# Patient Record
Sex: Female | Born: 1965
Health system: Southern US, Community
[De-identification: ages and names within clinical notes are randomized; demographics above are authoritative.]

## PROBLEM LIST (undated history)

## (undated) DIAGNOSIS — F419 Anxiety disorder, unspecified: Secondary | ICD-10-CM

## (undated) DIAGNOSIS — G43909 Migraine, unspecified, not intractable, without status migrainosus: Secondary | ICD-10-CM

## (undated) DIAGNOSIS — F32A Depression, unspecified: Secondary | ICD-10-CM

## (undated) DIAGNOSIS — F329 Major depressive disorder, single episode, unspecified: Secondary | ICD-10-CM

## (undated) DIAGNOSIS — M419 Scoliosis, unspecified: Secondary | ICD-10-CM

## (undated) DIAGNOSIS — M1990 Unspecified osteoarthritis, unspecified site: Secondary | ICD-10-CM

## (undated) DIAGNOSIS — N951 Menopausal and female climacteric states: Secondary | ICD-10-CM

## (undated) HISTORY — DX: Depression, unspecified: F32.A

## (undated) HISTORY — DX: Menopausal and female climacteric states: N95.1

## (undated) HISTORY — DX: Migraine, unspecified, not intractable, without status migrainosus: G43.909

## (undated) HISTORY — DX: Scoliosis, unspecified: M41.9

## (undated) HISTORY — DX: Anxiety disorder, unspecified: F41.9

## (undated) HISTORY — DX: Major depressive disorder, single episode, unspecified: F32.9

## (undated) HISTORY — PX: KNEE SURGERY: SHX244

## (undated) HISTORY — DX: Unspecified osteoarthritis, unspecified site: M19.90

---

## 2011-07-16 ENCOUNTER — Other Ambulatory Visit: Payer: Self-pay | Admitting: Family Medicine

## 2011-07-23 ENCOUNTER — Other Ambulatory Visit: Payer: Self-pay

## 2011-07-30 ENCOUNTER — Other Ambulatory Visit: Payer: Self-pay

## 2011-07-30 ENCOUNTER — Ambulatory Visit
Admission: RE | Admit: 2011-07-30 | Discharge: 2011-07-30 | Disposition: A | Discharge status: Home / Self Care | Payer: 59 | Source: Ambulatory Visit | Attending: Family Medicine | Admitting: Family Medicine

## 2011-08-06 ENCOUNTER — Ambulatory Visit
Admission: RE | Admit: 2011-08-06 | Discharge: 2011-08-06 | Disposition: A | Discharge status: Home / Self Care | Payer: 59 | Source: Ambulatory Visit | Attending: Family Medicine | Admitting: Family Medicine

## 2011-08-06 ENCOUNTER — Ambulatory Visit: Admission: RE | Admit: 2011-08-06 | Payer: 59 | Source: Ambulatory Visit

## 2013-03-13 ENCOUNTER — Telehealth: Payer: Self-pay | Admitting: Nurse Practitioner

## 2013-03-13 NOTE — Telephone Encounter (Signed)
Patient would like an appointment for a pap smear with Paulene Floor in the morning.

## 2013-03-14 NOTE — Telephone Encounter (Signed)
APPT MADE

## 2013-03-20 ENCOUNTER — Other Ambulatory Visit: Payer: Self-pay | Admitting: Physician Assistant

## 2013-05-17 ENCOUNTER — Other Ambulatory Visit: Payer: Self-pay | Admitting: General Practice

## 2013-05-18 NOTE — Telephone Encounter (Signed)
Last seen 02-28-13

## 2013-05-21 ENCOUNTER — Telehealth: Payer: Self-pay | Admitting: Family Medicine

## 2013-05-21 ENCOUNTER — Telehealth: Payer: Self-pay | Admitting: Nurse Practitioner

## 2013-05-21 MED ORDER — FLUCONAZOLE 150 MG PO TABS: 150.0000 mg | ORAL_TABLET | Freq: Every day | ORAL | Status: DC

## 2013-05-21 MED ORDER — IBUPROFEN 800 MG PO TABS: 800.0000 mg | ORAL_TABLET | Freq: Two times a day (BID) | ORAL | Status: DC | PRN

## 2013-05-21 NOTE — Telephone Encounter (Signed)
rx sent to pharmacy

## 2013-05-21 NOTE — Telephone Encounter (Signed)
Please call in Fluconazole and IBP 800 mg called into [facility] pharmacy

## 2013-05-21 NOTE — Telephone Encounter (Signed)
rx called into pharmacy

## 2013-05-21 NOTE — Telephone Encounter (Signed)
Patient wants diflucan and IBProfen 800mg  called into [facility] pharmacy

## 2013-06-19 ENCOUNTER — Encounter: Payer: Self-pay | Admitting: General Practice

## 2013-06-19 ENCOUNTER — Telehealth: Payer: Self-pay | Admitting: Nurse Practitioner

## 2013-06-19 ENCOUNTER — Ambulatory Visit (INDEPENDENT_AMBULATORY_CARE_PROVIDER_SITE_OTHER): Payer: Medicare Other | Admitting: General Practice

## 2013-06-19 ENCOUNTER — Ambulatory Visit (INDEPENDENT_AMBULATORY_CARE_PROVIDER_SITE_OTHER): Payer: Medicare Other

## 2013-06-19 ENCOUNTER — Telehealth: Payer: Self-pay | Admitting: Family Medicine

## 2013-06-19 DIAGNOSIS — N926 Irregular menstruation, unspecified: Secondary | ICD-10-CM

## 2013-06-19 DIAGNOSIS — R109 Unspecified abdominal pain: Secondary | ICD-10-CM | POA: Diagnosis not present

## 2013-06-19 DIAGNOSIS — K5900 Constipation, unspecified: Secondary | ICD-10-CM

## 2013-06-19 DIAGNOSIS — N390 Urinary tract infection, site not specified: Secondary | ICD-10-CM

## 2013-06-19 DIAGNOSIS — R110 Nausea: Secondary | ICD-10-CM

## 2013-06-19 LAB — POCT URINALYSIS DIPSTICK
Bilirubin, UA: NEGATIVE
Glucose, UA: NEGATIVE
Spec Grav, UA: 1.02
pH, UA: 6.5

## 2013-06-19 LAB — POCT UA - MICROSCOPIC ONLY
Bacteria, U Microscopic: NEGATIVE
Casts, Ur, LPF, POC: NEGATIVE
Crystals, Ur, HPF, POC: NEGATIVE

## 2013-06-19 MED ORDER — ONDANSETRON HCL 4 MG PO TABS: 4.0000 mg | ORAL_TABLET | Freq: Three times a day (TID) | ORAL | Status: DC | PRN

## 2013-06-19 MED ORDER — CIPROFLOXACIN HCL 500 MG PO TABS: 500.0000 mg | ORAL_TABLET | Freq: Two times a day (BID) | ORAL | Status: DC

## 2013-06-19 MED ORDER — NITROFURANTOIN MONOHYD MACRO 100 MG PO CAPS: 100.0000 mg | ORAL_CAPSULE | Freq: Two times a day (BID) | ORAL | Status: DC

## 2013-06-19 NOTE — Progress Notes (Signed)
  Subjective:    Patient ID: Sandra Adams, female    DOB: [birth year], 47 y.o.   MRN: [id number]  HPI Presents today with complain of right side of pelvic pain. She reports this pain onset was 2 to 3 years ago. She reports having a history of fibroids.  She reports being nauseated for past two months. She denies burning or stinging with urination. She denies vaginal discharge.     Review of Systems  Constitutional: Negative for fever and chills.  Respiratory: Negative for chest tightness and shortness of breath.   Cardiovascular: Negative for chest pain and palpitations.  Gastrointestinal: Positive for nausea. Negative for vomiting and blood in stool.  Genitourinary: Negative for dysuria, hematuria and difficulty urinating.  Neurological: Negative for dizziness, weakness and headaches.       Objective:   Physical Exam  Constitutional: She is oriented to person, place, and time. She appears well-developed and well-nourished.  HENT:  Head: Normocephalic and atraumatic.  Cardiovascular: Normal rate, regular rhythm and normal heart sounds.   Pulmonary/Chest: Effort normal and breath sounds normal. No respiratory distress. She exhibits no tenderness.  Abdominal: Soft. Bowel sounds are normal. There is tenderness in the right upper quadrant and right lower quadrant.  Neurological: She is alert and oriented to person, place, and time.  Skin: Skin is warm and dry.  Psychiatric: She has a normal mood and affect.   Results for orders placed in visit on 06/19/13  POCT URINALYSIS DIPSTICK      Result Value Range   Color, UA yellow     Clarity, UA clear     Glucose, UA neg     Bilirubin, UA neg     Ketones, UA neg     Spec Grav, UA 1.020     Blood, UA trace     pH, UA 6.5     Protein, UA trace     Urobilinogen, UA negative     Nitrite, UA neg     Leukocytes, UA Trace    POCT UA - MICROSCOPIC ONLY      Result Value Range   WBC, Ur, HPF, POC 5-10     RBC, urine, microscopic 1-5     Bacteria, U Microscopic neg     Mucus, UA occ     Epithelial cells, urine per micros occ     Crystals, Ur, HPF, POC neg     Casts, Ur, LPF, POC neg     Yeast, UA rare    [facility] reading (PRIMARY) by Coralie Keens, FNP-C, large amount of stool noted in colon.                                        Assessment & Plan:  1. Abdominal  pain, other specified site - POCT urinalysis dipstick - POCT UA - Microscopic Only - DG Abd 1 View; Future  2. Abnormal menses - POCT urine pregnancy  3. Unspecified constipation Miralax 17grams daily, for 1-4 days, until bowel movement  Increase fluid intake (water) Increase fiber in diet (fruits, vegetables, whole grains) Take stool softner daily   4. UTI (urinary tract infection) Increase fluid intake AZO over the counter X2 days Frequent voiding Proper perineal hygiene RTO if symptoms worsen or unresolved Patient verbalized understanding Coralie Keens, FNP-C

## 2013-06-19 NOTE — Telephone Encounter (Signed)
appt made

## 2013-06-19 NOTE — Patient Instructions (Addendum)
Constipation, Adult Constipation is when a person has fewer than 3 bowel movements a week; has difficulty having a bowel movement; or has stools that are dry, hard, or larger than normal. As people grow older, constipation is more common. If you try to fix constipation with medicines that make you have a bowel movement (laxatives), the problem may get worse. Long-term laxative use may cause the muscles of the colon to become weak. A low-fiber diet, not taking in enough fluids, and taking certain medicines may make constipation worse. CAUSES   Certain medicines, such as antidepressants, pain medicine, iron supplements, antacids, and water pills.   Certain diseases, such as diabetes, irritable bowel syndrome (IBS), thyroid disease, or depression.   Not drinking enough water.   Not eating enough fiber-rich foods.   Stress or travel.  Lack of physical activity or exercise.  Not going to the restroom when there is the urge to have a bowel movement.  Ignoring the urge to have a bowel movement.  Using laxatives too much. SYMPTOMS   Having fewer than 3 bowel movements a week.   Straining to have a bowel movement.   Having hard, dry, or larger than normal stools.   Feeling full or bloated.   Pain in the lower abdomen.  Not feeling relief after having a bowel movement. DIAGNOSIS  Your caregiver will take a medical history and perform a physical exam. Further testing may be done for severe constipation. Some tests may include:   A barium enema X-ray to examine your rectum, colon, and sometimes, your small intestine.  A sigmoidoscopy to examine your lower colon.  A colonoscopy to examine your entire colon. TREATMENT  Treatment will depend on the severity of your constipation and what is causing it. Some dietary treatments include drinking more fluids and eating more fiber-rich foods. Lifestyle treatments may include regular exercise. If these diet and lifestyle recommendations  do not help, your caregiver may recommend taking over-the-counter laxative medicines to help you have bowel movements. Prescription medicines may be prescribed if over-the-counter medicines do not work.  HOME CARE INSTRUCTIONS   Increase dietary fiber in your diet, such as fruits, vegetables, whole grains, and beans. Limit high-fat and processed sugars in your diet, such as [place] fries, hamburgers, cookies, candies, and soda.   A fiber supplement may be added to your diet if you cannot get enough fiber from foods.   Drink enough fluids to keep your urine clear or pale yellow.   Exercise regularly or as directed by your caregiver.   Go to the restroom when you have the urge to go. Do not hold it.  Only take medicines as directed by your caregiver. Do not take other medicines for constipation without talking to your caregiver first. SEEK IMMEDIATE MEDICAL CARE IF:   You have bright red blood in your stool.   Your constipation lasts for more than 4 days or gets worse.   You have abdominal or rectal pain.   You have thin, pencil-like stools.  You have unexplained weight loss. MAKE SURE YOU:   Understand these instructions.  Will watch your condition.  Will get help right away if you are not doing well or get worse. Document Released: 09/03/2004 Document Revised: 02/28/2012 Document Reviewed: 11/09/2011 [facility] Patient Information 2014 [place], [place]. Urinary Tract Infection Urinary tract infections (UTIs) can develop anywhere along your urinary tract. Your urinary tract is your body's drainage system for removing wastes and extra water. Your urinary tract includes two kidneys, two ureters,  a bladder, and a urethra. Your kidneys are a pair of bean-shaped organs. Each kidney is about the size of your fist. They are located below your ribs, one on each side of your spine. CAUSES Infections are caused by microbes, which are microscopic organisms, including fungi, viruses, and  bacteria. These organisms are so small that they can only be seen through a microscope. Bacteria are the microbes that most commonly cause UTIs. SYMPTOMS  Symptoms of UTIs may vary by age and gender of the patient and by the location of the infection. Symptoms in young women typically include a frequent and intense urge to urinate and a painful, burning feeling in the bladder or urethra during urination. Older women and men are more likely to be tired, shaky, and weak and have muscle aches and abdominal pain. A fever may mean the infection is in your kidneys. Other symptoms of a kidney infection include pain in your back or sides below the ribs, nausea, and vomiting. DIAGNOSIS To diagnose a UTI, your caregiver will ask you about your symptoms. Your caregiver also will ask to provide a urine sample. The urine sample will be tested for bacteria and white blood cells. White blood cells are made by your body to help fight infection. TREATMENT  Typically, UTIs can be treated with medication. Because most UTIs are caused by a bacterial infection, they usually can be treated with the use of antibiotics. The choice of antibiotic and length of treatment depend on your symptoms and the type of bacteria causing your infection. HOME CARE INSTRUCTIONS  If you were prescribed antibiotics, take them exactly as your caregiver instructs you. Finish the medication even if you feel better after you have only taken some of the medication.  Drink enough water and fluids to keep your urine clear or pale yellow.  Avoid caffeine, tea, and carbonated beverages. They tend to irritate your bladder.  Empty your bladder often. Avoid holding urine for long periods of time.  Empty your bladder before and after sexual intercourse.  After a bowel movement, women should cleanse from front to back. Use each tissue only once. SEEK MEDICAL CARE IF:   You have back pain.  You develop a fever.  Your symptoms do not begin to  resolve within 3 days. SEEK IMMEDIATE MEDICAL CARE IF:   You have severe back pain or lower abdominal pain.  You develop chills.  You have nausea or vomiting.  You have continued burning or discomfort with urination. MAKE SURE YOU:   Understand these instructions.  Will watch your condition.  Will get help right away if you are not doing well or get worse. Document Released: 09/15/2005 Document Revised: 06/06/2012 Document Reviewed: 01/14/2012 [facility] Patient Information 2014 [place], [place].  Miralax 17grams daily, for 1-4 days, until bowel movement  Increase fluid intake (water) Increase fiber in diet (fruits, vegetables, whole grains) Take stool softner daily

## 2013-07-05 ENCOUNTER — Ambulatory Visit: Payer: Medicare Other | Admitting: General Practice

## 2013-07-11 ENCOUNTER — Other Ambulatory Visit: Payer: Self-pay

## 2013-07-11 NOTE — Telephone Encounter (Signed)
Seen 06/19/13  [name]  Patient requesting 2 tablets

## 2013-07-11 NOTE — Telephone Encounter (Signed)
Last seen 06/19/13  Sandra Adams  If approved route to nurse to call in and notify patient

## 2013-07-12 MED ORDER — ALPRAZOLAM 0.25 MG PO TABS: 0.2500 mg | ORAL_TABLET | Freq: Two times a day (BID) | ORAL | Status: DC | PRN

## 2013-07-12 NOTE — Telephone Encounter (Signed)
Please call into pharmacy. thx 

## 2013-07-13 ENCOUNTER — Other Ambulatory Visit: Payer: Self-pay | Admitting: *Deleted

## 2013-07-13 MED ORDER — FLUCONAZOLE 150 MG PO TABS: 150.0000 mg | ORAL_TABLET | Freq: Every day | ORAL | Status: DC

## 2013-07-13 MED ORDER — ALPRAZOLAM 0.25 MG PO TABS: 0.2500 mg | ORAL_TABLET | Freq: Two times a day (BID) | ORAL | Status: DC | PRN

## 2013-07-13 NOTE — Telephone Encounter (Signed)
Patient last seen in office on 7-11. Alprazolam last filled on 09-25-12. Please advise. If approved please have nurse call in to [facility] pharmacy.

## 2013-07-13 NOTE — Telephone Encounter (Signed)
This was done by RX nurse.

## 2013-07-19 ENCOUNTER — Ambulatory Visit: Payer: Medicare Other | Admitting: General Practice

## 2013-09-10 ENCOUNTER — Ambulatory Visit (INDEPENDENT_AMBULATORY_CARE_PROVIDER_SITE_OTHER): Payer: Medicare Other | Admitting: Nurse Practitioner

## 2013-09-10 VITALS — BP 111/76 | HR 78 | Temp 98.3°F | Ht 62.0 in | Wt 130.0 lb

## 2013-09-10 DIAGNOSIS — K5900 Constipation, unspecified: Secondary | ICD-10-CM

## 2013-09-10 DIAGNOSIS — Z01419 Encounter for gynecological examination (general) (routine) without abnormal findings: Secondary | ICD-10-CM

## 2013-09-10 DIAGNOSIS — Z0000 Encounter for general adult medical examination without abnormal findings: Secondary | ICD-10-CM

## 2013-09-10 DIAGNOSIS — Z124 Encounter for screening for malignant neoplasm of cervix: Secondary | ICD-10-CM

## 2013-09-10 LAB — POCT URINALYSIS DIPSTICK
Bilirubin, UA: NEGATIVE
Glucose, UA: NEGATIVE
Ketones, UA: NEGATIVE
Protein, UA: NEGATIVE

## 2013-09-10 LAB — POCT UA - MICROSCOPIC ONLY

## 2013-09-10 NOTE — Patient Instructions (Addendum)
Fibroids Fibroids are lumps (tumors) that can occur any place in a woman's body. These lumps are not cancerous. Fibroids vary in size, weight, and where they grow. HOME CARE  Do not take aspirin.  Write down the number of pads or tampons you use during your period. Tell your doctor. This can help determine the best treatment for you. GET HELP RIGHT AWAY IF:  You have pain in your lower belly (abdomen) that is not helped with medicine.  You have cramps that are not helped with medicine.  You have more bleeding between or during your period.  You feel lightheaded or pass out (faint).  Your lower belly pain gets worse. MAKE SURE YOU:  Understand these instructions.  Will watch your condition.  Will get help right away if you are not doing well or get worse. Document Released: 01/08/2011 Document Revised: 02/28/2012 Document Reviewed: 01/08/2011 [employer] Patient Information 2014 [employer].  

## 2013-09-10 NOTE — Progress Notes (Signed)
Subjective:    Patient ID: Sandra Adams, female    DOB: 1966-06-14, 47 y.o.   MRN: [id number]  HPI Patient here today for CPE and PAP- She is doing well andhas no complaints today.    Review of Systems  Constitutional: Negative.   HENT: Negative.   Eyes: Negative.   Respiratory: Negative.   Cardiovascular: Negative.   Gastrointestinal: Negative.   Endocrine: Negative.   Genitourinary: Negative.   Musculoskeletal: Negative.   Allergic/Immunologic: Negative.   Neurological: Negative.   Hematological: Negative.   Psychiatric/Behavioral: Negative.        Objective:   Physical Exam  Constitutional: She is oriented to person, place, and time. She appears well-developed and well-nourished.  HENT:  Head: Normocephalic.  Right Ear: Hearing, tympanic membrane, external ear and ear canal normal.  Left Ear: Hearing, tympanic membrane, external ear and ear canal normal.  Nose: Nose normal.  Mouth/Throat: Uvula is midline and oropharynx is clear and moist.  Eyes: Conjunctivae and EOM are normal. Pupils are equal, round, and reactive to light.  Neck: Normal range of motion and full passive range of motion without pain. Neck supple. No JVD present. Carotid bruit is not present. No mass and no thyromegaly present.  Cardiovascular: Normal rate, normal heart sounds and intact distal pulses.   No murmur heard. Pulmonary/Chest: Effort normal and breath sounds normal. Right breast exhibits no inverted nipple, no mass, no nipple discharge, no skin change and no tenderness. Left breast exhibits no inverted nipple, no mass, no nipple discharge, no skin change and no tenderness. Breasts are symmetrical.  Abdominal: Soft. Bowel sounds are normal. She exhibits no mass. There is no tenderness.  Dull to percussion  Genitourinary: Vagina normal and uterus normal. No breast swelling, tenderness, discharge or bleeding.  bimanual exam-Palpable ant fibroid no tenderness.  Cervix parous and pink   Musculoskeletal: Normal range of motion.  Lymphadenopathy:    She has no cervical adenopathy.  Neurological: She is alert and oriented to person, place, and time.  Skin: Skin is warm and dry.  Psychiatric: She has a normal mood and affect. Her behavior is normal. Judgment and thought content normal.     BP 111/76  Pulse 78  Temp(Src) 98.3 F (36.8 C) (Oral)  Ht 5\' 2"  (1.575 m)  Wt 130 lb (58.968 kg)  BMI 23.77 kg/m2  Results for orders placed in visit on 09/10/13  POCT UA - MICROSCOPIC ONLY      Result Value Range   WBC, Ur, HPF, POC 20-25     RBC, urine, microscopic 5-10     Bacteria, U Microscopic many     Mucus, UA neg     Epithelial cells, urine per micros occ     Crystals, Ur, HPF, POC neg     Casts, Ur, LPF, POC neg     Yeast, UA neg    POCT URINALYSIS DIPSTICK      Result Value Range   Color, UA yellow     Clarity, UA cloudy     Glucose, UA neg     Bilirubin, UA neg     Ketones, UA neg     Spec Grav, UA 1.025     Blood, UA mod     pH, UA 6.0     Protein, UA neg     Urobilinogen, UA negative     Nitrite, UA pos     Leukocytes, UA Trace          Assessment & Plan:  1.  Encounter for routine gynecological examination  - POCT UA - Microscopic Only - POCT urinalysis dipstick - POCT CBC - CMP14+EGFR - NMR, lipoprofile - Thyroid Panel With TSH  2. PAP (pulmonary alveolar proteinosis)  - Pap IG w/ reflex to HPV when ASC-U  3. Constipation miralx OTC daily Force fluids Increase fiber in diet  Mary-Margaret Daphine Deutscher, FNP

## 2013-09-12 LAB — PAP IG W/ RFLX HPV ASCU

## 2013-09-15 ENCOUNTER — Other Ambulatory Visit: Payer: Self-pay | Admitting: Nurse Practitioner

## 2013-09-15 MED ORDER — NITROFURANTOIN MONOHYD MACRO 100 MG PO CAPS: 100.0000 mg | ORAL_CAPSULE | Freq: Two times a day (BID) | ORAL | Status: DC

## 2013-09-17 ENCOUNTER — Telehealth: Payer: Self-pay

## 2013-09-17 DIAGNOSIS — D219 Benign neoplasm of connective and other soft tissue, unspecified: Secondary | ICD-10-CM

## 2013-09-17 NOTE — Telephone Encounter (Signed)
Wants a referral for a second opinion for her fibroids to [facility] center in [place]

## 2013-09-29 ENCOUNTER — Encounter: Payer: Self-pay | Admitting: General Practice

## 2013-09-29 ENCOUNTER — Ambulatory Visit (INDEPENDENT_AMBULATORY_CARE_PROVIDER_SITE_OTHER): Payer: Medicare Other | Admitting: General Practice

## 2013-09-29 VITALS — BP 118/74 | HR 88 | Temp 97.7°F | Ht 62.0 in | Wt 130.0 lb

## 2013-09-29 DIAGNOSIS — L0390 Cellulitis, unspecified: Secondary | ICD-10-CM

## 2013-09-29 DIAGNOSIS — R21 Rash and other nonspecific skin eruption: Secondary | ICD-10-CM

## 2013-09-29 DIAGNOSIS — L0291 Cutaneous abscess, unspecified: Secondary | ICD-10-CM

## 2013-09-29 MED ORDER — CEPHALEXIN 500 MG PO CAPS: 500.0000 mg | ORAL_CAPSULE | Freq: Three times a day (TID) | ORAL | Status: DC

## 2013-09-29 MED ORDER — TRIAMCINOLONE ACETONIDE 0.025 % EX CREA: TOPICAL_CREAM | Freq: Two times a day (BID) | CUTANEOUS | Status: DC

## 2013-09-29 NOTE — Patient Instructions (Signed)

## 2013-09-29 NOTE — Progress Notes (Signed)
  Subjective:    Patient ID: Sandra Adams, female    DOB: 07-19-1966, 47 y.o.   MRN: [id number]  Rash This is a new problem. The current episode started yesterday. The problem has been gradually worsening since onset. The affected locations include the chest. The rash is characterized by redness and itchiness. She was exposed to a new detergent/soap (? insect bite). Pertinent negatives include no congestion, cough, diarrhea, fatigue, fever, joint pain, shortness of breath, sore throat or vomiting. Past treatments include anti-itch cream. There is no history of allergies or asthma.      Review of Systems  Constitutional: Negative for fever, chills and fatigue.  HENT: Negative for congestion and sore throat.   Respiratory: Negative for cough, chest tightness and shortness of breath.   Cardiovascular: Negative for chest pain.  Gastrointestinal: Negative for vomiting and diarrhea.  Musculoskeletal: Negative for joint pain.  Skin: Positive for rash.       Red rash to right mid chest  Neurological: Negative for dizziness, weakness and headaches.       Objective:   Physical Exam  Constitutional: She is oriented to person, place, and time. She appears well-developed and well-nourished.  Cardiovascular: Normal rate, regular rhythm and normal heart sounds.   Pulmonary/Chest: Effort normal and breath sounds normal.  Neurological: She is alert and oriented to person, place, and time.  Skin: Skin is warm and dry. Rash noted.  Erythematous,maculopapular rash to right chest, underneath breast. 1/2 x 1 inch, negative for warmth  Psychiatric: She has a normal mood and affect.          Assessment & Plan:  1. Cellulitis  - cephALEXin (KEFLEX) 500 MG capsule; Take 1 capsule (500 mg total) by mouth 3 (three) times daily.  Dispense: 30 capsule; Refill: 0  2. Rash and nonspecific skin eruption  - triamcinolone (KENALOG) 0.025 % cream; Apply topically 2 (two) times daily.  Dispense: 30 g; Refill:  0 -keep area clean and dry -may take benadryl OTC as directed -RTO if symptoms worsen or unresolved -Patient verbalized understanding -Coralie Keens, FNP-C

## 2013-10-22 ENCOUNTER — Telehealth: Payer: Self-pay | Admitting: Nurse Practitioner

## 2013-10-22 NOTE — Telephone Encounter (Signed)
No I have not seen patient in while- last 2 visits were with [name]- Will need to be seen

## 2013-10-23 NOTE — Telephone Encounter (Signed)
Appt done

## 2013-10-24 ENCOUNTER — Encounter: Payer: Self-pay | Admitting: General Practice

## 2013-10-24 ENCOUNTER — Ambulatory Visit (INDEPENDENT_AMBULATORY_CARE_PROVIDER_SITE_OTHER): Payer: Medicare Other | Admitting: General Practice

## 2013-10-24 VITALS — BP 98/66 | HR 89 | Temp 98.9°F | Ht 62.0 in | Wt 133.0 lb

## 2013-10-24 DIAGNOSIS — J302 Other seasonal allergic rhinitis: Secondary | ICD-10-CM

## 2013-10-24 DIAGNOSIS — J309 Allergic rhinitis, unspecified: Secondary | ICD-10-CM

## 2013-10-24 MED ORDER — CETIRIZINE HCL 10 MG PO TABS: 10.0000 mg | ORAL_TABLET | Freq: Every day | ORAL | Status: DC

## 2013-10-24 NOTE — Progress Notes (Signed)
  Subjective:    Patient ID: Sandra Adams, female    DOB: 07/12/1966, 47 y.o.   MRN: [id number]  HPI Patient presents today with complaints of post nasal drip, headache, watery eyes, and sneezing at night. She denies OTC medications.     Review of Systems  Constitutional: Negative for fever and chills.  HENT: Positive for postnasal drip and sneezing.   Respiratory: Negative for chest tightness and shortness of breath.   Cardiovascular: Negative for chest pain and palpitations.  Neurological: Positive for headaches. Negative for dizziness and weakness.       Headache at times with sneezing and post nasal drip       Objective:   Physical Exam  Constitutional: She is oriented to person, place, and time. She appears well-developed and well-nourished.  HENT:  Head: Normocephalic and atraumatic.  Right Ear: External ear normal.  Left Ear: External ear normal.  Nose: Right sinus exhibits no maxillary sinus tenderness and no frontal sinus tenderness. Left sinus exhibits no maxillary sinus tenderness and no frontal sinus tenderness.  Mouth/Throat: Posterior oropharyngeal erythema present.  Cardiovascular: Normal rate, regular rhythm and normal heart sounds.   Pulmonary/Chest: Effort normal and breath sounds normal. No respiratory distress. She exhibits no tenderness.  Neurological: She is alert and oriented to person, place, and time.  Skin: Skin is warm and dry.  Psychiatric: She has a normal mood and affect.          Assessment & Plan:  1. Seasonal allergic rhinitis - cetirizine (ZYRTEC) 10 MG tablet; Take 1 tablet (10 mg total) by mouth daily.  Dispense: 30 tablet; Refill: 1 -increase fluids -take medication at night, may cause drowsiness -RTO if symptoms worsen or unresolved -Patient verbalized understanding -Coralie Keens, FNP-C

## 2013-10-24 NOTE — Patient Instructions (Signed)
Allergic Rhinitis Allergic rhinitis is when the mucous membranes in the nose respond to allergens. Allergens are particles in the air that cause your body to have an allergic reaction. This causes you to release allergic antibodies. Through a chain of events, these eventually cause you to release histamine into the blood stream (hence the use of antihistamines). Although meant to be protective to the body, it is this release that causes your discomfort, such as frequent sneezing, congestion and an itchy runny nose.  CAUSES  The pollen allergens may come from grasses, trees, and weeds. This is seasonal allergic rhinitis, or "hay fever." Other allergens cause year-round allergic rhinitis (perennial allergic rhinitis) such as house dust mite allergen, pet dander and mold spores.  SYMPTOMS   Nasal stuffiness (congestion).  Runny, itchy nose with sneezing and tearing of the eyes.  There is often an itching of the mouth, eyes and ears. It cannot be cured, but it can be controlled with medications. DIAGNOSIS  If you are unable to determine the offending allergen, skin or blood testing may find it. TREATMENT   Avoid the allergen.  Medications and allergy shots (immunotherapy) can help.  Hay fever may often be treated with antihistamines in pill or nasal spray forms. Antihistamines block the effects of histamine. There are over-the-counter medicines that may help with nasal congestion and swelling around the eyes. Check with your caregiver before taking or giving this medicine. If the treatment above does not work, there are many new medications your caregiver can prescribe. Stronger medications may be used if initial measures are ineffective. Desensitizing injections can be used if medications and avoidance fails. Desensitization is when a patient is given ongoing shots until the body becomes less sensitive to the allergen. Make sure you follow up with your caregiver if problems continue. SEEK MEDICAL  CARE IF:   You develop fever (more than 100.5 F (38.1 C).  You develop a cough that does not stop easily (persistent).  You have shortness of breath.  You start wheezing.  Symptoms interfere with normal daily activities. Document Released: 08/31/2001 Document Revised: 02/28/2012 Document Reviewed: 03/12/2009 [employer] Patient Information 2014 [employer].  

## 2013-11-09 ENCOUNTER — Other Ambulatory Visit: Payer: Self-pay | Admitting: Family Medicine

## 2013-11-09 ENCOUNTER — Other Ambulatory Visit: Payer: Self-pay | Admitting: Nurse Practitioner

## 2013-11-09 ENCOUNTER — Other Ambulatory Visit: Payer: Self-pay | Admitting: General Practice

## 2013-11-12 NOTE — Telephone Encounter (Signed)
Last seen 11/5   Sandra Adams

## 2013-11-12 NOTE — Telephone Encounter (Signed)
Last seen 10/24/13  [name]

## 2013-11-12 NOTE — Telephone Encounter (Signed)
Last seen 10/24/13  [name]  If approved route to nurse to call into [facility]

## 2013-11-13 ENCOUNTER — Other Ambulatory Visit: Payer: Self-pay | Admitting: General Practice

## 2013-11-14 ENCOUNTER — Encounter: Payer: Self-pay | Admitting: General Practice

## 2013-11-14 ENCOUNTER — Ambulatory Visit (INDEPENDENT_AMBULATORY_CARE_PROVIDER_SITE_OTHER): Payer: Medicare Other | Admitting: General Practice

## 2013-11-14 VITALS — BP 113/65 | HR 77 | Temp 98.3°F | Ht 62.0 in | Wt 132.0 lb

## 2013-11-14 DIAGNOSIS — F411 Generalized anxiety disorder: Secondary | ICD-10-CM

## 2013-11-14 DIAGNOSIS — M25569 Pain in unspecified knee: Secondary | ICD-10-CM

## 2013-11-14 DIAGNOSIS — M25562 Pain in left knee: Secondary | ICD-10-CM

## 2013-11-14 MED ORDER — IBUPROFEN 800 MG PO TABS: 800.0000 mg | ORAL_TABLET | Freq: Two times a day (BID) | ORAL | Status: DC | PRN

## 2013-11-14 MED ORDER — HYDROXYZINE HCL 10 MG PO TABS: 10.0000 mg | ORAL_TABLET | Freq: Every evening | ORAL | Status: DC | PRN

## 2013-11-14 NOTE — Patient Instructions (Signed)
Knee Pain Knee pain can be a result of an injury or other medical conditions. Treatment will depend on the cause of your pain. HOME CARE  Only take medicine as told by your doctor.  Keep a healthy weight. Being overweight can make the knee hurt more.  Stretch before exercising or playing sports.  If there is constant knee pain, change the way you exercise. Ask your doctor for advice.  Make sure shoes fit well. Choose the right shoe for the sport or activity.  Protect your knees. Wear kneepads if needed.  Rest when you are tired. GET HELP RIGHT AWAY IF:   Your knee pain does not stop.  Your knee pain does not get better.  Your knee joint feels hot to the touch.  You have a fever. MAKE SURE YOU:   Understand these instructions.  Will watch this condition.  Will get help right away if you are not doing well or get worse. Document Released: 03/04/2009 Document Revised: 02/28/2012 Document Reviewed: 03/04/2009 [employer] Patient Information 2014 [employer].  

## 2013-11-14 NOTE — Progress Notes (Signed)
   Subjective:    Patient ID: Kennice Finnie, female    DOB: 08-27-1966, 47 y.o.   MRN: [id number]  HPI Patient presents today for follow up of chronic health conditions. She has a history of anxiety and seasonal  allergies. Reports taking medications as directed. She reports xanax makes her very dizzy and would like to try something else. Reports a history of left knee surgery in 1989 and 1993, she is currently taking ibuprofen.  Also reports wanting an antibiotic due to mouth soreness, from filling in tooth coming out. She reports a dentist visit yesterday and pain began afterwards, reports a chipped tooth. Patient reports she will contact dentist office today for follow up.    Review of Systems  Constitutional: Negative for fever and chills.  Respiratory: Negative for chest tightness and shortness of breath.   Cardiovascular: Negative for chest pain and palpitations.  Musculoskeletal:       Left knee pain, worsens with cold weather  Neurological: Negative for dizziness, weakness and headaches.  Psychiatric/Behavioral: Negative for suicidal ideas, sleep disturbance and self-injury. The patient is nervous/anxious.        Periodic anxiety       Objective:   Physical Exam  Constitutional: She is oriented to person, place, and time. She appears well-developed and well-nourished.  HENT:  Head: Normocephalic and atraumatic.  Right Ear: External ear normal.  Left Ear: External ear normal.  Nose: Nose normal.  Mouth/Throat: Oropharynx is clear and moist.  Eyes: EOM are normal. Pupils are equal, round, and reactive to light.  Neck: Normal range of motion. Neck supple. No thyromegaly present.  Cardiovascular: Normal rate, regular rhythm and normal heart sounds.   Pulmonary/Chest: Effort normal and breath sounds normal. No respiratory distress. She exhibits no tenderness.  Abdominal: Soft. Bowel sounds are normal. She exhibits no distension. There is no tenderness.  Musculoskeletal: She  exhibits no edema and no tenderness.  Lymphadenopathy:    She has no cervical adenopathy.  Neurological: She is alert and oriented to person, place, and time.  Skin: Skin is warm and dry.  Psychiatric: She has a normal mood and affect.          Assessment & Plan:  1. Left knee pain  - ibuprofen (ADVIL,MOTRIN) 800 MG tablet; Take 1 tablet (800 mg total) by mouth 2 (two) times daily as needed.  Dispense: 30 tablet; Refill: 0  2. Anxiety state, unspecified   3. GAD (generalized anxiety disorder)  - hydrOXYzine (ATARAX/VISTARIL) 10 MG tablet; Take 1 tablet (10 mg total) by mouth at bedtime as needed.  Dispense: 30 tablet; Refill: 0 -RTO in 3 months for annual physical and labs -RTO prn -will refer to ortho if knee pain worsens or no improvement -Patient verbalized understanding Coralie Keens, FNP-C

## 2013-12-27 ENCOUNTER — Ambulatory Visit (INDEPENDENT_AMBULATORY_CARE_PROVIDER_SITE_OTHER): Payer: Medicare Other | Admitting: Family Medicine

## 2013-12-27 ENCOUNTER — Encounter: Payer: Self-pay | Admitting: Family Medicine

## 2013-12-27 VITALS — BP 109/74 | HR 75 | Temp 97.5°F | Ht 62.0 in | Wt 131.0 lb

## 2013-12-27 DIAGNOSIS — A499 Bacterial infection, unspecified: Secondary | ICD-10-CM

## 2013-12-27 DIAGNOSIS — R300 Dysuria: Secondary | ICD-10-CM

## 2013-12-27 DIAGNOSIS — N898 Other specified noninflammatory disorders of vagina: Secondary | ICD-10-CM

## 2013-12-27 DIAGNOSIS — N760 Acute vaginitis: Secondary | ICD-10-CM

## 2013-12-27 DIAGNOSIS — B9689 Other specified bacterial agents as the cause of diseases classified elsewhere: Secondary | ICD-10-CM

## 2013-12-27 LAB — POCT URINALYSIS DIPSTICK
Bilirubin, UA: NEGATIVE
Glucose, UA: NEGATIVE
Ketones, UA: NEGATIVE
Leukocytes, UA: NEGATIVE
Nitrite, UA: NEGATIVE
Protein, UA: NEGATIVE
Spec Grav, UA: 1.01
Urobilinogen, UA: NEGATIVE
pH, UA: 7.5

## 2013-12-27 LAB — POCT UA - MICROSCOPIC ONLY
Bacteria, U Microscopic: NEGATIVE
Casts, Ur, LPF, POC: NEGATIVE
Crystals, Ur, HPF, POC: NEGATIVE
Mucus, UA: NEGATIVE
Yeast, UA: NEGATIVE

## 2013-12-27 LAB — POCT WET PREP (WET MOUNT)

## 2013-12-27 MED ORDER — TINIDAZOLE 500 MG PO TABS: 2.0000 g | ORAL_TABLET | Freq: Every day | ORAL | Status: DC

## 2013-12-27 MED ORDER — TINIDAZOLE 500 MG PO TABS: ORAL_TABLET | ORAL | Status: DC

## 2013-12-27 NOTE — Progress Notes (Signed)
   Subjective:    Patient ID: Sandra Adams, female    DOB: August 16, 1966, 48 y.o.   MRN: [id number]  HPI This 48 y.o. female presents for evaluation of vaginal discharge for 3 days.  Review of Systems C/o vaginal DC   No chest pain, SOB, HA, dizziness, vision change, N/V, diarrhea, constipation, dysuria, urinary urgency or frequency, myalgias, arthralgias or rash.  Objective:   Physical Exam  Vital signs noted  Well developed well nourished female.  HEENT - Head atraumatic Normocephalic                Eyes - PERRLA, Conjuctiva - clear Sclera- Clear EOMI                Ears - EAC's Wnl TM's Wnl Gross Hearing WNL                Throat - oropharanx wnl Respiratory - Lungs CTA bilateral Cardiac - RRR S1 and S2 without murmur.  Results for orders placed in visit on 12/27/13  POCT UA - MICROSCOPIC ONLY      Result Value Range   WBC, Ur, HPF, POC 1-5     RBC, urine, microscopic 1-5     Bacteria, U Microscopic neg     Mucus, UA neg     Epithelial cells, urine per micros occ     Crystals, Ur, HPF, POC neg     Casts, Ur, LPF, POC neg     Yeast, UA neg    POCT URINALYSIS DIPSTICK      Result Value Range   Color, UA yellow     Clarity, UA cloudy     Glucose, UA neg     Bilirubin, UA neg     Ketones, UA neg     Spec Grav, UA 1.010     Blood, UA trace     pH, UA 7.5     Protein, UA neg     Urobilinogen, UA negative     Nitrite, UA neg     Leukocytes, UA Negative    POCT WET PREP (WET MOUNT)      Result Value Range   Source Wet Prep POC vaginal     WBC, Wet Prep HPF POC 5-10     Bacteria Wet Prep HPF POC mod     Clue Cells Wet Prep HPF POC Moderate     Yeast Wet Prep HPF POC None     Trichomonas Wet Prep HPF POC none          Assessment & Plan:  Dysuria - Plan: POCT UA - Microscopic Only, POCT urinalysis dipstick  Vaginal discharge - Plan: POCT Wet Prep (Wet [place])  Bacterial Vaginosis - Tindamax 2g po qd x 2 days w/1 rf.  Discussed getting probiotics otc for    Vaginal health and to eat yogurt.  Lysbeth Penner FNP

## 2013-12-27 NOTE — Patient Instructions (Signed)
Bacterial Vaginosis Bacterial vaginosis (BV) is a vaginal infection where the normal balance of bacteria in the vagina is disrupted. The normal balance is then replaced by an overgrowth of certain bacteria. There are several different kinds of bacteria that can cause BV. BV is the most common vaginal infection in women of childbearing age. CAUSES   The cause of BV is not fully understood. BV develops when there is an increase or imbalance of harmful bacteria.  Some activities or behaviors can upset the normal balance of bacteria in the vagina and put women at increased risk including:  Having a new sex partner or multiple sex partners.  Douching.  Using an intrauterine device (IUD) for contraception.  It is not clear what role sexual activity plays in the development of BV. However, women that have never had sexual intercourse are rarely infected with BV. Women do not get BV from toilet seats, bedding, swimming pools or from touching objects around them.  SYMPTOMS   Grey vaginal discharge.  A fish-like odor with discharge, especially after sexual intercourse.  Itching or burning of the vagina and vulva.  Burning or pain with urination.  Some women have no signs or symptoms at all. DIAGNOSIS  Your caregiver must examine the vagina for signs of BV. Your caregiver will perform lab tests and look at the sample of vaginal fluid through a microscope. They will look for bacteria and abnormal cells (clue cells), a pH test higher than 4.5, and a positive amine test all associated with BV.  RISKS AND COMPLICATIONS   Pelvic inflammatory disease (PID).  Infections following gynecology surgery.  Developing HIV.  Developing herpes virus. TREATMENT  Sometimes BV will clear up without treatment. However, all women with symptoms of BV should be treated to avoid complications, especially if gynecology surgery is planned. Female partners generally do not need to be treated. However, BV may spread  between female sex partners so treatment is helpful in preventing a recurrence of BV.   BV may be treated with antibiotics. The antibiotics come in either pill or vaginal cream forms. Either can be used with nonpregnant or pregnant women, but the recommended dosages differ. These antibiotics are not harmful to the baby.  BV can recur after treatment. If this happens, a second round of antibiotics will often be prescribed.  Treatment is important for pregnant women. If not treated, BV can cause a premature delivery, especially for a pregnant woman who had a premature birth in the past. All pregnant women who have symptoms of BV should be checked and treated.  For chronic reoccurrence of BV, treatment with a type of prescribed gel vaginally twice a week is helpful. HOME CARE INSTRUCTIONS   Finish all medication as directed by your caregiver.  Do not have sex until treatment is completed.  Tell your sexual partner that you have a vaginal infection. They should see their caregiver and be treated if they have problems, such as a mild rash or itching.  Practice safe sex. Use condoms. Only have 1 sex partner. PREVENTION  Basic prevention steps can help reduce the risk of upsetting the natural balance of bacteria in the vagina and developing BV:  Do not have sexual intercourse (be abstinent).  Do not douche.  Use all of the medicine prescribed for treatment of BV, even if the signs and symptoms go away.  Tell your sex partner if you have BV. That way, they can be treated, if needed, to prevent reoccurrence. SEEK MEDICAL CARE IF:     Your symptoms are not improving after 3 days of treatment.  You have increased discharge, pain, or fever. MAKE SURE YOU:   Understand these instructions.  Will watch your condition.  Will get help right away if you are not doing well or get worse. FOR MORE INFORMATION  Division of STD Prevention (DSTDP), Centers for Disease Control and Prevention:  [employer] [place] (AS[place]): www.ashastd.org  Document Released: 12/06/2005 Document Revised: 02/28/2012 Document Reviewed: 07/18/2013 [facility] Patient Information 2014 [place].

## 2014-01-30 ENCOUNTER — Other Ambulatory Visit: Payer: Self-pay | Admitting: General Practice

## 2014-02-06 ENCOUNTER — Other Ambulatory Visit: Payer: Self-pay | Admitting: Nurse Practitioner

## 2014-02-18 ENCOUNTER — Other Ambulatory Visit: Payer: Self-pay | Admitting: Family Medicine

## 2014-02-20 NOTE — Telephone Encounter (Signed)
Last seen 12/27/13  [facility]

## 2014-02-25 ENCOUNTER — Encounter: Payer: Self-pay | Admitting: General Practice

## 2014-02-25 ENCOUNTER — Ambulatory Visit (INDEPENDENT_AMBULATORY_CARE_PROVIDER_SITE_OTHER): Payer: Medicare Other | Admitting: General Practice

## 2014-02-25 VITALS — BP 102/63 | HR 76 | Temp 97.8°F | Ht 62.0 in | Wt 131.0 lb

## 2014-02-25 DIAGNOSIS — N898 Other specified noninflammatory disorders of vagina: Secondary | ICD-10-CM

## 2014-02-25 DIAGNOSIS — R109 Unspecified abdominal pain: Secondary | ICD-10-CM

## 2014-02-25 DIAGNOSIS — B3731 Acute candidiasis of vulva and vagina: Secondary | ICD-10-CM

## 2014-02-25 DIAGNOSIS — R300 Dysuria: Secondary | ICD-10-CM

## 2014-02-25 DIAGNOSIS — R1030 Lower abdominal pain, unspecified: Secondary | ICD-10-CM

## 2014-02-25 DIAGNOSIS — L293 Anogenital pruritus, unspecified: Secondary | ICD-10-CM

## 2014-02-25 DIAGNOSIS — N912 Amenorrhea, unspecified: Secondary | ICD-10-CM

## 2014-02-25 DIAGNOSIS — N390 Urinary tract infection, site not specified: Secondary | ICD-10-CM

## 2014-02-25 DIAGNOSIS — B373 Candidiasis of vulva and vagina: Secondary | ICD-10-CM

## 2014-02-25 LAB — POCT WET PREP WITH KOH
Clue Cells Wet Prep HPF POC: NEGATIVE
KOH PREP POC: NEGATIVE
Trichomonas, UA: NEGATIVE
Yeast Wet Prep HPF POC: NEGATIVE

## 2014-02-25 LAB — POCT URINALYSIS DIPSTICK
Bilirubin, UA: NEGATIVE
GLUCOSE UA: NEGATIVE
Ketones, UA: NEGATIVE
NITRITE UA: NEGATIVE
RBC UA: NEGATIVE
SPEC GRAV UA: 1.025
Urobilinogen, UA: NEGATIVE
pH, UA: 6

## 2014-02-25 LAB — POCT URINE PREGNANCY: Preg Test, Ur: NEGATIVE

## 2014-02-25 LAB — POCT UA - MICROSCOPIC ONLY
Casts, Ur, LPF, POC: NEGATIVE
Crystals, Ur, HPF, POC: NEGATIVE
MUCUS UA: NEGATIVE
YEAST UA: NEGATIVE

## 2014-02-25 MED ORDER — NITROFURANTOIN MONOHYD MACRO 100 MG PO CAPS: 100.0000 mg | ORAL_CAPSULE | Freq: Two times a day (BID) | ORAL | Status: DC

## 2014-02-25 MED ORDER — FLUCONAZOLE 150 MG PO TABS: 150.0000 mg | ORAL_TABLET | Freq: Once | ORAL | Status: DC

## 2014-02-25 NOTE — Patient Instructions (Signed)
Urinary Tract Infection  Urinary tract infections (UTIs) can develop anywhere along your urinary tract. Your urinary tract is your body's drainage system for removing wastes and extra water. Your urinary tract includes two kidneys, two ureters, a bladder, and a urethra. Your kidneys are a pair of bean-shaped organs. Each kidney is about the size of your fist. They are located below your ribs, one on each side of your spine.  CAUSES  Infections are caused by microbes, which are microscopic organisms, including fungi, viruses, and bacteria. These organisms are so small that they can only be seen through a microscope. Bacteria are the microbes that most commonly cause UTIs.  SYMPTOMS   Symptoms of UTIs may vary by age and gender of the patient and by the location of the infection. Symptoms in young women typically include a frequent and intense urge to urinate and a painful, burning feeling in the bladder or urethra during urination. Older women and men are more likely to be tired, shaky, and weak and have muscle aches and abdominal pain. A fever may mean the infection is in your kidneys. Other symptoms of a kidney infection include pain in your back or sides below the ribs, nausea, and vomiting.  DIAGNOSIS  To diagnose a UTI, your caregiver will ask you about your symptoms. Your caregiver also will ask to provide a urine sample. The urine sample will be tested for bacteria and white blood cells. White blood cells are made by your body to help fight infection.  TREATMENT   Typically, UTIs can be treated with medication. Because most UTIs are caused by a bacterial infection, they usually can be treated with the use of antibiotics. The choice of antibiotic and length of treatment depend on your symptoms and the type of bacteria causing your infection.  HOME CARE INSTRUCTIONS   If you were prescribed antibiotics, take them exactly as your caregiver instructs you. Finish the medication even if you feel better after you  have only taken some of the medication.   Drink enough water and fluids to keep your urine clear or pale yellow.   Avoid caffeine, tea, and carbonated beverages. They tend to irritate your bladder.   Empty your bladder often. Avoid holding urine for long periods of time.   Empty your bladder before and after sexual intercourse.   After a bowel movement, women should cleanse from front to back. Use each tissue only once.  SEEK MEDICAL CARE IF:    You have back pain.   You develop a fever.   Your symptoms do not begin to resolve within 3 days.  SEEK IMMEDIATE MEDICAL CARE IF:    You have severe back pain or lower abdominal pain.   You develop chills.   You have nausea or vomiting.   You have continued burning or discomfort with urination.  MAKE SURE YOU:    Understand these instructions.   Will watch your condition.   Will get help right away if you are not doing well or get worse.  Document Released: 09/15/2005 Document Revised: 06/06/2012 Document Reviewed: 01/14/2012  [employer] Patient Information 2014 [employer].

## 2014-02-25 NOTE — Progress Notes (Signed)
Subjective:    Patient ID: Sandra Adams, female    DOB: December 07, 1966, 48 y.o.   MRN: [id number]  Vaginal Itching The patient's primary symptoms include a missed menses and a vaginal discharge. The patient's pertinent negatives include no genital odor or vaginal bleeding. This is a recurrent problem. The problem occurs intermittently. The problem has been gradually worsening. Associated symptoms include abdominal pain, dysuria and frequency. Pertinent negatives include no chills, constipation, diarrhea, fever, headaches, painful intercourse, urgency or vomiting. The vaginal discharge was thick and white. She has not been passing clots. She has not been passing tissue. Nothing aggravates the symptoms. She is sexually active. It is unknown whether or not her partner has an STD. She uses nothing for contraception. There is no history of an abdominal surgery, miscarriage or an STD.      Review of Systems  Constitutional: Negative for fever and chills.  Respiratory: Negative for chest tightness and shortness of breath.   Cardiovascular: Negative for chest pain and palpitations.  Gastrointestinal: Positive for abdominal pain. Negative for vomiting, diarrhea and constipation.  Genitourinary: Positive for dysuria, frequency, vaginal discharge and missed menses. Negative for urgency.  Neurological: Negative for dizziness, weakness and headaches.       Objective:   Physical Exam  Constitutional: She is oriented to person, place, and time. She appears well-developed and well-nourished.  Cardiovascular: Normal rate, regular rhythm and normal heart sounds.   Pulmonary/Chest: Effort normal and breath sounds normal. No respiratory distress. She exhibits no tenderness.  Abdominal: Soft. Bowel sounds are normal. She exhibits no distension. There is no tenderness.  Neurological: She is alert and oriented to person, place, and time.  Skin: Skin is warm and dry.    Results for orders placed in visit on  02/25/14  POCT UA - MICROSCOPIC ONLY      Result Value Ref Range   WBC, Ur, HPF, POC 10-15     RBC, urine, microscopic 1-3     Bacteria, U Microscopic moderate     Mucus, UA negative     Epithelial cells, urine per micros moderate     Crystals, Ur, HPF, POC negative     Casts, Ur, LPF, POC negative     Yeast, UA negative    POCT URINALYSIS DIPSTICK      Result Value Ref Range   Color, UA gold     Clarity, UA clear     Glucose, UA negative     Bilirubin, UA negative     Ketones, UA negative     Spec Grav, UA 1.025     Blood, UA negative     pH, UA 6.0     Protein, UA 2+     Urobilinogen, UA negative     Nitrite, UA negative     Leukocytes, UA moderate (2+)    POCT URINE PREGNANCY      Result Value Ref Range   Preg Test, Ur Negative    POCT WET PREP WITH KOH      Result Value Ref Range   Trichomonas, UA Negative     Clue Cells Wet Prep HPF POC negative     Epithelial Wet Prep HPF POC moderate     Yeast Wet Prep HPF POC negative     Bacteria Wet Prep HPF POC moderate     RBC Wet Prep HPF POC 1-3     WBC Wet Prep HPF POC 5-10     KOH Prep POC Negative  Assessment & Plan:  1. Vaginal discharge, 2. Vaginal itching  - POCT Wet Prep with KOH  3. Amenorrhea  - POCT urine pregnancy  4. Dysuria  - POCT UA - Microscopic Only - POCT urinalysis dipstick  5. Lower abdominal pain   6. UTI (urinary tract infection)  - nitrofurantoin, macrocrystal-monohydrate, (MACROBID) 100 MG capsule; Take 1 capsule (100 mg total) by mouth 2 (two) times daily.  Dispense: 20 capsule; Refill: 0  7. Vulvovaginal candidiasis  - fluconazole (DIFLUCAN) 150 MG tablet; Take 1 tablet (150 mg total) by mouth once.  Dispense: 1 tablet; Refill: 0 -Increase fluid intake AZO over the counter X2 days Frequent voiding Proper perineal hygiene RTO prn Patient verbalized understanding Erby Pian, FNP-C

## 2014-03-04 ENCOUNTER — Telehealth: Payer: Self-pay | Admitting: General Practice

## 2014-03-07 NOTE — Telephone Encounter (Signed)
Patient wants a referral to neurologist for headaches. Patient aware since we havent seen her for headaches that she would need to be seen

## 2014-03-14 ENCOUNTER — Ambulatory Visit (INDEPENDENT_AMBULATORY_CARE_PROVIDER_SITE_OTHER): Payer: Medicare Other

## 2014-03-14 ENCOUNTER — Ambulatory Visit: Payer: Medicare Other

## 2014-03-14 ENCOUNTER — Ambulatory Visit (INDEPENDENT_AMBULATORY_CARE_PROVIDER_SITE_OTHER): Payer: Medicare Other | Admitting: Family Medicine

## 2014-03-14 VITALS — BP 98/66 | HR 94 | Temp 98.1°F | Ht 62.0 in | Wt 133.6 lb

## 2014-03-14 DIAGNOSIS — N951 Menopausal and female climacteric states: Secondary | ICD-10-CM

## 2014-03-14 DIAGNOSIS — F32A Depression, unspecified: Secondary | ICD-10-CM

## 2014-03-14 DIAGNOSIS — N912 Amenorrhea, unspecified: Secondary | ICD-10-CM

## 2014-03-14 DIAGNOSIS — F3289 Other specified depressive episodes: Secondary | ICD-10-CM

## 2014-03-14 DIAGNOSIS — F329 Major depressive disorder, single episode, unspecified: Secondary | ICD-10-CM

## 2014-03-14 DIAGNOSIS — K219 Gastro-esophageal reflux disease without esophagitis: Secondary | ICD-10-CM

## 2014-03-14 DIAGNOSIS — R5381 Other malaise: Secondary | ICD-10-CM

## 2014-03-14 DIAGNOSIS — R0602 Shortness of breath: Secondary | ICD-10-CM

## 2014-03-14 DIAGNOSIS — R232 Flushing: Secondary | ICD-10-CM

## 2014-03-14 DIAGNOSIS — Z8669 Personal history of other diseases of the nervous system and sense organs: Secondary | ICD-10-CM

## 2014-03-14 DIAGNOSIS — R51 Headache: Secondary | ICD-10-CM

## 2014-03-14 DIAGNOSIS — R5383 Other fatigue: Secondary | ICD-10-CM

## 2014-03-14 MED ORDER — SERTRALINE HCL 50 MG PO TABS
50.0000 mg | ORAL_TABLET | Freq: Every day | ORAL | Status: DC
Start: 2014-03-14 — End: 2014-04-05

## 2014-03-14 MED ORDER — SUMATRIPTAN SUCCINATE 100 MG PO TABS: 100.0000 mg | ORAL_TABLET | ORAL | Status: DC | PRN

## 2014-03-14 MED ORDER — CLONIDINE HCL 0.1 MG PO TABS: ORAL_TABLET | ORAL | Status: DC

## 2014-03-14 MED ORDER — OMEPRAZOLE 20 MG PO CPDR: 20.0000 mg | DELAYED_RELEASE_CAPSULE | Freq: Every day | ORAL | Status: DC

## 2014-03-14 NOTE — Progress Notes (Signed)
   Subjective:    Patient ID: Sandra Adams, female    DOB: [birth year], 48 y.o.   MRN: [id number]  HPI  This 48 y.o. female presents for evaluation of headaches.  She states she gets headaches That occur every 2 months and they occur on the right parietal region and has migraine Qualities.  She has headaches since she was teenager.  She wants referral. She has been taking tylenol #3 for headaches.  Review of Systems C/o headaches No chest pain, SOB, HA, dizziness, vision change, N/V, diarrhea, constipation, dysuria, urinary urgency or frequency, myalgias, arthralgias or rash.     Objective:   Physical Exam  Vital signs noted  Well developed well nourished female.  HEENT - Head atraumatic Normocephalic                Eyes - PERRLA, Conjuctiva - clear Sclera- Clear EOMI                Ears - EAC's Wnl TM's Wnl Gross Hearing WNL                Nose - Nares patent                 Throat - oropharanx wnl Respiratory - Lungs CTA bilateral Cardiac - RRR S1 and S2 without murmur GI - Abdomen soft Nontender and bowel sounds active x 4 Extremities - No edema. Neuro - Grossly intact.  Skull series and sinus series - NAD CXR - NAD Prelimnary reading by Iverson Alamin    Assessment & Plan:  Hx of migraine headaches - Plan: Ambulatory referral to Neurology, SUMAtriptan (IMITREX) 100 MG tablet, POCT CBC, CMP14+EGFR, Lipid panel, Vitamin B12, Thyroid Panel With TSH, Vit D  25 hydroxy (rtn osteoporosis monitoring), DG Sinuses Complete, CANCELED: DG Skull 1-3 Views  GERD (gastroesophageal reflux disease) - Plan: omeprazole (PRILOSEC) 20 MG capsule  Other malaise and fatigue - Plan: POCT CBC, CMP14+EGFR, Lipid panel, Vitamin B12, Thyroid Panel With TSH, Vit D  25 hydroxy (rtn osteoporosis monitoring), POCT urinalysis dipstick, POCT UA - Microscopic Only  Amenorrhea - Plan: Follicle Stimulating Hormone, POCT urine pregnancy  Hot flashes - Plan: cloNIDine (CATAPRES) 0.1 MG  tablet  Depression - Plan: sertraline (ZOLOFT) 50 MG tablet  SOB (shortness of breath) - Plan: DG Chest 2 View  Lysbeth Penner FNP

## 2014-03-27 ENCOUNTER — Ambulatory Visit (INDEPENDENT_AMBULATORY_CARE_PROVIDER_SITE_OTHER): Payer: Medicare Other | Admitting: Neurology

## 2014-03-27 ENCOUNTER — Encounter: Payer: Self-pay | Admitting: Neurology

## 2014-03-27 ENCOUNTER — Encounter (INDEPENDENT_AMBULATORY_CARE_PROVIDER_SITE_OTHER): Payer: Self-pay

## 2014-03-27 VITALS — BP 138/85 | HR 91 | Ht 63.0 in | Wt 134.0 lb

## 2014-03-27 DIAGNOSIS — R51 Headache: Secondary | ICD-10-CM

## 2014-03-27 DIAGNOSIS — G43009 Migraine without aura, not intractable, without status migrainosus: Secondary | ICD-10-CM | POA: Insufficient documentation

## 2014-03-27 NOTE — Progress Notes (Signed)
[facility]    Provider:  Dr Janann Colonel Referring Provider: Lysbeth Penner, FNP Primary Care Physician:  Redge Gainer, MD  CC: headaches  HPI:  Sandra Adams is a 48 y.o. female here as a referral from Dr. Sallyanne Havers for headache evaluation  Reports having headaches since she was a teenager. Reports they are typically located in the right temporal region. Currently occuring around the time of her monthly cycle. Is undergoing menopause. Lasts hours to all day. Pain described as a unilateral pounding type pain. Notes tenderness around her eyes during the headaches. Notes some blurry vision both with and without the headache. + Nausea and emesis, +photo and phonophobia. No focal motor or sensory changes.   States her mother had a brain tumor and she has concern that she may have one too  Review of Systems: Out of a complete 14 system review, the patient complains of only the following symptoms, and all other reviewed systems are negative. + blurred vision, shortness of breath, feeling hot, joint pain, aching muscles, spinning sensation  History   Social History  . Marital Status: Divorced    Spouse Name: N/A    Number of Children: N/A  . Years of Education: N/A   Occupational History  . Not on file.   Social History Main Topics  . Smoking status: Never Smoker   . Smokeless tobacco: Not on file  . Alcohol Use: No  . Drug Use: No  . Sexual Activity: Not on file   Other Topics Concern  . Not on file   Social History Narrative   Single,no children   Right handed   12 th grade   No caffeine    Family History  Problem Relation Age of Onset  . Epilepsy Mother   . COPD Father   . COPD Sister   . COPD Sister     Past Medical History  Diagnosis Date  . Anxiety   . Depression   . Migraine   . Scoliosis     Past Surgical History  Procedure Laterality Date  . Knee surgery      Current Outpatient Prescriptions  Medication Sig Dispense Refill  .  acetaminophen-codeine (TYLENOL #3) 300-30 MG per tablet Take 1 tablet by mouth every 6 (six) hours as needed for moderate pain.      . cloNIDine (CATAPRES) 0.1 MG tablet One po qhs prn hot flashes  30 tablet  11  . hydrOXYzine (ATARAX/VISTARIL) 10 MG tablet Take 1 tablet (10 mg total) by mouth at bedtime as needed.  30 tablet  0  . ibuprofen (ADVIL,MOTRIN) 800 MG tablet TAKE  (1)  TABLET TWICE A DAY AS NEEDED.  60 tablet  1  . nitrofurantoin, macrocrystal-monohydrate, (MACROBID) 100 MG capsule Take 1 capsule (100 mg total) by mouth 2 (two) times daily.  20 capsule  0  . omeprazole (PRILOSEC) 20 MG capsule Take 1 capsule (20 mg total) by mouth daily.  30 capsule  3  . sertraline (ZOLOFT) 50 MG tablet Take 1 tablet (50 mg total) by mouth daily.  30 tablet  11  . SUMAtriptan (IMITREX) 100 MG tablet Take 1 tablet (100 mg total) by mouth every 2 (two) hours as needed for migraine or headache. May repeat in 2 hours if headache persists or recurs.  10 tablet  0  . tinidazole (TINDAMAX) 500 MG tablet Take 4 tablets po qd with meal x 2 days for vaginal discharge  8 tablet  1   No current facility-administered  medications for this visit.    Allergies as of 03/27/2014 - Review Complete 03/27/2014  Allergen Reaction Noted  . Prednisone Nausea And Vomiting 06/19/2013  . Tylenol [acetaminophen]  06/19/2013  . Vicodin [hydrocodone-acetaminophen]  06/19/2013    Vitals: BP 138/85  Pulse 91  Ht 5\' 3"  (1.6 m)  Wt 134 lb (60.782 kg)  BMI 23.74 kg/m2 Last Weight:  Wt Readings from Last 1 Encounters:  03/27/14 134 lb (60.782 kg)   Last Height:   Ht Readings from Last 1 Encounters:  03/27/14 5\' 3"  (1.6 m)     Physical exam: Exam: Gen: NAD, conversant Eyes: anicteric sclerae, moist conjunctivae HENT: Atraumatic, oropharynx clear Neck: Trachea midline; supple,  Lungs: CTA, no wheezing, rales, rhonic                          CV: RRR, no MRG Abdomen: Soft, non-tender;  Extremities: No peripheral  edema  Skin: Normal temperature, no rash,  Psych: Appropriate affect, pleasant  Neuro: MS: AA&Ox3, appropriately interactive, normal affect   Speech: fluent w/o paraphasic error  Memory: good recent and remote recall  CN: PERRL, EOMI no nystagmus, no ptosis, sensation intact to LT V1-V3 bilat, face symmetric, no weakness, hearing grossly intact, palate elevates symmetrically, shoulder shrug 5/5 bilat,  tongue protrudes midline, no fasiculations noted.  Motor: normal bulk and tone Strength: 5/5  In all extremities  Coord: rapid alternating and point-to-point (FNF, HTS) movements intact.  Reflexes: symmetrical, bilat downgoing toes  Sens: LT intact in all extremities  Gait: posture, stance, stride and arm-swing normal. Tandem gait intact. Able to walk on heels and toes. Romberg absent.   Assessment:  After physical and neurologic examination, review of laboratory studies, imaging, neurophysiology testing and pre-existing records, assessment will be reviewed on the problem list.  Plan:  Treatment plan and additional workup will be reviewed under Problem List.  1)Migraine headache  47y/o woman presenting for initial evaluation of headaches consistent with a diagnosis of migraine without aura. Physical exam is overall unremarkable. Due to progressive nature will check head CT. Continue on Imitrex as needed for now. Follow up once head CT completed.   Jim Like, DO  [facility] [address] [place], [place] [address]  Phone [phone] Fax [phone]

## 2014-03-27 NOTE — Patient Instructions (Addendum)
Overall you are doing fairly well but I do want to suggest a few things today:   As far as your medications are concerned, I would like to suggest you continue to use the Imitrex as needed for breakthrough headaches  I would like you to have a head CT  Follow up as needed. Please call us with any interim questions, concerns, problems, updates or refill requests.   Please also call us for any test results so we can go over those with you on the phone.  My clinical assistant and will answer any of your questions and relay your messages to me and also relay most of my messages to you.   Our phone number is [phone]. We also have an after hours call service for urgent matters and there is a physician on-call for urgent questions. For any emergencies you know to call 911 or go to the nearest emergency room

## 2014-03-28 ENCOUNTER — Other Ambulatory Visit: Payer: Self-pay

## 2014-03-28 ENCOUNTER — Other Ambulatory Visit: Payer: Self-pay | Admitting: Neurology

## 2014-03-28 DIAGNOSIS — R51 Headache: Secondary | ICD-10-CM

## 2014-04-02 ENCOUNTER — Ambulatory Visit: Payer: 59 | Attending: Orthopedic Surgery | Admitting: Physical Therapy

## 2014-04-02 DIAGNOSIS — M6281 Muscle weakness (generalized): Secondary | ICD-10-CM | POA: Insufficient documentation

## 2014-04-02 DIAGNOSIS — R5381 Other malaise: Secondary | ICD-10-CM | POA: Insufficient documentation

## 2014-04-02 DIAGNOSIS — M25569 Pain in unspecified knee: Secondary | ICD-10-CM | POA: Insufficient documentation

## 2014-04-02 DIAGNOSIS — M545 Low back pain, unspecified: Secondary | ICD-10-CM | POA: Insufficient documentation

## 2014-04-02 DIAGNOSIS — IMO0001 Reserved for inherently not codable concepts without codable children: Secondary | ICD-10-CM | POA: Insufficient documentation

## 2014-04-04 ENCOUNTER — Encounter: Payer: 59 | Admitting: Physical Therapy

## 2014-04-05 ENCOUNTER — Encounter: Payer: Self-pay | Admitting: Family Medicine

## 2014-04-05 ENCOUNTER — Ambulatory Visit (INDEPENDENT_AMBULATORY_CARE_PROVIDER_SITE_OTHER): Payer: Medicare Other | Admitting: Family Medicine

## 2014-04-05 VITALS — BP 103/61 | HR 76 | Temp 98.3°F | Ht 62.0 in | Wt 132.4 lb

## 2014-04-05 DIAGNOSIS — F329 Major depressive disorder, single episode, unspecified: Secondary | ICD-10-CM

## 2014-04-05 DIAGNOSIS — J069 Acute upper respiratory infection, unspecified: Secondary | ICD-10-CM

## 2014-04-05 DIAGNOSIS — R51 Headache: Secondary | ICD-10-CM

## 2014-04-05 DIAGNOSIS — M25569 Pain in unspecified knee: Secondary | ICD-10-CM

## 2014-04-05 DIAGNOSIS — F3289 Other specified depressive episodes: Secondary | ICD-10-CM

## 2014-04-05 DIAGNOSIS — F32A Depression, unspecified: Secondary | ICD-10-CM

## 2014-04-05 MED ORDER — SERTRALINE HCL 50 MG PO TABS: 50.0000 mg | ORAL_TABLET | Freq: Every day | ORAL | Status: DC

## 2014-04-05 MED ORDER — ALPRAZOLAM 0.25 MG PO TABS: ORAL_TABLET | ORAL | Status: DC

## 2014-04-05 MED ORDER — IBUPROFEN 800 MG PO TABS: 800.0000 mg | ORAL_TABLET | Freq: Three times a day (TID) | ORAL | Status: DC

## 2014-04-05 MED ORDER — AMOXICILLIN 875 MG PO TABS: 875.0000 mg | ORAL_TABLET | Freq: Two times a day (BID) | ORAL | Status: DC

## 2014-04-05 NOTE — Progress Notes (Signed)
   Subjective:    Patient ID: Sandra Adams, female    DOB: [birth year], 48 y.o.   MRN: [id number]  HPI This 48 y.o. female presents for evaluation of knee pain, headaches, and needs refills On her sertraline for depression.  She was seen a few weeks ago and she had hot flashes Numbness tingling, and headaches.  She was referred to neurology.  She is pending CT of head. She c/o claustrophobia and wants some nerve pills for her CT.  She had labs ordered and didn't want To get the labs because she had labs done in ED for her headaches and she states they were normal.  She wants refills on motrin.  She was wondering if she can get a pain medicine called hydrocodone.   Review of Systems No chest pain, SOB, HA, dizziness, vision change, N/V, diarrhea, constipation, dysuria, urinary urgency or frequency, myalgias, arthralgias or rash.     Objective:   Physical Exam Vital signs noted  Well developed well nourished female.  HEENT - Head atraumatic Normocephalic                Eyes - PERRLA, Conjuctiva - clear Sclera- Clear EOMI                Ears - EAC's Wnl TM's Wnl Gross Hearing WNL                Nose - Nares patent                 Throat - oropharanx wnl Respiratory - Lungs CTA bilateral Cardiac - RRR S1 and S2 without murmur GI - Abdomen soft Nontender and bowel sounds active x 4 Extremities - No edema. Neuro - Grossly intact.       Assessment & Plan:  Depression - Plan: sertraline (ZOLOFT) 50 MG tablet  URI (upper respiratory infection) - Plan: amoxicillin (AMOXIL) 875 MG tablet  Knee pain - Plan: ibuprofen (ADVIL,MOTRIN) 800 MG tablet  Headache(784.0) - Plan: ALPRAZolam (XANAX) 0.25 MG tablet.  Discussed with patient she should take this an hour prior to her CT appointment and have someone drive her.  Lysbeth Penner FNP

## 2014-04-08 ENCOUNTER — Ambulatory Visit: Payer: 59 | Admitting: Physical Therapy

## 2014-04-08 ENCOUNTER — Other Ambulatory Visit: Payer: 59

## 2014-04-08 DIAGNOSIS — IMO0001 Reserved for inherently not codable concepts without codable children: Secondary | ICD-10-CM | POA: Diagnosis not present

## 2014-04-10 ENCOUNTER — Encounter: Payer: 59 | Admitting: Physical Therapy

## 2014-04-17 ENCOUNTER — Ambulatory Visit
Admission: RE | Admit: 2014-04-17 | Discharge: 2014-04-17 | Disposition: A | Discharge status: Home / Self Care | Payer: 59 | Source: Ambulatory Visit | Attending: Neurology | Admitting: Neurology

## 2014-04-17 DIAGNOSIS — R51 Headache: Secondary | ICD-10-CM

## 2014-04-19 ENCOUNTER — Encounter: Payer: Self-pay | Admitting: *Deleted

## 2014-04-19 NOTE — Progress Notes (Signed)
Quick Note:  I relayed normal MRI brain to pt. She verbalized understanding. Mailed copy to her and route to Dr. Laurance Flatten, pcp office. ______

## 2014-04-22 ENCOUNTER — Other Ambulatory Visit: Payer: 59

## 2014-04-22 ENCOUNTER — Ambulatory Visit: Payer: PRIVATE HEALTH INSURANCE | Attending: Orthopedic Surgery | Admitting: Physical Therapy

## 2014-04-22 DIAGNOSIS — M6281 Muscle weakness (generalized): Secondary | ICD-10-CM | POA: Diagnosis not present

## 2014-04-22 DIAGNOSIS — M545 Low back pain, unspecified: Secondary | ICD-10-CM | POA: Diagnosis not present

## 2014-04-22 DIAGNOSIS — R5381 Other malaise: Secondary | ICD-10-CM | POA: Diagnosis not present

## 2014-04-22 DIAGNOSIS — M25569 Pain in unspecified knee: Secondary | ICD-10-CM | POA: Diagnosis not present

## 2014-04-22 DIAGNOSIS — IMO0001 Reserved for inherently not codable concepts without codable children: Secondary | ICD-10-CM | POA: Insufficient documentation

## 2014-04-24 ENCOUNTER — Ambulatory Visit: Payer: PRIVATE HEALTH INSURANCE | Admitting: Physical Therapy

## 2014-04-24 DIAGNOSIS — IMO0001 Reserved for inherently not codable concepts without codable children: Secondary | ICD-10-CM | POA: Diagnosis not present

## 2014-04-29 ENCOUNTER — Ambulatory Visit: Payer: PRIVATE HEALTH INSURANCE | Admitting: Physical Therapy

## 2014-04-29 DIAGNOSIS — IMO0001 Reserved for inherently not codable concepts without codable children: Secondary | ICD-10-CM | POA: Diagnosis not present

## 2014-05-03 ENCOUNTER — Telehealth: Payer: Self-pay | Admitting: Family Medicine

## 2014-05-03 ENCOUNTER — Ambulatory Visit (INDEPENDENT_AMBULATORY_CARE_PROVIDER_SITE_OTHER): Payer: Medicare Other | Admitting: Physician Assistant

## 2014-05-03 ENCOUNTER — Ambulatory Visit: Payer: PRIVATE HEALTH INSURANCE | Admitting: Physical Therapy

## 2014-05-03 ENCOUNTER — Encounter: Payer: Self-pay | Admitting: Physician Assistant

## 2014-05-03 VITALS — BP 106/72 | HR 68 | Temp 98.6°F | Ht 62.0 in | Wt 132.2 lb

## 2014-05-03 DIAGNOSIS — N946 Dysmenorrhea, unspecified: Secondary | ICD-10-CM

## 2014-05-03 DIAGNOSIS — K219 Gastro-esophageal reflux disease without esophagitis: Secondary | ICD-10-CM

## 2014-05-03 DIAGNOSIS — IMO0001 Reserved for inherently not codable concepts without codable children: Secondary | ICD-10-CM | POA: Diagnosis not present

## 2014-05-03 NOTE — Progress Notes (Signed)
Subjective:     Patient ID: Sandra Adams, female   DOB: 1966/01/02, 48 y.o.   MRN: [id number]  HPI Pt with multiple complaints #1- Hx of fibroid Pt has noticed recent increase and heavier menses She is very concerned about the fibroid may have increased in size #2- She has continued with GERD like sx Seen prev here for same ~ 2 weeks ago She was started on Prilosec 20mg  Pt has a very large caff intake Denies any smoking or caff use  Review of Systems She denies N/V No change in bowel habits and denies constipation Denies any urinary sx +reflux following meals Denies any change in weight + intermit dysphagia    Objective:   Physical Exam Vitals reviewed Heart- RRR w/o M Lungs- CTA Abd- BS norm, obese, soft, sl TTP epigastric, no masses/HSM No CVAT    Assessment:     GERD Dysmenorrhea    Plan:     Decrease of caff intake Stay upright 20 min after eating Increase Prilosec to bid If sx cont refer to GI Given hx of fibroid and increase in menses  With heavier flow pt referred to Gyn F/U prn

## 2014-05-03 NOTE — Patient Instructions (Signed)

## 2014-05-03 NOTE — Telephone Encounter (Signed)
appt made

## 2014-05-06 ENCOUNTER — Ambulatory Visit: Payer: PRIVATE HEALTH INSURANCE | Admitting: Physical Therapy

## 2014-05-06 DIAGNOSIS — IMO0001 Reserved for inherently not codable concepts without codable children: Secondary | ICD-10-CM | POA: Diagnosis not present

## 2014-05-09 ENCOUNTER — Ambulatory Visit: Payer: PRIVATE HEALTH INSURANCE | Admitting: *Deleted

## 2014-05-15 ENCOUNTER — Encounter: Payer: 59 | Admitting: Physical Therapy

## 2014-05-16 ENCOUNTER — Encounter: Payer: 59 | Admitting: Physical Therapy

## 2014-05-20 ENCOUNTER — Encounter: Payer: Self-pay | Admitting: Obstetrics & Gynecology

## 2014-05-20 ENCOUNTER — Ambulatory Visit (INDEPENDENT_AMBULATORY_CARE_PROVIDER_SITE_OTHER): Payer: PRIVATE HEALTH INSURANCE | Admitting: Obstetrics & Gynecology

## 2014-05-20 VITALS — BP 120/77 | HR 84 | Resp 16 | Ht 62.0 in | Wt 130.0 lb

## 2014-05-20 DIAGNOSIS — N946 Dysmenorrhea, unspecified: Secondary | ICD-10-CM | POA: Insufficient documentation

## 2014-05-20 NOTE — Progress Notes (Signed)
Patient ID: Sandra Adams, female   DOB: Mar 10, 1966, 48 y.o.   MRN: [id number]  Chief Complaint  Patient presents with  . Gynecologic Exam    Cycle twice per month    HPI Sandra Adams is a 48 y.o. female.  Patient's last menstrual period was 05/03/2014. G0P0000 Menses last for 6 days and are painful, ibuprofen gives relief. Cycle less than 20 days. She has VMS.  HPI  Past Medical History  Diagnosis Date  . Anxiety   . Depression   . Migraine   . Scoliosis     Past Surgical History  Procedure Laterality Date  . Knee surgery      Family History  Problem Relation Age of Onset  . Epilepsy Mother   . COPD Father   . COPD Sister   . COPD Sister     Social History History  Substance Use Topics  . Smoking status: Never Smoker   . Smokeless tobacco: Never Used  . Alcohol Use: No    Allergies  Allergen Reactions  . Prednisone Nausea And Vomiting  . Tylenol [Acetaminophen]     Tylenol #3,Headaches  . Vicodin [Hydrocodone-Acetaminophen]     GI upset    Current Outpatient Prescriptions  Medication Sig Dispense Refill  . ALPRAZolam (XANAX) 0.25 MG tablet Take one hour prior to procedure and may repeat x 1 prior to procedure  2 tablet  0  . cloNIDine (CATAPRES) 0.1 MG tablet One po qhs prn hot flashes  30 tablet  11  . hydrOXYzine (ATARAX/VISTARIL) 10 MG tablet Take 1 tablet (10 mg total) by mouth at bedtime as needed.  30 tablet  0  . ibuprofen (ADVIL,MOTRIN) 800 MG tablet Take 1 tablet (800 mg total) by mouth 3 (three) times daily.  60 tablet  1  . nitrofurantoin, macrocrystal-monohydrate, (MACROBID) 100 MG capsule Take 100 mg by mouth 2 (two) times daily.      Marland Kitchen omeprazole (PRILOSEC) 20 MG capsule Take 1 capsule (20 mg total) by mouth daily.  30 capsule  3  . sertraline (ZOLOFT) 50 MG tablet Take 1 tablet (50 mg total) by mouth daily.  30 tablet  11  . SUMAtriptan (IMITREX) 100 MG tablet Take 1 tablet (100 mg total) by mouth every 2 (two) hours as needed for migraine  or headache. May repeat in 2 hours if headache persists or recurs.  10 tablet  0  . tinidazole (TINDAMAX) 500 MG tablet Take 4 tablets po qd with meal x 2 days for vaginal discharge  8 tablet  1   No current facility-administered medications for this visit.    Review of Systems Review of Systems  Constitutional: Negative.   Gastrointestinal: Positive for abdominal distention. Negative for nausea and constipation.  Genitourinary: Positive for menstrual problem. Negative for urgency, vaginal bleeding and vaginal discharge.    Blood pressure 120/77, pulse 84, resp. rate 16, height 5\' 2"  (1.575 m), weight 58.968 kg (130 lb), last menstrual period 05/03/2014.  Physical Exam Physical Exam  Constitutional: She is oriented to person, place, and time. She appears well-developed. No distress.  Pulmonary/Chest: Effort normal. No respiratory distress.  Abdominal: Soft. She exhibits distension. She exhibits no mass. There is no tenderness.  Genitourinary: Vagina normal and uterus normal. No vaginal discharge found.  No mass or tenderness  Neurological: She is alert and oriented to person, place, and time.  Skin: Skin is warm and dry.  Psychiatric: She has a normal mood and affect. Her behavior is normal.  Data Reviewed *RADIOLOGY REPORT*  Clinical Data: Left pelvic pain  TRANSABDOMINAL AND TRANSVAGINAL ULTRASOUND OF PELVIS  Technique: Both transabdominal and transvaginal ultrasound  examinations of the pelvis were performed including evaluation of  the uterus, ovaries, adnexal regions, and pelvic cul-de-sac.  Comparison: None  Findings:  Uterus: The uterus measures 9.7 cm sagittally, with a depth of 3.7  cm in width of 3.8 cm. No myometrial abnormality is seen. A small  fibroid is questioned posteriorly of 26 x 4 x 4 mm.  Endometrium:The endometrium is somewhat inhomogeneous measuring 8.8  mm in thickness. This endometrial thickness is within upper limits  of normal post menopausal  without bleeding. However due to the  inhomogeneity, and endometrial polyp or endometrial hyperplasia  would be considerations.  Right Ovary :The right ovary measures 3.1 x 2.6 x 1.1 cm with  follicles.  Left Ovary :Left ovary measures 2.8 x 2.4 x 2.2 cm with follow-up.  Other Findings: Only a trace amount of fluid is present.  IMPRESSION:  1. Endometrium within upper limits of normal in thickness but  somewhat inhomogeneous in echogenicity. Consider polyp or  endometrial hyperplasia.  2. Question of tiny fibroid posteriorly of only 6 mm in diameter.  3. Bilateral ovarian follicles with trace amount of fluid.  Original Report Authenticated By: Joretta Bachelor, M.D.   Assessment    Dysmenorrhea, ultrasound from 07/2011 reviewed     Plan    [place] repeat, give her result, RTC 3 months, continue ibuprofen for menstrual pain        Woodroe Mode 05/20/2014, 2:34 PM

## 2014-05-20 NOTE — Patient Instructions (Signed)
Perimenopause  Perimenopause is the time when your body begins to move into the menopause (no menstrual period for 12 straight months). It is a natural process. Perimenopause can begin 2 8 years before the menopause and usually lasts for 1 year after the menopause. During this time, your ovaries may or may not produce an egg. The ovaries vary in their production of estrogen and progesterone hormones each month. This can cause irregular menstrual periods, difficulty getting pregnant, vaginal bleeding between periods, and uncomfortable symptoms.  CAUSES  · Irregular production of the ovarian hormones, estrogen and progesterone, and not ovulating every month.  · Other causes include:  · Tumor of the pituitary gland in the brain.  · Medical disease that affects the ovaries.  · Radiation treatment.  · Chemotherapy.  · Unknown causes.  · Heavy smoking and excessive alcohol intake can bring on perimenopause sooner.  SIGNS AND SYMPTOMS   · Hot flashes.  · Night sweats.  · Irregular menstrual periods.  · Decreased sex drive.  · Vaginal dryness.  · Headaches.  · Mood swings.  · Depression.  · Memory problems.  · Irritability.  · Tiredness.  · Weight gain.  · Trouble getting pregnant.  · The beginning of losing bone cells (osteoporosis).  · The beginning of hardening of the arteries (atherosclerosis).  DIAGNOSIS   Your health care provider will make a diagnosis by analyzing your age, menstrual history, and symptoms. He or she will do a physical exam and note any changes in your body, especially your female organs. Female hormone tests may or may not be helpful depending on the amount of female hormones you produce and when you produce them. However, other hormone tests may be helpful to rule out other problems.  TREATMENT   In some cases, no treatment is needed. The decision on whether treatment is necessary during the perimenopause should be made by you and your health care provider based on how the symptoms are affecting you  and your lifestyle. Various treatments are available, such as:  · Treating individual symptoms with a specific medicine for that symptom.  · Herbal medicines that can help specific symptoms.  · Counseling.  · Group therapy.  HOME CARE INSTRUCTIONS   · Keep track of your menstrual periods (when they occur, how heavy they are, how long between periods, and how long they last) as well as your symptoms and when they started.  · Only take over-the-counter or prescription medicines as directed by your health care provider.  · Sleep and rest.  · Exercise.  · Eat a diet that contains calcium (good for your bones) and soy (acts like the estrogen hormone).  · Do not smoke.  · Avoid alcoholic beverages.  · Take vitamin supplements as recommended by your health care provider. Taking vitamin E may help in certain cases.  · Take calcium and vitamin D supplements to help prevent bone loss.  · Group therapy is sometimes helpful.  · Acupuncture may help in some cases.  SEEK MEDICAL CARE IF:   · You have questions about any symptoms you are having.  · You need a referral to a specialist (gynecologist, psychiatrist, or psychologist).  SEEK IMMEDIATE MEDICAL CARE IF:   · You have vaginal bleeding.  · Your period lasts longer than 8 days.  · Your periods are recurring sooner than 21 days.  · You have bleeding after intercourse.  · You have severe depression.  · You have pain when you urinate.  · 

## 2014-05-24 ENCOUNTER — Ambulatory Visit (HOSPITAL_COMMUNITY): Payer: 59

## 2014-05-27 ENCOUNTER — Ambulatory Visit (HOSPITAL_COMMUNITY)
Admission: RE | Admit: 2014-05-27 | Discharge: 2014-05-27 | Disposition: A | Discharge status: Home / Self Care | Payer: PRIVATE HEALTH INSURANCE | Source: Ambulatory Visit | Attending: Obstetrics & Gynecology | Admitting: Obstetrics & Gynecology

## 2014-05-27 DIAGNOSIS — N946 Dysmenorrhea, unspecified: Secondary | ICD-10-CM | POA: Diagnosis present

## 2014-06-05 ENCOUNTER — Telehealth: Payer: Self-pay | Admitting: Physician Assistant

## 2014-06-05 NOTE — Telephone Encounter (Signed)
Left detailed message on personal voicemail that she would need to contact Dr. Jordan Hawks office for these results and follow up recommendations.

## 2014-06-05 NOTE — Telephone Encounter (Signed)
This is on her U/s and the results are now back can you please review for the patient

## 2014-06-05 NOTE — Telephone Encounter (Signed)
Pt had this test ordered through her Gyn MD She needs to f/u with them for the results

## 2014-07-31 ENCOUNTER — Other Ambulatory Visit: Payer: Self-pay | Admitting: Family Medicine

## 2014-08-01 NOTE — Telephone Encounter (Signed)
Patient last seen in office on 05-03-14 with Bonita. Rx was originally given on 12-27-13 for vaginal discharge. Please advise on refill

## 2014-08-20 ENCOUNTER — Ambulatory Visit: Payer: Medicare Other | Admitting: Family Medicine

## 2014-08-21 NOTE — Telephone Encounter (Signed)
This encounter was created in error - please disregard.

## 2014-08-29 ENCOUNTER — Telehealth: Payer: Self-pay | Admitting: Family Medicine

## 2014-08-29 ENCOUNTER — Ambulatory Visit: Payer: Medicare Other | Admitting: Family Medicine

## 2014-08-29 NOTE — Telephone Encounter (Signed)
appt made

## 2014-09-02 ENCOUNTER — Telehealth: Payer: Self-pay | Admitting: Obstetrics & Gynecology

## 2014-09-02 NOTE — Telephone Encounter (Signed)
Few small fibroids seen on [place]. Is she scheduled to return?

## 2014-09-02 NOTE — Telephone Encounter (Signed)
Will have Dr. Roselie Awkward review and then I will call pt.

## 2014-09-03 NOTE — Telephone Encounter (Signed)
I have left her a message to call back so we can set up an appointment for her to come in to follow up.

## 2014-09-16 ENCOUNTER — Ambulatory Visit (INDEPENDENT_AMBULATORY_CARE_PROVIDER_SITE_OTHER): Payer: PRIVATE HEALTH INSURANCE | Admitting: Obstetrics & Gynecology

## 2014-09-16 ENCOUNTER — Encounter: Payer: Self-pay | Admitting: Obstetrics & Gynecology

## 2014-09-16 VITALS — BP 123/78 | HR 86 | Resp 16 | Ht 61.0 in | Wt 131.0 lb

## 2014-09-16 DIAGNOSIS — N951 Menopausal and female climacteric states: Secondary | ICD-10-CM | POA: Insufficient documentation

## 2014-09-16 MED ORDER — ESTRADIOL 1 MG PO TABS: 1.0000 mg | ORAL_TABLET | Freq: Every day | ORAL | Status: DC

## 2014-09-16 MED ORDER — MEDROXYPROGESTERONE ACETATE 2.5 MG PO TABS: 2.5000 mg | ORAL_TABLET | Freq: Every day | ORAL | Status: DC

## 2014-09-16 NOTE — Progress Notes (Signed)
Subjective:     Patient ID: Sandra Adams, female   DOB: Sep 23, 1966, 48 y.o.   MRN: [id number]  HPI G0P0000 Patient's last menstrual period was 04/19/2014. Has VMS with flushes and sweats not responding to clonidine. Had [place] 05/2014 f/u small fibroids  Current outpatient prescriptions:ALPRAZolam (XANAX) 0.25 MG tablet, Take one hour prior to procedure and may repeat x 1 prior to procedure, Disp: 2 tablet, Rfl: 0;  cloNIDine (CATAPRES) 0.1 MG tablet, One po qhs prn hot flashes, Disp: 30 tablet, Rfl: 11;  hydrOXYzine (ATARAX/VISTARIL) 10 MG tablet, Take 1 tablet (10 mg total) by mouth at bedtime as needed., Disp: 30 tablet, Rfl: 0 ibuprofen (ADVIL,MOTRIN) 800 MG tablet, Take 1 tablet (800 mg total) by mouth 3 (three) times daily., Disp: 60 tablet, Rfl: 1;  omeprazole (PRILOSEC) 20 MG capsule, Take 1 capsule (20 mg total) by mouth daily., Disp: 30 capsule, Rfl: 3;  sertraline (ZOLOFT) 50 MG tablet, Take 1 tablet (50 mg total) by mouth daily., Disp: 30 tablet, Rfl: 11 SUMAtriptan (IMITREX) 100 MG tablet, Take 1 tablet (100 mg total) by mouth every 2 (two) hours as needed for migraine or headache. May repeat in 2 hours if headache persists or recurs., Disp: 10 tablet, Rfl: 0;  estradiol (ESTRACE) 1 MG tablet, Take 1 tablet (1 mg total) by mouth daily., Disp: 30 tablet, Rfl: 11;  medroxyPROGESTERone (PROVERA) 2.5 MG tablet, Take 1 tablet (2.5 mg total) by mouth daily., Disp: 30 tablet, Rfl: 11   Review of Systems  Constitutional: Positive for diaphoresis and fatigue.  Endocrine: Positive for heat intolerance.  Genitourinary: Positive for vaginal discharge. Negative for vaginal bleeding and menstrual problem.       Objective:   Physical Exam  Constitutional: She appears well-developed. No distress.  Genitourinary: Uterus normal. Vaginal discharge (white) found.  Neurological: She is alert.  Skin: Skin is warm and dry.  Psychiatric: She has a normal mood and affect. Her behavior is normal.   CLINICAL  DATA: Dysmenorrhea ; past history of fibroids, abnormal  endometrium  EXAM:  TRANSABDOMINAL AND TRANSVAGINAL ULTRASOUND OF PELVIS  TECHNIQUE:  Both transabdominal and transvaginal ultrasound examinations of the  pelvis were performed. Transabdominal technique was performed for  global imaging of the pelvis including uterus, ovaries, adnexal  regions, and pelvic cul-de-sac. It was necessary to proceed with  endovaginal exam following the transabdominal exam to visualize the  endometrium and ovaries.  COMPARISON: 08/06/2011  FINDINGS:  Uterus  Measurements: 7.0 x 3.6 x 3.7 cm. Two small probable uterine  leiomyomata noted, intramural posteriorly at upper uterine segment  15 x 10 x 16 mm (previously 4 x 6 x 4 mm) and submucosal at fundus 9  x 10 x 9 mm (not definitely visualized previously).  Endometrium  Thickness: 7 mm thick. Normal appearance without fluid or focal  abnormality.  Right ovary  Measurements: 2.4 x 1.1 x 1.1 cm. Normal morphology without mass.  Left ovary  Measurements: 2.8 x 1.2 x 1.0 cm. Normal morphology without mass.  Other findings  No free pelvic fluid or adnexal masses.  IMPRESSION:  Small probable uterine leiomyomata as above.  Otherwise negative exam.     Assessment:     Perimenopause with VMS, small fibroids no sx     Plan:     Estrace and Provera, RTC 3 months  F/U BD affirm test sent today  Woodroe Mode, MD 09/16/2014

## 2014-09-16 NOTE — Patient Instructions (Signed)

## 2014-09-17 ENCOUNTER — Other Ambulatory Visit (HOSPITAL_COMMUNITY)
Admission: RE | Admit: 2014-09-17 | Discharge: 2014-09-17 | Disposition: A | Discharge status: Home / Self Care | Payer: 59 | Source: Ambulatory Visit | Attending: Obstetrics & Gynecology | Admitting: Obstetrics & Gynecology

## 2014-09-17 DIAGNOSIS — N760 Acute vaginitis: Secondary | ICD-10-CM | POA: Diagnosis not present

## 2014-09-17 DIAGNOSIS — N951 Menopausal and female climacteric states: Secondary | ICD-10-CM | POA: Insufficient documentation

## 2014-09-17 NOTE — Addendum Note (Signed)
Addended by: Gretchen Short on: 09/17/2014 10:53 AM   Modules accepted: Orders

## 2014-09-18 LAB — CERVICOVAGINAL ANCILLARY ONLY
WET PREP (BD AFFIRM): NEGATIVE
WET PREP (BD AFFIRM): POSITIVE — AB
Wet Prep (BD Affirm): NEGATIVE

## 2014-09-23 ENCOUNTER — Telehealth: Payer: Self-pay | Admitting: *Deleted

## 2014-09-23 DIAGNOSIS — B9689 Other specified bacterial agents as the cause of diseases classified elsewhere: Secondary | ICD-10-CM

## 2014-09-23 DIAGNOSIS — N760 Acute vaginitis: Principal | ICD-10-CM

## 2014-09-23 MED ORDER — METRONIDAZOLE 500 MG PO TABS: 500.0000 mg | ORAL_TABLET | Freq: Two times a day (BID) | ORAL | Status: DC

## 2014-09-23 NOTE — Telephone Encounter (Signed)
Called pt to adv medds at pharm for BV - Pt expressed understanding.

## 2014-09-23 NOTE — Telephone Encounter (Signed)
Message copied by Gretchen Short on Mon Sep 23, 2014  9:23 AM ------      Message from: Woodroe Mode      Created: Fri Sep 20, 2014  2:30 PM       Flagyl 500 mg bid 7 days ------

## 2014-10-01 ENCOUNTER — Telehealth: Payer: Self-pay | Admitting: Family Medicine

## 2014-10-01 ENCOUNTER — Encounter: Payer: Self-pay | Admitting: Family

## 2014-10-01 ENCOUNTER — Ambulatory Visit (INDEPENDENT_AMBULATORY_CARE_PROVIDER_SITE_OTHER): Payer: Medicare Other | Admitting: Family

## 2014-10-01 VITALS — BP 107/76 | HR 84 | Temp 98.5°F | Ht 61.0 in | Wt 129.0 lb

## 2014-10-01 DIAGNOSIS — F329 Major depressive disorder, single episode, unspecified: Secondary | ICD-10-CM

## 2014-10-01 DIAGNOSIS — R300 Dysuria: Secondary | ICD-10-CM

## 2014-10-01 DIAGNOSIS — N390 Urinary tract infection, site not specified: Secondary | ICD-10-CM

## 2014-10-01 DIAGNOSIS — K219 Gastro-esophageal reflux disease without esophagitis: Secondary | ICD-10-CM

## 2014-10-01 DIAGNOSIS — F411 Generalized anxiety disorder: Secondary | ICD-10-CM

## 2014-10-01 DIAGNOSIS — R319 Hematuria, unspecified: Secondary | ICD-10-CM

## 2014-10-01 DIAGNOSIS — F32A Depression, unspecified: Secondary | ICD-10-CM

## 2014-10-01 DIAGNOSIS — B370 Candidal stomatitis: Secondary | ICD-10-CM

## 2014-10-01 LAB — POCT UA - MICROSCOPIC ONLY
CRYSTALS, UR, HPF, POC: NEGATIVE
Casts, Ur, LPF, POC: NEGATIVE
Mucus, UA: NEGATIVE
YEAST UA: NEGATIVE

## 2014-10-01 LAB — POCT URINALYSIS DIPSTICK
Bilirubin, UA: NEGATIVE
Glucose, UA: NEGATIVE
Ketones, UA: NEGATIVE
Nitrite, UA: NEGATIVE
PH UA: 5
SPEC GRAV UA: 1.01
UROBILINOGEN UA: NEGATIVE

## 2014-10-01 LAB — POCT WET PREP (WET MOUNT)

## 2014-10-01 MED ORDER — OMEPRAZOLE 20 MG PO CPDR: 20.0000 mg | DELAYED_RELEASE_CAPSULE | Freq: Every day | ORAL | Status: DC

## 2014-10-01 MED ORDER — SULFAMETHOXAZOLE-TMP DS 800-160 MG PO TABS: 1.0000 | ORAL_TABLET | Freq: Two times a day (BID) | ORAL | Status: DC

## 2014-10-01 MED ORDER — NYSTATIN 100000 UNIT/ML MT SUSP: 5.0000 mL | Freq: Four times a day (QID) | OROMUCOSAL | Status: DC

## 2014-10-01 MED ORDER — ALPRAZOLAM 0.25 MG PO TABS: 0.2500 mg | ORAL_TABLET | Freq: Every evening | ORAL | Status: DC | PRN

## 2014-10-01 MED ORDER — SERTRALINE HCL 50 MG PO TABS: 50.0000 mg | ORAL_TABLET | Freq: Every day | ORAL | Status: DC

## 2014-10-01 NOTE — Progress Notes (Signed)
Subjective:    Patient ID: Sandra Adams, female    DOB: 02-Apr-1966, 48 y.o.   MRN: [id number]  Anxiety Presents for follow-up visit. Symptoms include depressed mood and insomnia. Patient reports no chest pain, excessive worry, irritability, nausea, nervous/anxious behavior, palpitations, panic, restlessness, shortness of breath or suicidal ideas. Symptoms occur occasionally. The quality of sleep is fair.   Her past medical history is significant for anxiety/panic attacks and depression. Past treatments include benzodiazephines and SSRIs.  Gastrophageal Reflux She reports no belching, no chest pain, no coughing, no heartburn, no nausea, no sore throat or no wheezing. This is a chronic problem. The current episode started more than 1 year ago. The problem occurs rarely. The problem has been resolved. The symptoms are aggravated by certain foods. She has tried a PPI for the symptoms. The treatment provided moderate relief.  Urinary Tract Infection  This is a new problem. The current episode started 1 to 4 weeks ago. The problem occurs intermittently. The problem has been waxing and waning. The quality of the pain is described as burning. The pain is mild. There has been no fever. Associated symptoms include flank pain, frequency and hesitancy. Pertinent negatives include no hematuria, nausea or vomiting. She has tried antibiotics and increased fluids for the symptoms. The treatment provided mild relief. Her past medical history is significant for recurrent UTIs.      Review of Systems  Constitutional: Negative.  Negative for irritability.  HENT: Negative.  Negative for sore throat.   Eyes: Negative.   Respiratory: Negative.  Negative for cough, shortness of breath and wheezing.   Cardiovascular: Negative.  Negative for chest pain and palpitations.  Gastrointestinal: Negative.  Negative for heartburn, nausea and vomiting.  Endocrine: Negative.   Genitourinary: Positive for hesitancy, frequency  and flank pain. Negative for hematuria.  Musculoskeletal: Negative.   Neurological: Negative.  Negative for headaches.  Hematological: Negative.   Psychiatric/Behavioral: Negative for suicidal ideas. The patient has insomnia. The patient is not nervous/anxious.   All other systems reviewed and are negative.      Objective:   Physical Exam  Vitals reviewed. Constitutional: She is oriented to person, place, and time. She appears well-developed and well-nourished. No distress.  HENT:  Head: Normocephalic and atraumatic.  Right Ear: External ear normal.  Left Ear: External ear normal.  Nose: Nose normal.  Mouth/Throat: Oropharynx is clear and moist.  Eyes: Pupils are equal, round, and reactive to light.  Neck: Normal range of motion. Neck supple. No thyromegaly present.  Cardiovascular: Normal rate, regular rhythm, normal heart sounds and intact distal pulses.   No murmur heard. Pulmonary/Chest: Effort normal and breath sounds normal. No respiratory distress. She has no wheezes.  Abdominal: Soft. Bowel sounds are normal. She exhibits no distension. There is no tenderness.  Musculoskeletal: Normal range of motion. She exhibits no edema and no tenderness.  Negative for CVA tenderness  Neurological: She is alert and oriented to person, place, and time. She has normal reflexes. No cranial nerve deficit.  Skin: Skin is warm and dry.  Psychiatric: She has a normal mood and affect. Her behavior is normal. Judgment and thought content normal.   Results for orders placed in visit on 10/01/14  POCT URINALYSIS DIPSTICK      Result Value Ref Range   Color, UA gold     Clarity, UA clear     Glucose, UA negative     Bilirubin, UA negative     Ketones, UA negative  Spec Grav, UA 1.010     Blood, UA moderate     pH, UA 5.0     Protein, UA 1+     Urobilinogen, UA negative     Nitrite, UA negative     Leukocytes, UA moderate (2+)    POCT UA - MICROSCOPIC ONLY      Result Value Ref Range     WBC, Ur, HPF, POC 20-40     RBC, urine, microscopic 5-8     Bacteria, U Microscopic moderate     Mucus, UA negative     Epithelial cells, urine per micros few     Crystals, Ur, HPF, POC negative     Casts, Ur, LPF, POC negative     Yeast, UA negative        BP 107/76  Pulse 84  Temp(Src) 98.5 F (36.9 C) (Oral)  Ht _0  (1.549 m)  Wt 129 lb (58.514 kg)  BMI 24.39 kg/m2  LMP 04/19/2014     Assessment & Plan:  1. Burning with urination - POCT urinalysis dipstick - POCT UA - Microscopic Only - POCT Wet Prep [facility])  2. Gastroesophageal reflux disease without esophagitis - omeprazole (PRILOSEC) 20 MG capsule; Take 1 capsule (20 mg total) by mouth daily.  Dispense: 90 capsule; Refill: 3 - CMP14+EGFR  3. GAD (generalized anxiety disorder) - ALPRAZolam (XANAX) 0.25 MG tablet; Take 1 tablet (0.25 mg total) by mouth at bedtime as needed for anxiety.  Dispense: 30 tablet; Refill: 1 - CMP14+EGFR  4. Depression - sertraline (ZOLOFT) 50 MG tablet; Take 1 tablet (50 mg total) by mouth daily.  Dispense: 90 tablet; Refill: 4 - CMP14+EGFR  5. Urinary tract infection with hematuria, site unspecified - sulfamethoxazole-trimethoprim (BACTRIM DS) 800-160 MG per tablet; Take 1 tablet by mouth 2 (two) times daily.  Dispense: 10 tablet; Refill: 0  6. Oral thrush - nystatin (MYCOSTATIN) 100000 UNIT/ML suspension; Take 5 mLs (500,000 Units total) by mouth 4 (four) times daily.  Dispense: 60 mL; Refill: 0   Continue all meds Labs pending Health Maintenance reviewed Diet and exercise encouraged RTO 6 months  [name], FNP

## 2014-10-01 NOTE — Patient Instructions (Addendum)
Urinary Tract Infection Urinary tract infections (UTIs) can develop anywhere along your urinary tract. Your urinary tract is your body's drainage system for removing wastes and extra water. Your urinary tract includes two kidneys, two ureters, a bladder, and a urethra. Your kidneys are a pair of bean-shaped organs. Each kidney is about the size of your fist. They are located below your ribs, one on each side of your spine. CAUSES Infections are caused by microbes, which are microscopic organisms, including fungi, viruses, and bacteria. These organisms are so small that they can only be seen through a microscope. Bacteria are the microbes that most commonly cause UTIs. SYMPTOMS  Symptoms of UTIs may vary by age and gender of the patient and by the location of the infection. Symptoms in young women typically include a frequent and intense urge to urinate and a painful, burning feeling in the bladder or urethra during urination. Older women and men are more likely to be tired, shaky, and weak and have muscle aches and abdominal pain. A fever may mean the infection is in your kidneys. Other symptoms of a kidney infection include pain in your back or sides below the ribs, nausea, and vomiting. DIAGNOSIS To diagnose a UTI, your caregiver will ask you about your symptoms. Your caregiver also will ask to provide a urine sample. The urine sample will be tested for bacteria and white blood cells. White blood cells are made by your body to help fight infection. TREATMENT  Typically, UTIs can be treated with medication. Because most UTIs are caused by a bacterial infection, they usually can be treated with the use of antibiotics. The choice of antibiotic and length of treatment depend on your symptoms and the type of bacteria causing your infection. HOME CARE INSTRUCTIONS  If you were prescribed antibiotics, take them exactly as your caregiver instructs you. Finish the medication even if you feel better after you  have only taken some of the medication.  Drink enough water and fluids to keep your urine clear or pale yellow.  Avoid caffeine, tea, and carbonated beverages. They tend to irritate your bladder.  Empty your bladder often. Avoid holding urine for long periods of time.  Empty your bladder before and after sexual intercourse.  After a bowel movement, women should cleanse from front to back. Use each tissue only once. SEEK MEDICAL CARE IF:   You have back pain.  You develop a fever.  Your symptoms do not begin to resolve within 3 days. SEEK IMMEDIATE MEDICAL CARE IF:   You have severe back pain or lower abdominal pain.  You develop chills.  You have nausea or vomiting.  You have continued burning or discomfort with urination. MAKE SURE YOU:   Understand these instructions.  Will watch your condition.  Will get help right away if you are not doing well or get worse. Document Released: 09/15/2005 Document Revised: 06/06/2012 Document Reviewed: 01/14/2012 [facility] Patient Information 2015 [place], [place]. This information is not intended to replace advice given to you by your health care provider. Make sure you discuss any questions you have with your health care provider. Thrush, Adult  Ritta Slot, also called oral candidiasis, is a fungal infection that develops in the mouth and throat and on the tongue. It causes white patches to form on the mouth and tongue. Ritta Slot is most common in older adults, but it can occur at any age.  Many cases of thrush are mild, but this infection can also be more serious. Ritta Slot can be a  recurring problem for people who have chronic illnesses or who take medicines that limit the body's ability to fight infection. Because these people have difficulty fighting infections, the fungus that causes thrush can spread throughout the body. This can cause life-threatening blood or organ infections. CAUSES  Ritta Slot is usually caused by a yeast called Candida  albicans. This fungus is normally present in small amounts in the mouth and on other mucous membranes. It usually causes no harm. However, when conditions are present that allow the fungus to grow uncontrolled, it invades surrounding tissues and becomes an infection. Less often, other Candida species can also lead to thrush.  RISK FACTORS Ritta Slot is more likely to develop in the following people:  People with an impaired ability to fight infection (weakened immune system).   Older adults.   People with HIV.   People with diabetes.   People with dry mouth (xerostomia).   Pregnant women.   People with poor dental care, especially those who have false teeth.   People who use antibiotic medicines.  SIGNS AND SYMPTOMS  Ritta Slot can be a mild infection that causes no symptoms. If symptoms develop, they may include:   A burning feeling in the mouth and throat. This can occur at the start of a thrush infection.   White patches that adhere to the mouth and tongue. The tissue around the patches may be red, raw, and painful. If rubbed (during tooth brushing, for example), the patches and the tissue of the mouth may bleed easily.   A bad taste in the mouth or difficulty tasting foods.   Cottony feeling in the mouth.   Pain during eating and swallowing. DIAGNOSIS  Your health care provider can usually diagnose thrush by looking in your mouth and asking you questions about your health.  TREATMENT  Medicines that help prevent the growth of fungi (antifungals) are the standard treatment for thrush. These medicines are either applied directly to the affected area (topical) or swallowed (oral). The treatment will depend on the severity of the condition.  Mild Thrush Mild cases of thrush may clear up with the use of an antifungal mouth rinse or lozenges. Treatment usually lasts about 14 days.  Moderate to Severe Thrush  More severe thrush infections that have spread to the esophagus are  treated with an oral antifungal medicine. A topical antifungal medicine may also be used.   For some severe infections, a treatment period longer than 14 days may be needed.   Oral antifungal medicines are almost never used during pregnancy because the fetus may be harmed. However, if a pregnant woman has a rare, severe thrush infection that has spread to her blood, oral antifungal medicines may be used. In this case, the risk of harm to the mother and fetus from the severe thrush infection may be greater than the risk posed by the use of antifungal medicines.  Persistent or Recurrent Thrush For cases of thrush that do not go away or keep coming back, treatment may involve the following:   Treatment may be needed twice as long as the symptoms last.   Treatment will include both oral and topical antifungal medicines.   People with weakened immune systems can take an antifungal medicine on a continuous basis to prevent thrush infections.  It is important to treat conditions that make you more likely to get thrush, such as diabetes or HIV.  HOME CARE INSTRUCTIONS   Only take over-the-counter or prescription medicine as directed by your health care provider.  Talk to your health care provider about an over-the-counter medicine called gentian violet, which kills bacteria and fungi.   Eat plain, unflavored yogurt as directed by your health care provider. Check the label to make sure the yogurt contains live cultures. This yogurt can help healthy bacteria grow in the mouth that can stop the growth of the fungus that causes thrush.   Try these measures to help reduce the discomfort of thrush:   Drink cold liquids such as water or iced tea.   Try flavored ice treats or frozen juices.   Eat foods that are easy to swallow, such as gelatin, ice cream, or custard.   If the patches in your mouth are painful, try drinking from a straw.   Rinse your mouth several times a day with a warm  saltwater rinse. You can make the saltwater mixture with 1 tsp (6 g) of salt in 8 fl oz (0.2 L) of warm water.   If you wear dentures, remove the dentures before going to bed, brush them vigorously, and soak them in a cleaning solution as directed by your health care provider.   Women who are breastfeeding should clean their nipples with an antifungal medicine as directed by their health care provider. Dry the nipples after breastfeeding. Applying lanolin-containing body lotion may help relieve nipple soreness.  SEEK MEDICAL CARE IF:  Your symptoms are getting worse or are not improving within 7 days of starting treatment.   You have symptoms of spreading infection, such as white patches on the skin outside of the mouth.   You are nursing and you have redness, burning, or pain in the nipples that is not relieved with treatment.  MAKE SURE YOU:  Understand these instructions.  Will watch your condition.  Will get help right away if you are not doing well or get worse. Document Released: 08/31/2004 Document Revised: 09/26/2013 Document Reviewed: 07/09/2013 [facility] Patient Information 2015 [place], [place]. This information is not intended to replace advice given to you by your health care provider. Make sure you discuss any questions you have with your health care provider.

## 2014-10-02 LAB — CMP14+EGFR
ALT: 27 IU/L (ref 0–32)
AST: 24 IU/L (ref 0–40)
Albumin/Globulin Ratio: 1.8 (ref 1.1–2.5)
Albumin: 4.4 g/dL (ref 3.5–5.5)
Alkaline Phosphatase: 95 IU/L (ref 39–117)
BUN/Creatinine Ratio: 13 (ref 9–23)
BUN: 9 mg/dL (ref 6–24)
CALCIUM: 9.6 mg/dL (ref 8.7–10.2)
CO2: 25 mmol/L (ref 18–29)
Chloride: 100 mmol/L (ref 97–108)
Creatinine, Ser: 0.69 mg/dL (ref 0.57–1.00)
GFR calc Af Amer: 117 mL/min/{1.73_m2} (ref >59)
GFR calc non Af Amer: 102 mL/min/{1.73_m2} (ref >59)
Globulin, Total: 2.5 g/dL (ref 1.5–4.5)
Glucose: 72 mg/dL (ref 65–99)
Potassium: 4.3 mmol/L (ref 3.5–5.2)
SODIUM: 141 mmol/L (ref 134–144)
Total Bilirubin: 0.3 mg/dL (ref 0.0–1.2)
Total Protein: 6.9 g/dL (ref 6.0–8.5)

## 2014-10-14 NOTE — Telephone Encounter (Signed)
RX done 10-13.

## 2014-10-21 IMAGING — CR DG SINUSES COMPLETE 3+V
3 series · 3 of 3 positions shown · non-contrast
Comparison: None.

CLINICAL DATA: Headaches

EXAM:
PARANASAL SINUSES - COMPLETE 3 + VIEW

[view not recorded (1 of 3)]
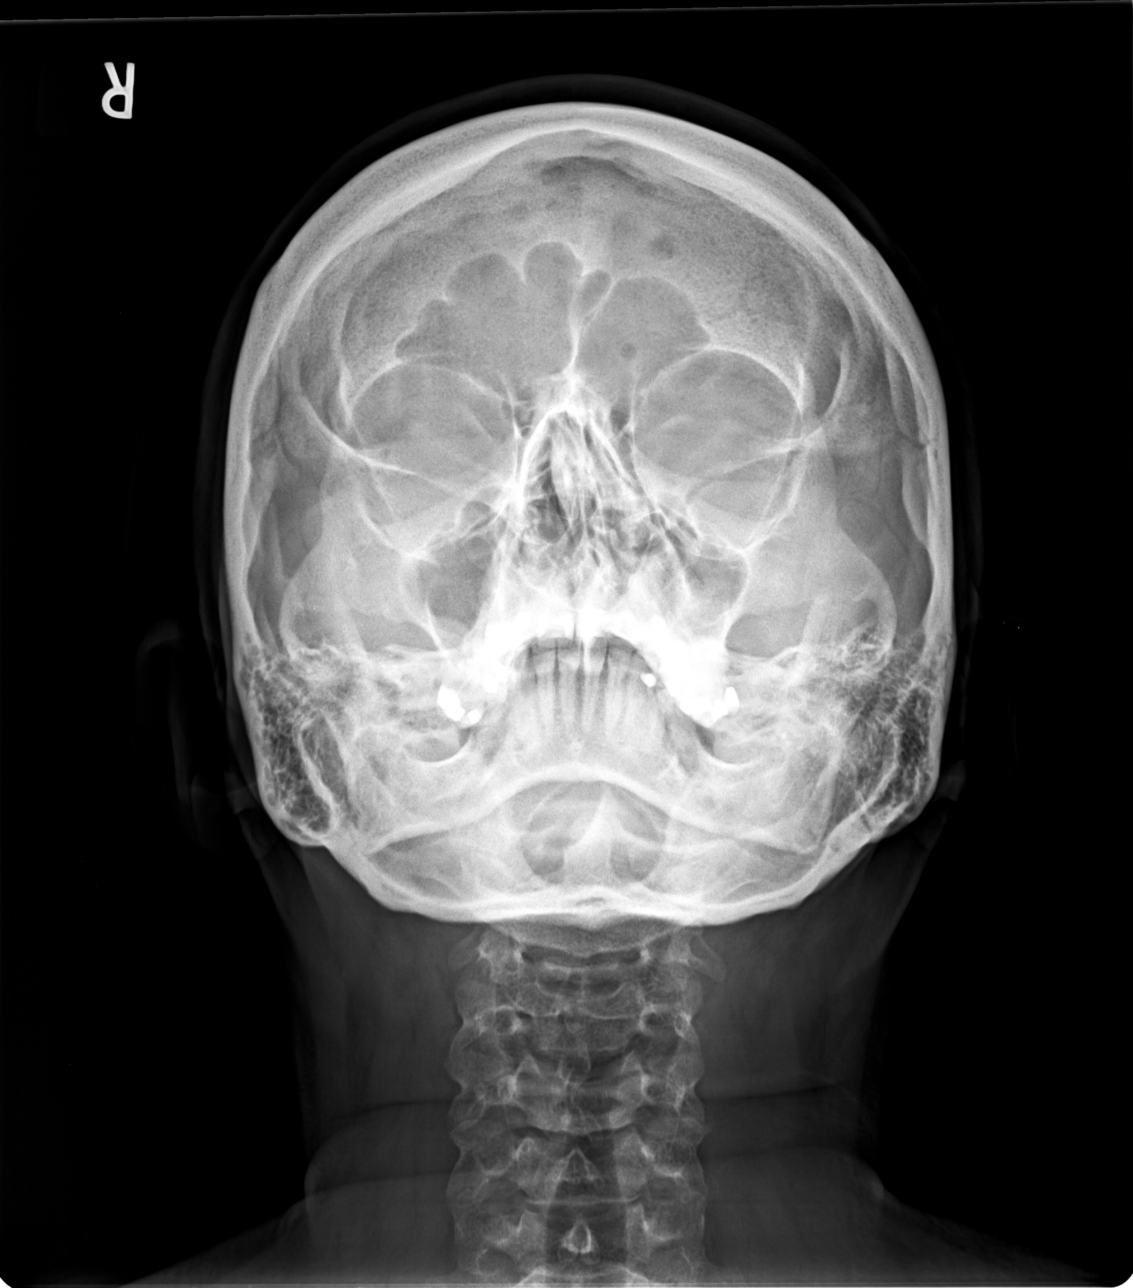

[view not recorded (2 of 3)]
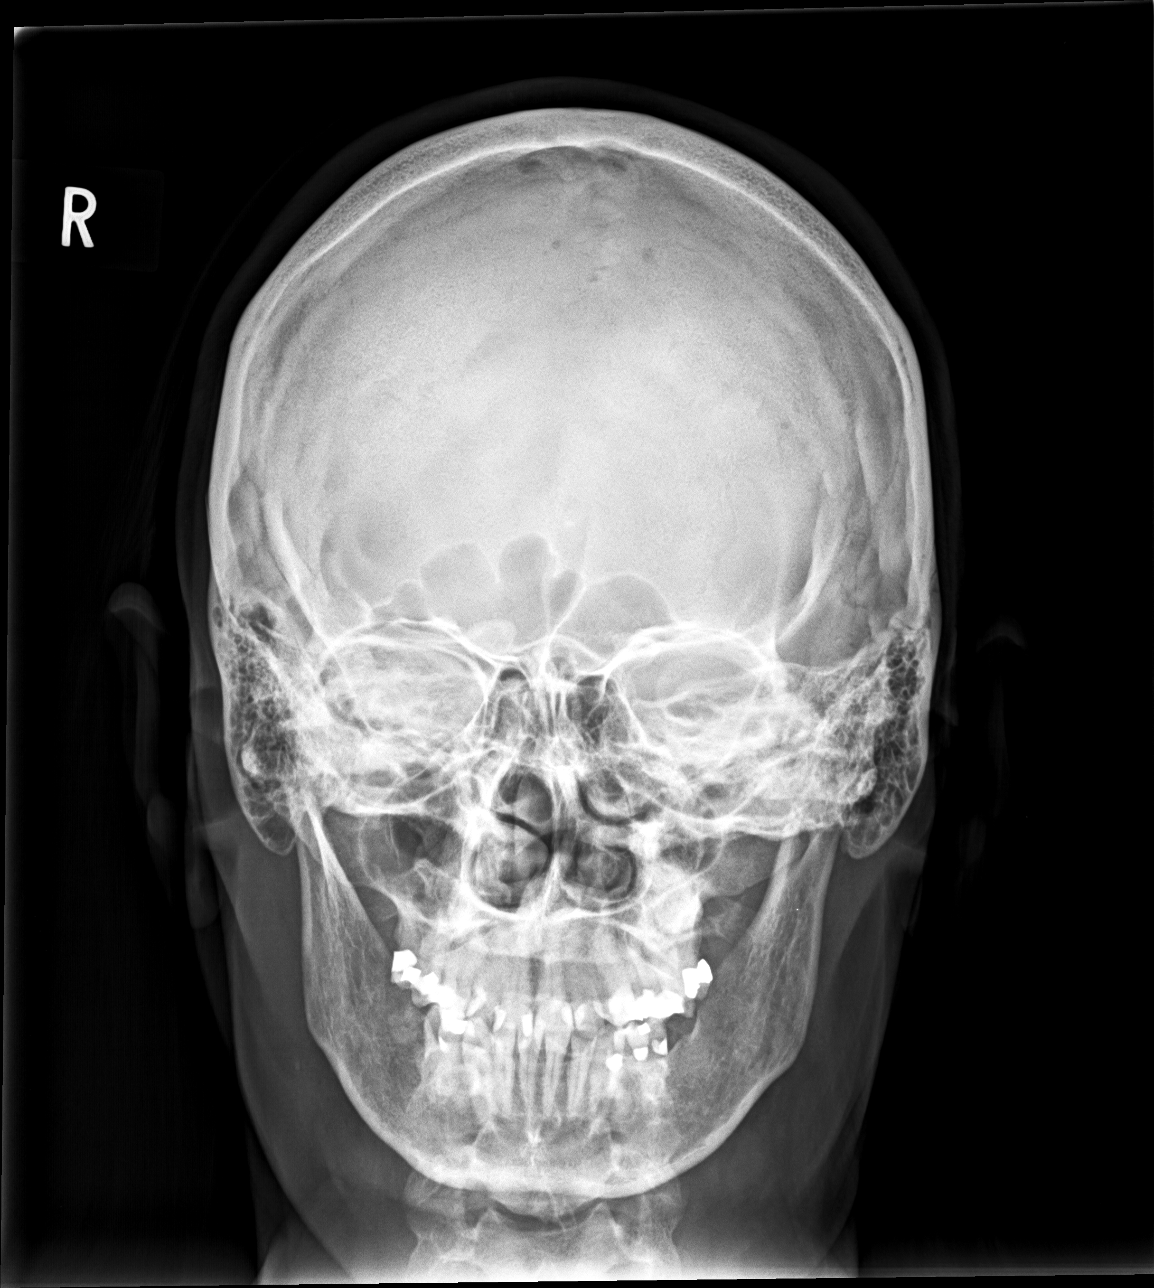

[view not recorded (3 of 3)]
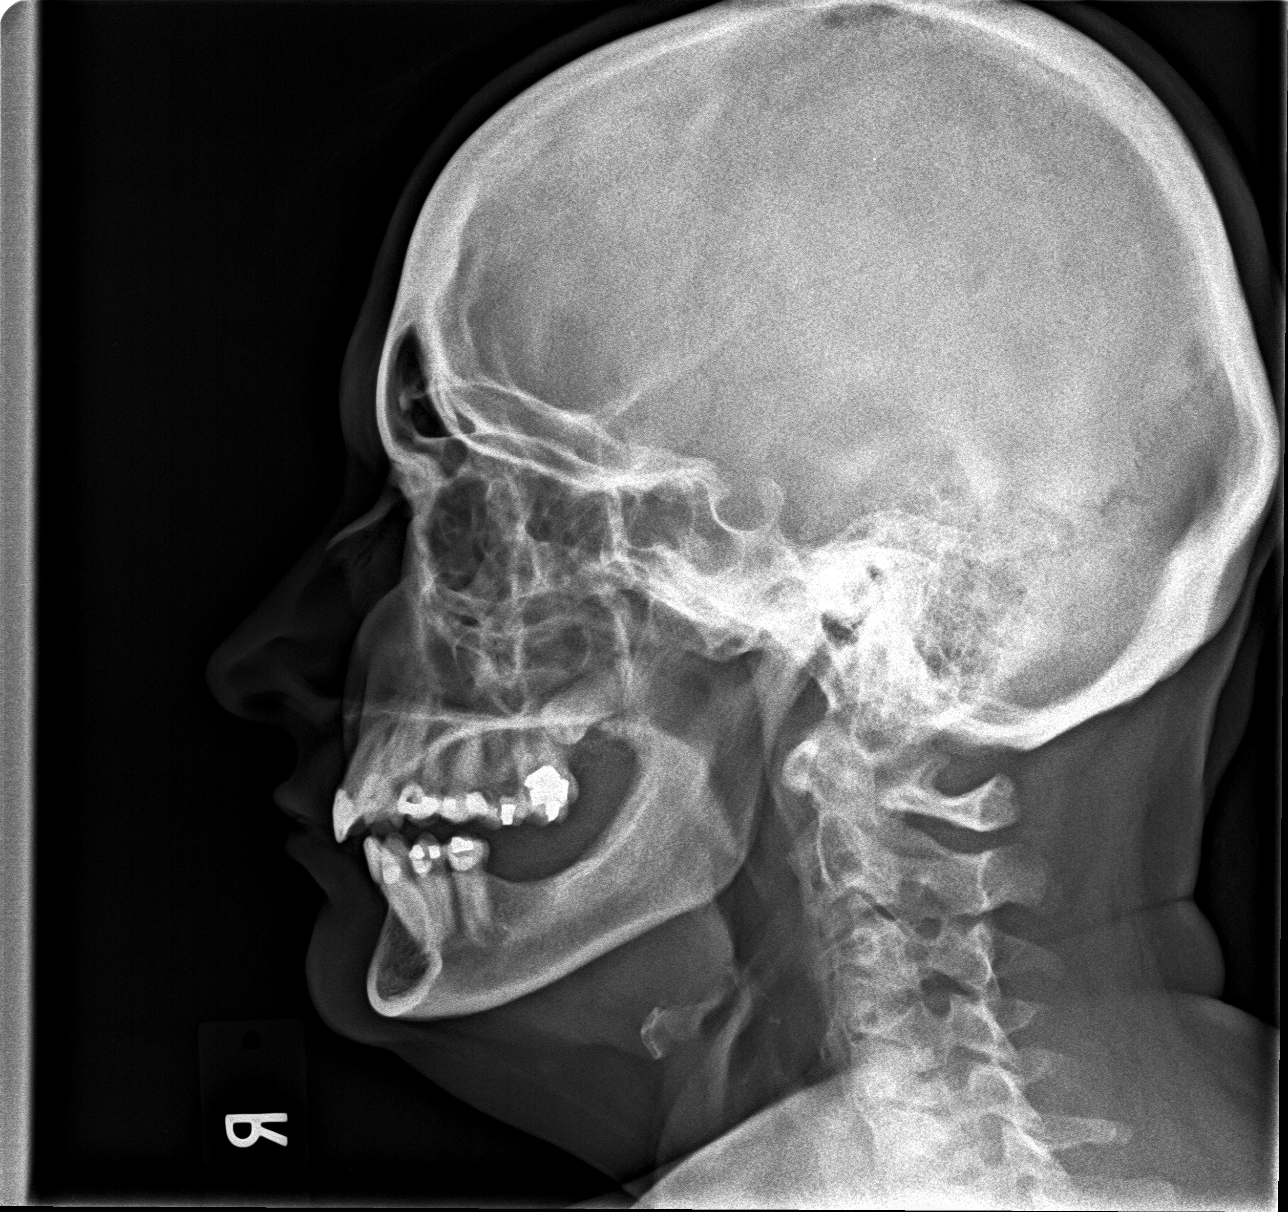

[3 of 3 positions shown; findings below may reference images not displayed]

FINDINGS: The paranasal sinus are aerated. There is no evidence of sinus
opacification air-fluid levels or mucosal thickening. No significant
bone abnormalities are seen.
IMPRESSION: Negative.

## 2014-10-25 ENCOUNTER — Ambulatory Visit: Payer: Medicare Other | Admitting: Family Medicine

## 2014-10-26 ENCOUNTER — Ambulatory Visit (INDEPENDENT_AMBULATORY_CARE_PROVIDER_SITE_OTHER): Payer: Medicare Other | Admitting: Family Medicine

## 2014-10-26 VITALS — BP 122/82 | HR 92 | Temp 98.8°F | Ht 61.0 in | Wt 129.0 lb

## 2014-10-26 DIAGNOSIS — J069 Acute upper respiratory infection, unspecified: Secondary | ICD-10-CM

## 2014-10-26 MED ORDER — LORATADINE 10 MG PO TABS: 10.0000 mg | ORAL_TABLET | Freq: Every day | ORAL | Status: DC

## 2014-10-26 MED ORDER — AZITHROMYCIN 250 MG PO TABS
ORAL_TABLET | ORAL | Status: DC
Start: 2014-10-26 — End: 2014-12-09

## 2014-10-26 MED ORDER — MOMETASONE FUROATE 50 MCG/ACT NA SUSP: 2.0000 | Freq: Every day | NASAL | Status: DC

## 2014-10-26 NOTE — Progress Notes (Signed)
   Subjective:    Patient ID: Sandra Adams, female    DOB: [birth year], 47 y.o.   MRN: [id number]  HPI C/o URI sx's for over a week.  Review of Systems  Constitutional: Negative for fever.  HENT: Negative for ear pain.   Eyes: Negative for discharge.  Respiratory: Negative for cough.   Cardiovascular: Negative for chest pain.  Gastrointestinal: Negative for abdominal distention.  Endocrine: Negative for polyuria.  Genitourinary: Negative for difficulty urinating.  Musculoskeletal: Negative for gait problem and neck pain.  Skin: Negative for color change and rash.  Neurological: Negative for speech difficulty and headaches.  Psychiatric/Behavioral: Negative for agitation.       Objective:    BP 122/82 mmHg  Pulse 92  Temp(Src) 98.8 F (37.1 C) (Oral)  Ht 5\' 1"  (1.549 m)  Wt 129 lb (58.514 kg)  BMI 24.39 kg/m2  LMP 09/19/2014 Physical Exam  Constitutional: She is oriented to person, place, and time. She appears well-developed and well-nourished.  HENT:  Head: Normocephalic and atraumatic.  Mouth/Throat: Oropharynx is clear and moist.  Eyes: Pupils are equal, round, and reactive to light.  Neck: Normal range of motion. Neck supple.  Cardiovascular: Normal rate and regular rhythm.   No murmur heard. Pulmonary/Chest: Effort normal and breath sounds normal.  Abdominal: Soft. Bowel sounds are normal. There is no tenderness.  Neurological: She is alert and oriented to person, place, and time.  Skin: Skin is warm and dry.  Psychiatric: She has a normal mood and affect.          Assessment & Plan:     ICD-9-CM ICD-10-CM   1. URI (upper respiratory infection) 465.9 J06.9 azithromycin (ZITHROMAX) 250 MG tablet     mometasone (NASONEX) 50 MCG/ACT nasal spray     loratadine (CLARITIN) 10 MG tablet   Push po fluids, rest, tylenol and motrin otc prn as directed for fever, arthralgias, and myalgias.  Follow up prn if sx's continue or persist.  No Follow-up on  file.  Lysbeth Penner FNP

## 2014-11-01 ENCOUNTER — Telehealth: Payer: Self-pay | Admitting: Family Medicine

## 2014-11-01 ENCOUNTER — Other Ambulatory Visit: Payer: Self-pay | Admitting: Family Medicine

## 2014-11-01 MED ORDER — AMOXICILLIN 875 MG PO TABS: 875.0000 mg | ORAL_TABLET | Freq: Two times a day (BID) | ORAL | Status: DC

## 2014-11-01 NOTE — Telephone Encounter (Signed)
Patient finished zpak and doesn't need both zpak and amoxicillin and will only rx amoxicillin and this was sent  To pharmacy, please follow up if having questions or not understanding.

## 2014-11-01 NOTE — Telephone Encounter (Signed)
Patient aware.

## 2014-11-26 ENCOUNTER — Other Ambulatory Visit: Payer: Self-pay | Admitting: Family Medicine

## 2014-12-04 ENCOUNTER — Telehealth: Payer: Self-pay | Admitting: Family Medicine

## 2014-12-04 ENCOUNTER — Other Ambulatory Visit: Payer: Self-pay | Admitting: Family Medicine

## 2014-12-04 DIAGNOSIS — R1030 Lower abdominal pain, unspecified: Secondary | ICD-10-CM

## 2014-12-04 NOTE — Telephone Encounter (Signed)
Referral to GI has been made

## 2014-12-04 NOTE — Telephone Encounter (Signed)
Referral to GI in [facility] requested.

## 2014-12-05 NOTE — Telephone Encounter (Signed)
Patient aware, referral to GI done.

## 2014-12-09 ENCOUNTER — Telehealth: Payer: Self-pay | Admitting: *Deleted

## 2014-12-09 ENCOUNTER — Ambulatory Visit (INDEPENDENT_AMBULATORY_CARE_PROVIDER_SITE_OTHER): Payer: PRIVATE HEALTH INSURANCE | Admitting: Physician Assistant

## 2014-12-09 ENCOUNTER — Telehealth: Payer: Self-pay | Admitting: Physician Assistant

## 2014-12-09 ENCOUNTER — Encounter: Payer: Self-pay | Admitting: Physician Assistant

## 2014-12-09 VITALS — BP 108/60 | HR 76 | Ht 61.0 in | Wt 132.4 lb

## 2014-12-09 DIAGNOSIS — R1314 Dysphagia, pharyngoesophageal phase: Secondary | ICD-10-CM

## 2014-12-09 DIAGNOSIS — K219 Gastro-esophageal reflux disease without esophagitis: Secondary | ICD-10-CM

## 2014-12-09 DIAGNOSIS — K625 Hemorrhage of anus and rectum: Secondary | ICD-10-CM

## 2014-12-09 MED ORDER — HYDROCORTISONE 2.5 % RE CREA: 1.0000 "application " | TOPICAL_CREAM | Freq: Two times a day (BID) | RECTAL | Status: DC

## 2014-12-09 MED ORDER — HYDROCORTISONE ACETATE 25 MG RE SUPP: 25.0000 mg | Freq: Two times a day (BID) | RECTAL | Status: DC

## 2014-12-09 NOTE — Telephone Encounter (Signed)
Per patient during office visit today, she had labs drawn at Dr. Tawanna Sat office 2 weeks ago but nothing was in Tea. Call Dr. Tawanna Sat to see if patient had labs drawn and they where just not signed off yet so reason they would not be in Epic. Per Dr. Laurance Flatten, no labs where drawn. LMOM for patient to call back, patient will have to come back to do CBC

## 2014-12-09 NOTE — Patient Instructions (Addendum)
Your physician has requested that you go to the basement for the following lab work before leaving today: CBC( Per your request we will obtain labs that where drawn at your primary care doctor before labs will be drawn here)  You a signed a paper today to do a stool test for Cologuard. [facility] handle this test and they will contact you with the details.  We have sent the following medications to your pharmacy for you to pick up at your convenience: Anusol Suppositories, place one suppository per rectum twice daily for ten days  Please purchase Tucks Wipes over the counter and use as directed.  Please purchase Benefiber over the counter and mix one tablespoon in juice or water, drink once daily. _______________________________________________________________________________________________________________________________________________________________________________________________ Sandra Adams have been scheduled for a Barium Esophogram with tablet at [facility] (1st floor of the hospital) on 12-10-2014 at 11:30 am. Please arrive 15 minutes prior to your appointment for registration. Make certain not to have anything to eat or drink 6 hours prior to your test. If you need to reschedule for any reason, please contact radiology at [phone] to do so. _____________________________________________________________________________________________________________________________________ A barium swallow is an examination that concentrates on views of the esophagus. This tends to be a double contrast exam (barium and two liquids which, when combined, create a gas to distend the wall of the oesophagus) or single contrast (non-ionic iodine based). The study is usually tailored to your symptoms so a good history is essential. Attention is paid during the study to the form, structure and configuration of the esophagus, looking for functional disorders (such as aspiration, dysphagia, achalasia,  motility and reflux) EXAMINATION You may be asked to change into a gown, depending on the type of swallow being performed. A radiologist and radiographer will perform the procedure. The radiologist will advise you of the type of contrast selected for your procedure and direct you during the exam. You will be asked to stand, sit or lie in several different positions and to hold a small amount of fluid in your mouth before being asked to swallow while the imaging is performed .In some instances you may be asked to swallow barium coated marshmallows to assess the motility of a solid food bolus. The exam can be recorded as a digital or video fluoroscopy procedure. POST PROCEDURE It will take 1-2 days for the barium to pass through your system. To facilitate this, it is important, unless otherwise directed, to increase your fluids for the next 24-48hrs and to resume your normal diet.  This test typically takes about 30 minutes to perform. _____________________________________________________________________________________________________________________________________________________________________________________________________ Gastroesophageal Reflux Disease, Adult Gastroesophageal reflux disease (GERD) happens when acid from your stomach flows up into the esophagus. When acid comes in contact with the esophagus, the acid causes soreness (inflammation) in the esophagus. Over time, GERD may create small holes (ulcers) in the lining of the esophagus. CAUSES   Increased body weight. This puts pressure on the stomach, making acid rise from the stomach into the esophagus.  Smoking. This increases acid production in the stomach.  Drinking alcohol. This causes decreased pressure in the lower esophageal sphincter (valve or ring of muscle between the esophagus and stomach), allowing acid from the stomach into the esophagus.  Late evening meals and a full stomach. This increases pressure and acid  production in the stomach.  A malformed lower esophageal sphincter. Sometimes, no cause is found. SYMPTOMS   Burning pain in the lower part of the mid-chest behind the breastbone and in the mid-stomach  area. This may occur twice a week or more often.  Trouble swallowing.  Sore throat.  Dry cough.  Asthma-like symptoms including chest tightness, shortness of breath, or wheezing. DIAGNOSIS  Your caregiver may be able to diagnose GERD based on your symptoms. In some cases, X-rays and other tests may be done to check for complications or to check the condition of your stomach and esophagus. TREATMENT  Your caregiver may recommend over-the-counter or prescription medicines to help decrease acid production. Ask your caregiver before starting or adding any new medicines.  HOME CARE INSTRUCTIONS   Change the factors that you can control. Ask your caregiver for guidance concerning weight loss, quitting smoking, and alcohol consumption.  Avoid foods and drinks that make your symptoms worse, such as:  Caffeine or alcoholic drinks.  Chocolate.  Peppermint or mint flavorings.  Garlic and onions.  Spicy foods.  Citrus fruits, such as oranges, lemons, or limes.  Tomato-based foods such as sauce, chili, salsa, and pizza.  Fried and fatty foods.  Avoid lying down for the 3 hours prior to your bedtime or prior to taking a nap.  Eat small, frequent meals instead of large meals.  Wear loose-fitting clothing. Do not wear anything tight around your waist that causes pressure on your stomach.  Raise the head of your bed 6 to 8 inches with wood blocks to help you sleep. Extra pillows will not help.  Only take over-the-counter or prescription medicines for pain, discomfort, or fever as directed by your caregiver.  Do not take aspirin, ibuprofen, or other nonsteroidal anti-inflammatory drugs (NSAIDs). SEEK IMMEDIATE MEDICAL CARE IF:   You have pain in your arms, neck, jaw, teeth, or  back.  Your pain increases or changes in intensity or duration.  You develop nausea, vomiting, or sweating (diaphoresis).  You develop shortness of breath, or you faint.  Your vomit is green, yellow, black, or looks like coffee grounds or blood.  Your stool is red, bloody, or black. These symptoms could be signs of other problems, such as heart disease, gastric bleeding, or esophageal bleeding. MAKE SURE YOU:   Understand these instructions.  Will watch your condition.  Will get help right away if you are not doing well or get worse. Document Released: 09/15/2005 Document Revised: 02/28/2012 Document Reviewed: 06/25/2011 [facility] Patient Information 2015 [place], [place]. This information is not intended to replace advice given to you by your health care provider. Make sure you discuss any questions you have with your health care provider.

## 2014-12-09 NOTE — Telephone Encounter (Signed)
Called patient back. Patient stated that she cannot afford the suppositories we sent today. Patient stated she will also be looking at the price of Prep H cream/Suppositories over the counter. I advised patient that I will ask Cecille Rubin what else we can send in.  I advised patient that I called her primary care doctor--Dr. Laurance Flatten and they stated they do not have her lab results. Patient stated she did do the lab work and she will go by her primary care doctor to get the lab results. Patient stated that she will bring the lab results to our office the day she goes for her Barium Swallow test.

## 2014-12-09 NOTE — Progress Notes (Signed)
Patient ID: Sandra Adams, female   DOB: May 09, 1966, 47 y.o.   MRN: [id number]    HPI:    Sandra Adams is a 48 year old female referred for evaluation by Stevan Born, FNP, due to lower abdominal pain.  Patient states she has had intermittent lower abdominal pain for the past several years. She states she will eat, developed lower abdominal cramping. In have a bowel movement. She frequently has a sensation of incomplete evacuation. For the past couple of years, her stools have alternated between hard nugget's, formed stools, and loose stools. She sometimes strains to move her bowels. She has had episodes of blood on the toilet tissue but no blood striping on the stool or dripping in the commode. She's had no anorexia or weight loss. There is no known family history of colon cancer, colon polyps, or inflammatory bowel disease.  She also has a history of heartburn. She says she has a hiatal hernia. For the past several months she feels like solids get stuck halfway down her esophagus. She has had episodes of regurgitation but has not had to spit her food out because she can't swallow it. She has no dysphagia to liquids. She has no globus sensation. She has no epigastric pain, nausea, or vomiting.   Past Medical History  Diagnosis Date  . Anxiety   . Depression   . Migraine   . Scoliosis     Past Surgical History  Procedure Laterality Date  . Knee surgery     Family History  Problem Relation Age of Onset  . Epilepsy Mother   . COPD Father   . COPD Sister   . COPD Maternal Aunt   . Colon cancer Neg Hx   . Colon polyps Neg Hx   . Diabetes Maternal Uncle   . Diabetes Maternal Aunt     x2  . Esophageal cancer Neg Hx   . Kidney disease Sister   . Gallbladder disease Neg Hx    History  Substance Use Topics  . Smoking status: Never Smoker   . Smokeless tobacco: Never Used  . Alcohol Use: No   Current Outpatient Prescriptions  Medication Sig Dispense Refill  . ALPRAZolam (XANAX) 0.25 MG  tablet Take 1 tablet (0.25 mg total) by mouth at bedtime as needed for anxiety. 30 tablet 1  . cloNIDine (CATAPRES) 0.1 MG tablet One po qhs prn hot flashes 30 tablet 11  . estradiol (ESTRACE) 1 MG tablet Take 1 tablet (1 mg total) by mouth daily. 30 tablet 11  . ibuprofen (ADVIL,MOTRIN) 800 MG tablet TAKE 1 TABLET (800 MG TOTAL) BY MOUTH 3 (THREE) TIMES DAILY. 60 tablet 1  . loratadine (CLARITIN) 10 MG tablet Take 1 tablet (10 mg total) by mouth daily. (Patient taking differently: Take 10 mg by mouth as needed. ) 30 tablet 11  . medroxyPROGESTERone (PROVERA) 2.5 MG tablet Take 1 tablet (2.5 mg total) by mouth daily. 30 tablet 11  . omeprazole (PRILOSEC) 20 MG capsule Take 1 capsule (20 mg total) by mouth daily. 90 capsule 3  . sertraline (ZOLOFT) 50 MG tablet Take 1 tablet (50 mg total) by mouth daily. 90 tablet 4  . hydrocortisone (ANUSOL-HC) 25 MG suppository Place 1 suppository (25 mg total) rectally 2 (two) times daily. 12 suppository 0   No current facility-administered medications for this visit.   Allergies  Allergen Reactions  . Ciprofloxacin     Makes her feel weak,dizzy  . Imitrex [Sumatriptan]     Blurred vision   . Prednisone  Nausea And Vomiting  . Tylenol [Acetaminophen]     Tylenol #3,Headaches  . Vicodin [Hydrocodone-Acetaminophen]     GI upset     Review of Systems: Gen: Denies any fever, chills, sweats, anorexia, fatigue, weakness, malaise, weight loss, and sleep disorder CV: Denies chest pain, angina, palpitations, syncope, orthopnea, PND, peripheral edema, and claudication. Resp: Denies dyspnea at rest, dyspnea with exercise, cough, sputum, wheezing, coughing up blood, and pleurisy. GI: Denies vomiting blood, jaundice, and fecal incontinence.   Has dysphagia to solids. GU : Denies urinary burning, blood in urine, urinary frequency, urinary hesitancy, nocturnal urination, and urinary incontinence. MS: Denies joint pain, limitation of movement, and swelling,  stiffness, low back pain, extremity pain. Denies muscle weakness, cramps, atrophy.  Derm: Denies rash, itching, dry skin, hives, moles, warts, or unhealing ulcers.  Psych: Denies depression, anxiety, memory loss, suicidal ideation, hallucinations, paranoia, and confusion. Heme: Denies bruising, bleeding, and enlarged lymph nodes. Neuro:  Denies any headaches, dizziness, paresthesias. Endo:  Denies any problems with DM, thyroid, adrenal function  Studies: Complete   Status: Final result       PACS Images     Show images for US Pelvis Complete     Study Result     CLINICAL DATA: Dysmenorrhea ; past history of fibroids, abnormal endometrium  EXAM: TRANSABDOMINAL AND TRANSVAGINAL ULTRASOUND OF PELVIS  TECHNIQUE: Both transabdominal and transvaginal ultrasound examinations of the pelvis were performed. Transabdominal technique was performed for global imaging of the pelvis including uterus, ovaries, adnexal regions, and pelvic cul-de-sac. It was necessary to proceed with endovaginal exam following the transabdominal exam to visualize the endometrium and ovaries.  COMPARISON: 08/06/2011  FINDINGS: Uterus  Measurements: 7.0 x 3.6 x 3.7 cm. Two small probable uterine leiomyomata noted, intramural posteriorly at upper uterine segment 15 x 10 x 16 mm (previously 4 x 6 x 4 mm) and submucosal at fundus 9 x 10 x 9 mm (not definitely visualized previously).  Endometrium  Thickness: 7 mm thick. Normal appearance without fluid or focal abnormality.  Right ovary  Measurements: 2.4 x 1.1 x 1.1 cm. Normal morphology without mass.  Left ovary  Measurements: 2.8 x 1.2 x 1.0 cm. Normal morphology without mass.  Other findings  No free pelvic fluid or adnexal masses.  IMPRESSION: Small probable uterine leiomyomata as above.  Otherwise negative exam.   Electronically Signed  By: Lavonia Dana M.D.  On: 05/27/2014 14:58       Physical Exam: BP  108/60 mmHg  Pulse 76  Ht 5\' 1"  (1.549 m)  Wt 132 lb 6 oz (60.045 kg)  BMI 25.02 kg/m2  LMP 10/16/2014 Constitutional: Pleasant,well-developed female in no acute distress. HEENT: Normocephalic and atraumatic. Conjunctivae are normal. No scleral icterus. Neck supple. No thyromegaly Cardiovascular: Normal rate, regular rhythm.  Pulmonary/chest: Effort normal and breath sounds normal. No wheezing, rales or rhonchi. Abdominal: Soft, nondistended, nontender. Bowel sounds active throughout. There are no masses palpable. No hepatomegaly. Rectal: hemorrhoids noted Extremities: no edema Lymphadenopathy: No cervical adenopathy noted. Neurological: Alert and oriented to person place and time. Skin: Skin is warm and dry. No rashes noted. Psychiatric: Normal mood and affect. Behavior is normal.  ASSESSMENT AND PLAN: #1. Rectal bleeding. This is likely hemorrhoidal in nature. She will be given a trial of Anusol HC suppositories 1 per rectum twice a day for 10 days. She has been advised to undergo a colonoscopy to screen for polyps or neoplasia however she is fairly adamant that she does not want a colonoscopy at this  time. She has heard about colo guard from some friends and is willing to have coldouard testing. It was explained to her that if the colo guard testing is positive she may need a colonoscopy and she is agreeable to this only if the colo guard is positive.  #2. Erratic bowel movements. These are likely due to some IBS. She's been advised to adhere to a high-fiber low-fat diet and to use Benefiber 1 tablespoon daily.  #3 dysphagia and heartburn. She's been advised to adhere to an anti-reflux regimen. She will continue her daily omeprazole before breakfast. She will be scheduled for a barium esophagram with pill to evaluate for possible stricture etc. we have discussed an EGD but the patient is adamant that she prefers to have the esophagogram first and scheduled the EGD only if  necessary.    Alexy Bringle, Vita Barley PA-C 12/09/2014, 11:36 AM

## 2014-12-09 NOTE — Progress Notes (Signed)
i agree with the above note, plan 

## 2014-12-10 ENCOUNTER — Ambulatory Visit (HOSPITAL_COMMUNITY): Payer: PRIVATE HEALTH INSURANCE

## 2014-12-24 ENCOUNTER — Telehealth: Payer: Self-pay | Admitting: *Deleted

## 2014-12-24 ENCOUNTER — Ambulatory Visit (HOSPITAL_COMMUNITY)
Admission: RE | Admit: 2014-12-24 | Discharge: 2014-12-24 | Disposition: A | Discharge status: Home / Self Care | Payer: Medicare Other | Source: Ambulatory Visit | Attending: Physician Assistant | Admitting: Physician Assistant

## 2014-12-24 DIAGNOSIS — R1310 Dysphagia, unspecified: Secondary | ICD-10-CM | POA: Insufficient documentation

## 2014-12-24 DIAGNOSIS — K449 Diaphragmatic hernia without obstruction or gangrene: Secondary | ICD-10-CM | POA: Insufficient documentation

## 2014-12-24 DIAGNOSIS — R1314 Dysphagia, pharyngoesophageal phase: Secondary | ICD-10-CM

## 2014-12-24 DIAGNOSIS — K219 Gastro-esophageal reflux disease without esophagitis: Secondary | ICD-10-CM | POA: Insufficient documentation

## 2014-12-24 NOTE — Telephone Encounter (Signed)
Called [facility] [facility] at [phone] to see if patient did her Cologuard test. Per Lovey Newcomer patient had to redo her Cologuard test because she did it wrong. New kit was mailed and received by patient on 12-17-2014, patient was educated on how to do test correctly. Cologuard waiting on test to be mailed back to them.  LMOM for patient to call back, making sure she did test and mailed it back.

## 2014-12-26 ENCOUNTER — Ambulatory Visit (INDEPENDENT_AMBULATORY_CARE_PROVIDER_SITE_OTHER): Payer: 59 | Admitting: Family Medicine

## 2014-12-26 ENCOUNTER — Encounter: Payer: Self-pay | Admitting: Family Medicine

## 2014-12-26 ENCOUNTER — Telehealth: Payer: Self-pay | Admitting: Family Medicine

## 2014-12-26 VITALS — BP 118/75 | HR 89 | Temp 98.0°F | Ht 61.0 in | Wt 131.8 lb

## 2014-12-26 DIAGNOSIS — J0110 Acute frontal sinusitis, unspecified: Secondary | ICD-10-CM

## 2014-12-26 MED ORDER — SALINE SPRAY 0.65 % NA SOLN: 1.0000 | NASAL | Status: DC | PRN

## 2014-12-26 MED ORDER — MOMETASONE FUROATE 50 MCG/ACT NA SUSP: 2.0000 | Freq: Every day | NASAL | Status: DC

## 2014-12-26 MED ORDER — PSEUDOEPHEDRINE-GUAIFENESIN ER 60-600 MG PO TB12: 1.0000 | ORAL_TABLET | Freq: Two times a day (BID) | ORAL | Status: DC

## 2014-12-26 MED ORDER — AMOXICILLIN-POT CLAVULANATE 875-125 MG PO TABS: 1.0000 | ORAL_TABLET | Freq: Two times a day (BID) | ORAL | Status: DC

## 2014-12-26 NOTE — Patient Instructions (Signed)
Vent kerosene heat. Use cool mist vaporizer Complete all of the antibioitic

## 2014-12-26 NOTE — Telephone Encounter (Signed)
Patient scheduled for AM appointment.

## 2014-12-26 NOTE — Progress Notes (Signed)
Subjective:    Patient ID: Sandra Adams, female    DOB: [birth year], 49 y.o.   MRN: [id number]  HPI Sinus Pain: Patient complains of moderate congestion, facial pain, headache described as frontal, moderate pressure, nasal congestion, no  fever and non productive cough. Symptoms include congestion, facial pain, nasal congestion, no  fever, non productive cough, post nasal drip and sinus pressure with no fever, chills, night sweats or weight loss. Onset of symptoms was 4 days ago, gradually worsening since that time. She is drinking moderate amounts of fluids.  Past history is significant for sinus infections and migraines.   Review of Systems  Constitutional: Negative.   HENT: Positive for congestion, postnasal drip, rhinorrhea, sinus pressure and sore throat. Negative for ear discharge, ear pain, facial swelling, hearing loss, mouth sores, nosebleeds and sneezing.   Eyes: Positive for pain and itching. Negative for photophobia, redness and visual disturbance.  Respiratory: Positive for cough. Negative for apnea, choking, chest tightness, shortness of breath, wheezing and stridor.   Cardiovascular: Negative.   Neurological: Positive for headaches. Negative for dizziness, tremors, syncope, facial asymmetry, speech difficulty, weakness, light-headedness and numbness.  Hematological: Does not bruise/bleed easily.  Psychiatric/Behavioral: Negative.        Objective:   Physical Exam      Assessment & Plan:   Subjective:     Sandra Adams is a 49 y.o. female who presents for evaluation of sinus pain. Symptoms include: The patient complains of congestion, post nasal drip, sinus pain and scant nasal mucous with cough for 4 days. Severity is moderate. Review of Systems    Objective:    BP 118/75 mmHg  Pulse 89  Temp(Src) 98 F (36.7 C) (Oral)  Ht 5\' 1"  (1.549 m)  Wt 131 lb 12.8 oz (59.784 kg)  BMI 24.92 kg/m2  LMP 10/16/2014  General Appearance:    Alert, cooperative, no  distress, appears stated age  Head:    Normocephalic, without obvious abnormality, atraumatic  Eyes:    PERRL, conjunctiva/corneas clear, EOM's intact  Ears:    Normal TM's and external ear canals, both ears  Nose:   Nares erythematous, minimal blood on right. septum midline,  no drainage    moderate sinus tenderness  Throat:   Lips, mucosa, and tongue normal; teeth and gums normal  Neck:   Supple, symmetrical, trachea midline, no adenopathy;    thyroid:  no enlargement/tenderness/nodules; no carotid   bruit or JVD  Back:     Symmetric, no curvature, ROM normal, no CVA tenderness  Lungs:     Clear to auscultation bilaterally, respirations unlabored  Chest Wall:    No tenderness or deformity   Heart:    Regular rate and rhythm, S1 and S2 normal, no murmur, rub   or gallop  Breast Exam:    No tenderness, masses, or nipple abnormality  Abdomen:     Soft, non-tender, bowel sounds active all four quadrants,    no masses, no organomegaly  Genitalia:    Normal female without lesion, discharge or tenderness  Rectal:    Normal tone, normal prostate, no masses or tenderness;   guaiac negative stool  Extremities:   Extremities normal, atraumatic, no cyanosis or edema  Pulses:   2+ and symmetric all extremities  Skin:   Skin color, texture, turgor normal, no rashes or lesions  Lymph nodes:   Cervical, supraclavicular, and axillary nodes normal  Neurologic:   CNII-XII intact, normal strength, sensation and reflexes    throughout  Assessment:    Acute bacterial sinusitis.    Plan:    Nasal saline sprays. Nasal steroids per medication orders. Augmentin per medication orders.

## 2014-12-31 ENCOUNTER — Telehealth: Payer: Self-pay | Admitting: Family Medicine

## 2014-12-31 ENCOUNTER — Telehealth: Payer: Self-pay

## 2014-12-31 NOTE — Telephone Encounter (Signed)
Please complete antibiotic and give a while longer to work.  Drink extra fluids and rest.  Could be viral.  Call later if no improvement.

## 2014-12-31 NOTE — Telephone Encounter (Signed)
Received fax from [employer] stating Cologuard sample could not be processed due to mislabeling. Called patient to make sure she is getting a second kit from [employer] and that she is going to label the sample. Patient states she did label the sample and is not sure what she did wrong. I told her to ask [facility] if she needs to do something differently. Patient states they are sending a new kit for her but she has not received it yet. Told patient to contact us if she does not receive kit.

## 2015-01-01 ENCOUNTER — Ambulatory Visit (INDEPENDENT_AMBULATORY_CARE_PROVIDER_SITE_OTHER): Payer: 59 | Admitting: Family Medicine

## 2015-01-01 ENCOUNTER — Encounter: Payer: Self-pay | Admitting: Family Medicine

## 2015-01-01 VITALS — BP 126/81 | HR 89 | Temp 97.0°F | Ht 61.0 in | Wt 130.5 lb

## 2015-01-01 DIAGNOSIS — J206 Acute bronchitis due to rhinovirus: Secondary | ICD-10-CM

## 2015-01-01 MED ORDER — ALBUTEROL SULFATE HFA 108 (90 BASE) MCG/ACT IN AERS: 2.0000 | INHALATION_SPRAY | Freq: Four times a day (QID) | RESPIRATORY_TRACT | Status: DC | PRN

## 2015-01-01 MED ORDER — BENZONATATE 100 MG PO CAPS: 100.0000 mg | ORAL_CAPSULE | Freq: Three times a day (TID) | ORAL | Status: DC | PRN

## 2015-01-01 MED ORDER — AZITHROMYCIN 250 MG PO TABS: ORAL_TABLET | ORAL | Status: DC

## 2015-01-01 MED ORDER — LEVALBUTEROL HCL 1.25 MG/0.5ML IN NEBU: 1.2500 mg | INHALATION_SOLUTION | Freq: Once | RESPIRATORY_TRACT | Status: DC

## 2015-01-01 NOTE — Progress Notes (Signed)
   Subjective:    Patient ID: Sandra Adams, female    DOB: [birth year], 49 y.o.   MRN: [id number]  HPI C/o persistent cough and uri sx's.  Review of Systems  Constitutional: Negative for fever.  HENT: Negative for ear pain.   Eyes: Negative for discharge.  Respiratory: Negative for cough.   Cardiovascular: Negative for chest pain.  Gastrointestinal: Negative for abdominal distention.  Endocrine: Negative for polyuria.  Genitourinary: Negative for difficulty urinating.  Musculoskeletal: Negative for gait problem and neck pain.  Skin: Negative for color change and rash.  Neurological: Negative for speech difficulty and headaches.  Psychiatric/Behavioral: Negative for agitation.       Objective:    BP 126/81 mmHg  Pulse 89  Temp(Src) 97 F (36.1 C) (Oral)  Ht 5\' 1"  (1.549 m)  Wt 130 lb 8 oz (59.194 kg)  BMI 24.67 kg/m2  LMP 10/16/2014 Physical Exam  Constitutional: She is oriented to person, place, and time. She appears well-developed and well-nourished.  HENT:  Head: Normocephalic and atraumatic.  Mouth/Throat: Oropharynx is clear and moist.  Eyes: Pupils are equal, round, and reactive to light.  Neck: Normal range of motion. Neck supple.  Cardiovascular: Normal rate and regular rhythm.   No murmur heard. Pulmonary/Chest: Effort normal and breath sounds normal.  Abdominal: Soft. Bowel sounds are normal. There is no tenderness.  Neurological: She is alert and oriented to person, place, and time.  Skin: Skin is warm and dry.  Psychiatric: She has a normal mood and affect.          Assessment & Plan:     ICD-9-CM ICD-10-CM   1. Acute bronchitis due to Rhinovirus 466.0 J20.6 azithromycin (ZITHROMAX) 250 MG tablet   079.3  levalbuterol (XOPENEX) nebulizer solution 1.25 mg     benzonatate (TESSALON PERLES) 100 MG capsule     albuterol (PROVENTIL HFA;VENTOLIN HFA) 108 (90 BASE) MCG/ACT inhaler     No Follow-up on file.  Lysbeth Penner FNP

## 2015-01-03 IMAGING — US US PELVIS COMPLETE
1 series · 13 of 25 positions shown · non-contrast
Comparison: 08/06/2011

CLINICAL DATA: Dysmenorrhea ; past history of fibroids, abnormal
endometrium

EXAM:
TRANSABDOMINAL AND TRANSVAGINAL ULTRASOUND OF PELVIS
TECHNIQUE: Both transabdominal and transvaginal ultrasound examinations of the
pelvis were performed. Transabdominal technique was performed for
global imaging of the pelvis including uterus, ovaries, adnexal
regions, and pelvic cul-de-sac. It was necessary to proceed with
endovaginal exam following the transabdominal exam to visualize the
endometrium and ovaries.

[Series 1: us pelvis complete · 13 of 67 slices shown]
[im 1/67]
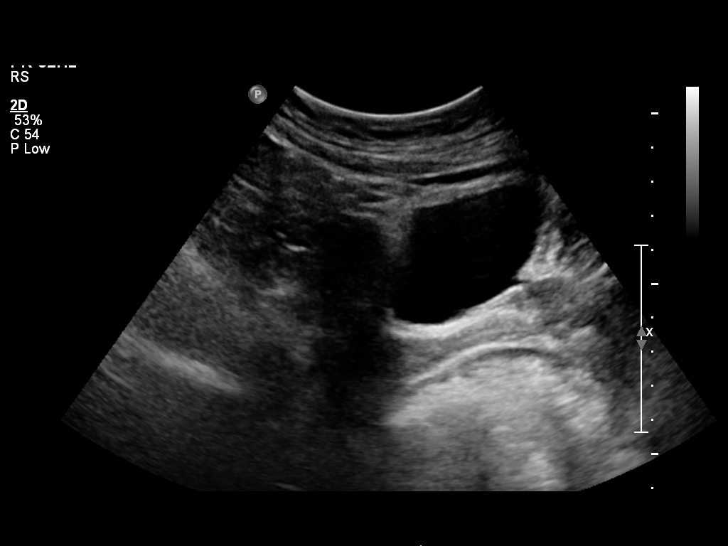
[im 6/67]
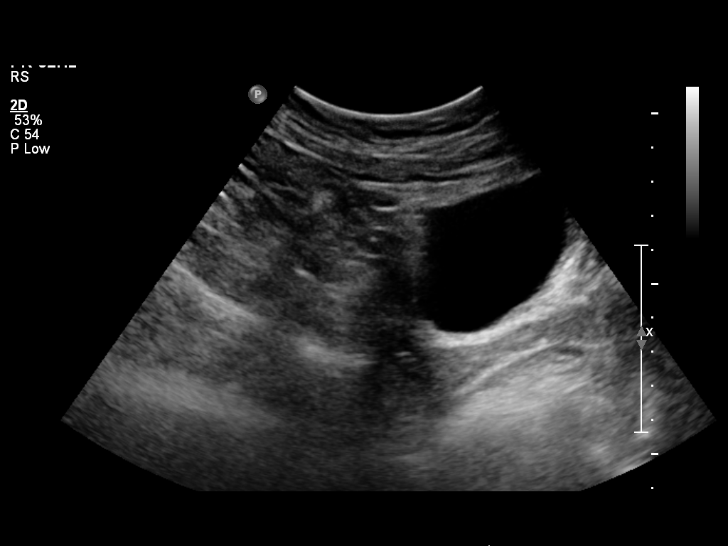
[im 12/67]
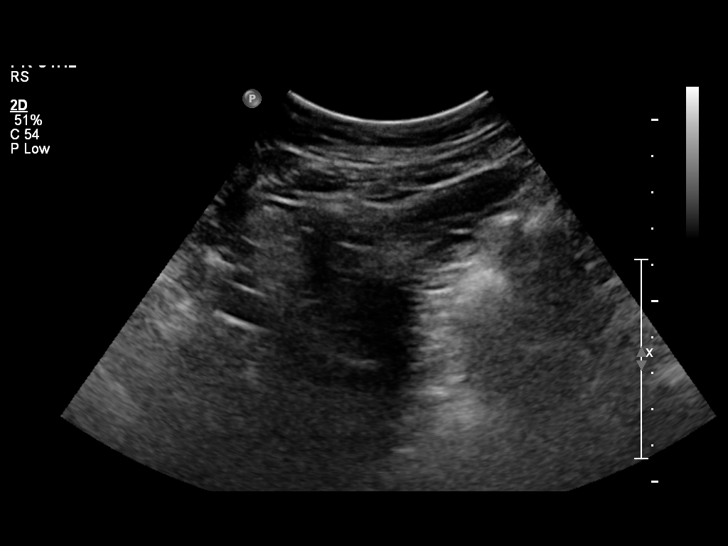
[im 17/67]
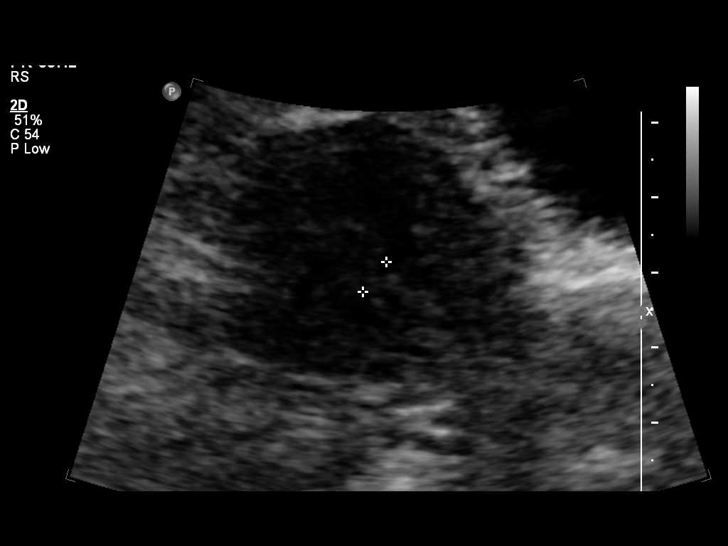
[im 23/67]
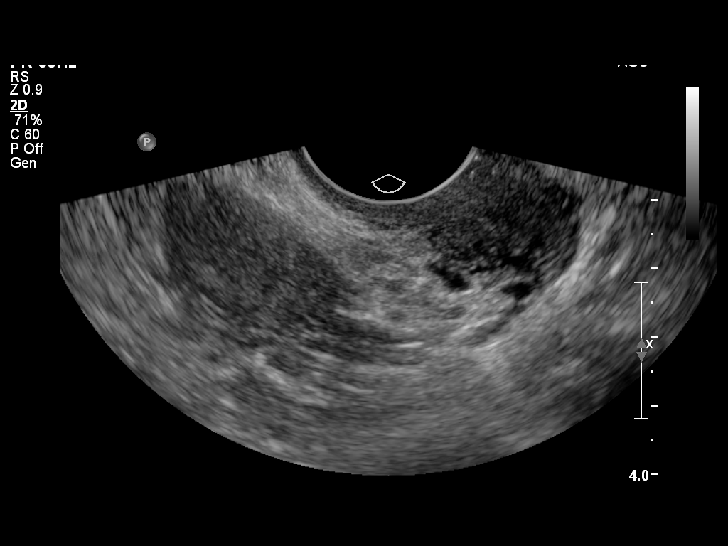
[im 28/67]
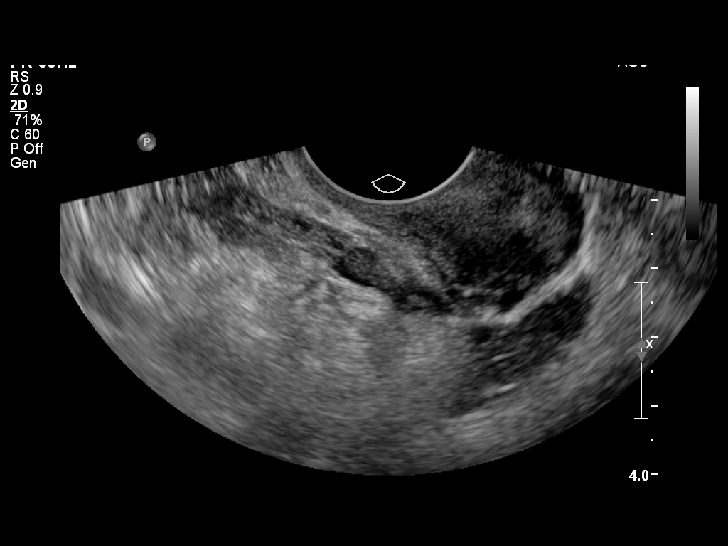
[im 34/67]
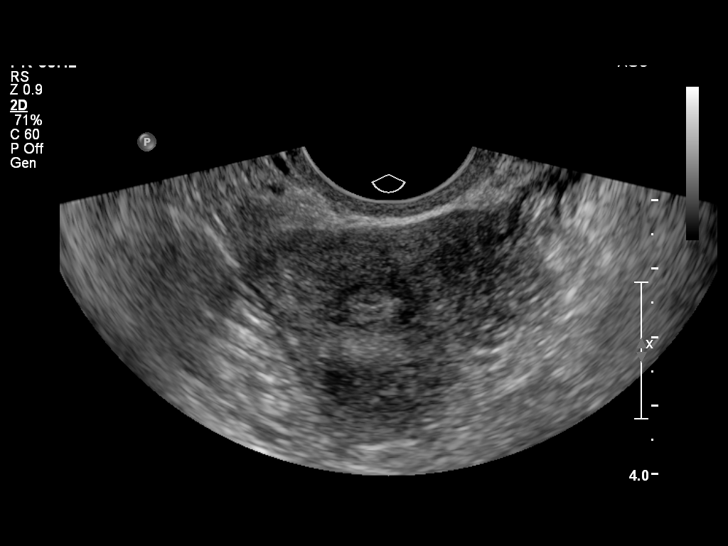
[im 39/67]
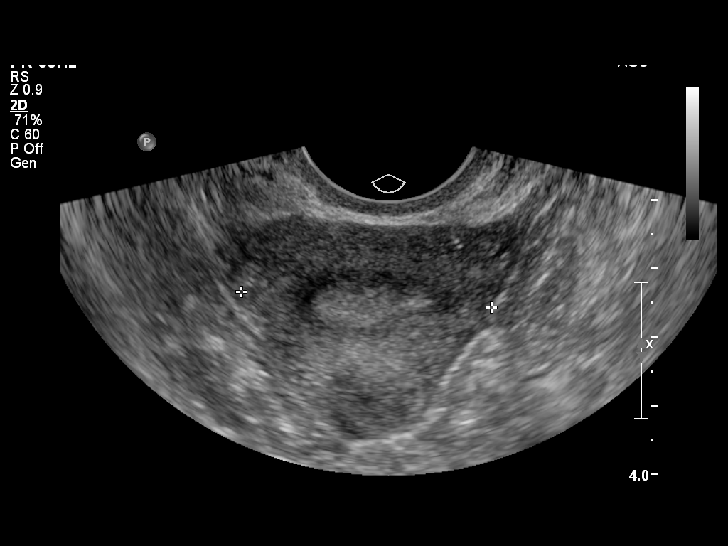
[im 45/67]
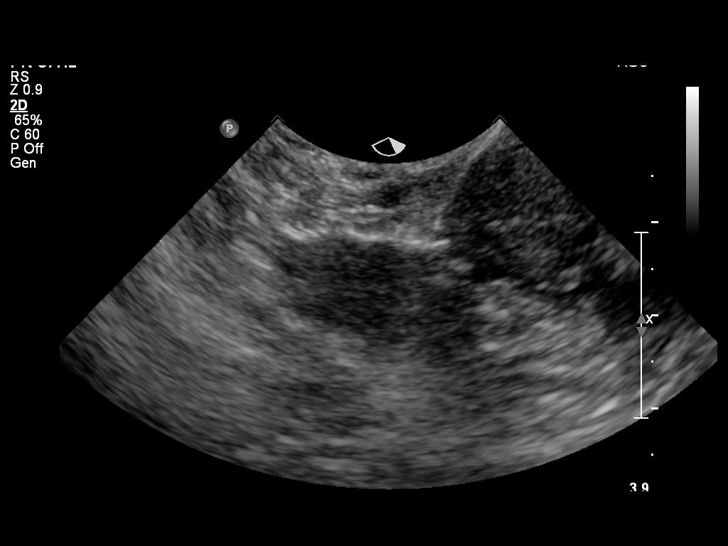
[im 50/67]
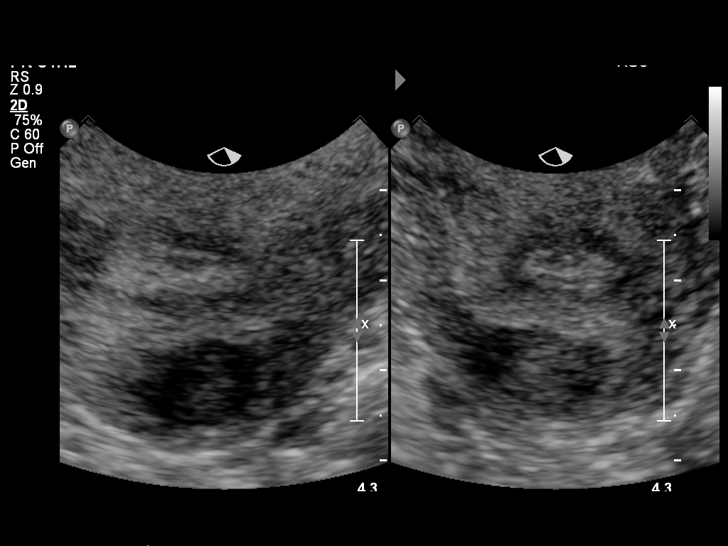
[im 56/67]
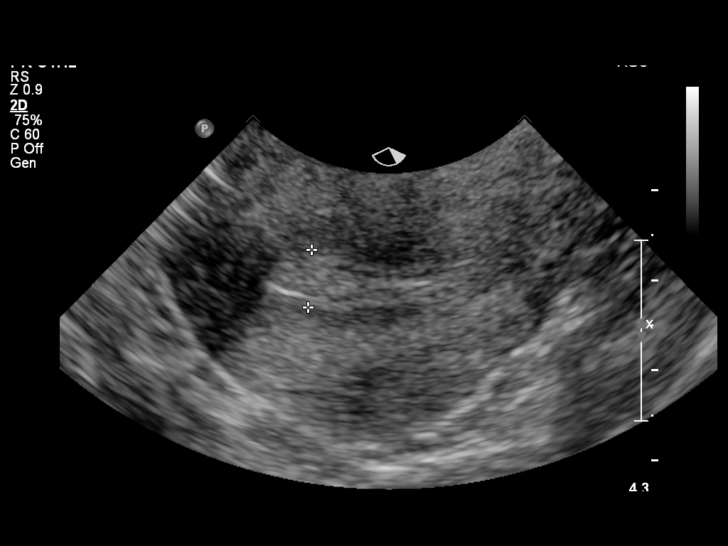
[im 61/67]
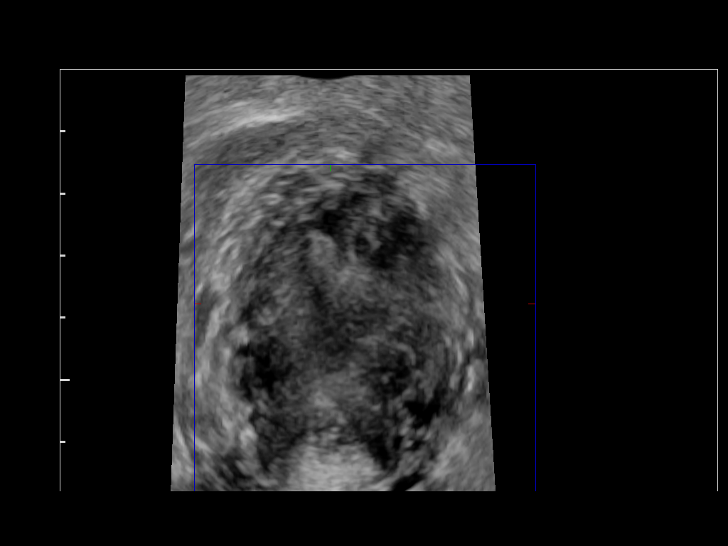
[im 67/67]
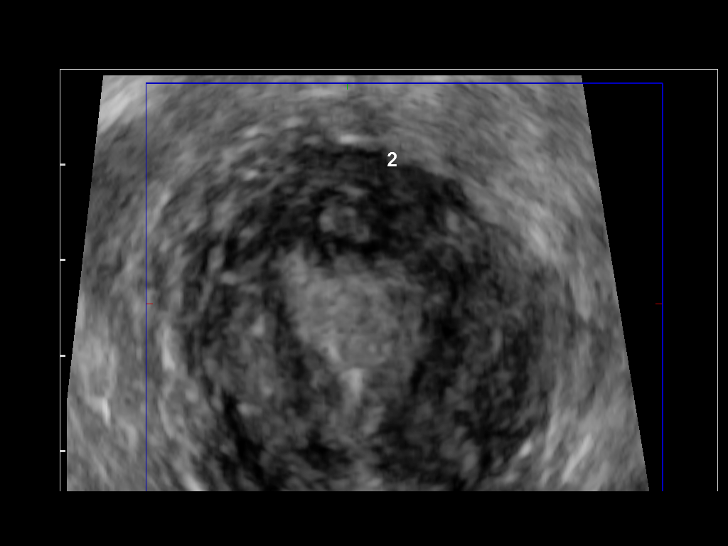

[13 of 25 positions shown; findings below may reference images not displayed]

FINDINGS: Uterus

Measurements: 7.0 x 3.6 x 3.7 cm. Two small probable uterine
leiomyomata noted, intramural posteriorly at upper uterine segment
15 x 10 x 16 mm (previously 4 x 6 x 4 mm) and submucosal at fundus 9
x 10 x 9 mm (not definitely visualized previously).

Endometrium

Thickness: 7 mm thick. Normal appearance without fluid or focal
abnormality.

Right ovary

Measurements: 2.4 x 1.1 x 1.1 cm. Normal morphology without mass.

Left ovary

Measurements: 2.8 x 1.2 x 1.0 cm. Normal morphology without mass.

Other findings

No free pelvic fluid or adnexal masses.
IMPRESSION: Small probable uterine leiomyomata as above.

Otherwise negative exam.

## 2015-01-06 ENCOUNTER — Telehealth: Payer: Self-pay | Admitting: Family Medicine

## 2015-01-06 NOTE — Telephone Encounter (Signed)
Stp advised she would ntbs for a refill on the antbx, pt never picked up the tessalon perles as it was $16 and she couldn't afford it. Advised pt to try using saline nasal flush or mucinex otc, pt voiced understanding.

## 2015-01-08 LAB — COLOGUARD

## 2015-01-09 ENCOUNTER — Telehealth: Payer: Self-pay | Admitting: *Deleted

## 2015-01-09 NOTE — Telephone Encounter (Signed)
We received a fax from [employer] on [employer] 01-09-2015.  The fax cover was the only thing that came over. I called [employer], spoke to [place].  She said the first test was not able to be processed.  They have contacted the patient to advise this and they are mailing her another kit.

## 2015-01-13 DIAGNOSIS — M25562 Pain in left knee: Secondary | ICD-10-CM | POA: Diagnosis not present

## 2015-01-13 DIAGNOSIS — M25462 Effusion, left knee: Secondary | ICD-10-CM | POA: Diagnosis not present

## 2015-01-13 DIAGNOSIS — S8992XA Unspecified injury of left lower leg, initial encounter: Secondary | ICD-10-CM | POA: Diagnosis not present

## 2015-01-14 ENCOUNTER — Encounter: Payer: Self-pay | Admitting: Physician Assistant

## 2015-01-17 DIAGNOSIS — Z1211 Encounter for screening for malignant neoplasm of colon: Secondary | ICD-10-CM | POA: Diagnosis not present

## 2015-01-17 DIAGNOSIS — Z1212 Encounter for screening for malignant neoplasm of rectum: Secondary | ICD-10-CM | POA: Diagnosis not present

## 2015-01-17 LAB — COLOGUARD: Cologuard: NEGATIVE

## 2015-01-24 ENCOUNTER — Telehealth: Payer: Self-pay | Admitting: *Deleted

## 2015-01-24 NOTE — Telephone Encounter (Signed)
I have spoken to patient to advise that Cecille Rubin Hvozdovic, PA-C has reviewed her cologuard results and they are negative or normal. Patient verbalizes understanding.

## 2015-01-28 ENCOUNTER — Ambulatory Visit (INDEPENDENT_AMBULATORY_CARE_PROVIDER_SITE_OTHER): Payer: 59 | Admitting: Family Medicine

## 2015-01-28 ENCOUNTER — Encounter: Payer: Self-pay | Admitting: Family Medicine

## 2015-01-28 VITALS — BP 109/63 | HR 92 | Temp 97.1°F | Ht 61.0 in | Wt 136.0 lb

## 2015-01-28 DIAGNOSIS — R350 Frequency of micturition: Secondary | ICD-10-CM | POA: Diagnosis not present

## 2015-01-28 DIAGNOSIS — N3090 Cystitis, unspecified without hematuria: Secondary | ICD-10-CM | POA: Diagnosis not present

## 2015-01-28 DIAGNOSIS — J0101 Acute recurrent maxillary sinusitis: Secondary | ICD-10-CM

## 2015-01-28 LAB — POCT URINALYSIS DIPSTICK
Bilirubin, UA: NEGATIVE
Glucose, UA: NEGATIVE
Ketones, UA: NEGATIVE
NITRITE UA: NEGATIVE
PROTEIN UA: NEGATIVE
SPEC GRAV UA: 1.025
UROBILINOGEN UA: NEGATIVE
pH, UA: 5

## 2015-01-28 LAB — POCT UA - MICROSCOPIC ONLY
Bacteria, U Microscopic: NEGATIVE
CRYSTALS, UR, HPF, POC: NEGATIVE
Casts, Ur, LPF, POC: NEGATIVE
MUCUS UA: NEGATIVE
Yeast, UA: NEGATIVE

## 2015-01-28 MED ORDER — AZITHROMYCIN 250 MG PO TABS: ORAL_TABLET | ORAL | Status: DC

## 2015-01-28 MED ORDER — PREDNISONE 10 MG PO TABS: ORAL_TABLET | ORAL | Status: DC

## 2015-01-28 NOTE — Progress Notes (Signed)
Subjective:  Patient ID: Sandra Adams, female    DOB: [birth year]  Age: 49 y.o. MRN: [id number]  CC: Sinusitis and Urinary Tract Infection   HPI Kadence Mikkelson presents for recurrent sinus pain. The pain is located in the right cheek and radiates back to the right temporal and parietal region. It is moderate in severity and intermittent. She is also experiencing intermittent mild self-limited epistaxis. Patient also states that she is having some dysuria. Frequency of urination and odor as well.  History Shaliyah has a past medical history of Anxiety; Depression; Migraine; and Scoliosis.   She has past surgical history that includes Knee surgery.   Her family history includes COPD in her father, maternal aunt, and sister; Diabetes in her maternal aunt and maternal uncle; Epilepsy in her mother; Kidney disease in her sister. There is no history of Colon cancer, Colon polyps, Esophageal cancer, or Gallbladder disease.She reports that she has never smoked. She has never used smokeless tobacco. She reports that she does not drink alcohol or use illicit drugs.  Current Outpatient Prescriptions on File Prior to Visit  Medication Sig Dispense Refill  . albuterol (PROVENTIL HFA;VENTOLIN HFA) 108 (90 BASE) MCG/ACT inhaler Inhale 2 puffs into the lungs every 6 (six) hours as needed for wheezing or shortness of breath. With Spacer 1 Inhaler 1  . ALPRAZolam (XANAX) 0.25 MG tablet Take 1 tablet (0.25 mg total) by mouth at bedtime as needed for anxiety. 30 tablet 1  . cloNIDine (CATAPRES) 0.1 MG tablet One po qhs prn hot flashes 30 tablet 11  . estradiol (ESTRACE) 1 MG tablet Take 1 tablet (1 mg total) by mouth daily. 30 tablet 11  . hydrocortisone (PROCTOSOL HC) 2.5 % rectal cream Place 1 application rectally 2 (two) times daily. 30 g 0  . ibuprofen (ADVIL,MOTRIN) 800 MG tablet TAKE 1 TABLET (800 MG TOTAL) BY MOUTH 3 (THREE) TIMES DAILY. 60 tablet 1  . loratadine (CLARITIN) 10 MG tablet Take 1 tablet (10  mg total) by mouth daily. 30 tablet 11  . medroxyPROGESTERone (PROVERA) 2.5 MG tablet Take 1 tablet (2.5 mg total) by mouth daily. 30 tablet 11  . mometasone (NASONEX) 50 MCG/ACT nasal spray Place 2 sprays into the nose daily. 17 g 12  . omeprazole (PRILOSEC) 20 MG capsule Take 1 capsule (20 mg total) by mouth daily. 90 capsule 3  . pseudoephedrine-guaifenesin (MUCINEX D) 60-600 MG per tablet Take 1 tablet by mouth every 12 (twelve) hours. As needed for congestion and headache 20 tablet 0  . sertraline (ZOLOFT) 50 MG tablet Take 1 tablet (50 mg total) by mouth daily. 90 tablet 4  . sodium chloride (OCEAN) 0.65 % SOLN nasal spray Place 1 spray into both nostrils as needed for congestion (use sevearrl times daily as needed for nasal irritation). 1 Bottle prn   Current Facility-Administered Medications on File Prior to Visit  Medication Dose Route Frequency Provider Last Rate Last Dose  . levalbuterol (XOPENEX) nebulizer solution 1.25 mg  1.25 mg Nebulization Once Lysbeth Penner, FNP        ROS Review of Systems  Objective:  BP 109/63 mmHg  Pulse 92  Temp(Src) 97.1 F (36.2 C) (Oral)  Ht 5\' 1"  (1.549 m)  Wt 136 lb (61.689 kg)  BMI 25.71 kg/m2  BP Readings from Last 3 Encounters:  01/28/15 109/63  01/01/15 126/81  12/26/14 118/75    Wt Readings from Last 3 Encounters:  01/28/15 136 lb (61.689 kg)  01/01/15 130 lb 8 oz (59.194  kg)  12/26/14 131 lb 12.8 oz (59.784 kg)     Physical Exam  No results found for: HGBA1C  Lab Results  Component Value Date   GLUCOSE 72 10/01/2014   ALT 27 10/01/2014   AST 24 10/01/2014   NA 141 10/01/2014   K 4.3 10/01/2014   CL 100 10/01/2014   CREATININE 0.69 10/01/2014   BUN 9 10/01/2014   CO2 25 10/01/2014    Dg Esophagus  12/24/2014   CLINICAL DATA:  Dysphagia and gastroesophageal reflux intermittently for 10 years.  EXAM: ESOPHOGRAM/BARIUM SWALLOW  TECHNIQUE: Combined double contrast and single contrast examination performed using  effervescent crystals, thick barium liquid, and thin barium liquid.  FLUOROSCOPY TIME:  2 min, 1 second.  COMPARISON:  None.  FINDINGS: Swallowing function appeared normal without aspiration or penetration. The esophagus demonstrates normal caliber and mucosa throughout without stricture, mass or inflammatory change. Esophageal motility is unremarkable. The patient has a small hiatal hernia. No gastroesophageal reflux was elicited on this examination. A 13 mm barium tablet passed easily into the stomach.  IMPRESSION: Small hiatal hernia.  The examination is otherwise negative.   Electronically Signed   By: Inge Rise M.D.   On: 12/24/2014 14:05    Assessment & Plan:   Sakira was seen today for sinusitis and urinary tract infection.  Diagnoses and associated orders for this visit:  Urinary frequency - POCT urinalysis dipstick - POCT UA - Microscopic Only  Cystitis  Acute recurrent maxillary sinusitis - predniSONE (DELTASONE) 10 MG tablet; Take 5 daily for three days. Then 4, 3,2,1 for three days each - azithromycin (ZITHROMAX) 250 MG tablet; 2 today followed by one daily for 4 days - Ambulatory referral to ENT    I have discontinued Ms. Mccambridge's amoxicillin-clavulanate, azithromycin, and benzonatate. I am also having her start on predniSONE and azithromycin. Additionally, I am having her maintain her cloNIDine, estradiol, medroxyPROGESTERone, ALPRAZolam, sertraline, omeprazole, loratadine, ibuprofen, hydrocortisone, mometasone, sodium chloride, pseudoephedrine-guaifenesin, albuterol, and meloxicam. We will continue to administer levalbuterol.  Meds ordered this encounter  Medications  . meloxicam (MOBIC) 15 MG tablet    Sig: Take 1 tablet by mouth daily.    Refill:  0  . predniSONE (DELTASONE) 10 MG tablet    Sig: Take 5 daily for three days. Then 4, 3,2,1 for three days each    Dispense:  45 tablet    Refill:  0  . azithromycin (ZITHROMAX) 250 MG tablet    Sig: 2 today  followed by one daily for 4 days    Dispense:  6 each    Refill:  0    Follow-up: Return if symptoms worsen or fail to improve.  Claretta Fraise, M.D.

## 2015-01-29 ENCOUNTER — Telehealth: Payer: Self-pay | Admitting: Family Medicine

## 2015-01-30 NOTE — Telephone Encounter (Signed)
It doesn't look like the nursing department tried to call her. Possibly about a referral to ENT? Will forward to referrals.

## 2015-01-31 ENCOUNTER — Encounter: Payer: Self-pay | Admitting: Physician Assistant

## 2015-02-01 ENCOUNTER — Other Ambulatory Visit: Payer: Self-pay | Admitting: General Practice

## 2015-02-03 ENCOUNTER — Other Ambulatory Visit: Payer: Self-pay | Admitting: Family

## 2015-02-03 NOTE — Telephone Encounter (Signed)
Patient last seen in office on 01-28-15. Rx last filled on 10-01-14 for #30 with 1 RF. Please advise. If approved please route to Pool B so nurse can phone in to [facility]

## 2015-02-04 ENCOUNTER — Other Ambulatory Visit: Payer: 59

## 2015-02-04 DIAGNOSIS — R350 Frequency of micturition: Secondary | ICD-10-CM | POA: Diagnosis not present

## 2015-02-04 NOTE — Addendum Note (Signed)
Addended by: Pollyann Kennedy F on: 02/04/2015 10:41 AM   Modules accepted: Orders

## 2015-02-06 LAB — URINE CULTURE

## 2015-02-07 ENCOUNTER — Telehealth: Payer: Self-pay | Admitting: Family Medicine

## 2015-02-07 ENCOUNTER — Other Ambulatory Visit: Payer: Self-pay | Admitting: Family Medicine

## 2015-02-07 MED ORDER — NITROFURANTOIN MONOHYD MACRO 100 MG PO CAPS: 100.0000 mg | ORAL_CAPSULE | Freq: Two times a day (BID) | ORAL | Status: DC

## 2015-02-10 ENCOUNTER — Telehealth: Payer: Self-pay | Admitting: Family Medicine

## 2015-02-10 NOTE — Telephone Encounter (Signed)
Stp advised her urine cx did show UTI and antibiotics were sent over to the pharmacy. Pt voiced understanding.

## 2015-02-11 ENCOUNTER — Telehealth: Payer: Self-pay | Admitting: Family Medicine

## 2015-02-11 DIAGNOSIS — J329 Chronic sinusitis, unspecified: Secondary | ICD-10-CM

## 2015-02-11 NOTE — Telephone Encounter (Signed)
Patient aware and referral has been made 

## 2015-02-11 NOTE — Telephone Encounter (Signed)
DC the prednisone. Refer to ENT

## 2015-02-27 ENCOUNTER — Other Ambulatory Visit: Payer: Self-pay | Admitting: Family Medicine

## 2015-03-05 ENCOUNTER — Telehealth: Payer: Self-pay | Admitting: Family Medicine

## 2015-03-07 ENCOUNTER — Ambulatory Visit: Payer: 59 | Admitting: Family Medicine

## 2015-03-07 NOTE — Telephone Encounter (Signed)
Pt given appt with Christy 3/22 at 8:25. Pt only wanted to see Christy.

## 2015-03-11 ENCOUNTER — Encounter: Payer: Self-pay | Admitting: Family

## 2015-03-11 ENCOUNTER — Ambulatory Visit (INDEPENDENT_AMBULATORY_CARE_PROVIDER_SITE_OTHER): Payer: Medicare Other | Admitting: Family

## 2015-03-11 VITALS — BP 100/48 | HR 77 | Temp 97.8°F | Resp 16 | Ht 61.0 in | Wt 128.2 lb

## 2015-03-11 DIAGNOSIS — N898 Other specified noninflammatory disorders of vagina: Secondary | ICD-10-CM | POA: Diagnosis not present

## 2015-03-11 LAB — POCT UA - MICROSCOPIC ONLY
Casts, Ur, LPF, POC: NEGATIVE
Crystals, Ur, HPF, POC: NEGATIVE
EPITHELIAL CELLS, URINE PER MICROSCOPY: NEGATIVE
MUCUS UA: NEGATIVE
YEAST UA: NEGATIVE

## 2015-03-11 LAB — POCT WET PREP (WET MOUNT)

## 2015-03-11 LAB — POCT URINALYSIS DIPSTICK
BILIRUBIN UA: NEGATIVE
Glucose, UA: NEGATIVE
Ketones, UA: NEGATIVE
Leukocytes, UA: NEGATIVE
Nitrite, UA: NEGATIVE
PH UA: 6
Protein, UA: NEGATIVE
Spec Grav, UA: 1.015
Urobilinogen, UA: NEGATIVE

## 2015-03-11 LAB — POCT CBC
GRANULOCYTE PERCENT: 56.4 % (ref 37–80)
HCT, POC: 42 % (ref 37.7–47.9)
HEMOGLOBIN: 13.2 g/dL (ref 12.2–16.2)
LYMPH, POC: 3 (ref 0.6–3.4)
MCH, POC: 30.2 pg (ref 27–31.2)
MCHC: 31.4 g/dL — AB (ref 31.8–35.4)
MCV: 96.2 fL (ref 80–97)
MPV: 8.7 fL (ref 0–99.8)
POC Granulocyte: 4.4 (ref 2–6.9)
POC LYMPH PERCENT: 38.7 %L (ref 10–50)
Platelet Count, POC: 205 10*3/uL (ref 142–424)
RBC: 4.37 M/uL (ref 4.04–5.48)
RDW, POC: 13.3 %
WBC: 7.8 10*3/uL (ref 4.6–10.2)

## 2015-03-11 NOTE — Patient Instructions (Addendum)
Bacterial Vaginosis Bacterial vaginosis is a vaginal infection that occurs when the normal balance of bacteria in the vagina is disrupted. It results from an overgrowth of certain bacteria. This is the most common vaginal infection in women of childbearing age. Treatment is important to prevent complications, especially in pregnant women, as it can cause a premature delivery. CAUSES  Bacterial vaginosis is caused by an increase in harmful bacteria that are normally present in smaller amounts in the vagina. Several different kinds of bacteria can cause bacterial vaginosis. However, the reason that the condition develops is not fully understood. RISK FACTORS Certain activities or behaviors can put you at an increased risk of developing bacterial vaginosis, including:  Having a new sex partner or multiple sex partners.  Douching.  Using an intrauterine device (IUD) for contraception. Women do not get bacterial vaginosis from toilet seats, bedding, swimming pools, or contact with objects around them. SIGNS AND SYMPTOMS  Some women with bacterial vaginosis have no signs or symptoms. Common symptoms include:  Grey vaginal discharge.  A fishlike odor with discharge, especially after sexual intercourse.  Itching or burning of the vagina and vulva.  Burning or pain with urination. DIAGNOSIS  Your health care provider will take a medical history and examine the vagina for signs of bacterial vaginosis. A sample of vaginal fluid may be taken. Your health care provider will look at this sample under a microscope to check for bacteria and abnormal cells. A vaginal pH test may also be done.  TREATMENT  Bacterial vaginosis may be treated with antibiotic medicines. These may be given in the form of a pill or a vaginal cream. A second round of antibiotics may be prescribed if the condition comes back after treatment.  HOME CARE INSTRUCTIONS   Only take over-the-counter or prescription medicines as  directed by your health care provider.  If antibiotic medicine was prescribed, take it as directed. Make sure you finish it even if you start to feel better.  Do not have sex until treatment is completed.  Tell all sexual partners that you have a vaginal infection. They should see their health care provider and be treated if they have problems, such as a mild rash or itching.  Practice safe sex by using condoms and only having one sex partner. SEEK MEDICAL CARE IF:   Your symptoms are not improving after 3 days of treatment.  You have increased discharge or pain.  You have a fever. MAKE SURE YOU:   Understand these instructions.  Will watch your condition.  Will get help right away if you are not doing well or get worse. FOR MORE INFORMATION  Centers for Disease Control and Prevention, Division of STD Prevention: www.cdc.gov/std American Sexual Health Association (ASHA): www.ashastd.org  Document Released: 12/06/2005 Document Revised: 09/26/2013 Document Reviewed: 07/18/2013 ExitCare Patient Information 2015 [employer]. This information is not intended to replace advice given to you by your health care provider. Make sure you discuss any questions you have with your health care provider.  

## 2015-03-11 NOTE — Progress Notes (Signed)
   Subjective:    Patient ID: Sandra Adams, female    DOB: 12/27/1965, 48 y.o.   MRN: [id number]  Vaginal Discharge The patient's primary symptoms include a genital odor and vaginal discharge. This is a new problem. The current episode started 1 to 4 weeks ago. The problem occurs intermittently. The problem has been waxing and waning. Associated symptoms include chills, constipation, dysuria, flank pain, frequency, urgency and vomiting. Pertinent negatives include no diarrhea, headaches or hematuria. The vaginal discharge was white. She has tried antibiotics for the symptoms. The treatment provided mild relief. Her menstrual history has been irregular.  Dysuria  Associated symptoms include chills, flank pain, frequency, urgency and vomiting. Pertinent negatives include no hematuria.      Review of Systems  Constitutional: Positive for chills.  HENT: Negative.   Eyes: Negative.   Respiratory: Negative.  Negative for shortness of breath.   Cardiovascular: Negative.  Negative for palpitations.  Gastrointestinal: Positive for vomiting and constipation. Negative for diarrhea.  Endocrine: Negative.   Genitourinary: Positive for dysuria, urgency, frequency, flank pain and vaginal discharge. Negative for hematuria.  Musculoskeletal: Negative.   Neurological: Negative.  Negative for headaches.  Hematological: Negative.   Psychiatric/Behavioral: Negative.   All other systems reviewed and are negative.      Objective:   Physical Exam  Constitutional: She is oriented to person, place, and time. She appears well-developed and well-nourished. No distress.  HENT:  Head: Normocephalic and atraumatic.  Right Ear: External ear normal.  Left Ear: External ear normal.  Mouth/Throat: Oropharynx is clear and moist.  Eyes: Pupils are equal, round, and reactive to light.  Neck: Normal range of motion. Neck supple. No thyromegaly present.  Cardiovascular: Normal rate, regular rhythm, normal heart  sounds and intact distal pulses.   No murmur heard. Pulmonary/Chest: Effort normal and breath sounds normal. No respiratory distress. She has no wheezes.  Abdominal: Soft. Bowel sounds are normal. She exhibits no distension. There is no tenderness.  Genitourinary:  Bimanual exam- no adnexal masses or tenderness, ovaries nonpalpable   Cervix parous and pink- White thick discharge present in vaginal and on labia   Musculoskeletal: Normal range of motion. She exhibits no edema or tenderness.  Neurological: She is alert and oriented to person, place, and time. She has normal reflexes. No cranial nerve deficit.  Skin: Skin is warm and dry.  Psychiatric: She has a normal mood and affect. Her behavior is normal. Judgment and thought content normal.  Vitals reviewed.   BP 100/48 mmHg  Pulse 77  Temp(Src) 97.8 F (36.6 C) (Oral)  Resp 16  Ht 5' 1" (1.549 m)  Wt 128 lb 3.2 oz (58.151 kg)  BMI 24.24 kg/m2       Assessment & Plan:  1. Vaginal discharge -Safe sex discussed -Will wait for test results to come back before prescribing antibiotics Pt has been treated several times for UTI  and BV s/s never resolve per pt -RTO in 2 weeks for follow up - POCT Wet Prep ([place]) - POCT urinalysis dipstick - POCT UA - Microscopic Only - Urine culture - CMP14+EGFR - POCT CBC - STD Screening Panel/High Risk - Pap IG, CT/NG w/ reflex HPV when ASC-U   , FNP  

## 2015-03-11 NOTE — Addendum Note (Signed)
Addended by: Earlene Plater on: 03/11/2015 10:56 AM   Modules accepted: Miquel Dunn

## 2015-03-12 ENCOUNTER — Other Ambulatory Visit: Payer: Self-pay | Admitting: Family

## 2015-03-12 LAB — CMP14+EGFR
ALBUMIN: 4.2 g/dL (ref 3.5–5.5)
ALT: 27 IU/L (ref 0–32)
AST: 21 IU/L (ref 0–40)
Albumin/Globulin Ratio: 1.7 (ref 1.1–2.5)
Alkaline Phosphatase: 93 IU/L (ref 39–117)
BUN/Creatinine Ratio: 11 (ref 9–23)
BUN: 8 mg/dL (ref 6–24)
Bilirubin Total: 0.3 mg/dL (ref 0.0–1.2)
CALCIUM: 9.6 mg/dL (ref 8.7–10.2)
CO2: 28 mmol/L (ref 18–29)
Chloride: 103 mmol/L (ref 97–108)
Creatinine, Ser: 0.76 mg/dL (ref 0.57–1.00)
GFR calc Af Amer: 106 mL/min/{1.73_m2} (ref >59)
GFR calc non Af Amer: 92 mL/min/{1.73_m2} (ref >59)
Globulin, Total: 2.5 g/dL (ref 1.5–4.5)
Glucose: 93 mg/dL (ref 65–99)
Potassium: 4.6 mmol/L (ref 3.5–5.2)
Sodium: 142 mmol/L (ref 134–144)
Total Protein: 6.7 g/dL (ref 6.0–8.5)

## 2015-03-12 LAB — STD SCREENING PANEL/HIGH RISK
HEP B C IGM: NEGATIVE
HIV 1/HIV 2 AB: NONREACTIVE
HIV 1/O/2 Abs-Index Value: <1 (ref <1.00)
HSV 2 Glycoprotein G Ab, IgG: <0.91 index (ref 0.00–0.90)
Hep A IgM: NEGATIVE
Hepatitis B Surface Ag: NEGATIVE
RPR Ser Ql: NONREACTIVE

## 2015-03-12 MED ORDER — FLUCONAZOLE 150 MG PO TABS: 150.0000 mg | ORAL_TABLET | Freq: Once | ORAL | Status: DC

## 2015-03-13 LAB — PAP IG, CT-NG, RFX HPV ASCU
Chlamydia, Nuc. Acid Amp: NEGATIVE
GONOCOCCUS BY NUCLEIC ACID AMP: NEGATIVE
PAP Smear Comment: 0

## 2015-03-13 LAB — GC/CHLAMYDIA PROBE AMP
CHLAMYDIA, DNA PROBE: NEGATIVE
NEISSERIA GONORRHOEAE BY PCR: NEGATIVE

## 2015-03-16 LAB — URINE CULTURE

## 2015-03-18 ENCOUNTER — Other Ambulatory Visit: Payer: Self-pay | Admitting: Family

## 2015-03-18 MED ORDER — NITROFURANTOIN MONOHYD MACRO 100 MG PO CAPS: 100.0000 mg | ORAL_CAPSULE | Freq: Two times a day (BID) | ORAL | Status: DC

## 2015-03-28 ENCOUNTER — Ambulatory Visit: Payer: Medicare Other | Admitting: Family

## 2015-04-07 ENCOUNTER — Encounter: Payer: Self-pay | Admitting: Family

## 2015-04-07 ENCOUNTER — Ambulatory Visit (INDEPENDENT_AMBULATORY_CARE_PROVIDER_SITE_OTHER): Payer: Medicare Other | Admitting: Family

## 2015-04-07 VITALS — BP 110/73 | HR 79 | Temp 98.0°F | Ht 61.0 in | Wt 129.4 lb

## 2015-04-07 DIAGNOSIS — R350 Frequency of micturition: Secondary | ICD-10-CM | POA: Diagnosis not present

## 2015-04-07 DIAGNOSIS — R319 Hematuria, unspecified: Secondary | ICD-10-CM

## 2015-04-07 DIAGNOSIS — N390 Urinary tract infection, site not specified: Secondary | ICD-10-CM | POA: Diagnosis not present

## 2015-04-07 LAB — POCT URINALYSIS DIPSTICK
BILIRUBIN UA: NEGATIVE
Ketones, UA: NEGATIVE
NITRITE UA: NEGATIVE
PH UA: 6
Protein, UA: NEGATIVE
SPEC GRAV UA: 1.025
Urobilinogen, UA: NEGATIVE

## 2015-04-07 LAB — POCT UA - MICROSCOPIC ONLY
CASTS, UR, LPF, POC: NEGATIVE
Crystals, Ur, HPF, POC: NEGATIVE
MUCUS UA: NEGATIVE
RBC, URINE, MICROSCOPIC: 5.8
YEAST UA: NEGATIVE

## 2015-04-07 MED ORDER — SULFAMETHOXAZOLE-TRIMETHOPRIM 800-160 MG PO TABS: 1.0000 | ORAL_TABLET | Freq: Two times a day (BID) | ORAL | Status: DC

## 2015-04-07 NOTE — Addendum Note (Signed)
Addended by: [name] A on: 04/07/2015 06:55 PM   Modules accepted: Orders

## 2015-04-07 NOTE — Patient Instructions (Addendum)

## 2015-04-07 NOTE — Progress Notes (Signed)
   Subjective:    Patient ID: Sandra Adams, female    DOB: 1966/11/03, 49 y.o.   MRN: [id number]  HPI Pt presents to the office today to recheck urine. Pt was seen in the office on 03/11/15 and missed her follow up appointment. Pt was treated for a UTI and states it no longer burns when she urinates. However, pt states she some times feels like "there is a smell" at times, but if she "pees again right after the smell is not as strong". Pt denies any discharge, blood, or abd pain.    Review of Systems  Constitutional: Negative.   HENT: Negative.   Eyes: Negative.   Respiratory: Negative.  Negative for shortness of breath.   Cardiovascular: Negative.  Negative for palpitations.  Gastrointestinal: Negative.   Endocrine: Negative.   Genitourinary: Negative.   Musculoskeletal: Negative.   Neurological: Negative.  Negative for headaches.  Hematological: Negative.   Psychiatric/Behavioral: Negative.   All other systems reviewed and are negative.      Objective:   Physical Exam  Constitutional: She is oriented to person, place, and time. She appears well-developed and well-nourished. No distress.  HENT:  Head: Normocephalic and atraumatic.  Eyes: Pupils are equal, round, and reactive to light.  Neck: Normal range of motion. Neck supple. No thyromegaly present.  Cardiovascular: Normal rate, regular rhythm, normal heart sounds and intact distal pulses.   No murmur heard. Pulmonary/Chest: Effort normal and breath sounds normal. No respiratory distress. She has no wheezes.  Abdominal: Soft. Bowel sounds are normal. She exhibits no distension. There is no tenderness.  Musculoskeletal: Normal range of motion. She exhibits no edema or tenderness.  Negative for CVA tenderness   Neurological: She is alert and oriented to person, place, and time. She has normal reflexes. No cranial nerve deficit.  Skin: Skin is warm and dry.  Psychiatric: She has a normal mood and affect. Her behavior is  normal. Judgment and thought content normal.  Vitals reviewed.   BP 110/73 mmHg  Pulse 79  Temp(Src) 98 F (36.7 C) (Oral)  Ht 5\' 1"  (1.549 m)  Wt 129 lb 6.4 oz (58.695 kg)  BMI 24.46 kg/m2       Assessment & Plan:  1. Urinary frequency -Wipe from front to back -Force fluids -RTO prn - POCT UA - Microscopic Only - POCT urinalysis dipstick  2. Urinary tract infection with hematuria, site unspecified Force fluids AZO over the counter X2 days RTO prn Culture pending - sulfamethoxazole-trimethoprim (BACTRIM DS,SEPTRA DS) 800-160 MG per tablet; Take 1 tablet by mouth 2 (two) times daily.  Dispense: 20 tablet; Refill: 0  [name], FNP

## 2015-04-11 LAB — URINE CULTURE

## 2015-04-14 ENCOUNTER — Other Ambulatory Visit: Payer: Self-pay | Admitting: Family

## 2015-04-14 MED ORDER — NITROFURANTOIN MONOHYD MACRO 100 MG PO CAPS: 100.0000 mg | ORAL_CAPSULE | Freq: Two times a day (BID) | ORAL | Status: DC

## 2015-05-03 ENCOUNTER — Encounter (INDEPENDENT_AMBULATORY_CARE_PROVIDER_SITE_OTHER): Payer: Self-pay

## 2015-05-03 ENCOUNTER — Ambulatory Visit (INDEPENDENT_AMBULATORY_CARE_PROVIDER_SITE_OTHER): Payer: Medicare Other | Admitting: Physician Assistant

## 2015-05-03 VITALS — BP 111/70 | HR 77 | Temp 97.7°F | Ht 61.0 in | Wt 126.4 lb

## 2015-05-03 DIAGNOSIS — N760 Acute vaginitis: Secondary | ICD-10-CM | POA: Diagnosis not present

## 2015-05-03 DIAGNOSIS — N3090 Cystitis, unspecified without hematuria: Secondary | ICD-10-CM | POA: Diagnosis not present

## 2015-05-03 DIAGNOSIS — R399 Unspecified symptoms and signs involving the genitourinary system: Secondary | ICD-10-CM | POA: Diagnosis not present

## 2015-05-03 LAB — POCT UA - MICROSCOPIC ONLY
CRYSTALS, UR, HPF, POC: NEGATIVE
Casts, Ur, LPF, POC: NEGATIVE
Yeast, UA: NEGATIVE

## 2015-05-03 LAB — POCT URINALYSIS DIPSTICK
Bilirubin, UA: NEGATIVE
Glucose, UA: NEGATIVE
KETONES UA: NEGATIVE
LEUKOCYTES UA: NEGATIVE
Nitrite, UA: NEGATIVE
PROTEIN UA: NEGATIVE
Spec Grav, UA: 1.015
UROBILINOGEN UA: NEGATIVE
pH, UA: 5

## 2015-05-03 LAB — POCT WET PREP (WET MOUNT)
CLUE CELLS WET PREP WHIFF POC: NEGATIVE
Trichomonas Wet Prep HPF POC: NEGATIVE

## 2015-05-03 MED ORDER — NITROFURANTOIN MONOHYD MACRO 100 MG PO CAPS: 100.0000 mg | ORAL_CAPSULE | Freq: Two times a day (BID) | ORAL | Status: DC

## 2015-05-03 NOTE — Progress Notes (Signed)
   Subjective:    Patient ID: Sandra Adams, female    DOB: 1966/09/27, 49 y.o.   MRN: [id number]  HPI 49 y/o female presents with c/o urinary frequency , blood in urine. She was treated for UTI 2 weeks ago, took all of antibiotic, finished yesterday. Symptoms decreased while she was taking antibiotic but burning was worse today. She also noticed small amount of blood iin her urine today.     Review of Systems  Endocrine: Positive for polydipsia and polyuria.  Genitourinary: Positive for dysuria, urgency, frequency, hematuria, decreased urine volume (slow stream, "drips" instead of strong stream ), vaginal discharge (occasional ) and dyspareunia (burning , chronic ). Negative for vaginal bleeding and difficulty urinating.       Objective:   Physical Exam  Constitutional: She is oriented to person, place, and time. She appears well-developed and well-nourished. No distress.  Abdominal: Soft. She exhibits no distension. There is tenderness (suprapubic TTP). There is no rebound and no guarding.  Neurological: She is alert and oriented to person, place, and time.  Skin: She is not diaphoretic.  Psychiatric: She has a normal mood and affect. Her behavior is normal. Judgment and thought content normal.  Vitals reviewed.         Assessment & Plan:  1. UTI symptoms  - POCT urinalysis dipstick - POCT UA - Microscopic Only - nitrofurantoin, macrocrystal-monohydrate, (MACROBID) 100 MG capsule; Take 1 capsule (100 mg total) by mouth 2 (two) times daily.  Dispense: 10 capsule; Refill: 0 - Urine culture  2. Vaginitis and vulvovaginitis  - POCT Wet Prep [facility])  3. Cystitis  - nitrofurantoin, macrocrystal-monohydrate, (MACROBID) 100 MG capsule; Take 1 capsule (100 mg total) by mouth 2 (two) times daily.  Dispense: 10 capsule; Refill: 0    RTO 2 weeks for recheck and possible referral to Urology   Sabre Leonetti A. Benjamin Stain PA-C

## 2015-05-06 ENCOUNTER — Ambulatory Visit (INDEPENDENT_AMBULATORY_CARE_PROVIDER_SITE_OTHER): Payer: Medicare Other | Admitting: Physician Assistant

## 2015-05-06 ENCOUNTER — Ambulatory Visit: Payer: Medicare Other | Admitting: *Deleted

## 2015-05-06 ENCOUNTER — Encounter: Payer: Self-pay | Admitting: Physician Assistant

## 2015-05-06 VITALS — BP 116/74 | HR 82 | Temp 97.5°F | Ht 61.0 in | Wt 129.2 lb

## 2015-05-06 DIAGNOSIS — J020 Streptococcal pharyngitis: Secondary | ICD-10-CM

## 2015-05-06 DIAGNOSIS — J029 Acute pharyngitis, unspecified: Secondary | ICD-10-CM

## 2015-05-06 DIAGNOSIS — J309 Allergic rhinitis, unspecified: Secondary | ICD-10-CM | POA: Diagnosis not present

## 2015-05-06 DIAGNOSIS — F411 Generalized anxiety disorder: Secondary | ICD-10-CM

## 2015-05-06 LAB — POCT RAPID STREP A (OFFICE): Rapid Strep A Screen: NEGATIVE

## 2015-05-06 MED ORDER — ALPRAZOLAM 0.25 MG PO TABS: ORAL_TABLET | ORAL | Status: DC

## 2015-05-06 MED ORDER — FLUTICASONE PROPIONATE 50 MCG/ACT NA SUSP: 2.0000 | Freq: Every day | NASAL | Status: DC

## 2015-05-06 NOTE — Progress Notes (Signed)
   Subjective:    Patient ID: Sandra Adams, female    DOB: 14-Feb-1966, 49 y.o.   MRN: [id number]  HPI 49 y/o female presents with c/o burning in throat that started yesterday. Has tried gargling warm salt water with no relief. Worse with swallowing. Better with drinking tea and eating ice cream.     Review of Systems  Constitutional: Positive for fatigue.  HENT: Positive for congestion (nasal ), postnasal drip, rhinorrhea, sneezing and sore throat.   Respiratory: Positive for cough (nonproductive ).   All other systems reviewed and are negative.      Objective:   Physical Exam  Constitutional: She is oriented to person, place, and time. She appears well-developed and well-nourished.  HENT:  Head: Normocephalic and atraumatic.  Right Ear: External ear normal.  Left Ear: External ear normal.  Mouth/Throat: No oropharyngeal exudate.  Erythematous and injected posterior pharynx Negative for lymphadenopathy   Cardiovascular: Normal rate, regular rhythm, normal heart sounds and intact distal pulses.  Exam reveals no gallop and no friction rub.   No murmur heard. Pulmonary/Chest: Effort normal and breath sounds normal. No respiratory distress. She has no wheezes. She has no rales. She exhibits no tenderness.  Neurological: She is alert and oriented to person, place, and time.  Psychiatric: She has a normal mood and affect. Her behavior is normal. Judgment and thought content normal.  Nursing note and vitals reviewed.         Assessment & Plan:  1. Streptococcal sore throat  - POCT rapid strep A negative  - Culture, Group A Strep negative   2. Generalized anxiety disorder  - ALPRAZolam (XANAX) 0.25 MG tablet; TAKE 1 TABLET AT BEDTIME AS NEEDED FOR ANXIETY  Dispense: 30 tablet; Refill: 5  3. Allergic rhinitis, unspecified allergic rhinitis type - Continue Claritin 10mg  daily  - fluticasone (FLONASE) 50 MCG/ACT nasal spray; Place 2 sprays into both nostrils daily.  Dispense:  16 g; Refill: 6 - Gargle with warm salt water - cool mist humidifier      Levelle Edelen A. Benjamin Stain PA-C

## 2015-05-08 LAB — CULTURE, GROUP A STREP: Strep A Culture: NEGATIVE

## 2015-05-09 ENCOUNTER — Ambulatory Visit (INDEPENDENT_AMBULATORY_CARE_PROVIDER_SITE_OTHER): Payer: Medicare Other | Admitting: *Deleted

## 2015-05-09 ENCOUNTER — Encounter: Payer: Self-pay | Admitting: *Deleted

## 2015-05-09 VITALS — BP 109/68 | HR 71 | Resp 20 | Ht 60.5 in | Wt 126.4 lb

## 2015-05-09 DIAGNOSIS — Z0000 Encounter for general adult medical examination without abnormal findings: Secondary | ICD-10-CM | POA: Diagnosis not present

## 2015-05-09 NOTE — Progress Notes (Signed)
Subjective:   Sandra Adams is a 49 y.o. female who presents for an Initial Medicare Annual Wellness Visit. Sandra Adams is divorced and lives alone. She has been disabled since 2003 due to scoliosis and knee problems. She reports she does not drive and has to rely on her boyfriend and friends to take her to her appointments.  Review of System  Cardiac Risk Factors include: none     Objective:    Today's Vitals   05/09/15 1014  BP: 109/68  Pulse: 71  Resp: 20  Height: 5' 0.5" (1.537 m)  Weight: 126 lb 6.4 oz (57.335 kg)    Current Medications (verified) Outpatient Encounter Prescriptions as of 05/09/2015  Medication Sig  . ALPRAZolam (XANAX) 0.25 MG tablet TAKE 1 TABLET AT BEDTIME AS NEEDED FOR ANXIETY  . cetirizine (ZYRTEC) 10 MG tablet Take 1 tablet (10 mg total) by mouth daily.  . fluconazole (DIFLUCAN) 150 MG tablet Take 150 mg by mouth daily.  . fluticasone (FLONASE) 50 MCG/ACT nasal spray Place 2 sprays into both nostrils daily.  Marland Kitchen ibuprofen (ADVIL,MOTRIN) 800 MG tablet Take 800 mg by mouth every 6 (six) hours as needed.  . loratadine (CLARITIN) 10 MG tablet Take 1 tablet (10 mg total) by mouth daily.  . nitrofurantoin, macrocrystal-monohydrate, (MACROBID) 100 MG capsule Take 1 capsule (100 mg total) by mouth 2 (two) times daily.  Marland Kitchen omeprazole (PRILOSEC) 20 MG capsule Take 1 capsule (20 mg total) by mouth daily.  . sertraline (ZOLOFT) 50 MG tablet Take 1 tablet (50 mg total) by mouth daily.  . cloNIDine (CATAPRES) 0.1 MG tablet One po qhs prn hot flashes (Patient not taking: Reported on 05/09/2015)   No facility-administered encounter medications on file as of 05/09/2015.    Allergies (verified) Ciprofloxacin; Imitrex; Prednisone; Tylenol; and Vicodin   History: Past Medical History  Diagnosis Date  . Anxiety   . Depression   . Migraine   . Scoliosis   . Menopausal symptom    Past Surgical History  Procedure Laterality Date  . Knee surgery Left 1974 and 1979     Family History  Problem Relation Age of Onset  . Epilepsy Mother   . COPD Father   . Hyperlipidemia Father   . Heart disease Father   . COPD Sister   . COPD Maternal Aunt   . Colon cancer Neg Hx   . Colon polyps Neg Hx   . Esophageal cancer Neg Hx   . Gallbladder disease Neg Hx   . Diabetes Maternal Uncle   . Diabetes Maternal Aunt     x2  . Anxiety disorder Sister    Social History   Occupational History  . Diability     Back : Scoliois Knee surgery    Social History Main Topics  . Smoking status: Never Smoker   . Smokeless tobacco: Never Used  . Alcohol Use: No  . Drug Use: No  . Sexual Activity:    Partners: Male    Birth Control/ Protection: None    Tobacco Counseling NA  Activities of Daily Living In your present state of health, do you have any difficulty performing the following activities: 05/09/2015  Hearing? N  Vision? Y  Difficulty concentrating or making decisions? N  Walking or climbing stairs? Y  Dressing or bathing? N  Doing errands, shopping? Y  Preparing Food and eating ? N  Using the Toilet? N  In the past six months, have you accidently leaked urine? N  Do you  have problems with loss of bowel control? N  Managing your Medications? N  Managing your Finances? N  Housekeeping or managing your Housekeeping? N    Immunizations and Health Maintenance Immunization History  Administered Date(s) Administered  . Tdap 12/21/2007   Health Maintenance Due  Topic Date Due  . COLONOSCOPY  08/28/2014    Patient Care Team: Adella Nissen, PA-C as PCP - General (Physician Assistant)     Assessment:   This is a routine wellness examination for Sandra Adams.   Hearing/Vision screen No hearing deficits noted. Pt wears reading glasses last eye exam Sandra Adams 03/2015   Dietary issues and exercise activities discussed: Current Exercise Habits:: Home exercise routine, Type of exercise: Other - see comments (rides bike outside), Time (Minutes):  30, Frequency (Times/Week): 1, Weekly Exercise (Minutes/Week): 30, Intensity: Mild  Goals    None     Depression Screen PHQ 2/9 Scores 05/09/2015 03/14/2014 09/10/2013  PHQ - 2 Score 4 1 0  PHQ- 9 Score 8 - -    Fall Risk Fall Risk  05/09/2015 03/14/2014 09/10/2013  Falls in the past year? Yes No No  Number falls in past yr: 1 - -  Injury with Fall? (No Data) - -    Cognitive Function: MMSE - Mini Mental State Exam 05/09/2015  Not completed: Refused  Orientation to time 5  Orientation to Place 5  Registration 3  Attention/ Calculation 5  Recall 2  Language- name 2 objects 2  Language- repeat 1  Language- follow 3 step command 3  Language- read & follow direction 1  Write a sentence 1  Copy design 1  Total score 29    Screening Tests Health Maintenance  Topic Date Due  . COLONOSCOPY  08/28/2014  . INFLUENZA VACCINE  07/21/2015  . MAMMOGRAM  10/12/2015  . COLON CANCER SCREENING ANNUAL FOBT  01/18/2016  . TETANUS/TDAP  12/20/2017  . PAP SMEAR  03/10/2018  . HIV Screening  Completed      Plan:  Continue current medications as ordered Continue therapeutic lifestyle changes which include good diet and exercise Appt made for mammogram 05/16/15 @ 10 @ [facility] [address] discussion completed  Keep follow up appt with Marline Backbone on 05/20/15  During the course of the visit, Sandra Adams was educated and counseled about the following appropriate screening and preventive services:   Vaccines to include Pneumoccal, Influenza, Tdap , Zostavax pt wants to wait on the pneumoccal and shingles vaccine  Colorectal cancer screening pt refuses colonoscopy at this time. FOBT negative 01/17/15   Bone density screening pt is not post menopausal at this time will wait until next year to complete  Diabetes screening last glucose normal 03/11/15 93  Glaucoma screening last eye exam @ [facility] 03/2015 will call for results   Mammography appt made for 05/16/15 @ 10:30  PAP completed  03/09/15 and negative  Smoking cessation counseling NA   Patient Instructions (the written plan) were given to the patient.    Joneen Boers, RN   05/09/2015      I have reviewed and agree with the above AWV documentation.  Claretta Fraise, M.D.

## 2015-05-09 NOTE — Patient Instructions (Signed)
Preventive Care for Adults A healthy lifestyle and preventive care can promote health and wellness. Preventive health guidelines for women include the following key practices.  A routine yearly physical is a good way to check with your health care provider about your health and preventive screening. It is a chance to share any concerns and updates on your health and to receive a thorough exam.  Visit your dentist for a routine exam and preventive care every 6 months. Brush your teeth twice a day and floss once a day. Good oral hygiene prevents tooth decay and gum disease.  The frequency of eye exams is based on your age, health, family medical history, use of contact lenses, and other factors. Follow your health care provider's recommendations for frequency of eye exams.  Eat a healthy diet. Foods like vegetables, fruits, whole grains, low-fat dairy products, and lean protein foods contain the nutrients you need without too many calories. Decrease your intake of foods high in solid fats, added sugars, and salt. Eat the right amount of calories for you.Get information about a proper diet from your health care provider, if necessary.  Regular physical exercise is one of the most important things you can do for your health. Most adults should get at least 150 minutes of moderate-intensity exercise (any activity that increases your heart rate and causes you to sweat) each week. In addition, most adults need muscle-strengthening exercises on 2 or more days a week.  Maintain a healthy weight. The body mass index (BMI) is a screening tool to identify possible weight problems. It provides an estimate of body fat based on height and weight. Your health care provider can find your BMI and can help you achieve or maintain a healthy weight.For adults 20 years and older:  A BMI below 18.5 is considered underweight.  A BMI of 18.5 to 24.9 is normal.  A BMI of 25 to 29.9 is considered overweight.  A BMI of  30 and above is considered obese.  Maintain normal blood lipids and cholesterol levels by exercising and minimizing your intake of saturated fat. Eat a balanced diet with plenty of fruit and vegetables. Blood tests for lipids and cholesterol should begin at age 76 and be repeated every 5 years. If your lipid or cholesterol levels are high, you are over 50, or you are at high risk for heart disease, you may need your cholesterol levels checked more frequently.Ongoing high lipid and cholesterol levels should be treated with medicines if diet and exercise are not working.  If you smoke, find out from your health care provider how to quit. If you do not use tobacco, do not start.  Lung cancer screening is recommended for adults aged 22-80 years who are at high risk for developing lung cancer because of a history of smoking. A yearly low-dose CT scan of the lungs is recommended for people who have at least a 30-pack-year history of smoking and are a current smoker or have quit within the past 15 years. A pack year of smoking is smoking an average of 1 pack of cigarettes a day for 1 year (for example: 1 pack a day for 30 years or 2 packs a day for 15 years). Yearly screening should continue until the smoker has stopped smoking for at least 15 years. Yearly screening should be stopped for people who develop a health problem that would prevent them from having lung cancer treatment.  If you are pregnant, do not drink alcohol. If you are breastfeeding,  be very cautious about drinking alcohol. If you are not pregnant and choose to drink alcohol, do not have more than 1 drink per day. One drink is considered to be 12 ounces (355 mL) of beer, 5 ounces (148 mL) of wine, or 1.5 ounces (44 mL) of liquor.  Avoid use of street drugs. Do not share needles with anyone. Ask for help if you need support or instructions about stopping the use of drugs.  High blood pressure causes heart disease and increases the risk of  stroke. Your blood pressure should be checked at least every 1 to 2 years. Ongoing high blood pressure should be treated with medicines if weight loss and exercise do not work.  If you are 75-52 years old, ask your health care provider if you should take aspirin to prevent strokes.  Diabetes screening involves taking a blood sample to check your fasting blood sugar level. This should be done once every 3 years, after age 15, if you are within normal weight and without risk factors for diabetes. Testing should be considered at a younger age or be carried out more frequently if you are overweight and have at least 1 risk factor for diabetes.  Breast cancer screening is essential preventive care for women. You should practice "breast self-awareness." This means understanding the normal appearance and feel of your breasts and may include breast self-examination. Any changes detected, no matter how small, should be reported to a health care provider. Women in their 58s and 30s should have a clinical breast exam (CBE) by a health care provider as part of a regular health exam every 1 to 3 years. After age 16, women should have a CBE every year. Starting at age 53, women should consider having a mammogram (breast X-ray test) every year. Women who have a family history of breast cancer should talk to their health care provider about genetic screening. Women at a high risk of breast cancer should talk to their health care providers about having an MRI and a mammogram every year.  Breast cancer gene (BRCA)-related cancer risk assessment is recommended for women who have family members with BRCA-related cancers. BRCA-related cancers include breast, ovarian, tubal, and peritoneal cancers. Having family members with these cancers may be associated with an increased risk for harmful changes (mutations) in the breast cancer genes BRCA1 and BRCA2. Results of the assessment will determine the need for genetic counseling and  BRCA1 and BRCA2 testing.  Routine pelvic exams to screen for cancer are no longer recommended for nonpregnant women who are considered low risk for cancer of the pelvic organs (ovaries, uterus, and vagina) and who do not have symptoms. Ask your health care provider if a screening pelvic exam is right for you.  If you have had past treatment for cervical cancer or a condition that could lead to cancer, you need Pap tests and screening for cancer for at least 20 years after your treatment. If Pap tests have been discontinued, your risk factors (such as having a new sexual partner) need to be reassessed to determine if screening should be resumed. Some women have medical problems that increase the chance of getting cervical cancer. In these cases, your health care provider may recommend more frequent screening and Pap tests.  The HPV test is an additional test that may be used for cervical cancer screening. The HPV test looks for the virus that can cause the cell changes on the cervix. The cells collected during the Pap test can be  tested for HPV. The HPV test could be used to screen women aged 30 years and older, and should be used in women of any age who have unclear Pap test results. After the age of 30, women should have HPV testing at the same frequency as a Pap test.  Colorectal cancer can be detected and often prevented. Most routine colorectal cancer screening begins at the age of 50 years and continues through age 75 years. However, your health care provider may recommend screening at an earlier age if you have risk factors for colon cancer. On a yearly basis, your health care provider may provide home test kits to check for hidden blood in the stool. Use of a small camera at the end of a tube, to directly examine the colon (sigmoidoscopy or colonoscopy), can detect the earliest forms of colorectal cancer. Talk to your health care provider about this at age 50, when routine screening begins. Direct  exam of the colon should be repeated every 5-10 years through age 75 years, unless early forms of pre-cancerous polyps or small growths are found.  People who are at an increased risk for hepatitis B should be screened for this virus. You are considered at high risk for hepatitis B if:  You were born in a country where hepatitis B occurs often. Talk with your health care provider about which countries are considered high risk.  Your parents were born in a high-risk country and you have not received a shot to protect against hepatitis B (hepatitis B vaccine).  You have HIV or AIDS.  You use needles to inject street drugs.  You live with, or have sex with, someone who has hepatitis B.  You get hemodialysis treatment.  You take certain medicines for conditions like cancer, organ transplantation, and autoimmune conditions.  Hepatitis C blood testing is recommended for all people born from 1945 through 1965 and any individual with known risks for hepatitis C.  Practice safe sex. Use condoms and avoid high-risk sexual practices to reduce the spread of sexually transmitted infections (STIs). STIs include gonorrhea, chlamydia, syphilis, trichomonas, herpes, HPV, and human immunodeficiency virus (HIV). Herpes, HIV, and HPV are viral illnesses that have no cure. They can result in disability, cancer, and death.  You should be screened for sexually transmitted illnesses (STIs) including gonorrhea and chlamydia if:  You are sexually active and are younger than 24 years.  You are older than 24 years and your health care provider tells you that you are at risk for this type of infection.  Your sexual activity has changed since you were last screened and you are at an increased risk for chlamydia or gonorrhea. Ask your health care provider if you are at risk.  If you are at risk of being infected with HIV, it is recommended that you take a prescription medicine daily to prevent HIV infection. This is  called preexposure prophylaxis (PrEP). You are considered at risk if:  You are a heterosexual woman, are sexually active, and are at increased risk for HIV infection.  You take drugs by injection.  You are sexually active with a partner who has HIV.  Talk with your health care provider about whether you are at high risk of being infected with HIV. If you choose to begin PrEP, you should first be tested for HIV. You should then be tested every 3 months for as long as you are taking PrEP.  Osteoporosis is a disease in which the bones lose minerals and strength   with aging. This can result in serious bone fractures or breaks. The risk of osteoporosis can be identified using a bone density scan. Women ages 65 years and over and women at risk for fractures or osteoporosis should discuss screening with their health care providers. Ask your health care provider whether you should take a calcium supplement or vitamin D to reduce the rate of osteoporosis.  Menopause can be associated with physical symptoms and risks. Hormone replacement therapy is available to decrease symptoms and risks. You should talk to your health care provider about whether hormone replacement therapy is right for you.  Use sunscreen. Apply sunscreen liberally and repeatedly throughout the day. You should seek shade when your shadow is shorter than you. Protect yourself by wearing long sleeves, pants, a wide-brimmed hat, and sunglasses year round, whenever you are outdoors.  Once a month, do a whole body skin exam, using a mirror to look at the skin on your back. Tell your health care provider of new moles, moles that have irregular borders, moles that are larger than a pencil eraser, or moles that have changed in shape or color.  Stay current with required vaccines (immunizations).  Influenza vaccine. All adults should be immunized every year.  Tetanus, diphtheria, and acellular pertussis (Td, Tdap) vaccine. Pregnant women should  receive 1 dose of Tdap vaccine during each pregnancy. The dose should be obtained regardless of the length of time since the last dose. Immunization is preferred during the 27th-36th week of gestation. An adult who has not previously received Tdap or who does not know her vaccine status should receive 1 dose of Tdap. This initial dose should be followed by tetanus and diphtheria toxoids (Td) booster doses every 10 years. Adults with an unknown or incomplete history of completing a 3-dose immunization series with Td-containing vaccines should begin or complete a primary immunization series including a Tdap dose. Adults should receive a Td booster every 10 years.  Varicella vaccine. An adult without evidence of immunity to varicella should receive 2 doses or a second dose if she has previously received 1 dose. Pregnant females who do not have evidence of immunity should receive the first dose after pregnancy. This first dose should be obtained before leaving the health care facility. The second dose should be obtained 4-8 weeks after the first dose.  Human papillomavirus (HPV) vaccine. Females aged 13-26 years who have not received the vaccine previously should obtain the 3-dose series. The vaccine is not recommended for use in pregnant females. However, pregnancy testing is not needed before receiving a dose. If a female is found to be pregnant after receiving a dose, no treatment is needed. In that case, the remaining doses should be delayed until after the pregnancy. Immunization is recommended for any person with an immunocompromised condition through the age of 26 years if she did not get any or all doses earlier. During the 3-dose series, the second dose should be obtained 4-8 weeks after the first dose. The third dose should be obtained 24 weeks after the first dose and 16 weeks after the second dose.  Zoster vaccine. One dose is recommended for adults aged 60 years or older unless certain conditions are  present.  Measles, mumps, and rubella (MMR) vaccine. Adults born before 1957 generally are considered immune to measles and mumps. Adults born in 1957 or later should have 1 or more doses of MMR vaccine unless there is a contraindication to the vaccine or there is laboratory evidence of immunity to   each of the three diseases. A routine second dose of MMR vaccine should be obtained at least 28 days after the first dose for students attending postsecondary schools, health care workers, or international travelers. People who received inactivated measles vaccine or an unknown type of measles vaccine during 1963-1967 should receive 2 doses of MMR vaccine. People who received inactivated mumps vaccine or an unknown type of mumps vaccine before 1979 and are at high risk for mumps infection should consider immunization with 2 doses of MMR vaccine. For females of childbearing age, rubella immunity should be determined. If there is no evidence of immunity, females who are not pregnant should be vaccinated. If there is no evidence of immunity, females who are pregnant should delay immunization until after pregnancy. Unvaccinated health care workers born before 1957 who lack laboratory evidence of measles, mumps, or rubella immunity or laboratory confirmation of disease should consider measles and mumps immunization with 2 doses of MMR vaccine or rubella immunization with 1 dose of MMR vaccine.  Pneumococcal 13-valent conjugate (PCV13) vaccine. When indicated, a person who is uncertain of her immunization history and has no record of immunization should receive the PCV13 vaccine. An adult aged 19 years or older who has certain medical conditions and has not been previously immunized should receive 1 dose of PCV13 vaccine. This PCV13 should be followed with a dose of pneumococcal polysaccharide (PPSV23) vaccine. The PPSV23 vaccine dose should be obtained at least 8 weeks after the dose of PCV13 vaccine. An adult aged 19  years or older who has certain medical conditions and previously received 1 or more doses of PPSV23 vaccine should receive 1 dose of PCV13. The PCV13 vaccine dose should be obtained 1 or more years after the last PPSV23 vaccine dose.  Pneumococcal polysaccharide (PPSV23) vaccine. When PCV13 is also indicated, PCV13 should be obtained first. All adults aged 65 years and older should be immunized. An adult younger than age 65 years who has certain medical conditions should be immunized. Any person who resides in a nursing home or long-term care facility should be immunized. An adult smoker should be immunized. People with an immunocompromised condition and certain other conditions should receive both PCV13 and PPSV23 vaccines. People with human immunodeficiency virus (HIV) infection should be immunized as soon as possible after diagnosis. Immunization during chemotherapy or radiation therapy should be avoided. Routine use of PPSV23 vaccine is not recommended for American Indians, [place] Natives, or people younger than 65 years unless there are medical conditions that require PPSV23 vaccine. When indicated, people who have unknown immunization and have no record of immunization should receive PPSV23 vaccine. One-time revaccination 5 years after the first dose of PPSV23 is recommended for people aged 19-64 years who have chronic kidney failure, nephrotic syndrome, asplenia, or immunocompromised conditions. People who received 1-2 doses of PPSV23 before age 65 years should receive another dose of PPSV23 vaccine at age 65 years or later if at least 5 years have passed since the previous dose. Doses of PPSV23 are not needed for people immunized with PPSV23 at or after age 65 years.  Meningococcal vaccine. Adults with asplenia or persistent complement component deficiencies should receive 2 doses of quadrivalent meningococcal conjugate (MenACWY-D) vaccine. The doses should be obtained at least 2 months apart.  Microbiologists working with certain meningococcal bacteria, military recruits, people at risk during an outbreak, and people who travel to or live in countries with a high rate of meningitis should be immunized. A first-year college student up through age   21 years who is living in a residence hall should receive a dose if she did not receive a dose on or after her 16th birthday. Adults who have certain high-risk conditions should receive one or more doses of vaccine.  Hepatitis A vaccine. Adults who wish to be protected from this disease, have certain high-risk conditions, work with hepatitis A-infected animals, work in hepatitis A research labs, or travel to or work in countries with a high rate of hepatitis A should be immunized. Adults who were previously unvaccinated and who anticipate close contact with an international adoptee during the first 60 days after arrival in the [place] from a country with a high rate of hepatitis A should be immunized.  Hepatitis B vaccine. Adults who wish to be protected from this disease, have certain high-risk conditions, may be exposed to blood or other infectious body fluids, are household contacts or sex partners of hepatitis B positive people, are clients or workers in certain care facilities, or travel to or work in countries with a high rate of hepatitis B should be immunized.  Haemophilus influenzae type b (Hib) vaccine. A previously unvaccinated person with asplenia or sickle cell disease or having a scheduled splenectomy should receive 1 dose of Hib vaccine. Regardless of previous immunization, a recipient of a hematopoietic stem cell transplant should receive a 3-dose series 6-12 months after her successful transplant. Hib vaccine is not recommended for adults with HIV infection. Preventive Services / Frequency Ages 64 to 68 years  Blood pressure check.** / Every 1 to 2 years.  Lipid and cholesterol check.** / Every 5 years beginning at age  22.  Clinical breast exam.** / Every 3 years for women in their 88s and 53s.  BRCA-related cancer risk assessment.** / For women who have family members with a BRCA-related cancer (breast, ovarian, tubal, or peritoneal cancers).  Pap test.** / Every 2 years from ages 90 through 51. Every 3 years starting at age 21 through age 56 or 3 with a history of 3 consecutive normal Pap tests.  HPV screening.** / Every 3 years from ages 24 through ages 1 to 46 with a history of 3 consecutive normal Pap tests.  Hepatitis C blood test.** / For any individual with known risks for hepatitis C.  Skin self-exam. / Monthly.  Influenza vaccine. / Every year.  Tetanus, diphtheria, and acellular pertussis (Tdap, Td) vaccine.** / Consult your health care provider. Pregnant women should receive 1 dose of Tdap vaccine during each pregnancy. 1 dose of Td every 10 years.  Varicella vaccine.** / Consult your health care provider. Pregnant females who do not have evidence of immunity should receive the first dose after pregnancy.  HPV vaccine. / 3 doses over 6 months, if 72 and younger. The vaccine is not recommended for use in pregnant females. However, pregnancy testing is not needed before receiving a dose.  Measles, mumps, rubella (MMR) vaccine.** / You need at least 1 dose of MMR if you were born in 1957 or later. You may also need a 2nd dose. For females of childbearing age, rubella immunity should be determined. If there is no evidence of immunity, females who are not pregnant should be vaccinated. If there is no evidence of immunity, females who are pregnant should delay immunization until after pregnancy.  Pneumococcal 13-valent conjugate (PCV13) vaccine.** / Consult your health care provider.  Pneumococcal polysaccharide (PPSV23) vaccine.** / 1 to 2 doses if you smoke cigarettes or if you have certain conditions.  Meningococcal vaccine.** /  1 dose if you are age 19 to 21 years and a first-year college  student living in a residence hall, or have one of several medical conditions, you need to get vaccinated against meningococcal disease. You may also need additional booster doses.  Hepatitis A vaccine.** / Consult your health care provider.  Hepatitis B vaccine.** / Consult your health care provider.  Haemophilus influenzae type b (Hib) vaccine.** / Consult your health care provider. Ages 40 to 64 years  Blood pressure check.** / Every 1 to 2 years.  Lipid and cholesterol check.** / Every 5 years beginning at age 20 years.  Lung cancer screening. / Every year if you are aged 55-80 years and have a 30-pack-year history of smoking and currently smoke or have quit within the past 15 years. Yearly screening is stopped once you have quit smoking for at least 15 years or develop a health problem that would prevent you from having lung cancer treatment.  Clinical breast exam.** / Every year after age 40 years.  BRCA-related cancer risk assessment.** / For women who have family members with a BRCA-related cancer (breast, ovarian, tubal, or peritoneal cancers).  Mammogram.** / Every year beginning at age 40 years and continuing for as long as you are in good health. Consult with your health care provider.  Pap test.** / Every 3 years starting at age 30 years through age 65 or 70 years with a history of 3 consecutive normal Pap tests.  HPV screening.** / Every 3 years from ages 30 years through ages 65 to 70 years with a history of 3 consecutive normal Pap tests.  Fecal occult blood test (FOBT) of stool. / Every year beginning at age 50 years and continuing until age 75 years. You may not need to do this test if you get a colonoscopy every 10 years.  Flexible sigmoidoscopy or colonoscopy.** / Every 5 years for a flexible sigmoidoscopy or every 10 years for a colonoscopy beginning at age 50 years and continuing until age 75 years.  Hepatitis C blood test.** / For all people born from 1945 through  1965 and any individual with known risks for hepatitis C.  Skin self-exam. / Monthly.  Influenza vaccine. / Every year.  Tetanus, diphtheria, and acellular pertussis (Tdap/Td) vaccine.** / Consult your health care provider. Pregnant women should receive 1 dose of Tdap vaccine during each pregnancy. 1 dose of Td every 10 years.  Varicella vaccine.** / Consult your health care provider. Pregnant females who do not have evidence of immunity should receive the first dose after pregnancy.  Zoster vaccine.** / 1 dose for adults aged 60 years or older.  Measles, mumps, rubella (MMR) vaccine.** / You need at least 1 dose of MMR if you were born in 1957 or later. You may also need a 2nd dose. For females of childbearing age, rubella immunity should be determined. If there is no evidence of immunity, females who are not pregnant should be vaccinated. If there is no evidence of immunity, females who are pregnant should delay immunization until after pregnancy.  Pneumococcal 13-valent conjugate (PCV13) vaccine.** / Consult your health care provider.  Pneumococcal polysaccharide (PPSV23) vaccine.** / 1 to 2 doses if you smoke cigarettes or if you have certain conditions.  Meningococcal vaccine.** / Consult your health care provider.  Hepatitis A vaccine.** / Consult your health care provider.  Hepatitis B vaccine.** / Consult your health care provider.  Haemophilus influenzae type b (Hib) vaccine.** / Consult your health care provider. Ages 65   years and over  Blood pressure check.** / Every 1 to 2 years.  Lipid and cholesterol check.** / Every 5 years beginning at age 22 years.  Lung cancer screening. / Every year if you are aged 73-80 years and have a 30-pack-year history of smoking and currently smoke or have quit within the past 15 years. Yearly screening is stopped once you have quit smoking for at least 15 years or develop a health problem that would prevent you from having lung cancer  treatment.  Clinical breast exam.** / Every year after age 4 years.  BRCA-related cancer risk assessment.** / For women who have family members with a BRCA-related cancer (breast, ovarian, tubal, or peritoneal cancers).  Mammogram.** / Every year beginning at age 40 years and continuing for as long as you are in good health. Consult with your health care provider.  Pap test.** / Every 3 years starting at age 9 years through age 34 or 91 years with 3 consecutive normal Pap tests. Testing can be stopped between 65 and 70 years with 3 consecutive normal Pap tests and no abnormal Pap or HPV tests in the past 10 years.  HPV screening.** / Every 3 years from ages 57 years through ages 64 or 45 years with a history of 3 consecutive normal Pap tests. Testing can be stopped between 65 and 70 years with 3 consecutive normal Pap tests and no abnormal Pap or HPV tests in the past 10 years.  Fecal occult blood test (FOBT) of stool. / Every year beginning at age 15 years and continuing until age 17 years. You may not need to do this test if you get a colonoscopy every 10 years.  Flexible sigmoidoscopy or colonoscopy.** / Every 5 years for a flexible sigmoidoscopy or every 10 years for a colonoscopy beginning at age 86 years and continuing until age 71 years.  Hepatitis C blood test.** / For all people born from [birth year] through 1967 and any individual with known risks for hepatitis C.  Osteoporosis screening.** / A one-time screening for women ages 83 years and over and women at risk for fractures or osteoporosis.  Skin self-exam. / Monthly.  Influenza vaccine. / Every year.  Tetanus, diphtheria, and acellular pertussis (Tdap/Td) vaccine.** / 1 dose of Td every 10 years.  Varicella vaccine.** / Consult your health care provider.  Zoster vaccine.** / 1 dose for adults aged 61 years or older.  Pneumococcal 13-valent conjugate (PCV13) vaccine.** / Consult your health care provider.  Pneumococcal  polysaccharide (PPSV23) vaccine.** / 1 dose for all adults aged 28 years and older.  Meningococcal vaccine.** / Consult your health care provider.  Hepatitis A vaccine.** / Consult your health care provider.  Hepatitis B vaccine.** / Consult your health care provider.  Haemophilus influenzae type b (Hib) vaccine.** / Consult your health care provider. ** Family history and personal history of risk and conditions may change your health care provider's recommendations. Document Released: 02/01/2002 Document Revised: 04/22/2014 Document Reviewed: 05/03/2011 [facility] Patient Information 2015 [place], [place]. This information is not intended to replace advice given to you by your health care provider. Make sure you discuss any questions you have with your health care provider.

## 2015-05-14 ENCOUNTER — Telehealth: Payer: Self-pay | Admitting: Physician Assistant

## 2015-05-15 ENCOUNTER — Other Ambulatory Visit: Payer: Self-pay | Admitting: Physician Assistant

## 2015-05-15 DIAGNOSIS — J309 Allergic rhinitis, unspecified: Secondary | ICD-10-CM

## 2015-05-15 DIAGNOSIS — Z1231 Encounter for screening mammogram for malignant neoplasm of breast: Secondary | ICD-10-CM | POA: Diagnosis not present

## 2015-05-15 LAB — HM MAMMOGRAPHY

## 2015-05-15 MED ORDER — MONTELUKAST SODIUM 10 MG PO TABS: 10.0000 mg | ORAL_TABLET | Freq: Every day | ORAL | Status: DC

## 2015-05-15 NOTE — Telephone Encounter (Signed)
Prescription sent to pharmacy  Make sure she is using Flonase daily also

## 2015-05-20 ENCOUNTER — Ambulatory Visit (INDEPENDENT_AMBULATORY_CARE_PROVIDER_SITE_OTHER): Payer: Medicare Other | Admitting: Physician Assistant

## 2015-05-20 ENCOUNTER — Encounter: Payer: Self-pay | Admitting: Physician Assistant

## 2015-05-20 VITALS — BP 103/61 | HR 72 | Temp 97.4°F | Ht 60.5 in | Wt 128.0 lb

## 2015-05-20 DIAGNOSIS — B373 Candidiasis of vulva and vagina: Secondary | ICD-10-CM

## 2015-05-20 DIAGNOSIS — Z87440 Personal history of urinary (tract) infections: Secondary | ICD-10-CM | POA: Diagnosis not present

## 2015-05-20 DIAGNOSIS — B3731 Acute candidiasis of vulva and vagina: Secondary | ICD-10-CM

## 2015-05-20 LAB — POCT UA - MICROSCOPIC ONLY
Bacteria, U Microscopic: NEGATIVE
Casts, Ur, LPF, POC: NEGATIVE
Crystals, Ur, HPF, POC: NEGATIVE
RBC, URINE, MICROSCOPIC: NEGATIVE
Yeast, UA: NEGATIVE

## 2015-05-20 LAB — POCT URINALYSIS DIPSTICK
Bilirubin, UA: NEGATIVE
Glucose, UA: NEGATIVE
KETONES UA: NEGATIVE
LEUKOCYTES UA: NEGATIVE
Nitrite, UA: NEGATIVE
PH UA: 6
PROTEIN UA: NEGATIVE
Spec Grav, UA: 1.025
Urobilinogen, UA: NEGATIVE

## 2015-05-20 MED ORDER — FLUCONAZOLE 150 MG PO TABS: ORAL_TABLET | ORAL | Status: DC

## 2015-05-20 NOTE — Progress Notes (Signed)
   Subjective:    Patient ID: Sandra Adams, female    DOB: Sep 02, 1966, 49 y.o.   MRN: [id number]  HPI 49 y/o female presents for f/u of UTI . She has finished Macrobid. She states that she is not having any more symptoms of dysuria, however, she states that she is having foul odor whitish discharge and thinks she has a yeast infection.     Review of Systems  Genitourinary: Positive for vaginal discharge (whitish foul odor ).  All other systems reviewed and are negative.      Objective:   Physical Exam  Constitutional: She is oriented to person, place, and time. She appears well-developed and well-nourished. No distress.  HENT:  Head: Normocephalic and atraumatic.  Pulmonary/Chest: Effort normal and breath sounds normal. No respiratory distress.  Neurological: She is alert and oriented to person, place, and time.  Skin: She is not diaphoretic.  Psychiatric: She has a normal mood and affect. Her behavior is normal. Judgment and thought content normal.  Nursing note and vitals reviewed.         Assessment & Plan:  1. History of recurrent UTI (urinary tract infection)  - POCT urinalysis dipstick - POCT UA - Microscopic Only  If patient has additional UTIs, will consider referral to Urologist  2. Vulvovaginal candidiasis  - fluconazole (DIFLUCAN) 150 MG tablet; Take 1 tablet on day 1. Repeat in 3 days.  Dispense: 2 tablet; Refill: 0  RTO prn   Adalina Dopson A. Benjamin Stain PA-C

## 2015-05-27 ENCOUNTER — Telehealth: Payer: Self-pay | Admitting: Physician Assistant

## 2015-05-28 NOTE — Telephone Encounter (Signed)
Please review and advise.

## 2015-05-30 NOTE — Telephone Encounter (Signed)
Was this supposed to go to me? Are you asking if its ok to wait? Unsure of why this is in my inbox. Tudor Chandley A. Benjamin Stain PA-C

## 2015-06-10 ENCOUNTER — Other Ambulatory Visit: Payer: Self-pay | Admitting: Family Medicine

## 2015-06-11 ENCOUNTER — Other Ambulatory Visit: Payer: Self-pay

## 2015-06-12 ENCOUNTER — Encounter: Payer: Self-pay | Admitting: Physician Assistant

## 2015-06-12 ENCOUNTER — Ambulatory Visit (INDEPENDENT_AMBULATORY_CARE_PROVIDER_SITE_OTHER): Payer: Medicare Other | Admitting: Physician Assistant

## 2015-06-12 VITALS — BP 110/62 | HR 76 | Temp 97.5°F | Ht 60.5 in | Wt 132.0 lb

## 2015-06-12 DIAGNOSIS — M545 Low back pain: Secondary | ICD-10-CM | POA: Diagnosis not present

## 2015-06-12 DIAGNOSIS — N3090 Cystitis, unspecified without hematuria: Secondary | ICD-10-CM | POA: Diagnosis not present

## 2015-06-12 DIAGNOSIS — R319 Hematuria, unspecified: Secondary | ICD-10-CM

## 2015-06-12 LAB — POCT URINALYSIS DIPSTICK
Bilirubin, UA: NEGATIVE
Glucose, UA: NEGATIVE
Ketones, UA: NEGATIVE
Nitrite, UA: NEGATIVE
PROTEIN UA: NEGATIVE
Spec Grav, UA: >=1.03
Urobilinogen, UA: NEGATIVE
pH, UA: 6

## 2015-06-12 LAB — POCT UA - MICROSCOPIC ONLY
CRYSTALS, UR, HPF, POC: NEGATIVE
Casts, Ur, LPF, POC: NEGATIVE
Yeast, UA: NEGATIVE

## 2015-06-12 MED ORDER — NITROFURANTOIN MONOHYD MACRO 100 MG PO CAPS: 100.0000 mg | ORAL_CAPSULE | Freq: Two times a day (BID) | ORAL | Status: DC

## 2015-06-12 NOTE — Patient Instructions (Signed)
Call obgyn to schedule follow up appointment for evaluation of fibroids Will send referral to urology

## 2015-06-12 NOTE — Progress Notes (Signed)
   Subjective:    Patient ID: Sandra Adams, female    DOB: [birth year], 49 y.o.   MRN: [id number]  HPI 49 y/o female presents with c/o mid, lower back pain x 2 weeks. Worse in the morning. She is sleeping on her couch. She has tried ibuprofen 800mg  prn which has not helped the pain. She can't take muscle relaxers because they cause her to vomit. Worse with movement, better with ibuprofen sometimes.     Review of Systems  Gastrointestinal: Negative.   Genitourinary: Positive for difficulty urinating. Negative for dysuria and hematuria.  Musculoskeletal: Positive for back pain (mid to lower back pain, bilateral ).  Neurological: Positive for weakness (all over). Negative for numbness.       Objective:   Physical Exam  Constitutional: She is oriented to person, place, and time. She appears well-developed and well-nourished.  Cardiovascular: Normal rate and regular rhythm.   Abdominal: Soft. She exhibits no distension and no mass. There is no tenderness. There is no rebound and no guarding.  Genitourinary:  Negative CVA ttp   Musculoskeletal: She exhibits tenderness (bilateral paraspinal muscles ). She exhibits no edema.  Neurological: She is alert and oriented to person, place, and time.  Psychiatric: She has a normal mood and affect. Her behavior is normal. Judgment and thought content normal.  Nursing note and vitals reviewed.  Results for orders placed or performed in visit on 06/12/15  POCT urinalysis dipstick  Result Value Ref Range   Color, UA gold    Clarity, UA clear    Glucose, UA negative    Bilirubin, UA negative    Ketones, UA negative    Spec Grav, UA >=1.030    Blood, UA large    pH, UA 6.0    Protein, UA negative    Urobilinogen, UA negative    Nitrite, UA negative    Leukocytes, UA small (1+) (A) Negative  POCT UA - Microscopic Only  Result Value Ref Range   WBC, Ur, HPF, POC 10-20    RBC, urine, microscopic 20-30    Bacteria, U Microscopic few    Mucus, UA  few    Epithelial cells, urine per micros few    Crystals, Ur, HPF, POC negative    Casts, Ur, LPF, POC negative    Yeast, UA negative           Assessment & Plan:  1. Left low back pain, with sciatica presence unspecified - Aleve 1 PO BID as directed - Advised patient to stop sleeping on couch and find a comfortable mattress. Due to her c/o increased pain / stiffness in the morning, I feel that her back pain stems from muscular etiology and sleeping habits.  - POCT urinalysis dipstick - POCT UA - Microscopic Only  2. Hematuria - Referral to Urology  - Urine culture  3. Cystitis - Referral to Urology due to recurrent cystitis and hematuria - Urine culture - nitrofurantoin, macrocrystal-monohydrate, (MACROBID) 100 MG capsule; Take 1 capsule (100 mg total) by mouth 2 (two) times daily.  Dispense: 20 capsule; Refill: 0   Continue all meds Labs pending Health Maintenance reviewed Diet and exercise encouraged RTO 2 weeks   Candler Ginsberg A. Benjamin Stain PA-C

## 2015-06-13 LAB — URINE CULTURE

## 2015-07-08 ENCOUNTER — Telehealth: Payer: Self-pay | Admitting: Family

## 2015-07-08 ENCOUNTER — Ambulatory Visit: Payer: Medicare Other | Admitting: Nurse Practitioner

## 2015-07-09 DIAGNOSIS — N6001 Solitary cyst of right breast: Secondary | ICD-10-CM | POA: Diagnosis not present

## 2015-07-10 NOTE — Telephone Encounter (Signed)
Tiffany, can you address. i think she is seen frequently for these issues

## 2015-07-11 ENCOUNTER — Ambulatory Visit: Payer: Medicare Other | Admitting: Physician Assistant

## 2015-07-11 ENCOUNTER — Other Ambulatory Visit: Payer: Self-pay | Admitting: Physician Assistant

## 2015-07-11 DIAGNOSIS — N3090 Cystitis, unspecified without hematuria: Secondary | ICD-10-CM

## 2015-07-11 MED ORDER — FLUCONAZOLE 150 MG PO TABS: 150.0000 mg | ORAL_TABLET | Freq: Once | ORAL | Status: DC

## 2015-07-11 MED ORDER — NITROFURANTOIN MONOHYD MACRO 100 MG PO CAPS: 100.0000 mg | ORAL_CAPSULE | Freq: Two times a day (BID) | ORAL | Status: DC

## 2015-07-11 NOTE — Telephone Encounter (Signed)
She has an appt on 08/19/15 with [facility]. Tell her to call to see if they can see her any sooner. I will send over rx for macrobid   Arwa Yero A. Benjamin Stain PA-C

## 2015-07-11 NOTE — Telephone Encounter (Signed)
Sandra Adams ordered antibiotic for patient's bladder problem. Nurse will call to let her know to pick up script at pharmacy.

## 2015-07-28 ENCOUNTER — Encounter: Payer: Self-pay | Admitting: Family Medicine

## 2015-08-04 ENCOUNTER — Encounter: Payer: Self-pay | Admitting: Family

## 2015-08-04 ENCOUNTER — Ambulatory Visit (INDEPENDENT_AMBULATORY_CARE_PROVIDER_SITE_OTHER): Payer: Medicare Other | Admitting: Family

## 2015-08-04 VITALS — BP 123/78 | HR 86 | Temp 97.6°F | Ht 60.0 in | Wt 131.4 lb

## 2015-08-04 DIAGNOSIS — R300 Dysuria: Secondary | ICD-10-CM

## 2015-08-04 DIAGNOSIS — B373 Candidiasis of vulva and vagina: Secondary | ICD-10-CM

## 2015-08-04 DIAGNOSIS — B3731 Acute candidiasis of vulva and vagina: Secondary | ICD-10-CM

## 2015-08-04 DIAGNOSIS — Z780 Asymptomatic menopausal state: Secondary | ICD-10-CM | POA: Diagnosis not present

## 2015-08-04 LAB — POCT UA - MICROSCOPIC ONLY
BACTERIA, U MICROSCOPIC: NEGATIVE
Casts, Ur, LPF, POC: NEGATIVE
Crystals, Ur, HPF, POC: NEGATIVE
EPITHELIAL CELLS, URINE PER MICROSCOPY: NEGATIVE
WBC, Ur, HPF, POC: NEGATIVE
Yeast, UA: NEGATIVE

## 2015-08-04 LAB — POCT URINALYSIS DIPSTICK
BILIRUBIN UA: NEGATIVE
GLUCOSE UA: NEGATIVE
Ketones, UA: NEGATIVE
LEUKOCYTES UA: NEGATIVE
NITRITE UA: NEGATIVE
Protein, UA: NEGATIVE
Spec Grav, UA: 1.015
Urobilinogen, UA: NEGATIVE
pH, UA: 6.5

## 2015-08-04 LAB — POCT WET PREP WITH KOH
CLUE CELLS WET PREP PER HPF POC: NEGATIVE
KOH PREP POC: POSITIVE
TRICHOMONAS UA: NEGATIVE

## 2015-08-04 MED ORDER — FLUCONAZOLE 150 MG PO TABS: 150.0000 mg | ORAL_TABLET | Freq: Once | ORAL | Status: DC

## 2015-08-04 MED ORDER — DULOXETINE HCL 30 MG PO CPEP: 30.0000 mg | ORAL_CAPSULE | Freq: Every day | ORAL | Status: DC

## 2015-08-04 NOTE — Progress Notes (Signed)
Subjective:    Patient ID: Sandra Adams, female    DOB: 1966/07/18, 49 y.o.   MRN: [id number]  Pt presents to the office today for right ear pain and a vaginal foul smelling discharge. PT is also complaining of symptoms of menopause. Pt states over the last 5 months she is having hot flashes and "just does not feel like herself".  Otalgia  There is pain in the right ear. This is a new problem. The current episode started more than 1 month ago. The problem has been waxing and waning. The pain is at a severity of 2/10. The pain is moderate. Pertinent negatives include no coughing, diarrhea, ear discharge, headaches, rhinorrhea or vomiting. She has tried acetaminophen for the symptoms. The treatment provided mild relief.  Vaginal Discharge The patient's primary symptoms include a genital odor and vaginal discharge. This is a new problem. The current episode started 1 to 4 weeks ago. The problem occurs intermittently. The problem has been waxing and waning. Associated symptoms include discolored urine, flank pain, frequency and urgency. Pertinent negatives include no diarrhea, dysuria, fever, headaches or vomiting. She has tried nothing for the symptoms. The treatment provided no relief.      Review of Systems  Constitutional: Negative.  Negative for fever.  HENT: Positive for ear pain. Negative for ear discharge and rhinorrhea.   Eyes: Negative.   Respiratory: Negative.  Negative for cough and shortness of breath.   Cardiovascular: Negative.  Negative for palpitations.  Gastrointestinal: Negative.  Negative for vomiting and diarrhea.  Endocrine: Negative.   Genitourinary: Positive for urgency, frequency, flank pain and vaginal discharge. Negative for dysuria.  Musculoskeletal: Negative.   Neurological: Negative.  Negative for headaches.  Hematological: Negative.   Psychiatric/Behavioral: Negative.   All other systems reviewed and are negative.      Objective:   Physical Exam    Constitutional: She is oriented to person, place, and time. She appears well-developed and well-nourished. No distress.  HENT:  Head: Normocephalic and atraumatic.  Right Ear: External ear normal.  Left Ear: External ear normal.  Nose: Nose normal.  Mouth/Throat: Oropharynx is clear and moist.  Eyes: Pupils are equal, round, and reactive to light.  Neck: Normal range of motion. Neck supple. No thyromegaly present.  Cardiovascular: Normal rate, regular rhythm, normal heart sounds and intact distal pulses.   No murmur heard. Pulmonary/Chest: Effort normal and breath sounds normal. No respiratory distress. She has no wheezes.  Abdominal: Soft. Bowel sounds are normal. She exhibits no distension. There is no tenderness.  Musculoskeletal: Normal range of motion. She exhibits no edema or tenderness.  Neurological: She is alert and oriented to person, place, and time. She has normal reflexes. No cranial nerve deficit.  Skin: Skin is warm and dry.  Psychiatric: She has a normal mood and affect. Her behavior is normal. Judgment and thought content normal.  Vitals reviewed.   BP 123/78 mmHg  Pulse 86  Temp(Src) 97.6 F (36.4 C) (Oral)  Ht 5' (1.524 m)  Wt 131 lb 6.4 oz (59.603 kg)  BMI 25.66 kg/m2       Assessment & Plan:  1. Dysuria - POCT urinalysis dipstick - POCT UA - Microscopic Only - POCT Wet Prep with KOH  2. Vaginal yeast infection -Keep clean and dry -Cotton underwear - fluconazole (DIFLUCAN) 150 MG tablet; Take 1 tablet (150 mg total) by mouth once.  Dispense: 1 tablet; Refill: 0  3. Post-menopausal OTC meds discussed - DULoxetine (CYMBALTA) 30 MG capsule;  Take 1 capsule (30 mg total) by mouth daily.  Dispense: 90 capsule; Refill: 3   Continue all meds Labs pending Health Maintenance reviewed Diet and exercise encouraged RTO as needed  [name], FNP

## 2015-08-04 NOTE — Patient Instructions (Addendum)
Monilial Vaginitis Vaginitis in a soreness, swelling and redness (inflammation) of the vagina and vulva. Monilial vaginitis is not a sexually transmitted infection. CAUSES  Yeast vaginitis is caused by yeast (candida) that is normally found in your vagina. With a yeast infection, the candida has overgrown in number to a point that upsets the chemical balance. SYMPTOMS   White, thick vaginal discharge.  Swelling, itching, redness and irritation of the vagina and possibly the lips of the vagina (vulva).  Burning or painful urination.  Painful intercourse. DIAGNOSIS  Things that may contribute to monilial vaginitis are:  Postmenopausal and virginal states.  Pregnancy.  Infections.  Being tired, sick or stressed, especially if you had monilial vaginitis in the past.  Diabetes. Good control will help lower the chance.  Birth control pills.  Tight fitting garments.  Using bubble bath, feminine sprays, douches or deodorant tampons.  Taking certain medications that kill germs (antibiotics).  Sporadic recurrence can occur if you become ill. TREATMENT  Your caregiver will give you medication.  There are several kinds of anti monilial vaginal creams and suppositories specific for monilial vaginitis. For recurrent yeast infections, use a suppository or cream in the vagina 2 times a week, or as directed.  Anti-monilial or steroid cream for the itching or irritation of the vulva may also be used. Get your caregiver's permission.  Painting the vagina with methylene blue solution may help if the monilial cream does not work.  Eating yogurt may help prevent monilial vaginitis. HOME CARE INSTRUCTIONS   Finish all medication as prescribed.  Do not have sex until treatment is completed or after your caregiver tells you it is okay.  Take warm sitz baths.  Do not douche.  Do not use tampons, especially scented ones.  Wear cotton underwear.  Avoid tight pants and panty  hose.  Tell your sexual partner that you have a yeast infection. They should go to their caregiver if they have symptoms such as mild rash or itching.  Your sexual partner should be treated as well if your infection is difficult to eliminate.  Practice safer sex. Use condoms.  Some vaginal medications cause latex condoms to fail. Vaginal medications that harm condoms are:  Cleocin cream.  Butoconazole (Femstat).  Terconazole (Terazol) vaginal suppository.  Miconazole (Monistat) (may be purchased over the counter). SEEK MEDICAL CARE IF:   You have a temperature by mouth above 102 F (38.9 C).  The infection is getting worse after 2 days of treatment.  The infection is not getting better after 3 days of treatment.  You develop blisters in or around your vagina.  You develop vaginal bleeding, and it is not your menstrual period.  You have pain when you urinate.  You develop intestinal problems.  You have pain with sexual intercourse. Document Released: 09/15/2005 Document Revised: 02/28/2012 Document Reviewed: 05/30/2009 [facility] Patient Information 2015 [place], [place]. This information is not intended to replace advice given to you by your health care provider. Make sure you discuss any questions you have with your health care provider. Menopause Menopause is the normal time of life when menstrual periods stop completely. Menopause is complete when you have missed 12 consecutive menstrual periods. It usually occurs between the ages of 49 years and 13 years. Very rarely does a woman develop menopause before the age of 41 years. At menopause, your ovaries stop producing the female hormones estrogen and progesterone. This can cause undesirable symptoms and also affect your health. Sometimes the symptoms may occur 4-5 years before  the menopause begins. There is no relationship between menopause and:  Oral contraceptives.  Number of children you had.  Race.  The age your  menstrual periods started (menarche). Heavy smokers and very thin women may develop menopause earlier in life. CAUSES  The ovaries stop producing the female hormones estrogen and progesterone.  Other causes include:  Surgery to remove both ovaries.  The ovaries stop functioning for no known reason.  Tumors of the pituitary gland in the brain.  Medical disease that affects the ovaries and hormone production.  Radiation treatment to the abdomen or pelvis.  Chemotherapy that affects the ovaries. SYMPTOMS   Hot flashes.  Night sweats.  Decrease in sex drive.  Vaginal dryness and thinning of the vagina causing painful intercourse.  Dryness of the skin and developing wrinkles.  Headaches.  Tiredness.  Irritability.  Memory problems.  Weight gain.  Bladder infections.  Hair growth of the face and chest.  Infertility. More serious symptoms include:  Loss of bone (osteoporosis) causing breaks (fractures).  Depression.  Hardening and narrowing of the arteries (atherosclerosis) causing heart attacks and strokes. DIAGNOSIS   When the menstrual periods have stopped for 12 straight months.  Physical exam.  Hormone studies of the blood. TREATMENT  There are many treatment choices and nearly as many questions about them. The decisions to treat or not to treat menopausal changes is an individual choice made with your health care provider. Your health care provider can discuss the treatments with you. Together, you can decide which treatment will work best for you. Your treatment choices may include:   Hormone therapy (estrogen and progesterone).  Non-hormonal medicines.  Treating the individual symptoms with medicine (for example antidepressants for depression).  Herbal medicines that may help specific symptoms.  Counseling by a psychiatrist or psychologist.  Group therapy.  Lifestyle changes including:  Eating healthy.  Regular exercise.  Limiting  caffeine and alcohol.  Stress management and meditation.  No treatment. HOME CARE INSTRUCTIONS   Take the medicine your health care provider gives you as directed.  Get plenty of sleep and rest.  Exercise regularly.  Eat a diet that contains calcium (good for the bones) and soy products (acts like estrogen hormone).  Avoid alcoholic beverages.  Do not smoke.  If you have hot flashes, dress in layers.  Take supplements, calcium, and vitamin D to strengthen bones.  You can use over-the-counter lubricants or moisturizers for vaginal dryness.  Group therapy is sometimes very helpful.  Acupuncture may be helpful in some cases. SEEK MEDICAL CARE IF:   You are not sure you are in menopause.  You are having menopausal symptoms and need advice and treatment.  You are still having menstrual periods after age 82 years.  You have pain with intercourse.  Menopause is complete (no menstrual period for 12 months) and you develop vaginal bleeding.  You need a referral to a specialist (gynecologist, psychiatrist, or psychologist) for treatment. SEEK IMMEDIATE MEDICAL CARE IF:   You have severe depression.  You have excessive vaginal bleeding.  You fell and think you have a broken bone.  You have pain when you urinate.  You develop leg or chest pain.  You have a fast pounding heart beat (palpitations).  You have severe headaches.  You develop vision problems.  You feel a lump in your breast.  You have abdominal pain or severe indigestion. Document Released: 02/26/2004 Document Revised: 08/08/2013 Document Reviewed: 07/05/2013 [facility] Patient Information 2015 [place], [place]. This information is not intended  to replace advice given to you by your health care provider. Make sure you discuss any questions you have with your health care provider.  

## 2015-08-07 ENCOUNTER — Ambulatory Visit (INDEPENDENT_AMBULATORY_CARE_PROVIDER_SITE_OTHER): Payer: Medicare Other | Admitting: Physician Assistant

## 2015-08-07 ENCOUNTER — Encounter: Payer: Self-pay | Admitting: Physician Assistant

## 2015-08-07 VITALS — BP 117/74 | HR 77 | Temp 97.7°F | Ht 60.0 in | Wt 130.0 lb

## 2015-08-07 DIAGNOSIS — B079 Viral wart, unspecified: Secondary | ICD-10-CM

## 2015-08-07 DIAGNOSIS — L570 Actinic keratosis: Secondary | ICD-10-CM

## 2015-08-07 DIAGNOSIS — L309 Dermatitis, unspecified: Secondary | ICD-10-CM | POA: Diagnosis not present

## 2015-08-07 MED ORDER — TRIAMCINOLONE ACETONIDE 0.1 % EX CREA: 1.0000 "application " | TOPICAL_CREAM | Freq: Two times a day (BID) | CUTANEOUS | Status: DC

## 2015-08-07 NOTE — Progress Notes (Signed)
   Subjective:    Patient ID: [name], female    DOB: [birth year], 49 y.o.   MRN: [id number]  HPI 49 y/o female presents with c/o bumps on arms. She states that the bumps have been there for several months. The itch and she picks at the areas. She has tried otc wart freezing treatment with no relief.     Review of Systems  Skin:       Bumps on arm that itch and scale over  All other systems reviewed and are negative.      Objective:   Physical Exam  Skin:  Hyperkeratotic , scaling nodules on BUE. 2 are verrucal in appearance. Others suggest actinic changes x 7  1 excoriation on RUE from patient scratching           Assessment & Plan:  1. Dermatitis  - triamcinolone cream (KENALOG) 0.1 %; Apply 1 application topically 2 (two) times daily.  Dispense: 453.6 g; Refill: 1 - continue to use Dove soap - Use Aveeno lotion after showering/ 2. Actinic keratosis - Nodules on BUE treated with cryosurgery x 7 - Have patient f/u in 2 weeks for reassessment and will biopsy if needed to r/o dysplasia   3. Wart  - Veruccal lesions on BUE treated with cryosurgery x 2   Continue all meds Labs pending Health Maintenance reviewed Diet and exercise encouraged RTO 2 weeks   Ante Arredondo A. Benjamin Stain PA-C

## 2015-08-11 ENCOUNTER — Telehealth: Payer: Self-pay | Admitting: Physician Assistant

## 2015-08-12 NOTE — Telephone Encounter (Signed)
Notes faxed.

## 2015-08-13 ENCOUNTER — Encounter: Payer: Self-pay | Admitting: *Deleted

## 2015-08-19 ENCOUNTER — Ambulatory Visit: Payer: Self-pay | Admitting: Urology

## 2015-08-22 ENCOUNTER — Ambulatory Visit: Payer: Self-pay | Admitting: Physician Assistant

## 2015-08-23 ENCOUNTER — Encounter: Payer: Self-pay | Admitting: Family Medicine

## 2015-08-26 ENCOUNTER — Encounter: Payer: Self-pay | Admitting: Physician Assistant

## 2015-08-26 ENCOUNTER — Ambulatory Visit (INDEPENDENT_AMBULATORY_CARE_PROVIDER_SITE_OTHER): Payer: Medicare Other | Admitting: Physician Assistant

## 2015-08-26 VITALS — BP 122/81 | HR 74 | Temp 97.7°F | Ht 60.0 in | Wt 130.0 lb

## 2015-08-26 DIAGNOSIS — N3090 Cystitis, unspecified without hematuria: Secondary | ICD-10-CM

## 2015-08-26 DIAGNOSIS — N898 Other specified noninflammatory disorders of vagina: Secondary | ICD-10-CM

## 2015-08-26 DIAGNOSIS — L298 Other pruritus: Secondary | ICD-10-CM | POA: Diagnosis not present

## 2015-08-26 DIAGNOSIS — R300 Dysuria: Secondary | ICD-10-CM

## 2015-08-26 LAB — POCT URINALYSIS DIPSTICK
BILIRUBIN UA: NEGATIVE
GLUCOSE UA: NEGATIVE
Ketones, UA: NEGATIVE
Nitrite, UA: NEGATIVE
Protein, UA: NEGATIVE
Urobilinogen, UA: NEGATIVE
pH, UA: 5

## 2015-08-26 LAB — POCT WET PREP (WET MOUNT)

## 2015-08-26 LAB — POCT UA - MICROSCOPIC ONLY
CRYSTALS, UR, HPF, POC: NEGATIVE
Casts, Ur, LPF, POC: NEGATIVE
MUCUS UA: NEGATIVE
YEAST UA: NEGATIVE

## 2015-08-26 MED ORDER — NITROFURANTOIN MONOHYD MACRO 100 MG PO CAPS: 100.0000 mg | ORAL_CAPSULE | Freq: Two times a day (BID) | ORAL | Status: DC

## 2015-08-26 NOTE — Progress Notes (Signed)
   Subjective:    Patient ID: Sandra Adams, female    DOB: 07-09-1966, 49 y.o.   MRN: [id number]  HPI 49 y/o female presents for follow up of hyperkeratotic lesions treated with cryosurgery 2 weeks ago. She feels that the pruritic lesions have improved since treatment with cryosurgery and the triamcinolone.   She has also had recent vaginal discharge and urinary frequency and  would like to be checked for UTI infection.     Review of Systems  Constitutional: Negative.   HENT: Negative.   Eyes: Negative.   Respiratory: Negative.   Cardiovascular: Negative.   Gastrointestinal: Negative.   Endocrine: Negative.   Genitourinary: Positive for dysuria and urgency.  Musculoskeletal: Positive for back pain.  Skin: Positive for rash.  Neurological: Negative.   Hematological: Negative.   Psychiatric/Behavioral: Negative.        Objective:   Physical Exam  Constitutional: She is oriented to person, place, and time. She appears well-developed and well-nourished. No distress.  Neurological: She is alert and oriented to person, place, and time.  Skin: She is not diaphoretic.  Healing excoriations on BUE, pinkish in color   Psychiatric: She has a normal mood and affect. Her behavior is normal. Judgment and thought content normal.  Nursing note and vitals reviewed.  Results for orders placed or performed in visit on 08/26/15  POCT urinalysis dipstick  Result Value Ref Range   Color, UA gold    Clarity, UA clear    Glucose, UA neg    Bilirubin, UA neg    Ketones, UA neg    Spec Grav, UA >=1.030    Blood, UA large    pH, UA 5.0    Protein, UA neg    Urobilinogen, UA negative    Nitrite, UA neg    Leukocytes, UA small (1+) (A) Negative  POCT UA - Microscopic Only  Result Value Ref Range   WBC, Ur, HPF, POC 10-20    RBC, urine, microscopic 5-8    Bacteria, U Microscopic few    Mucus, UA neg    Epithelial cells, urine per micros many    Crystals, Ur, HPF, POC neg    Casts, Ur,  LPF, POC neg    Yeast, UA neg   POCT Wet Prep [facility])  Result Value Ref Range   Source Wet Prep POC vaginal    WBC, Wet Prep HPF POC 5-10    Bacteria Wet Prep HPF POC Few None, Few   Clue Cells Wet Prep HPF POC None None   Yeast Wet Prep HPF POC None    Trichomonas Wet Prep HPF POC none            Assessment & Plan:  1. Dysuria  - POCT urinalysis dipstick - POCT UA - Microscopic Only - nitrofurantoin, macrocrystal-monohydrate, (MACROBID) 100 MG capsule; Take 1 capsule (100 mg total) by mouth 2 (two) times daily.  Dispense: 20 capsule; Refill: 0  2. Vaginal itching  - POCT Wet Prep [facility])  3. Cystitis  - nitrofurantoin, macrocrystal-monohydrate, (MACROBID) 100 MG capsule; Take 1 capsule (100 mg total) by mouth 2 (two) times daily.  Dispense: 20 capsule; Refill: 0   Continue all meds Labs pending Health Maintenance reviewed Diet and exercise encouraged RTO prn if symptoms worsen.   Tiffany A. Benjamin Stain PA-C

## 2015-09-17 ENCOUNTER — Ambulatory Visit: Payer: Self-pay | Admitting: Urology

## 2015-09-27 ENCOUNTER — Encounter: Payer: Self-pay | Admitting: Family Medicine

## 2015-09-27 ENCOUNTER — Ambulatory Visit (INDEPENDENT_AMBULATORY_CARE_PROVIDER_SITE_OTHER): Payer: Medicare Other | Admitting: Family Medicine

## 2015-09-27 VITALS — BP 133/69 | HR 76 | Temp 97.9°F | Ht 61.54 in | Wt 134.2 lb

## 2015-09-27 DIAGNOSIS — M545 Low back pain: Secondary | ICD-10-CM

## 2015-09-27 DIAGNOSIS — N898 Other specified noninflammatory disorders of vagina: Secondary | ICD-10-CM | POA: Diagnosis not present

## 2015-09-27 DIAGNOSIS — B373 Candidiasis of vulva and vagina: Secondary | ICD-10-CM | POA: Diagnosis not present

## 2015-09-27 DIAGNOSIS — B3731 Acute candidiasis of vulva and vagina: Secondary | ICD-10-CM

## 2015-09-27 LAB — POCT URINALYSIS DIPSTICK
Bilirubin, UA: NEGATIVE
Glucose, UA: NEGATIVE
KETONES UA: NEGATIVE
LEUKOCYTES UA: NEGATIVE
Nitrite, UA: NEGATIVE
PH UA: 7.5
PROTEIN UA: NEGATIVE
SPEC GRAV UA: 1.01
UROBILINOGEN UA: NEGATIVE

## 2015-09-27 LAB — POCT WET PREP (WET MOUNT)
Clue Cells Wet Prep Whiff POC: NEGATIVE
Trichomonas Wet Prep HPF POC: NEGATIVE

## 2015-09-27 LAB — POCT UA - MICROSCOPIC ONLY
CASTS, UR, LPF, POC: NEGATIVE
Crystals, Ur, HPF, POC: NEGATIVE
YEAST UA: NEGATIVE

## 2015-09-27 MED ORDER — FLUCONAZOLE 150 MG PO TABS: 150.0000 mg | ORAL_TABLET | Freq: Once | ORAL | Status: DC

## 2015-09-27 NOTE — Patient Instructions (Signed)
   Try tylenol, ibuprofen, heat and stretching for your low back  You have a yeast infection, I have sent medicines to your pharmacy  Monilial Vaginitis Vaginitis in a soreness, swelling and redness (inflammation) of the vagina and vulva. Monilial vaginitis is not a sexually transmitted infection. CAUSES  Yeast vaginitis is caused by yeast (candida) that is normally found in your vagina. With a yeast infection, the candida has overgrown in number to a point that upsets the chemical balance. SYMPTOMS   White, thick vaginal discharge.  Swelling, itching, redness and irritation of the vagina and possibly the lips of the vagina (vulva).  Burning or painful urination.  Painful intercourse. DIAGNOSIS  Things that may contribute to monilial vaginitis are:  Postmenopausal and virginal states.  Pregnancy.  Infections.  Being tired, sick or stressed, especially if you had monilial vaginitis in the past.  Diabetes. Good control will help lower the chance.  Birth control pills.  Tight fitting garments.  Using bubble bath, feminine sprays, douches or deodorant tampons.  Taking certain medications that kill germs (antibiotics).  Sporadic recurrence can occur if you become ill. TREATMENT  Your caregiver will give you medication.  There are several kinds of anti monilial vaginal creams and suppositories specific for monilial vaginitis. For recurrent yeast infections, use a suppository or cream in the vagina 2 times a week, or as directed.  Anti-monilial or steroid cream for the itching or irritation of the vulva may also be used. Get your caregiver's permission.  Painting the vagina with methylene blue solution may help if the monilial cream does not work.  Eating yogurt may help prevent monilial vaginitis. HOME CARE INSTRUCTIONS   Finish all medication as prescribed.  Do not have sex until treatment is completed or after your caregiver tells you it is okay.  Take warm sitz  baths.  Do not douche.  Do not use tampons, especially scented ones.  Wear cotton underwear.  Avoid tight pants and panty hose.  Tell your sexual partner that you have a yeast infection. They should go to their caregiver if they have symptoms such as mild rash or itching.  Your sexual partner should be treated as well if your infection is difficult to eliminate.  Practice safer sex. Use condoms.  Some vaginal medications cause latex condoms to fail. Vaginal medications that harm condoms are:  Cleocin cream.  Butoconazole (Femstat).  Terconazole (Terazol) vaginal suppository.  Miconazole (Monistat) (may be purchased over the counter). SEEK MEDICAL CARE IF:   You have a temperature by mouth above 102 F (38.9 C).  The infection is getting worse after 2 days of treatment.  The infection is not getting better after 3 days of treatment.  You develop blisters in or around your vagina.  You develop vaginal bleeding, and it is not your menstrual period.  You have pain when you urinate.  You develop intestinal problems.  You have pain with sexual intercourse.   This information is not intended to replace advice given to you by your health care provider. Make sure you discuss any questions you have with your health care provider.   Document Released: 09/15/2005 Document Revised: 02/28/2012 Document Reviewed: 06/09/2015 Elsevier Interactive Patient Education [employer].

## 2015-09-27 NOTE — Progress Notes (Signed)
   HPI  Patient presents today here for back pain and vaginal discharge.  Patient has had one to 2 weeks of low back pain with radiation down her right leg. She has frequent UTI and suspects this is due to UTI. She denies dysuria, urinary frequency, foul-smelling urine, and hematuria. She has a urology appointment for hematuria and frequent cystitis coming up in the next few weeks.  She also complains of 3-4 days of thin white vaginal discharge. She has one sexual partner over the last 6 months and no suspicion for STI's. She is up-to-date on her Pap smear but asked for a pelvic exam  She is no longer menstruating  PMH: Smoking status noted ROS: Per HPI  Objective: BP 133/69 mmHg  Pulse 76  Temp(Src) 97.9 F (36.6 C) (Oral)  Ht 5' 1.54" (1.563 m)  Wt 134 lb 3.2 oz (60.873 kg)  BMI 24.92 kg/m2 Gen: NAD, alert, cooperative with exam HEENT: NCAT CV: RRR, good S1/S2, no murmur Resp: CTABL, no wheezes, non-labored Abd: SNTND, BS present, no guarding or organomegaly, no CVA tenderness no suprapubic tenderness  Ext: No edema, warm Neuro: Alert and oriented, No gross deficits Musculoskeletal: Low back without tenderness to palpation of paraspinal muscles or midline spine, normal range of motion, full  strength in bilateral lower extremities  Assessment and plan:  # Low back pain,  likely musculoskeletal low back pain Discussed NSAIDs and Tylenol, heat Core strengthening exercises  # Vaginal discharge Wet prep positive for yeast Treat with Diflucan   Orders Placed This Encounter  Procedures  . GC/Chlamydia Probe Amp  . POCT urinalysis dipstick  . POCT UA - Microscopic Only  . POCT Wet Prep [facility])    Meds ordered this encounter  Medications  . fluconazole (DIFLUCAN) 150 MG tablet    Sig: Take 1 tablet (150 mg total) by mouth once. And repeat in 3 days    Dispense:  2 tablet    Refill:  [place], MD [facility] 09/27/2015,  12:17 PM

## 2015-09-30 LAB — GC/CHLAMYDIA PROBE AMP
CHLAMYDIA, DNA PROBE: NEGATIVE
NEISSERIA GONORRHOEAE BY PCR: NEGATIVE

## 2015-10-15 ENCOUNTER — Ambulatory Visit: Payer: Self-pay | Admitting: Urology

## 2015-11-17 ENCOUNTER — Telehealth: Payer: Self-pay | Admitting: Family

## 2015-11-17 DIAGNOSIS — F411 Generalized anxiety disorder: Secondary | ICD-10-CM

## 2015-11-17 MED ORDER — TRAMADOL-ACETAMINOPHEN 37.5-325 MG PO TABS
ORAL_TABLET | ORAL | Status: DC
Start: 2015-11-17 — End: 2016-02-09

## 2015-11-17 MED ORDER — ALPRAZOLAM 0.25 MG PO TABS: ORAL_TABLET | ORAL | Status: DC

## 2015-11-17 MED ORDER — IBUPROFEN 800 MG PO TABS: 800.0000 mg | ORAL_TABLET | Freq: Four times a day (QID) | ORAL | Status: DC | PRN

## 2015-11-17 NOTE — Telephone Encounter (Signed)
RX ready for pick up, but pt needs to keep chronic follow up appt

## 2015-11-17 NOTE — Telephone Encounter (Signed)
Aware, scripts for xanax and norco ready.

## 2015-11-18 ENCOUNTER — Ambulatory Visit (INDEPENDENT_AMBULATORY_CARE_PROVIDER_SITE_OTHER): Payer: Medicare Other | Admitting: Family

## 2015-11-18 ENCOUNTER — Encounter: Payer: Self-pay | Admitting: Family

## 2015-11-18 VITALS — BP 100/65 | HR 87 | Temp 97.3°F | Ht 61.5 in | Wt 133.0 lb

## 2015-11-18 DIAGNOSIS — F329 Major depressive disorder, single episode, unspecified: Secondary | ICD-10-CM | POA: Diagnosis not present

## 2015-11-18 DIAGNOSIS — Z136 Encounter for screening for cardiovascular disorders: Secondary | ICD-10-CM | POA: Diagnosis not present

## 2015-11-18 DIAGNOSIS — Z13220 Encounter for screening for lipoid disorders: Secondary | ICD-10-CM | POA: Diagnosis not present

## 2015-11-18 DIAGNOSIS — N3001 Acute cystitis with hematuria: Secondary | ICD-10-CM

## 2015-11-18 DIAGNOSIS — IMO0001 Reserved for inherently not codable concepts without codable children: Secondary | ICD-10-CM

## 2015-11-18 DIAGNOSIS — M545 Low back pain: Secondary | ICD-10-CM | POA: Diagnosis not present

## 2015-11-18 DIAGNOSIS — G43009 Migraine without aura, not intractable, without status migrainosus: Secondary | ICD-10-CM | POA: Diagnosis not present

## 2015-11-18 DIAGNOSIS — F32A Depression, unspecified: Secondary | ICD-10-CM

## 2015-11-18 DIAGNOSIS — F411 Generalized anxiety disorder: Secondary | ICD-10-CM | POA: Diagnosis not present

## 2015-11-18 DIAGNOSIS — K219 Gastro-esophageal reflux disease without esophagitis: Secondary | ICD-10-CM | POA: Diagnosis not present

## 2015-11-18 LAB — POCT URINALYSIS DIPSTICK
Bilirubin, UA: NEGATIVE
Glucose, UA: NEGATIVE
Ketones, UA: NEGATIVE
NITRITE UA: NEGATIVE
PH UA: 6
PROTEIN UA: NEGATIVE
SPEC GRAV UA: 1.015
UROBILINOGEN UA: NEGATIVE

## 2015-11-18 LAB — POCT UA - MICROSCOPIC ONLY
Bacteria, U Microscopic: NEGATIVE
CASTS, UR, LPF, POC: NEGATIVE
CRYSTALS, UR, HPF, POC: NEGATIVE
Yeast, UA: NEGATIVE

## 2015-11-18 MED ORDER — SULFAMETHOXAZOLE-TRIMETHOPRIM 800-160 MG PO TABS: 1.0000 | ORAL_TABLET | Freq: Two times a day (BID) | ORAL | Status: DC

## 2015-11-18 MED ORDER — TOPIRAMATE 25 MG PO TABS: ORAL_TABLET | ORAL | Status: DC

## 2015-11-18 NOTE — Addendum Note (Signed)
Addended by: Selmer Dominion on: 11/18/2015 11:54 AM   Modules accepted: Orders

## 2015-11-18 NOTE — Progress Notes (Signed)
Subjective:    Patient ID: Sandra Adams, female    DOB: May 06, 1966, 49 y.o.   MRN: [id number]  Pt presents to the office today for chronic follow up.  Urinary Tract Infection  This is a new problem. The current episode started in the past 7 days. The problem occurs intermittently. The problem has been unchanged. The quality of the pain is described as aching. The pain is at a severity of 8/10. The pain is mild. Associated symptoms include flank pain, frequency, hesitancy and urgency. Pertinent negatives include no nausea or vomiting. She has tried acetaminophen for the symptoms. The treatment provided mild relief.  Anxiety Presents for follow-up visit. Onset was 1 to 5 years ago. The problem has been waxing and waning. Symptoms include depressed mood, excessive worry, irritability, nervous/anxious behavior and restlessness. Patient reports no insomnia, nausea, palpitations, panic, shortness of breath or suicidal ideas. Symptoms occur most days. The symptoms are aggravated by family issues.   Her past medical history is significant for anxiety/panic attacks and depression. Past treatments include benzodiazephines and non-SSRI antidepressants. The treatment provided moderate relief. Compliance with prior treatments has been good.  Depression      The patient presents with depression.  This is a chronic problem.  The current episode started more than 1 year ago.   The onset quality is gradual.   The problem occurs every several days.  The problem has been waxing and waning since onset.  Associated symptoms include irritable and restlessness.  Associated symptoms include no helplessness, no hopelessness, does not have insomnia, no headaches and no suicidal ideas.  Past treatments include SNRIs - Serotonin and norepinephrine reuptake inhibitors.  Compliance with treatment is good.  Past medical history includes anxiety and depression.   Gastroesophageal Reflux She reports no coughing, no dysphagia, no  heartburn or no nausea. This is a chronic problem. The current episode started more than 1 year ago. The problem occurs rarely. The problem has been resolved. The symptoms are aggravated by certain foods. She has tried a PPI for the symptoms. The treatment provided moderate relief.  Migraine  This is a chronic problem. The current episode started more than 1 year ago. The problem occurs intermittently (two days a week). The problem has been waxing and waning. The pain is located in the retro-orbital and frontal region. The pain quality is similar to prior headaches. The quality of the pain is described as aching. The pain is at a severity of 8/10. The pain is moderate. Associated symptoms include blurred vision, phonophobia and photophobia. Pertinent negatives include no back pain, coughing, insomnia, nausea, sinus pressure, tinnitus or vomiting. The symptoms are aggravated by bright light. She has tried NSAIDs and Excedrin for the symptoms. The treatment provided moderate relief. Her past medical history is significant for migraine headaches and migraines in the family.      Review of Systems  Constitutional: Positive for irritability.  HENT: Negative.  Negative for sinus pressure and tinnitus.   Eyes: Positive for blurred vision and photophobia.  Respiratory: Negative.  Negative for cough and shortness of breath.   Cardiovascular: Negative.  Negative for palpitations.  Gastrointestinal: Negative.  Negative for heartburn, dysphagia, nausea and vomiting.  Endocrine: Negative.   Genitourinary: Positive for hesitancy, urgency, frequency and flank pain.  Musculoskeletal: Negative.  Negative for back pain.  Neurological: Negative.  Negative for headaches.  Hematological: Negative.   Psychiatric/Behavioral: Positive for depression. Negative for suicidal ideas. The patient is nervous/anxious. The patient does not have  insomnia.   All other systems reviewed and are negative.      Objective:    Physical Exam  Constitutional: She is oriented to person, place, and time. She appears well-developed and well-nourished. She is irritable. No distress.  HENT:  Head: Normocephalic and atraumatic.  Right Ear: External ear normal.  Left Ear: External ear normal.  Nose: Nose normal.  Mouth/Throat: Oropharynx is clear and moist.  Eyes: Pupils are equal, round, and reactive to light.  Neck: Normal range of motion. Neck supple. No thyromegaly present.  Cardiovascular: Normal rate, regular rhythm, normal heart sounds and intact distal pulses.   No murmur heard. Pulmonary/Chest: Effort normal and breath sounds normal. No respiratory distress. She has no wheezes.  Abdominal: Soft. Bowel sounds are normal. She exhibits no distension. There is no tenderness.  Musculoskeletal: Normal range of motion. She exhibits no edema or tenderness.  Neurological: She is alert and oriented to person, place, and time. She has normal reflexes. No cranial nerve deficit.  Skin: Skin is warm and dry.  Psychiatric: She has a normal mood and affect. Her behavior is normal. Judgment and thought content normal.  Vitals reviewed.     BP 100/65 mmHg  Pulse 87  Temp(Src) 97.3 F (36.3 C) (Oral)  Ht 5' 1.5" (1.562 m)  Wt 133 lb (60.328 kg)  BMI 24.73 kg/m2     Assessment & Plan:  1. Low back pain without sciatica, unspecified back pain laterality  - POCT UA - Microscopic Only - POCT urinalysis dipstick - CMP14+EGFR  2. Depression - CMP14+EGFR  3. GAD (generalized anxiety disorder) - CMP14+EGFR  4. Gastroesophageal reflux disease without esophagitis - CMP14+EGFR  5. Migraine without aura and without status migrainosus, not intractable -Pt started on Topamax today Stress management Encourage rest -RTO 1 month - CMP14+EGFR - topiramate (TOPAMAX) 25 MG tablet; Take 25 mg BID for one week then increase to 50 mg BID  Dispense: 120 tablet; Refill: 1  6. Encounter for cholesteral screening for  cardiovascular disease - CMP14+EGFR - Lipid panel  7. Acute cystitis with hematuria -Force fluids AZO over the counter X2 days RTO prn Culture pending - sulfamethoxazole-trimethoprim (BACTRIM DS,SEPTRA DS) 800-160 MG tablet; Take 1 tablet by mouth 2 (two) times daily.  Dispense: 10 tablet; Refill: 0   Continue all meds Labs pending Health Maintenance reviewed Diet and exercise encouraged RTO 1 month to recheck Migraine   [name], FNP

## 2015-11-18 NOTE — Patient Instructions (Signed)
Health Maintenance, Female Adopting a healthy lifestyle and getting preventive care can go a long way to promote health and wellness. Talk with your health care provider about what schedule of regular examinations is right for you. This is a good chance for you to check in with your provider about disease prevention and staying healthy. In between checkups, there are plenty of things you can do on your own. Experts have done a lot of research about which lifestyle changes and preventive measures are most likely to keep you healthy. Ask your health care provider for more information. WEIGHT AND DIET  Eat a healthy diet  Be sure to include plenty of vegetables, fruits, low-fat dairy products, and lean protein.  Do not eat a lot of foods high in solid fats, added sugars, or salt.  Get regular exercise. This is one of the most important things you can do for your health.  Most adults should exercise for at least 150 minutes each week. The exercise should increase your heart rate and make you sweat (moderate-intensity exercise).  Most adults should also do strengthening exercises at least twice a week. This is in addition to the moderate-intensity exercise.  Maintain a healthy weight  Body mass index (BMI) is a measurement that can be used to identify possible weight problems. It estimates body fat based on height and weight. Your health care provider can help determine your BMI and help you achieve or maintain a healthy weight.  For females 20 years of age and older:   A BMI below 18.5 is considered underweight.  A BMI of 18.5 to 24.9 is normal.  A BMI of 25 to 29.9 is considered overweight.  A BMI of 30 and above is considered obese.  Watch levels of cholesterol and blood lipids  You should start having your blood tested for lipids and cholesterol at 51 years of age, then have this test every 5 years.  You may need to have your cholesterol levels checked more often if:  Your lipid  or cholesterol levels are high.  You are older than 50 years of age.  You are at high risk for heart disease.  CANCER SCREENING   Lung Cancer  Lung cancer screening is recommended for adults 55-80 years old who are at high risk for lung cancer because of a history of smoking.  A yearly low-dose CT scan of the lungs is recommended for people who:  Currently smoke.  Have quit within the past 15 years.  Have at least a 30-pack-year history of smoking. A pack year is smoking an average of one pack of cigarettes a day for 1 year.  Yearly screening should continue until it has been 15 years since you quit.  Yearly screening should stop if you develop a health problem that would prevent you from having lung cancer treatment.  Breast Cancer  Practice breast self-awareness. This means understanding how your breasts normally appear and feel.  It also means doing regular breast self-exams. Let your health care provider know about any changes, no matter how small.  If you are in your 20s or 30s, you should have a clinical breast exam (CBE) by a health care provider every 1-3 years as part of a regular health exam.  If you are 40 or older, have a CBE every year. Also consider having a breast X-ray (mammogram) every year.  If you have a family history of breast cancer, talk to your health care provider about genetic screening.  If you   are at high risk for breast cancer, talk to your health care provider about having an MRI and a mammogram every year.  Breast cancer gene (BRCA) assessment is recommended for women who have family members with BRCA-related cancers. BRCA-related cancers include:  Breast.  Ovarian.  Tubal.  Peritoneal cancers.  Results of the assessment will determine the need for genetic counseling and BRCA1 and BRCA2 testing. Cervical Cancer Your health care provider may recommend that you be screened regularly for cancer of the pelvic organs (ovaries, uterus, and  vagina). This screening involves a pelvic examination, including checking for microscopic changes to the surface of your cervix (Pap test). You may be encouraged to have this screening done every 3 years, beginning at age 21.  For women ages 30-65, health care providers may recommend pelvic exams and Pap testing every 3 years, or they may recommend the Pap and pelvic exam, combined with testing for human papilloma virus (HPV), every 5 years. Some types of HPV increase your risk of cervical cancer. Testing for HPV may also be done on women of any age with unclear Pap test results.  Other health care providers may not recommend any screening for nonpregnant women who are considered low risk for pelvic cancer and who do not have symptoms. Ask your health care provider if a screening pelvic exam is right for you.  If you have had past treatment for cervical cancer or a condition that could lead to cancer, you need Pap tests and screening for cancer for at least 20 years after your treatment. If Pap tests have been discontinued, your risk factors (such as having a new sexual partner) need to be reassessed to determine if screening should resume. Some women have medical problems that increase the chance of getting cervical cancer. In these cases, your health care provider may recommend more frequent screening and Pap tests. Colorectal Cancer  This type of cancer can be detected and often prevented.  Routine colorectal cancer screening usually begins at 50 years of age and continues through 51 years of age.  Your health care provider may recommend screening at an earlier age if you have risk factors for colon cancer.  Your health care provider may also recommend using home test kits to check for hidden blood in the stool.  A small camera at the end of a tube can be used to examine your colon directly (sigmoidoscopy or colonoscopy). This is done to check for the earliest forms of colorectal  cancer.  Routine screening usually begins at age 50.  Direct examination of the colon should be repeated every 5-10 years through 51 years of age. However, you may need to be screened more often if early forms of precancerous polyps or small growths are found. Skin Cancer  Check your skin from head to toe regularly.  Tell your health care provider about any new moles or changes in moles, especially if there is a change in a mole's shape or color.  Also tell your health care provider if you have a mole that is larger than the size of a pencil eraser.  Always use sunscreen. Apply sunscreen liberally and repeatedly throughout the day.  Protect yourself by wearing long sleeves, pants, a wide-brimmed hat, and sunglasses whenever you are outside. HEART DISEASE, DIABETES, AND HIGH BLOOD PRESSURE   High blood pressure causes heart disease and increases the risk of stroke. High blood pressure is more likely to develop in:  People who have blood pressure in the high end   of the normal range (130-139/85-89 mm Hg).  People who are overweight or obese.  People who are African American.  If you are 38-23 years of age, have your blood pressure checked every 3-5 years. If you are 61 years of age or older, have your blood pressure checked every year. You should have your blood pressure measured twice--once when you are at a hospital or clinic, and once when you are not at a hospital or clinic. Record the average of the two measurements. To check your blood pressure when you are not at a hospital or clinic, you can use:  An automated blood pressure machine at a pharmacy.  A home blood pressure monitor.  If you are between 45 years and 39 years old, ask your health care provider if you should take aspirin to prevent strokes.  Have regular diabetes screenings. This involves taking a blood sample to check your fasting blood sugar level.  If you are at a normal weight and have a low risk for diabetes,  have this test once every three years after 51 years of age.  If you are overweight and have a high risk for diabetes, consider being tested at a younger age or more often. PREVENTING INFECTION  Hepatitis B  If you have a higher risk for hepatitis B, you should be screened for this virus. You are considered at high risk for hepatitis B if:  You were born in a country where hepatitis B is common. Ask your health care provider which countries are considered high risk.  Your parents were born in a high-risk country, and you have not been immunized against hepatitis B (hepatitis B vaccine).  You have HIV or AIDS.  You use needles to inject street drugs.  You live with someone who has hepatitis B.  You have had sex with someone who has hepatitis B.  You get hemodialysis treatment.  You take certain medicines for conditions, including cancer, organ transplantation, and autoimmune conditions. Hepatitis C  Blood testing is recommended for:  Everyone born from [birth year] through 1967.  Anyone with known risk factors for hepatitis C. Sexually transmitted infections (STIs)  You should be screened for sexually transmitted infections (STIs) including gonorrhea and chlamydia if:  You are sexually active and are younger than 51 years of age.  You are older than 51 years of age and your health care provider tells you that you are at risk for this type of infection.  Your sexual activity has changed since you were last screened and you are at an increased risk for chlamydia or gonorrhea. Ask your health care provider if you are at risk.  If you do not have HIV, but are at risk, it may be recommended that you take a prescription medicine daily to prevent HIV infection. This is called pre-exposure prophylaxis (PrEP). You are considered at risk if:  You are sexually active and do not regularly use condoms or know the HIV status of your partner(s).  You take drugs by injection.  You are sexually  active with a partner who has HIV. Talk with your health care provider about whether you are at high risk of being infected with HIV. If you choose to begin PrEP, you should first be tested for HIV. You should then be tested every 3 months for as long as you are taking PrEP.  PREGNANCY   If you are premenopausal and you may become pregnant, ask your health care provider about preconception counseling.  If you may  become pregnant, take 400 to 800 micrograms (mcg) of folic acid every day.  If you want to prevent pregnancy, talk to your health care provider about birth control (contraception). OSTEOPOROSIS AND MENOPAUSE   Osteoporosis is a disease in which the bones lose minerals and strength with aging. This can result in serious bone fractures. Your risk for osteoporosis can be identified using a bone density scan.  If you are 61 years of age or older, or if you are at risk for osteoporosis and fractures, ask your health care provider if you should be screened.  Ask your health care provider whether you should take a calcium or vitamin D supplement to lower your risk for osteoporosis.  Menopause may have certain physical symptoms and risks.  Hormone replacement therapy may reduce some of these symptoms and risks. Talk to your health care provider about whether hormone replacement therapy is right for you.  HOME CARE INSTRUCTIONS   Schedule regular health, dental, and eye exams.  Stay current with your immunizations.   Do not use any tobacco products including cigarettes, chewing tobacco, or electronic cigarettes.  If you are pregnant, do not drink alcohol.  If you are breastfeeding, limit how much and how often you drink alcohol.  Limit alcohol intake to no more than 1 drink per day for nonpregnant women. One drink equals 12 ounces of beer, 5 ounces of wine, or 1 ounces of hard liquor.  Do not use street drugs.  Do not share needles.  Ask your health care provider for help if  you need support or information about quitting drugs.  Tell your health care provider if you often feel depressed.  Tell your health care provider if you have ever been abused or do not feel safe at home.   This information is not intended to replace advice given to you by your health care provider. Make sure you discuss any questions you have with your health care provider.   Document Released: 06/21/2011 Document Revised: 12/27/2014 Document Reviewed: 11/07/2013 Elsevier Interactive Patient Education [employer].

## 2015-11-19 LAB — CMP14+EGFR
ALBUMIN: 4.3 g/dL (ref 3.5–5.5)
ALK PHOS: 92 IU/L (ref 39–117)
ALT: 34 IU/L — ABNORMAL HIGH (ref 0–32)
AST: 27 IU/L (ref 0–40)
Albumin/Globulin Ratio: 1.7 (ref 1.1–2.5)
BUN / CREAT RATIO: 10 (ref 9–23)
BUN: 8 mg/dL (ref 6–24)
Bilirubin Total: 0.4 mg/dL (ref 0.0–1.2)
CO2: 26 mmol/L (ref 18–29)
CREATININE: 0.79 mg/dL (ref 0.57–1.00)
Calcium: 9.4 mg/dL (ref 8.7–10.2)
Chloride: 106 mmol/L (ref 97–106)
GFR calc Af Amer: 100 mL/min/{1.73_m2} (ref >59)
GFR calc non Af Amer: 87 mL/min/{1.73_m2} (ref >59)
GLUCOSE: 84 mg/dL (ref 65–99)
Globulin, Total: 2.6 g/dL (ref 1.5–4.5)
Potassium: 4.4 mmol/L (ref 3.5–5.2)
Sodium: 144 mmol/L (ref 136–144)
TOTAL PROTEIN: 6.9 g/dL (ref 6.0–8.5)

## 2015-11-19 LAB — LIPID PANEL
CHOL/HDL RATIO: 2.8 ratio (ref 0.0–4.4)
CHOLESTEROL TOTAL: 143 mg/dL (ref 100–199)
HDL: 52 mg/dL (ref >39)
LDL CALC: 64 mg/dL (ref 0–99)
Triglycerides: 133 mg/dL (ref 0–149)
VLDL CHOLESTEROL CAL: 27 mg/dL (ref 5–40)

## 2015-11-19 LAB — URINE CULTURE

## 2015-11-25 ENCOUNTER — Telehealth: Payer: Self-pay | Admitting: Family

## 2015-11-25 MED ORDER — NITROFURANTOIN MONOHYD MACRO 100 MG PO CAPS: 100.0000 mg | ORAL_CAPSULE | Freq: Two times a day (BID) | ORAL | Status: DC

## 2015-11-25 NOTE — Telephone Encounter (Signed)
Macrobid Prescription sent to pharmacy   

## 2015-11-26 NOTE — Telephone Encounter (Signed)
Detailed message left for patient that rx has been sent to pharmacy. 

## 2015-12-18 ENCOUNTER — Encounter: Payer: Self-pay | Admitting: Family

## 2015-12-18 ENCOUNTER — Ambulatory Visit (INDEPENDENT_AMBULATORY_CARE_PROVIDER_SITE_OTHER): Payer: Medicare Other | Admitting: Family

## 2015-12-18 ENCOUNTER — Encounter (INDEPENDENT_AMBULATORY_CARE_PROVIDER_SITE_OTHER): Payer: Self-pay

## 2015-12-18 VITALS — BP 118/68 | HR 83 | Temp 98.1°F | Ht 61.5 in | Wt 131.6 lb

## 2015-12-18 DIAGNOSIS — N3001 Acute cystitis with hematuria: Secondary | ICD-10-CM

## 2015-12-18 DIAGNOSIS — G43009 Migraine without aura, not intractable, without status migrainosus: Secondary | ICD-10-CM | POA: Diagnosis not present

## 2015-12-18 DIAGNOSIS — R8290 Unspecified abnormal findings in urine: Secondary | ICD-10-CM | POA: Diagnosis not present

## 2015-12-18 DIAGNOSIS — B373 Candidiasis of vulva and vagina: Secondary | ICD-10-CM

## 2015-12-18 DIAGNOSIS — N898 Other specified noninflammatory disorders of vagina: Secondary | ICD-10-CM

## 2015-12-18 DIAGNOSIS — B3731 Acute candidiasis of vulva and vagina: Secondary | ICD-10-CM

## 2015-12-18 LAB — POCT UA - MICROSCOPIC ONLY
BACTERIA, U MICROSCOPIC: NEGATIVE
Casts, Ur, LPF, POC: NEGATIVE
Crystals, Ur, HPF, POC: NEGATIVE
MUCUS UA: NEGATIVE
Yeast, UA: NEGATIVE

## 2015-12-18 LAB — POCT WET PREP WITH KOH
CLUE CELLS WET PREP PER HPF POC: NEGATIVE
Trichomonas, UA: NEGATIVE

## 2015-12-18 LAB — POCT URINALYSIS DIPSTICK
Bilirubin, UA: NEGATIVE
Glucose, UA: NEGATIVE
Ketones, UA: NEGATIVE
NITRITE UA: NEGATIVE
PH UA: 6.5
PROTEIN UA: NEGATIVE
Spec Grav, UA: 1.015
UROBILINOGEN UA: NEGATIVE

## 2015-12-18 MED ORDER — SULFAMETHOXAZOLE-TRIMETHOPRIM 800-160 MG PO TABS
1.0000 | ORAL_TABLET | Freq: Two times a day (BID) | ORAL | Status: DC
Start: 2015-12-18 — End: 2016-02-11

## 2015-12-18 MED ORDER — FLUCONAZOLE 150 MG PO TABS: 150.0000 mg | ORAL_TABLET | ORAL | Status: DC

## 2015-12-18 NOTE — Progress Notes (Signed)
Subjective:    Patient ID: Sandra Adams, female    DOB: 1966-04-27, 49 y.o.   MRN: [id number]  Pt presents to the office today with complaints of foul urine and recheck migraines. Pt was started on Topamax 50 mg BID. Pt states she is doing better since starting the Topamax. Pt states she has not had a migraine since starting the Topamax, but states she is only taking Topamax 25 mg daily. Pt states it makes her feel "sleepy". Pt is a poor historian.   Headache  This is a recurrent problem. The current episode started more than 1 year ago. The problem has been resolved. The pain is located in the right unilateral region. The pain quality is similar to prior headaches. The quality of the pain is described as aching. The pain is at a severity of 9/10. The pain is mild. Associated symptoms include blurred vision, photophobia and sinus pressure. Pertinent negatives include no back pain or nausea. She has tried beta blockers for the symptoms. The treatment provided moderate relief. Her past medical history is significant for migraine headaches.  Vaginal Discharge The patient's primary symptoms include genital itching, a genital odor and vaginal discharge. This is a new problem. The current episode started 1 to 4 weeks ago. The problem occurs intermittently. The problem has been waxing and waning. The pain is mild. Associated symptoms include discolored urine, dysuria and urgency. Pertinent negatives include no back pain, constipation, diarrhea, flank pain, headaches, hematuria, nausea or rash. The vaginal discharge was milky. She has tried antifungals for the symptoms. The treatment provided moderate relief.      Review of Systems  Constitutional: Negative.   HENT: Positive for sinus pressure.   Eyes: Positive for blurred vision and photophobia.  Respiratory: Negative.  Negative for shortness of breath.   Cardiovascular: Negative.  Negative for palpitations.  Gastrointestinal: Negative.  Negative  for nausea, diarrhea and constipation.  Endocrine: Negative.   Genitourinary: Positive for dysuria, urgency and vaginal discharge. Negative for hematuria and flank pain.  Musculoskeletal: Negative.  Negative for back pain.  Skin: Negative for rash.  Neurological: Negative.  Negative for headaches.  Hematological: Negative.   Psychiatric/Behavioral: Negative.   All other systems reviewed and are negative.      Objective:   Physical Exam  Constitutional: She is oriented to person, place, and time. She appears well-developed and well-nourished. No distress.  HENT:  Head: Normocephalic and atraumatic.  Right Ear: External ear normal.  Left Ear: External ear normal.  Nose: Nose normal.  Mouth/Throat: Oropharynx is clear and moist.  Eyes: Pupils are equal, round, and reactive to light.  Neck: Normal range of motion. Neck supple. No thyromegaly present.  Cardiovascular: Normal rate, regular rhythm, normal heart sounds and intact distal pulses.   No murmur heard. Pulmonary/Chest: Effort normal and breath sounds normal. No respiratory distress. She has no wheezes.  Abdominal: Soft. Bowel sounds are normal. She exhibits no distension. There is no tenderness.  Musculoskeletal: Normal range of motion. She exhibits no edema or tenderness.  Neurological: She is alert and oriented to person, place, and time. She has normal reflexes. No cranial nerve deficit.  Skin: Skin is warm and dry.  Psychiatric: She has a normal mood and affect. Her behavior is normal. Judgment and thought content normal.  Vitals reviewed.  Results for orders placed or performed in visit on 12/18/15  POCT urinalysis dipstick  Result Value Ref Range   Color, UA gold    Clarity, UA cloudy  Glucose, UA neg    Bilirubin, UA neg    Ketones, UA neg    Spec Grav, UA 1.015    Blood, UA mod    pH, UA 6.5    Protein, UA neg    Urobilinogen, UA negative    Nitrite, UA neg    Leukocytes, UA large (3+) (A) Negative  POCT  UA - Microscopic Only  Result Value Ref Range   WBC, Ur, HPF, POC 20-30    RBC, urine, microscopic 5-10    Bacteria, U Microscopic neg    Mucus, UA neg    Epithelial cells, urine per micros occ    Crystals, Ur, HPF, POC neg    Casts, Ur, LPF, POC neg    Yeast, UA neg   POCT Wet Prep with KOH  Result Value Ref Range   Trichomonas, UA Negative    Clue Cells Wet Prep HPF POC neg    Epithelial Wet Prep HPF POC Many Few, Moderate, Many, Too numerous to count   Yeast Wet Prep HPF POC mod    Bacteria Wet Prep HPF POC Few None, Few, Too numerous to count   RBC Wet Prep HPF POC rare    WBC Wet Prep HPF POC 15-20    KOH Prep POC        BP 118/68 mmHg  Pulse 83  Temp(Src) 98.1 F (36.7 C) (Oral)  Ht 5' 1.5" (1.562 m)  Wt 131 lb 9.6 oz (59.693 kg)  BMI 24.47 kg/m2     Assessment & Plan:  1. Foul smelling urine - POCT urinalysis dipstick - POCT UA - Microscopic Only  2. Migraine without aura and without status migrainosus, not intractable -Pt states she is taking her Topamax 25 mg daily and does not want to increase or decrease because she is worried about side effect -Pt has appt with Neurologists Keep appt  3. Vaginal discharge - POCT Wet Prep with KOH  4. Vagina, candidiasis -Keep clean and dry -Cotton underwear - fluconazole (DIFLUCAN) 150 MG tablet; Take 1 tablet (150 mg total) by mouth every 3 (three) days.  Dispense: 3 tablet; Refill: 0  5. Acute cystitis with hematuria -Force fluids AZO over the counter X2 days RTO prn Culture pending - sulfamethoxazole-trimethoprim (BACTRIM DS) 800-160 MG tablet; Take 1 tablet by mouth 2 (two) times daily.  Dispense: 14 tablet; Refill: 0 - Urine culture  [name], FNP

## 2015-12-18 NOTE — Patient Instructions (Signed)
Monilial Vaginitis Vaginitis in a soreness, swelling and redness (inflammation) of the vagina and vulva. Monilial vaginitis is not a sexually transmitted infection. CAUSES  Yeast vaginitis is caused by yeast (candida) that is normally found in your vagina. With a yeast infection, the candida has overgrown in number to a point that upsets the chemical balance. SYMPTOMS   White, thick vaginal discharge.  Swelling, itching, redness and irritation of the vagina and possibly the lips of the vagina (vulva).  Burning or painful urination.  Painful intercourse. DIAGNOSIS  Things that may contribute to monilial vaginitis are:  Postmenopausal and virginal states.  Pregnancy.  Infections.  Being tired, sick or stressed, especially if you had monilial vaginitis in the past.  Diabetes. Good control will help lower the chance.  Birth control pills.  Tight fitting garments.  Using bubble bath, feminine sprays, douches or deodorant tampons.  Taking certain medications that kill germs (antibiotics).  Sporadic recurrence can occur if you become ill. TREATMENT  Your caregiver will give you medication.  There are several kinds of anti monilial vaginal creams and suppositories specific for monilial vaginitis. For recurrent yeast infections, use a suppository or cream in the vagina 2 times a week, or as directed.  Anti-monilial or steroid cream for the itching or irritation of the vulva may also be used. Get your caregiver's permission.  Painting the vagina with methylene blue solution may help if the monilial cream does not work.  Eating yogurt may help prevent monilial vaginitis. HOME CARE INSTRUCTIONS   Finish all medication as prescribed.  Do not have sex until treatment is completed or after your caregiver tells you it is okay.  Take warm sitz baths.  Do not douche.  Do not use tampons, especially scented ones.  Wear cotton underwear.  Avoid tight pants and panty  hose.  Tell your sexual partner that you have a yeast infection. They should go to their caregiver if they have symptoms such as mild rash or itching.  Your sexual partner should be treated as well if your infection is difficult to eliminate.  Practice safer sex. Use condoms.  Some vaginal medications cause latex condoms to fail. Vaginal medications that harm condoms are:  Cleocin cream.  Butoconazole (Femstat).  Terconazole (Terazol) vaginal suppository.  Miconazole (Monistat) (may be purchased over the counter). SEEK MEDICAL CARE IF:   You have a temperature by mouth above 102 F (38.9 C).  The infection is getting worse after 2 days of treatment.  The infection is not getting better after 3 days of treatment.  You develop blisters in or around your vagina.  You develop vaginal bleeding, and it is not your menstrual period.  You have pain when you urinate.  You develop intestinal problems.  You have pain with sexual intercourse.   This information is not intended to replace advice given to you by your health care provider. Make sure you discuss any questions you have with your health care provider.   Document Released: 09/15/2005 Document Revised: 02/28/2012 Document Reviewed: 06/09/2015 Elsevier Interactive Patient Education 2016 [employer] Urinary Tract Infection Urinary tract infections (UTIs) can develop anywhere along your urinary tract. Your urinary tract is your body's drainage system for removing wastes and extra water. Your urinary tract includes two kidneys, two ureters, a bladder, and a urethra. Your kidneys are a pair of bean-shaped organs. Each kidney is about the size of your fist. They are located below your ribs, one on each side of your spine. CAUSES Infections are  caused by microbes, which are microscopic organisms, including fungi, viruses, and bacteria. These organisms are so small that they can only be seen through a microscope. Bacteria are  the microbes that most commonly cause UTIs. SYMPTOMS  Symptoms of UTIs may vary by age and gender of the patient and by the location of the infection. Symptoms in young women typically include a frequent and intense urge to urinate and a painful, burning feeling in the bladder or urethra during urination. Older women and men are more likely to be tired, shaky, and weak and have muscle aches and abdominal pain. A fever may mean the infection is in your kidneys. Other symptoms of a kidney infection include pain in your back or sides below the ribs, nausea, and vomiting. DIAGNOSIS To diagnose a UTI, your caregiver will ask you about your symptoms. Your caregiver will also ask you to provide a urine sample. The urine sample will be tested for bacteria and white blood cells. White blood cells are made by your body to help fight infection. TREATMENT  Typically, UTIs can be treated with medication. Because most UTIs are caused by a bacterial infection, they usually can be treated with the use of antibiotics. The choice of antibiotic and length of treatment depend on your symptoms and the type of bacteria causing your infection. HOME CARE INSTRUCTIONS  If you were prescribed antibiotics, take them exactly as your caregiver instructs you. Finish the medication even if you feel better after you have only taken some of the medication.  Drink enough water and fluids to keep your urine clear or pale yellow.  Avoid caffeine, tea, and carbonated beverages. They tend to irritate your bladder.  Empty your bladder often. Avoid holding urine for long periods of time.  Empty your bladder before and after sexual intercourse.  After a bowel movement, women should cleanse from front to back. Use each tissue only once. SEEK MEDICAL CARE IF:   You have back pain.  You develop a fever.  Your symptoms do not begin to resolve within 3 days. SEEK IMMEDIATE MEDICAL CARE IF:   You have severe back pain or lower  abdominal pain.  You develop chills.  You have nausea or vomiting.  You have continued burning or discomfort with urination. MAKE SURE YOU:   Understand these instructions.  Will watch your condition.  Will get help right away if you are not doing well or get worse.   This information is not intended to replace advice given to you by your health care provider. Make sure you discuss any questions you have with your health care provider.   Document Released: 09/15/2005 Document Revised: 08/27/2015 Document Reviewed: 01/14/2012 Elsevier Interactive Patient Education [employer].

## 2015-12-20 LAB — URINE CULTURE

## 2015-12-31 ENCOUNTER — Telehealth: Payer: Self-pay | Admitting: Family

## 2015-12-31 ENCOUNTER — Ambulatory Visit (INDEPENDENT_AMBULATORY_CARE_PROVIDER_SITE_OTHER): Payer: Medicare Other | Admitting: Family Medicine

## 2015-12-31 DIAGNOSIS — R300 Dysuria: Secondary | ICD-10-CM | POA: Diagnosis not present

## 2015-12-31 LAB — POCT UA - MICROSCOPIC ONLY
CRYSTALS, UR, HPF, POC: NEGATIVE
Casts, Ur, LPF, POC: NEGATIVE
Yeast, UA: NEGATIVE

## 2015-12-31 LAB — POCT URINALYSIS DIPSTICK
Bilirubin, UA: NEGATIVE
GLUCOSE UA: NEGATIVE
Ketones, UA: NEGATIVE
NITRITE UA: NEGATIVE
PROTEIN UA: NEGATIVE
Spec Grav, UA: 1.015
UROBILINOGEN UA: NEGATIVE
pH, UA: 5

## 2015-12-31 NOTE — Telephone Encounter (Signed)
Please call us to discuss options.  Ms. Sandra Adams will return tomorrow if you can wait and would like to schedule an appointment?

## 2015-12-31 NOTE — Telephone Encounter (Signed)
Pt given appt today with Dr.Miller at 2:15. 

## 2016-01-05 ENCOUNTER — Telehealth: Payer: Self-pay | Admitting: *Deleted

## 2016-01-05 DIAGNOSIS — R300 Dysuria: Secondary | ICD-10-CM

## 2016-01-05 MED ORDER — CEPHALEXIN 500 MG PO CAPS: 500.0000 mg | ORAL_CAPSULE | Freq: Three times a day (TID) | ORAL | Status: DC

## 2016-01-05 NOTE — Telephone Encounter (Signed)
Pt walked in today regarding urine sample from last week, pt had WBC's in urine, no culture performed and pt wasn't treated. Dr. Sabra Heck isn't in office today so spoke with Dr. Wendi Snipes and he advised get urine sample for culture and to send rx for Keflex 500 TID for 7 days.

## 2016-01-08 ENCOUNTER — Encounter: Payer: Self-pay | Admitting: Family Medicine

## 2016-01-08 NOTE — Progress Notes (Signed)
Orders were placed for dysuria complaint, pt was a no show for this appointment.

## 2016-01-15 DIAGNOSIS — N63 Unspecified lump in breast: Secondary | ICD-10-CM | POA: Diagnosis not present

## 2016-01-21 ENCOUNTER — Telehealth: Payer: Self-pay | Admitting: Family

## 2016-01-21 NOTE — Telephone Encounter (Signed)
Patient advised that she will need to be seen for evaluation.

## 2016-01-29 ENCOUNTER — Encounter: Payer: Self-pay | Admitting: *Deleted

## 2016-01-30 ENCOUNTER — Encounter: Payer: Self-pay | Admitting: Family Medicine

## 2016-02-07 ENCOUNTER — Other Ambulatory Visit: Payer: Self-pay | Admitting: Family

## 2016-02-07 DIAGNOSIS — B373 Candidiasis of vulva and vagina: Secondary | ICD-10-CM

## 2016-02-07 DIAGNOSIS — B3731 Acute candidiasis of vulva and vagina: Secondary | ICD-10-CM

## 2016-02-09 MED ORDER — TRAMADOL-ACETAMINOPHEN 37.5-325 MG PO TABS: ORAL_TABLET | ORAL | Status: DC

## 2016-02-09 MED ORDER — FLUCONAZOLE 150 MG PO TABS: 150.0000 mg | ORAL_TABLET | ORAL | Status: DC

## 2016-02-09 NOTE — Telephone Encounter (Signed)
Detailed message left for patient that rx is ready to be up.

## 2016-02-09 NOTE — Telephone Encounter (Signed)
RX ready for pick up 

## 2016-02-11 ENCOUNTER — Ambulatory Visit (INDEPENDENT_AMBULATORY_CARE_PROVIDER_SITE_OTHER): Payer: Medicare Other

## 2016-02-11 ENCOUNTER — Encounter: Payer: Self-pay | Admitting: Family Medicine

## 2016-02-11 ENCOUNTER — Ambulatory Visit (INDEPENDENT_AMBULATORY_CARE_PROVIDER_SITE_OTHER): Payer: Medicare Other | Admitting: Family Medicine

## 2016-02-11 VITALS — BP 115/65 | HR 73 | Temp 97.0°F | Ht 61.5 in | Wt 137.4 lb

## 2016-02-11 DIAGNOSIS — M5442 Lumbago with sciatica, left side: Secondary | ICD-10-CM | POA: Diagnosis not present

## 2016-02-11 MED ORDER — IBUPROFEN 600 MG PO TABS: 600.0000 mg | ORAL_TABLET | Freq: Three times a day (TID) | ORAL | Status: DC | PRN

## 2016-02-11 NOTE — Progress Notes (Signed)
BP 115/65 mmHg  Pulse 73  Temp(Src) 97 F (36.1 C) (Oral)  Ht 5' 1.5" (1.562 m)  Wt 137 lb 6.4 oz (62.324 kg)  BMI 25.54 kg/m2  LMP 10/16/2014   Subjective:    Patient ID: Sandra Adams, female    DOB: [birth year], 50 y.o.   MRN: [id number]  HPI: Sandra Adams is a 50 y.o. female presenting on 02/11/2016 for Low back pain, radiating into left hip and into leg   HPI  Back pain Left lumbar back pain with pain shooting down her left posterior leg. this pain is been going on for years and patient feels like it is from when she has had scoliosis when she was younger. The radicular pains happen occasionally. She has not noticed any changes over the past couple years but it is just been getting more prominent. She says she has not had any x-rays for at least 10 years. She denies any numbness or weakness or any loss of bowel or bladder. The pain is 8 out of 10   Relevant past medical, surgical, family and social history reviewed and updated as indicated. Interim medical history since our last visit reviewed. Allergies and medications reviewed and updated.  Review of Systems  Constitutional: Negative for fever and chills.  HENT: Negative for congestion, ear discharge and ear pain.   Eyes: Negative for redness and visual disturbance.  Respiratory: Negative for chest tightness and shortness of breath.   Cardiovascular: Negative for chest pain and leg swelling.  Genitourinary: Negative for dysuria and difficulty urinating.  Musculoskeletal: Positive for myalgias, back pain and arthralgias. Negative for joint swelling and gait problem.  Skin: Negative for rash.  Neurological: Negative for dizziness, light-headedness and headaches.  Psychiatric/Behavioral: Negative for behavioral problems and agitation.  All other systems reviewed and are negative.   Per HPI unless specifically indicated above     Medication List       This list is accurate as of: 02/11/16 10:26 AM.  Always use  your most recent med list.               ALPRAZolam 0.25 MG tablet  Commonly known as:  XANAX  TAKE 1 TABLET AT BEDTIME AS NEEDED FOR ANXIETY     cloNIDine 0.1 MG tablet  Commonly known as:  CATAPRES  One po qhs prn hot flashes     DULoxetine 30 MG capsule  Commonly known as:  CYMBALTA  Take 1 capsule (30 mg total) by mouth daily.     ibuprofen 600 MG tablet  Commonly known as:  ADVIL,MOTRIN  Take 1 tablet (600 mg total) by mouth every 8 (eight) hours as needed for mild pain or moderate pain.     omeprazole 20 MG capsule  Commonly known as:  PRILOSEC  Take 1 capsule (20 mg total) by mouth daily.     traMADol-acetaminophen 37.5-325 MG tablet  Commonly known as:  ULTRACET  TAKE 2 TABLETS BY MOUTH EVERY 6 TO 8 HOURS AS NEEDED MAX 8/24 HRS           Objective:    BP 115/65 mmHg  Pulse 73  Temp(Src) 97 F (36.1 C) (Oral)  Ht 5' 1.5" (1.562 m)  Wt 137 lb 6.4 oz (62.324 kg)  BMI 25.54 kg/m2  LMP 10/16/2014  Wt Readings from Last 3 Encounters:  02/11/16 137 lb 6.4 oz (62.324 kg)  12/18/15 131 lb 9.6 oz (59.693 kg)  11/18/15 133 lb (60.328 kg)    Physical  Exam  Constitutional: She is oriented to person, place, and time. She appears well-developed and well-nourished. No distress.  Eyes: Conjunctivae and EOM are normal. Pupils are equal, round, and reactive to light.  Neck: Neck supple. No thyromegaly present.  Cardiovascular: Normal rate, regular rhythm, normal heart sounds and intact distal pulses.   No murmur heard. Pulmonary/Chest: Effort normal and breath sounds normal. No respiratory distress. She has no wheezes.  Musculoskeletal: Normal range of motion. She exhibits no edema or tenderness.       Lumbar back: She exhibits normal range of motion, no tenderness (Unable to elicit tenderness upon palpation, more pain with flexion of the back), no bony tenderness (Negative straight leg raise), no swelling and no deformity.  Lymphadenopathy:    She has no cervical  adenopathy.  Neurological: She is alert and oriented to person, place, and time. Coordination normal.  Skin: Skin is warm and dry. No rash noted. She is not diaphoretic.  Psychiatric: She has a normal mood and affect. Her behavior is normal.  Nursing note and vitals reviewed.       Assessment & Plan:   Problem List Items Addressed This Visit    None    Visit Diagnoses    Left-sided low back pain with left-sided sciatica    -  Primary    Likely sciatic nerve pain and piriformis syndrome. will do XR of L-spine.    Relevant Medications    ibuprofen (ADVIL,MOTRIN) 600 MG tablet    Other Relevant Orders    DG Lumbar Spine 2-3 Views        Follow up plan: Return if symptoms worsen or fail to improve.  Counseling provided for all of the vaccine components Orders Placed This Encounter  Procedures  . DG Lumbar Spine 2-3 Views    Caryl Pina, MD [facility] 02/11/2016, 10:26 AM

## 2016-03-11 ENCOUNTER — Ambulatory Visit (INDEPENDENT_AMBULATORY_CARE_PROVIDER_SITE_OTHER): Payer: Medicare Other | Admitting: Family

## 2016-03-11 ENCOUNTER — Encounter: Payer: Self-pay | Admitting: Family

## 2016-03-11 VITALS — BP 98/59 | HR 73 | Temp 97.6°F | Ht 61.5 in | Wt 136.0 lb

## 2016-03-11 DIAGNOSIS — F329 Major depressive disorder, single episode, unspecified: Secondary | ICD-10-CM | POA: Diagnosis not present

## 2016-03-11 DIAGNOSIS — K219 Gastro-esophageal reflux disease without esophagitis: Secondary | ICD-10-CM | POA: Diagnosis not present

## 2016-03-11 DIAGNOSIS — Z01419 Encounter for gynecological examination (general) (routine) without abnormal findings: Secondary | ICD-10-CM | POA: Diagnosis not present

## 2016-03-11 DIAGNOSIS — N951 Menopausal and female climacteric states: Secondary | ICD-10-CM | POA: Diagnosis not present

## 2016-03-11 DIAGNOSIS — K047 Periapical abscess without sinus: Secondary | ICD-10-CM

## 2016-03-11 DIAGNOSIS — M545 Low back pain, unspecified: Secondary | ICD-10-CM

## 2016-03-11 DIAGNOSIS — F411 Generalized anxiety disorder: Secondary | ICD-10-CM

## 2016-03-11 DIAGNOSIS — R5383 Other fatigue: Secondary | ICD-10-CM

## 2016-03-11 DIAGNOSIS — Z0000 Encounter for general adult medical examination without abnormal findings: Secondary | ICD-10-CM

## 2016-03-11 DIAGNOSIS — Z136 Encounter for screening for cardiovascular disorders: Secondary | ICD-10-CM | POA: Diagnosis not present

## 2016-03-11 DIAGNOSIS — Z1211 Encounter for screening for malignant neoplasm of colon: Secondary | ICD-10-CM

## 2016-03-11 DIAGNOSIS — F32A Depression, unspecified: Secondary | ICD-10-CM

## 2016-03-11 DIAGNOSIS — J309 Allergic rhinitis, unspecified: Secondary | ICD-10-CM

## 2016-03-11 MED ORDER — AMOXICILLIN 875 MG PO TABS: 875.0000 mg | ORAL_TABLET | Freq: Two times a day (BID) | ORAL | Status: DC

## 2016-03-11 MED ORDER — FLUTICASONE PROPIONATE 50 MCG/ACT NA SUSP: 2.0000 | Freq: Every day | NASAL | Status: DC

## 2016-03-11 MED ORDER — TRAMADOL-ACETAMINOPHEN 37.5-325 MG PO TABS: ORAL_TABLET | ORAL | Status: DC

## 2016-03-11 NOTE — Patient Instructions (Signed)
Health Maintenance, Female Adopting a healthy lifestyle and getting preventive care can go a long way to promote health and wellness. Talk with your health care provider about what schedule of regular examinations is right for you. This is a good chance for you to check in with your provider about disease prevention and staying healthy. In between checkups, there are plenty of things you can do on your own. Experts have done a lot of research about which lifestyle changes and preventive measures are most likely to keep you healthy. Ask your health care provider for more information. WEIGHT AND DIET  Eat a healthy diet  Be sure to include plenty of vegetables, fruits, low-fat dairy products, and lean protein.  Do not eat a lot of foods high in solid fats, added sugars, or salt.  Get regular exercise. This is one of the most important things you can do for your health.  Most adults should exercise for at least 150 minutes each week. The exercise should increase your heart rate and make you sweat (moderate-intensity exercise).  Most adults should also do strengthening exercises at least twice a week. This is in addition to the moderate-intensity exercise.  Maintain a healthy weight  Body mass index (BMI) is a measurement that can be used to identify possible weight problems. It estimates body fat based on height and weight. Your health care provider can help determine your BMI and help you achieve or maintain a healthy weight.  For females 20 years of age and older:   A BMI below 18.5 is considered underweight.  A BMI of 18.5 to 24.9 is normal.  A BMI of 25 to 29.9 is considered overweight.  A BMI of 30 and above is considered obese.  Watch levels of cholesterol and blood lipids  You should start having your blood tested for lipids and cholesterol at 52 years of age, then have this test every 5 years.  You may need to have your cholesterol levels checked more often if:  Your lipid  or cholesterol levels are high.  You are older than 52 years of age.  You are at high risk for heart disease.  CANCER SCREENING   Lung Cancer  Lung cancer screening is recommended for adults 55-80 years old who are at high risk for lung cancer because of a history of smoking.  A yearly low-dose CT scan of the lungs is recommended for people who:  Currently smoke.  Have quit within the past 15 years.  Have at least a 30-pack-year history of smoking. A pack year is smoking an average of one pack of cigarettes a day for 1 year.  Yearly screening should continue until it has been 15 years since you quit.  Yearly screening should stop if you develop a health problem that would prevent you from having lung cancer treatment.  Breast Cancer  Practice breast self-awareness. This means understanding how your breasts normally appear and feel.  It also means doing regular breast self-exams. Let your health care provider know about any changes, no matter how small.  If you are in your 20s or 30s, you should have a clinical breast exam (CBE) by a health care provider every 1-3 years as part of a regular health exam.  If you are 40 or older, have a CBE every year. Also consider having a breast X-ray (mammogram) every year.  If you have a family history of breast cancer, talk to your health care provider about genetic screening.  If you   are at high risk for breast cancer, talk to your health care provider about having an MRI and a mammogram every year.  Breast cancer gene (BRCA) assessment is recommended for women who have family members with BRCA-related cancers. BRCA-related cancers include:  Breast.  Ovarian.  Tubal.  Peritoneal cancers.  Results of the assessment will determine the need for genetic counseling and BRCA1 and BRCA2 testing. Cervical Cancer Your health care provider may recommend that you be screened regularly for cancer of the pelvic organs (ovaries, uterus, and  vagina). This screening involves a pelvic examination, including checking for microscopic changes to the surface of your cervix (Pap test). You may be encouraged to have this screening done every 3 years, beginning at age 21.  For women ages 30-65, health care providers may recommend pelvic exams and Pap testing every 3 years, or they may recommend the Pap and pelvic exam, combined with testing for human papilloma virus (HPV), every 5 years. Some types of HPV increase your risk of cervical cancer. Testing for HPV may also be done on women of any age with unclear Pap test results.  Other health care providers may not recommend any screening for nonpregnant women who are considered low risk for pelvic cancer and who do not have symptoms. Ask your health care provider if a screening pelvic exam is right for you.  If you have had past treatment for cervical cancer or a condition that could lead to cancer, you need Pap tests and screening for cancer for at least 20 years after your treatment. If Pap tests have been discontinued, your risk factors (such as having a new sexual partner) need to be reassessed to determine if screening should resume. Some women have medical problems that increase the chance of getting cervical cancer. In these cases, your health care provider may recommend more frequent screening and Pap tests. Colorectal Cancer  This type of cancer can be detected and often prevented.  Routine colorectal cancer screening usually begins at 52 years of age and continues through 52 years of age.  Your health care provider may recommend screening at an earlier age if you have risk factors for colon cancer.  Your health care provider may also recommend using home test kits to check for hidden blood in the stool.  A small camera at the end of a tube can be used to examine your colon directly (sigmoidoscopy or colonoscopy). This is done to check for the earliest forms of colorectal  cancer.  Routine screening usually begins at age 50.  Direct examination of the colon should be repeated every 5-10 years through 52 years of age. However, you may need to be screened more often if early forms of precancerous polyps or small growths are found. Skin Cancer  Check your skin from head to toe regularly.  Tell your health care provider about any new moles or changes in moles, especially if there is a change in a mole's shape or color.  Also tell your health care provider if you have a mole that is larger than the size of a pencil eraser.  Always use sunscreen. Apply sunscreen liberally and repeatedly throughout the day.  Protect yourself by wearing long sleeves, pants, a wide-brimmed hat, and sunglasses whenever you are outside. HEART DISEASE, DIABETES, AND HIGH BLOOD PRESSURE   High blood pressure causes heart disease and increases the risk of stroke. High blood pressure is more likely to develop in:  People who have blood pressure in the high end   of the normal range (130-139/85-89 mm Hg).  People who are overweight or obese.  People who are African American.  If you are 38-23 years of age, have your blood pressure checked every 3-5 years. If you are 61 years of age or older, have your blood pressure checked every year. You should have your blood pressure measured twice--once when you are at a hospital or clinic, and once when you are not at a hospital or clinic. Record the average of the two measurements. To check your blood pressure when you are not at a hospital or clinic, you can use:  An automated blood pressure machine at a pharmacy.  A home blood pressure monitor.  If you are between 45 years and 39 years old, ask your health care provider if you should take aspirin to prevent strokes.  Have regular diabetes screenings. This involves taking a blood sample to check your fasting blood sugar level.  If you are at a normal weight and have a low risk for diabetes,  have this test once every three years after 52 years of age.  If you are overweight and have a high risk for diabetes, consider being tested at a younger age or more often. PREVENTING INFECTION  Hepatitis B  If you have a higher risk for hepatitis B, you should be screened for this virus. You are considered at high risk for hepatitis B if:  You were born in a country where hepatitis B is common. Ask your health care provider which countries are considered high risk.  Your parents were born in a high-risk country, and you have not been immunized against hepatitis B (hepatitis B vaccine).  You have HIV or AIDS.  You use needles to inject street drugs.  You live with someone who has hepatitis B.  You have had sex with someone who has hepatitis B.  You get hemodialysis treatment.  You take certain medicines for conditions, including cancer, organ transplantation, and autoimmune conditions. Hepatitis C  Blood testing is recommended for:  Everyone born from [birth year] through 1967.  Anyone with known risk factors for hepatitis C. Sexually transmitted infections (STIs)  You should be screened for sexually transmitted infections (STIs) including gonorrhea and chlamydia if:  You are sexually active and are younger than 52 years of age.  You are older than 53 years of age and your health care provider tells you that you are at risk for this type of infection.  Your sexual activity has changed since you were last screened and you are at an increased risk for chlamydia or gonorrhea. Ask your health care provider if you are at risk.  If you do not have HIV, but are at risk, it may be recommended that you take a prescription medicine daily to prevent HIV infection. This is called pre-exposure prophylaxis (PrEP). You are considered at risk if:  You are sexually active and do not regularly use condoms or know the HIV status of your partner(s).  You take drugs by injection.  You are sexually  active with a partner who has HIV. Talk with your health care provider about whether you are at high risk of being infected with HIV. If you choose to begin PrEP, you should first be tested for HIV. You should then be tested every 3 months for as long as you are taking PrEP.  PREGNANCY   If you are premenopausal and you may become pregnant, ask your health care provider about preconception counseling.  If you may  become pregnant, take 400 to 800 micrograms (mcg) of folic acid every day.  If you want to prevent pregnancy, talk to your health care provider about birth control (contraception). OSTEOPOROSIS AND MENOPAUSE   Osteoporosis is a disease in which the bones lose minerals and strength with aging. This can result in serious bone fractures. Your risk for osteoporosis can be identified using a bone density scan.  If you are 61 years of age or older, or if you are at risk for osteoporosis and fractures, ask your health care provider if you should be screened.  Ask your health care provider whether you should take a calcium or vitamin D supplement to lower your risk for osteoporosis.  Menopause may have certain physical symptoms and risks.  Hormone replacement therapy may reduce some of these symptoms and risks. Talk to your health care provider about whether hormone replacement therapy is right for you.  HOME CARE INSTRUCTIONS   Schedule regular health, dental, and eye exams.  Stay current with your immunizations.   Do not use any tobacco products including cigarettes, chewing tobacco, or electronic cigarettes.  If you are pregnant, do not drink alcohol.  If you are breastfeeding, limit how much and how often you drink alcohol.  Limit alcohol intake to no more than 1 drink per day for nonpregnant women. One drink equals 12 ounces of beer, 5 ounces of wine, or 1 ounces of hard liquor.  Do not use street drugs.  Do not share needles.  Ask your health care provider for help if  you need support or information about quitting drugs.  Tell your health care provider if you often feel depressed.  Tell your health care provider if you have ever been abused or do not feel safe at home.   This information is not intended to replace advice given to you by your health care provider. Make sure you discuss any questions you have with your health care provider.   Document Released: 06/21/2011 Document Revised: 12/27/2014 Document Reviewed: 11/07/2013 Elsevier Interactive Patient Education [employer].

## 2016-03-11 NOTE — Progress Notes (Signed)
Subjective:    Patient ID: Sandra Adams, female    DOB: 1966/01/24, 50 y.o.   MRN: [id number]  Pt presents to the office today for CPE with pap.  Gynecologic Exam Pertinent negatives include no headaches or sore throat.  Anxiety Presents for follow-up visit. Onset was 1 to 5 years ago. The problem has been waxing and waning. Symptoms include depressed mood, excessive worry, feeling of choking, irritability, nervous/anxious behavior and restlessness. Patient reports no palpitations, panic, shortness of breath or suicidal ideas. Symptoms occur occasionally. The quality of sleep is poor.   Her past medical history is significant for anxiety/panic attacks and depression. Past treatments include SSRIs and benzodiazephines. Compliance with prior treatments has been variable.  Depression      The patient presents with depression.  This is a chronic problem.  The current episode started more than 1 year ago.   The onset quality is gradual.   The problem occurs intermittently.  The problem has been waxing and waning since onset.  Associated symptoms include hopelessness, restlessness, myalgias and sad.  Associated symptoms include no headaches and no suicidal ideas.  Past treatments include SNRIs - Serotonin and norepinephrine reuptake inhibitors.  Compliance with treatment is variable.  Past medical history includes anxiety and depression.   Gastroesophageal Reflux She complains of heartburn. She reports no dysphagia, no sore throat or no wheezing. This is a chronic problem. The current episode started more than 1 year ago. The problem occurs frequently. The problem has been waxing and waning. The heartburn wakes her from sleep. The symptoms are aggravated by certain foods and lying down. She has tried a diet change for the symptoms. The treatment provided no relief.      Review of Systems  Constitutional: Positive for irritability.  HENT: Negative.  Negative for sore throat.   Eyes: Negative.     Respiratory: Negative.  Negative for shortness of breath and wheezing.   Cardiovascular: Negative.  Negative for palpitations.  Gastrointestinal: Positive for heartburn. Negative for dysphagia.  Endocrine: Negative.   Genitourinary: Negative.   Musculoskeletal: Positive for myalgias.  Neurological: Negative.  Negative for headaches.  Hematological: Negative.   Psychiatric/Behavioral: Positive for depression. Negative for suicidal ideas. The patient is nervous/anxious.   All other systems reviewed and are negative.      Objective:   Physical Exam  Constitutional: She is oriented to person, place, and time. She appears well-developed and well-nourished. No distress.  HENT:  Head: Normocephalic and atraumatic.  Right Ear: External ear normal.  Left Ear: External ear normal.  Nose: Nose normal.  Mouth/Throat: Oropharynx is clear and moist.  Eyes: Pupils are equal, round, and reactive to light.  Neck: Normal range of motion. Neck supple. No thyromegaly present.  Cardiovascular: Normal rate, regular rhythm, normal heart sounds and intact distal pulses.   No murmur heard. Pulmonary/Chest: Effort normal and breath sounds normal. No respiratory distress. She has no wheezes. Right breast exhibits no inverted nipple, no mass, no nipple discharge, no skin change and no tenderness. Left breast exhibits no inverted nipple, no mass, no nipple discharge, no skin change and no tenderness. Breasts are symmetrical.  Abdominal: Soft. Bowel sounds are normal. She exhibits no distension. There is no tenderness.  Genitourinary: Vagina normal.  Bimanual exam- no adnexal masses or tenderness, ovaries nonpalpable   Cervix parous and pink- Yellow thick discharge present  Musculoskeletal: Normal range of motion. She exhibits no edema or tenderness.     Neurological: She is alert and  oriented to person, place, and time. She has normal reflexes. No cranial nerve deficit.  Skin: Skin is warm and dry.   Psychiatric: She has a normal mood and affect. Her behavior is normal. Judgment and thought content normal.  Vitals reviewed.    BP 98/59 mmHg  Pulse 73  Temp(Src) 97.6 F (36.4 C) (Oral)  Ht 5' 1.5" (1.562 m)  Wt 136 lb (61.689 kg)  BMI 25.28 kg/m2  LMP 10/16/2014      Assessment & Plan:  1. Encounter for routine gynecological examination - Urinalysis, Complete - CMP14+EGFR - Pap IG w/ reflex to HPV when ASC-U  2. GAD (generalized anxiety disorder) - CMP14+EGFR  3. Depression - CMP14+EGFR  4. Gastroesophageal reflux disease without esophagitis - CMP14+EGFR  5. Menopausal hot flushes - CMP14+EGFR  6. Annual physical exam - CMP14+EGFR - Lipid panel - Thyroid Panel With TSH - VITAMIN D 25 Hydroxy (Vit-D Deficiency, Fractures) - Pap IG w/ reflex to HPV when ASC-U - Anemia Profile B  7. Colon cancer screening - Ambulatory referral to Gastroenterology  8. Other fatigue - Anemia Profile B   Continue all meds Labs pending Health Maintenance reviewed Diet and exercise encouraged RTO 3 months  [name], FNP

## 2016-03-12 LAB — CMP14+EGFR
A/G RATIO: 1.7 (ref 1.2–2.2)
ALK PHOS: 104 IU/L (ref 39–117)
ALT: 37 IU/L — AB (ref 0–32)
AST: 23 IU/L (ref 0–40)
Albumin: 4.5 g/dL (ref 3.5–5.5)
BUN/Creatinine Ratio: 10 (ref 9–23)
BUN: 8 mg/dL (ref 6–24)
Bilirubin Total: 0.4 mg/dL (ref 0.0–1.2)
CALCIUM: 9.6 mg/dL (ref 8.7–10.2)
CO2: 25 mmol/L (ref 18–29)
CREATININE: 0.78 mg/dL (ref 0.57–1.00)
Chloride: 102 mmol/L (ref 96–106)
GFR calc Af Amer: 102 mL/min/{1.73_m2} (ref >59)
GFR, EST NON AFRICAN AMERICAN: 88 mL/min/{1.73_m2} (ref >59)
Globulin, Total: 2.7 g/dL (ref 1.5–4.5)
Glucose: 96 mg/dL (ref 65–99)
POTASSIUM: 4.6 mmol/L (ref 3.5–5.2)
Sodium: 142 mmol/L (ref 134–144)
Total Protein: 7.2 g/dL (ref 6.0–8.5)

## 2016-03-12 LAB — ANEMIA PROFILE B
BASOS: 1 %
Basophils Absolute: 0.1 10*3/uL (ref 0.0–0.2)
EOS (ABSOLUTE): 0.2 10*3/uL (ref 0.0–0.4)
Eos: 2 %
FERRITIN: 81 ng/mL (ref 15–150)
Folate: 12.5 ng/mL (ref >3.0)
HEMATOCRIT: 40.7 % (ref 34.0–46.6)
Hemoglobin: 13.7 g/dL (ref 11.1–15.9)
IMMATURE GRANS (ABS): 0 10*3/uL (ref 0.0–0.1)
Immature Granulocytes: 0 %
Iron Saturation: 29 % (ref 15–55)
Iron: 93 ug/dL (ref 27–159)
LYMPHS: 37 %
Lymphocytes Absolute: 3.4 10*3/uL — ABNORMAL HIGH (ref 0.7–3.1)
MCH: 32.5 pg (ref 26.6–33.0)
MCHC: 33.7 g/dL (ref 31.5–35.7)
MCV: 97 fL (ref 79–97)
MONOS ABS: 0.7 10*3/uL (ref 0.1–0.9)
Monocytes: 7 %
NEUTROS ABS: 4.9 10*3/uL (ref 1.4–7.0)
Neutrophils: 53 %
Platelets: 234 10*3/uL (ref 150–379)
RBC: 4.21 x10E6/uL (ref 3.77–5.28)
RDW: 13.6 % (ref 12.3–15.4)
Retic Ct Pct: 1.8 % (ref 0.6–2.6)
Total Iron Binding Capacity: 321 ug/dL (ref 250–450)
UIBC: 228 ug/dL (ref 131–425)
VITAMIN B 12: 546 pg/mL (ref 211–946)
WBC: 9.2 10*3/uL (ref 3.4–10.8)

## 2016-03-12 LAB — LIPID PANEL
CHOL/HDL RATIO: 3 ratio (ref 0.0–4.4)
CHOLESTEROL TOTAL: 164 mg/dL (ref 100–199)
HDL: 55 mg/dL (ref >39)
LDL Calculated: 89 mg/dL (ref 0–99)
TRIGLYCERIDES: 101 mg/dL (ref 0–149)
VLDL Cholesterol Cal: 20 mg/dL (ref 5–40)

## 2016-03-12 LAB — VITAMIN D 25 HYDROXY (VIT D DEFICIENCY, FRACTURES): VIT D 25 HYDROXY: 18.3 ng/mL — AB (ref 30.0–100.0)

## 2016-03-12 LAB — THYROID PANEL WITH TSH
Free Thyroxine Index: 1.9 (ref 1.2–4.9)
T3 Uptake Ratio: 20 % — ABNORMAL LOW (ref 24–39)
T4 TOTAL: 9.6 ug/dL (ref 4.5–12.0)
TSH: 1.39 u[IU]/mL (ref 0.450–4.500)

## 2016-03-15 LAB — PAP IG W/ RFLX HPV ASCU: PAP Smear Comment: 0

## 2016-03-31 LAB — URINALYSIS, COMPLETE
BILIRUBIN UA: NEGATIVE
Glucose, UA: NEGATIVE
KETONES UA: NEGATIVE
Leukocytes, UA: NEGATIVE
Nitrite, UA: NEGATIVE
Protein, UA: NEGATIVE
SPEC GRAV UA: 1.025 (ref 1.005–1.030)
UUROB: 0.2 mg/dL (ref 0.2–1.0)
pH, UA: 6 (ref 5.0–7.5)

## 2016-03-31 LAB — MICROSCOPIC EXAMINATION: Epithelial Cells (non renal): >10 /hpf — AB (ref 0–10)

## 2016-04-12 DIAGNOSIS — S0501XA Injury of conjunctiva and corneal abrasion without foreign body, right eye, initial encounter: Secondary | ICD-10-CM | POA: Diagnosis not present

## 2016-04-12 DIAGNOSIS — H578 Other specified disorders of eye and adnexa: Secondary | ICD-10-CM | POA: Diagnosis not present

## 2016-04-12 DIAGNOSIS — T1501XA Foreign body in cornea, right eye, initial encounter: Secondary | ICD-10-CM | POA: Diagnosis not present

## 2016-04-13 DIAGNOSIS — H1013 Acute atopic conjunctivitis, bilateral: Secondary | ICD-10-CM | POA: Diagnosis not present

## 2016-04-23 ENCOUNTER — Encounter (INDEPENDENT_AMBULATORY_CARE_PROVIDER_SITE_OTHER): Payer: Self-pay

## 2016-04-30 DIAGNOSIS — M25562 Pain in left knee: Secondary | ICD-10-CM | POA: Diagnosis not present

## 2016-04-30 DIAGNOSIS — M25462 Effusion, left knee: Secondary | ICD-10-CM | POA: Diagnosis not present

## 2016-05-05 ENCOUNTER — Encounter (INDEPENDENT_AMBULATORY_CARE_PROVIDER_SITE_OTHER): Payer: Self-pay

## 2016-05-05 ENCOUNTER — Ambulatory Visit (INDEPENDENT_AMBULATORY_CARE_PROVIDER_SITE_OTHER): Payer: Medicare Other | Admitting: Family Medicine

## 2016-05-05 ENCOUNTER — Encounter: Payer: Self-pay | Admitting: Family Medicine

## 2016-05-05 VITALS — BP 121/68 | HR 84 | Temp 98.1°F | Ht 61.5 in | Wt 136.0 lb

## 2016-05-05 DIAGNOSIS — S8002XA Contusion of left knee, initial encounter: Secondary | ICD-10-CM | POA: Diagnosis not present

## 2016-05-05 DIAGNOSIS — M25462 Effusion, left knee: Secondary | ICD-10-CM

## 2016-05-05 MED ORDER — DICLOFENAC SODIUM 1 % TD GEL: 2.0000 g | Freq: Four times a day (QID) | TRANSDERMAL | Status: DC

## 2016-05-05 MED ORDER — MELOXICAM 15 MG PO TABS: 15.0000 mg | ORAL_TABLET | Freq: Every day | ORAL | Status: DC

## 2016-05-05 NOTE — Progress Notes (Signed)
BP 121/68 mmHg  Pulse 84  Temp(Src) 98.1 F (36.7 C) (Oral)  Ht 5' 1.5" (1.562 m)  Wt 136 lb (61.689 kg)  BMI 25.28 kg/m2  LMP 10/16/2014   Subjective:    Patient ID: Sandra Adams, female    DOB: [birth year], 50 y.o.   MRN: [id number]  HPI: Sandra Adams is a 50 y.o. female presenting on 05/05/2016 for Knee Pain   HPI Knee pain Patient fell 4 days ago and twisted her knee and her house when she was turning to go in one direction. When she twisted her knee she went down and fell on her backside. Since that time she has had significant swelling and pain in that knee. She had an x-ray done at urgent care which came back with no bony abnormalities. She's been taking ipratropium 600 mg 3 times daily and feels like it is helping decrease the swelling some. She is also been icing the knee and has been helping some as well. She denies any fevers or chills. She does have some overlying swelling. The majority of the pain is located on the medial aspect of that left knee. She does not feel like the knee gives out on her but because of the pain and swelling she says it just feels funny when she walks on it and has a lot of pain in it. She has bruising overlying the medial aspect of the knee as well  Relevant past medical, surgical, family and social history reviewed and updated as indicated. Interim medical history since our last visit reviewed. Allergies and medications reviewed and updated.  Review of Systems  Constitutional: Negative for fever and chills.  HENT: Negative for congestion, ear discharge and ear pain.   Eyes: Negative for redness and visual disturbance.  Respiratory: Negative for chest tightness and shortness of breath.   Cardiovascular: Negative for chest pain and leg swelling.  Genitourinary: Negative for dysuria and difficulty urinating.  Musculoskeletal: Positive for joint swelling and arthralgias. Negative for back pain and gait problem.  Skin: Positive for color  change. Negative for rash.  Neurological: Negative for light-headedness and headaches.  Psychiatric/Behavioral: Negative for behavioral problems and agitation.  All other systems reviewed and are negative.   Per HPI unless specifically indicated above     Medication List       This list is accurate as of: 05/05/16 11:07 AM.  Always use your most recent med list.               ALPRAZolam 0.25 MG tablet  Commonly known as:  XANAX  TAKE 1 TABLET AT BEDTIME AS NEEDED FOR ANXIETY     diclofenac sodium 1 % Gel  Commonly known as:  VOLTAREN  Apply 2 g topically 4 (four) times daily.     DULoxetine 30 MG capsule  Commonly known as:  CYMBALTA  Take 1 capsule (30 mg total) by mouth daily.     fluticasone 50 MCG/ACT nasal spray  Commonly known as:  FLONASE  Place 2 sprays into both nostrils daily.     ibuprofen 600 MG tablet  Commonly known as:  ADVIL,MOTRIN  Take 1 tablet (600 mg total) by mouth every 8 (eight) hours as needed for mild pain or moderate pain.     meloxicam 15 MG tablet  Commonly known as:  MOBIC  Take 1 tablet (15 mg total) by mouth daily.     omeprazole 20 MG capsule  Commonly known as:  PRILOSEC  Take 1  capsule (20 mg total) by mouth daily.     traMADol-acetaminophen 37.5-325 MG tablet  Commonly known as:  ULTRACET  TAKE 2 TABLETS BY MOUTH EVERY 6 TO 8 HOURS AS NEEDED MAX 8/24 HRS           Objective:    BP 121/68 mmHg  Pulse 84  Temp(Src) 98.1 F (36.7 C) (Oral)  Ht 5' 1.5" (1.562 m)  Wt 136 lb (61.689 kg)  BMI 25.28 kg/m2  LMP 10/16/2014  Wt Readings from Last 3 Encounters:  05/05/16 136 lb (61.689 kg)  03/11/16 136 lb (61.689 kg)  02/11/16 137 lb 6.4 oz (62.324 kg)    Physical Exam  Constitutional: She is oriented to person, place, and time. She appears well-developed and well-nourished. No distress.  Eyes: Conjunctivae and EOM are normal. Pupils are equal, round, and reactive to light.  Neck: Neck supple. No thyromegaly present.    Cardiovascular: Normal rate, regular rhythm, normal heart sounds and intact distal pulses.   No murmur heard. Pulmonary/Chest: Effort normal and breath sounds normal. No respiratory distress. She has no wheezes.  Musculoskeletal: She exhibits no edema.       Left knee: She exhibits decreased range of motion (Limited range of motion, cannot do full extension or flexion because of significant effusion currently), swelling, effusion and ecchymosis. She exhibits no deformity, no laceration, no erythema, normal alignment, no LCL laxity, normal patellar mobility, normal meniscus and no MCL laxity. Tenderness found. Medial joint line tenderness noted.  Lymphadenopathy:    She has no cervical adenopathy.  Neurological: She is alert and oriented to person, place, and time. Coordination normal.  Skin: Skin is warm and dry. No rash noted. She is not diaphoretic.  Psychiatric: She has a normal mood and affect. Her behavior is normal.  Nursing note and vitals reviewed.    Urgent care x-ray, report shows no signs of acute bony abnormalities.    Assessment & Plan:       Problem List Items Addressed This Visit    None    Visit Diagnoses    Knee effusion, left    -  Primary    Had x-ray at urgent care and it was normal. We'll send anti-inflammatories to decrease effusion. Patient declined aspiration/injection today    Relevant Medications    meloxicam (MOBIC) 15 MG tablet    diclofenac sodium (VOLTAREN) 1 % GEL    Knee contusion, left, initial encounter        Relevant Medications    meloxicam (MOBIC) 15 MG tablet    diclofenac sodium (VOLTAREN) 1 % GEL        Follow up plan: Return if symptoms worsen or fail to improve.  Counseling provided for all of the vaccine components No orders of the defined types were placed in this encounter.    Caryl Pina, MD [facility] 05/05/2016, 11:07 AM

## 2016-05-06 ENCOUNTER — Telehealth: Payer: Self-pay

## 2016-05-06 NOTE — Telephone Encounter (Signed)
Insurance prior authorized Diclofenac

## 2016-05-12 ENCOUNTER — Other Ambulatory Visit: Payer: Self-pay | Admitting: Family

## 2016-05-12 ENCOUNTER — Telehealth: Payer: Self-pay | Admitting: Family Medicine

## 2016-05-12 MED ORDER — DICLOFENAC SODIUM 50 MG PO TBEC: 50.0000 mg | DELAYED_RELEASE_TABLET | Freq: Three times a day (TID) | ORAL | Status: DC

## 2016-05-12 NOTE — Telephone Encounter (Signed)
We can try diclofenac 50 mg 3 times a day when necessary, take with food. Any anti-inflammatory can cause the same symptoms in the stomach. She may need to take Zantac or something else for acid suppression while she is on these medications. She can try the diclofenac first and see how it goes 4.

## 2016-05-12 NOTE — Telephone Encounter (Signed)
Spoke with pt and advised of MD feedback, pt voiced understanding and rx sent to pharmacy.

## 2016-05-12 NOTE — Telephone Encounter (Signed)
Left pharmacy a message to d/c meloxicam and fill diclofenac.

## 2016-05-12 NOTE — Telephone Encounter (Signed)
I agree with just doing one of the 2 I sent the diclofenac because she said that meloxicam was making her upset to her stomach so discontinue the meloxicam and try the diclofenac

## 2016-05-27 DIAGNOSIS — N6001 Solitary cyst of right breast: Secondary | ICD-10-CM | POA: Diagnosis not present

## 2016-05-28 ENCOUNTER — Other Ambulatory Visit: Payer: Self-pay | Admitting: Family

## 2016-05-28 DIAGNOSIS — M5442 Lumbago with sciatica, left side: Secondary | ICD-10-CM

## 2016-05-28 DIAGNOSIS — M545 Low back pain, unspecified: Secondary | ICD-10-CM

## 2016-05-28 NOTE — Telephone Encounter (Signed)
Unfortunately controlled substances cannot be prefilled outside of visits per policy. Please come in and we can discuss medication for

## 2016-05-28 NOTE — Telephone Encounter (Signed)
.   Last seen and filled 03/11/16. Hawks pt. Route to pool

## 2016-05-29 ENCOUNTER — Ambulatory Visit (INDEPENDENT_AMBULATORY_CARE_PROVIDER_SITE_OTHER): Payer: Medicare Other | Admitting: Family Medicine

## 2016-05-29 VITALS — BP 128/76 | HR 80 | Temp 97.6°F | Ht 61.5 in | Wt 140.0 lb

## 2016-05-29 DIAGNOSIS — M545 Low back pain, unspecified: Secondary | ICD-10-CM

## 2016-05-29 DIAGNOSIS — M5442 Lumbago with sciatica, left side: Secondary | ICD-10-CM | POA: Diagnosis not present

## 2016-05-29 DIAGNOSIS — M25562 Pain in left knee: Secondary | ICD-10-CM

## 2016-05-29 DIAGNOSIS — R300 Dysuria: Secondary | ICD-10-CM | POA: Diagnosis not present

## 2016-05-29 DIAGNOSIS — N390 Urinary tract infection, site not specified: Secondary | ICD-10-CM | POA: Diagnosis not present

## 2016-05-29 MED ORDER — CEPHALEXIN 500 MG PO CAPS: 500.0000 mg | ORAL_CAPSULE | Freq: Three times a day (TID) | ORAL | Status: DC

## 2016-05-29 MED ORDER — IBUPROFEN 600 MG PO TABS: 600.0000 mg | ORAL_TABLET | Freq: Three times a day (TID) | ORAL | Status: DC | PRN

## 2016-05-29 MED ORDER — TRAMADOL-ACETAMINOPHEN 37.5-325 MG PO TABS: ORAL_TABLET | ORAL | Status: DC

## 2016-05-29 MED ORDER — FLUCONAZOLE 150 MG PO TABS: 150.0000 mg | ORAL_TABLET | Freq: Once | ORAL | Status: DC

## 2016-05-29 NOTE — Addendum Note (Signed)
Addended by: Timmothy Euler on: 05/29/2016 11:05 AM   Modules accepted: Orders, Medications, SmartSet

## 2016-05-29 NOTE — Addendum Note (Signed)
Addended by: Timmothy Euler on: 05/29/2016 11:02 AM   Modules accepted: Orders, SmartSet

## 2016-05-29 NOTE — Progress Notes (Addendum)
   HPI  Patient presents today here with concern for UTI and to discuss left knee pain.  Patient explains that about 2 weeks ago she fell on her left knee. She was seen in urgent care who did an x-ray and stated that all the bones were okay. They explain that she had a knee effusion offered to drain it. She states that the swelling has improved dramatically, however she continues to have some tightness with walking. She also has difficulty flexing her knee completely.  UTI Foul-smelling urine for about one week. Also fatigue and malaise. No dysuria or back pain. She also has mild abdominal pain.  She's tolerating food and fluids normally.    PMH: Smoking status noted ROS: Per HPI  Objective: BP 128/76 mmHg  Pulse 80  Temp(Src) 97.6 F (36.4 C) (Oral)  Ht 5' 1.5" (1.562 m)  Wt 140 lb (63.504 kg)  BMI 26.03 kg/m2  LMP 10/16/2014 Gen: NAD, alert, cooperative with exam HEENT: NCAT CV: RRR, good S1/S2, no murmur Resp: CTABL, no wheezes, non-labored Ext: No edema, warm Neuro: Alert and oriented, No gross deficits  MSK: L knee without erythema bruising. Mild to moderate effusion.  No joint line tenderness.  ligamentously intact to Lachman's and with varus and valgus stress.  Negative McMurray's test   Assessment and plan:  # UTI Treat with Keflex, culture pending Return precautions provided   # Left knee pain After fall, x-ray has been done and negative at urgent care Continues to have pain that is improving as expected. She would like physical therapy which I have ordered. Recommended repeat knee x-ray early next week given continued pain and previous surgery She has history of previous knee surgery  Tramadol also given to help with pain.motrin as well, diclofenac causing GI upset.      Orders Placed This Encounter  Procedures  . Urine culture  . Urinalysis  . Ambulatory referral to Physical Therapy    Referral Priority:  Routine    Referral Type:   Physical Medicine    Referral Reason:  Specialty Services Required    Requested Specialty:  Physical Therapy    Number of Visits Requested:  1    Meds ordered this encounter  Medications  . cephALEXin (KEFLEX) 500 MG capsule    Sig: Take 1 capsule (500 mg total) by mouth 3 (three) times daily.    Dispense:  21 capsule    Refill:  0  . fluconazole (DIFLUCAN) 150 MG tablet    Sig: Take 1 tablet (150 mg total) by mouth once.    Dispense:  1 tablet    Refill:  0  . traMADol-acetaminophen (ULTRACET) 37.5-325 MG tablet    Sig: TAKE 2 TABLETS BY MOUTH EVERY 6 TO 8 HOURS AS NEEDED MAX 8/24 HRS    Dispense:  30 tablet    Refill:  0  . ibuprofen (ADVIL,MOTRIN) 600 MG tablet    Sig: Take 1 tablet (600 mg total) by mouth every 8 (eight) hours as needed for mild pain or moderate pain.    Dispense:  30 tablet    Refill:  0    Laroy Apple, MD [facility] 05/29/2016, 10:41 AM

## 2016-05-29 NOTE — Patient Instructions (Signed)
Great to see you!  I have sent an antibiotic for UTI, Keflex. Be sure to finish all of the pills  You will hear from physical therapy to arrange an appointment  Urinary Tract Infection Urinary tract infections (UTIs) can develop anywhere along your urinary tract. Your urinary tract is your body's drainage system for removing wastes and extra water. Your urinary tract includes two kidneys, two ureters, a bladder, and a urethra. Your kidneys are a pair of bean-shaped organs. Each kidney is about the size of your fist. They are located below your ribs, one on each side of your spine. CAUSES Infections are caused by microbes, which are microscopic organisms, including fungi, viruses, and bacteria. These organisms are so small that they can only be seen through a microscope. Bacteria are the microbes that most commonly cause UTIs. SYMPTOMS  Symptoms of UTIs may vary by age and gender of the patient and by the location of the infection. Symptoms in young women typically include a frequent and intense urge to urinate and a painful, burning feeling in the bladder or urethra during urination. Older women and men are more likely to be tired, shaky, and weak and have muscle aches and abdominal pain. A fever may mean the infection is in your kidneys. Other symptoms of a kidney infection include pain in your back or sides below the ribs, nausea, and vomiting. DIAGNOSIS To diagnose a UTI, your caregiver will ask you about your symptoms. Your caregiver will also ask you to provide a urine sample. The urine sample will be tested for bacteria and white blood cells. White blood cells are made by your body to help fight infection. TREATMENT  Typically, UTIs can be treated with medication. Because most UTIs are caused by a bacterial infection, they usually can be treated with the use of antibiotics. The choice of antibiotic and length of treatment depend on your symptoms and the type of bacteria causing your  infection. HOME CARE INSTRUCTIONS  If you were prescribed antibiotics, take them exactly as your caregiver instructs you. Finish the medication even if you feel better after you have only taken some of the medication.  Drink enough water and fluids to keep your urine clear or pale yellow.  Avoid caffeine, tea, and carbonated beverages. They tend to irritate your bladder.  Empty your bladder often. Avoid holding urine for long periods of time.  Empty your bladder before and after sexual intercourse.  After a bowel movement, women should cleanse from front to back. Use each tissue only once. SEEK MEDICAL CARE IF:   You have back pain.  You develop a fever.  Your symptoms do not begin to resolve within 3 days. SEEK IMMEDIATE MEDICAL CARE IF:   You have severe back pain or lower abdominal pain.  You develop chills.  You have nausea or vomiting.  You have continued burning or discomfort with urination. MAKE SURE YOU:   Understand these instructions.  Will watch your condition.  Will get help right away if you are not doing well or get worse.   This information is not intended to replace advice given to you by your health care provider. Make sure you discuss any questions you have with your health care provider.   Document Released: 09/15/2005 Document Revised: 08/27/2015 Document Reviewed: 01/14/2012 Elsevier Interactive Patient Education [employer].

## 2016-05-30 LAB — URINE CULTURE

## 2016-05-31 ENCOUNTER — Other Ambulatory Visit (INDEPENDENT_AMBULATORY_CARE_PROVIDER_SITE_OTHER): Payer: Medicare Other

## 2016-05-31 DIAGNOSIS — M25562 Pain in left knee: Secondary | ICD-10-CM | POA: Diagnosis not present

## 2016-05-31 LAB — URINALYSIS
BILIRUBIN UA: NEGATIVE
GLUCOSE, UA: NEGATIVE
KETONES UA: NEGATIVE
NITRITE UA: NEGATIVE
Protein, UA: NEGATIVE
SPEC GRAV UA: 1.02 (ref 1.005–1.030)
UUROB: 0.2 mg/dL (ref 0.2–1.0)
pH, UA: 6.5 (ref 5.0–7.5)

## 2016-06-14 ENCOUNTER — Ambulatory Visit: Payer: Medicare Other | Attending: Family Medicine | Admitting: Physical Therapy

## 2016-06-14 ENCOUNTER — Ambulatory Visit: Payer: Medicare Other | Admitting: Family

## 2016-06-14 DIAGNOSIS — M6281 Muscle weakness (generalized): Secondary | ICD-10-CM | POA: Diagnosis not present

## 2016-06-14 DIAGNOSIS — M25562 Pain in left knee: Secondary | ICD-10-CM | POA: Diagnosis not present

## 2016-06-14 NOTE — Therapy (Signed)
[facility] [place], [place], [address] Phone: [phone]   Fax:  [phone]  Physical Therapy Evaluation  Patient Details  Name: Sandra Adams MRN: [id number] Date of Birth: Jan 11, 1966 Referring Provider: Kenn File MD.  Encounter Date: 06/14/2016      PT End of Session - 06/14/16 1513    Visit Number 1   Number of Visits 16   Date for PT Re-Evaluation 08/13/16   PT Start Time 1125   PT Stop Time 1215   PT Time Calculation (min) 50 min   Activity Tolerance Patient tolerated treatment well   Behavior During Therapy [facility] for tasks assessed/performed      Past Medical History  Diagnosis Date  . Anxiety   . Depression   . Migraine   . Scoliosis   . Menopausal symptom     Past Surgical History  Procedure Laterality Date  . Knee surgery Left 1974 and 1979    There were no vitals filed for this visit.       Subjective Assessment - 06/14/16 1518    Subjective My knee swelled a lot when it happened and it turned balck and blue.   Pertinent History 2 previous left knee surgeries.   Limitations Standing   How long can you stand comfortably? 15 minutes.   Patient Stated Goals Get out of pain.   Currently in Pain? Yes   Pain Score 6    Pain Location Knee   Pain Orientation Left   Pain Descriptors / Indicators Aching;Sore   Pain Onset More than a month ago   Pain Frequency Constant   Aggravating Factors  Standing a lot.   Pain Relieving Factors Medication.            [facility] PT Assessment - 06/14/16 0001    Assessment   Medical Diagnosis Left knee pain.   Referring Provider Kenn File MD.   Onset Date/Surgical Date --  05/01/16.   Precautions   Precautions None   Restrictions   Weight Bearing Restrictions No   Balance Screen   Has the patient fallen in the past 6 months Yes   How many times? --  1   Has the patient had a decrease in activity level because of a fear of falling?  Yes    Is the patient reluctant to leave their home because of a fear of falling?  No   Home Ecologist residence   Prior Function   Level of Independence Independent   Observation/Other Assessments-Edema    Edema Circumferential   Circumferential Edema   Circumferential - Right Mid-patellar and just below patella left knee 1 cm > right.   ROM / Strength   AROM / PROM / Strength AROM;PROM;Strength   AROM   Overall AROM Comments 15 degrees left knee extensor lag.  Left patellar mal-tracking with end range flexion.   PROM   Overall PROM Comments Full left knee PROM.   Strength   Overall Strength Comments Significant left quadriceps atrophy especially the patient's left VMO when contralaterally compared.   Palpation   Palpation comment No specific areas of palpable tenderness.   Special Tests    Special Tests --  Left patella hypermobile.  Normal stability testing.   Ambulation/Gait   Gait Pattern Decreased stance time - left;Antalgic                   [facility] Adult PT Treatment/Exercise - 06/14/16 0001  Modalities   Modalities Network engineer Stimulation Location Left knee.   Electrical Stimulation Action IFC   Electrical Stimulation Parameters 1-10 Hz x 15 minutes.   Electrical Stimulation Goals Edema;Pain   Vasopneumatic   Number Minutes Vasopneumatic  15 minutes   Vasopnuematic Location  --  Left knee.   Vasopneumatic Pressure Medium                  PT Short Term Goals - 06/14/16 1536    PT SHORT TERM GOAL #1   Title Independent with an initial HEP.   Time 2   Period Weeks   Status New           PT Long Term Goals - 06/14/16 1536    PT LONG TERM GOAL #1   Title Independent with an advanced HEP.   Time 8   Period Weeks   Status New   PT LONG TERM GOAL #2   Title Eliminate left knee extensor lag to avoid the patient's left kne from giving way.   Time  8   Period Weeks   Status New   PT LONG TERM GOAL #3   Title 5/5 left knee strength to avoid left knee giving way.   Time 8   Period Weeks   Status New   PT LONG TERM GOAL #4   Title Perform ADL's with pain not > 3/10.   Time 8   Period Weeks   Status New               Plan - 06/14/16 1525    Clinical Impression Statement The patient states that on 05/01/16 her left kneecap popped out and she fell to the ground.  She reports the pain was excuriating and she forcibly extended her left knee to pu it back in place.  A short time after her knee swelled a great deal and began to turn black and blue.  Patient's pain-level  is a 6/10 with prolonged standing.  She exhibits an extensor lag and left VMO atrophy.   Rehab Potential Excellent   PT Frequency 2x / week   PT Duration 8 weeks   PT Treatment/Interventions ADLs/Self Care Home Management;Cryotherapy;Electrical Stimulation;Moist Heat;Therapeutic exercise;Therapeutic activities;Ultrasound;Neuromuscular re-education;Patient/family education;Manual techniques;Vasopneumatic Device   PT Next Visit Plan Left hip abduction and ER strengthening; Left quad strengthening; VMS to VMO; pain-free left quadriceps strengthening.   Consulted and Agree with Plan of Care Patient      Patient will benefit from skilled therapeutic intervention in order to improve the following deficits and impairments:  Pain, Decreased activity tolerance, Decreased range of motion  Visit Diagnosis: Pain in left knee - Plan: PT plan of care cert/re-cert  Muscle weakness (generalized) - Plan: PT plan of care cert/re-cert      G-Codes - [id number]    Functional Assessment Tool Used Clincial judgement.   Functional Limitation Mobility: Walking and moving around   Mobility: Walking and Moving Around Current Status [phone]) At least 20 percent but less than 40 percent impaired, limited or restricted       Problem List Patient Active Problem List   Diagnosis  Date Noted  . Yeast vaginitis 09/27/2015  . GERD (gastroesophageal reflux disease) 10/01/2014  . GAD (generalized anxiety disorder) 10/01/2014  . Depression 10/01/2014  . Menopausal hot flushes 09/16/2014  . Dysmenorrhea 05/20/2014  . Migraine without aura 03/27/2014    Felcia Huebert, [place] MPT 06/14/2016, 3:48 PM  [facility] [place], [place], [address] Phone: [phone]   Fax:  [phone]  Name: MARKIYA MANA MRN: [id number] Date of Birth: [birth year]

## 2016-06-17 ENCOUNTER — Encounter: Payer: Self-pay | Admitting: Physical Therapy

## 2016-06-17 ENCOUNTER — Ambulatory Visit: Payer: Medicare Other | Admitting: Physical Therapy

## 2016-06-17 DIAGNOSIS — M6281 Muscle weakness (generalized): Secondary | ICD-10-CM | POA: Diagnosis not present

## 2016-06-17 DIAGNOSIS — M25562 Pain in left knee: Secondary | ICD-10-CM

## 2016-06-17 NOTE — Therapy (Signed)
[facility] [place], [place], [address] Phone: [phone]   Fax:  [phone]  Physical Therapy Treatment  Patient Details  Name: Sandra Adams MRN: [id number] Date of Birth: 08-Jun-1966 Referring Provider: Kenn File MD.  Encounter Date: 06/17/2016      PT End of Session - 06/17/16 0841    Visit Number 2   Number of Visits 16   Date for PT Re-Evaluation 08/13/16   PT Start Time 0818   PT Stop Time 0915   PT Time Calculation (min) 57 min   Activity Tolerance Patient tolerated treatment well   Behavior During Therapy [facility] for tasks assessed/performed      Past Medical History  Diagnosis Date  . Anxiety   . Depression   . Migraine   . Scoliosis   . Menopausal symptom     Past Surgical History  Procedure Laterality Date  . Knee surgery Left 1974 and 1979    There were no vitals filed for this visit.      Subjective Assessment - 06/17/16 0821    Subjective Patient reported soreness esp with standing   Pertinent History 2 previous left knee surgeries.   Limitations Standing   How long can you stand comfortably? 15 minutes.   Patient Stated Goals Get out of pain.   Currently in Pain? Yes   Pain Score 4    Pain Location Knee   Pain Orientation Left   Pain Descriptors / Indicators Aching;Sore   Pain Type Chronic pain;Acute pain   Pain Onset 1 to 4 weeks ago   Pain Frequency Constant   Aggravating Factors  prolong standing   Pain Relieving Factors meds and ice                         [facility] Adult PT Treatment/Exercise - 06/17/16 0001    Exercises   Exercises Knee/Hip   Knee/Hip Exercises: Aerobic   Nustep x56mn L2   Knee/Hip Exercises: Supine   Quad Sets AROM;Strengthening;Left;2 sets;10 reps   Short Arc Q[employer] CorporationStrengthening;AROM;Left;3 sets;10 reps   Straight Leg Raises AROM;Strengthening;Left;2 sets;10 reps   Knee/Hip Exercises: Sidelying   Hip ABduction  AROM;Strengthening;Left;2 sets;10 reps   Clams 2x10   Electrical Stimulation   Electrical Stimulation Location Left knee.   Electrical Stimulation Action VMS to VMO/Quad   Electrical Stimulation Parameters 10x10 x170m with VMS for activation   Electrical Stimulation Goals Neuromuscular facilitation   Vasopneumatic   Number Minutes Vasopneumatic  15 minutes   Vasopnuematic Location  Knee   Vasopneumatic Pressure Medium                PT Education - 06/17/16 0840    Education provided Yes   Education Details HEP   Person(s) Educated Patient   Methods Explanation;Demonstration;Handout   Comprehension Verbalized understanding;Returned demonstration          PT Short Term Goals - 06/17/16 0842    PT SHORT TERM GOAL #1   Title Independent with an initial HEP.   Time 2   Period Weeks   Status Achieved           PT Long Term Goals - 06/14/16 1536    PT LONG TERM GOAL #1   Title Independent with an advanced HEP.   Time 8   Period Weeks   Status New   PT LONG TERM GOAL #2   Title Eliminate left knee extensor lag to avoid the patient's  left kne from giving way.   Time 8   Period Weeks   Status New   PT LONG TERM GOAL #3   Title 5/5 left knee strength to avoid left knee giving way.   Time 8   Period Weeks   Status New   PT LONG TERM GOAL #4   Title Perform ADL's with pain not > 3/10.   Time 8   Period Weeks   Status New               Plan - 06/17/16 0847    Clinical Impression Statement Patient tolerated treatment well today and is progressing with left knee strengthening. Patient reported most of her pain with prolong walking. Educated patient with low level strengthening with HEP today. Patient had good understanding of exercises and with correct technique. Patient met STG#1 today, with LTG's ongoing due to pain and strength limitations.    Rehab Potential Excellent   PT Frequency 2x / week   PT Duration 8 weeks   PT Treatment/Interventions  ADLs/Self Care Home Management;Cryotherapy;Electrical Stimulation;Moist Heat;Therapeutic exercise;Therapeutic activities;Ultrasound;Neuromuscular re-education;Patient/family education;Manual techniques;Vasopneumatic Device   PT Next Visit Plan cont with POC per MPT for Left hip abduction and ER strengthening; Left quad strengthening; VMS to VMO; pain-free left quadriceps strengthening.   Consulted and Agree with Plan of Care Patient      Patient will benefit from skilled therapeutic intervention in order to improve the following deficits and impairments:  Pain, Decreased activity tolerance, Decreased range of motion  Visit Diagnosis: Pain in left knee  Muscle weakness (generalized)     Problem List Patient Active Problem List   Diagnosis Date Noted  . Yeast vaginitis 09/27/2015  . GERD (gastroesophageal reflux disease) 10/01/2014  . GAD (generalized anxiety disorder) 10/01/2014  . Depression 10/01/2014  . Menopausal hot flushes 09/16/2014  . Dysmenorrhea 05/20/2014  . Migraine without aura 03/27/2014    Sandra Adams P, PTA 06/17/2016, 9:15 AM  [facility] [place], [place], [address] Phone: [phone]   Fax:  [phone]  Name: Sandra Adams MRN: [id number] Date of Birth: August 16, 1966

## 2016-06-17 NOTE — Patient Instructions (Signed)
Strengthening: Hip Abduction (Side-Lying)  Strengthening: Straight Leg Raise (Phase 1)  Repeat _10___ times per set. Do __2__ sets per session. Do __2__ sessions per day.      Straight Leg Raise   Tighten stomach and slowly raise locked right leg __4__ inches from floor. Repeat __10-30__ times per set. Do __2__ sets per session. Do __2__ sessions per day.

## 2016-06-21 ENCOUNTER — Ambulatory Visit: Payer: Medicare Other | Attending: Family Medicine | Admitting: *Deleted

## 2016-06-21 DIAGNOSIS — M6281 Muscle weakness (generalized): Secondary | ICD-10-CM | POA: Diagnosis not present

## 2016-06-21 DIAGNOSIS — M25562 Pain in left knee: Secondary | ICD-10-CM | POA: Diagnosis not present

## 2016-06-21 NOTE — Therapy (Addendum)
[facility] [place], [place], [address] Phone: [phone]   Fax:  [phone]  Physical Therapy Treatment  Patient Details  Name: Sandra Adams MRN: [id number] Date of Birth: [birth year] Referring Provider: Kenn File MD.  Encounter Date: 06/21/2016      PT End of Session - 06/21/16 1440    Visit Number 3   Number of Visits 16   Date for PT Re-Evaluation 08/13/16   PT Start Time 1430   PT Stop Time 2979   PT Time Calculation (min) 49 min      Past Medical History  Diagnosis Date  . Anxiety   . Depression   . Migraine   . Scoliosis   . Menopausal symptom     Past Surgical History  Procedure Laterality Date  . Knee surgery Left 1974 and 1979    There were no vitals filed for this visit.      Subjective Assessment - 06/21/16 1435    Subjective Patient reported soreness esp with standing   Pertinent History 2 previous left knee surgeries.   Limitations Standing   How long can you stand comfortably? 15 minutes.   Patient Stated Goals Get out of pain.   Currently in Pain? Yes   Pain Score 3    Pain Location Knee   Pain Orientation Left   Pain Descriptors / Indicators Aching;Sore   Pain Type Chronic pain;Acute pain   Pain Onset 1 to 4 weeks ago   Pain Frequency Constant                         [facility] Adult PT Treatment/Exercise - 06/21/16 0001    Exercises   Exercises Knee/Hip   Knee/Hip Exercises: Aerobic   Nustep x76mn L6,7  UE and LE activity   Knee/Hip Exercises: Supine   Quad Sets AROM;Strengthening;Left;2 sets;10 reps   Straight Leg Raises AROM;Strengthening;Left;2 sets;10 reps   Knee/Hip Exercises: Sidelying   Hip ABduction AROM;Strengthening;Left;2 sets;10 reps   Clams 2x10   Modalities   Modalities Electrical Stimulation;Vasopneumatic   Electrical Stimulation   Electrical Stimulation Location Left knee.   Electrical Stimulation Action VMS to LT VMO   Electrical  Stimulation Parameters 10 sec on/off x 15 mins with quad sets   Electrical Stimulation Goals Neuromuscular facilitation   Vasopneumatic   Number Minutes Vasopneumatic  15 minutes   Vasopnuematic Location  Knee   Vasopneumatic Pressure Medium   Vasopneumatic Temperature  36                  PT Short Term Goals - 06/17/16 08921   PT SHORT TERM GOAL #1   Title Independent with an initial HEP.   Time 2   Period Weeks   Status Achieved           PT Long Term Goals - 06/14/16 1536    PT LONG TERM GOAL #1   Title Independent with an advanced HEP.   Time 8   Period Weeks   Status New   PT LONG TERM GOAL #2   Title Eliminate left knee extensor lag to avoid the patient's left kne from giving way.   Time 8   Period Weeks   Status New   PT LONG TERM GOAL #3   Title 5/5 left knee strength to avoid left knee giving way.   Time 8   Period Weeks   Status New   PT LONG TERM GOAL #4  Title Perform ADL's with pain not > 3/10.   Time 8   Period Weeks   Status New               Plan - 06/21/16 1441    Clinical Impression Statement Pt did better today with exs and was able to do more. She did well with  VMO activation today and had less pain than last Rx. Mild swelling around patella was still notable. No new goals Met today.    Rehab Potential Excellent   PT Frequency 2x / week   PT Duration 8 weeks   PT Treatment/Interventions ADLs/Self Care Home Management;Cryotherapy;Electrical Stimulation;Moist Heat;Therapeutic exercise;Therapeutic activities;Ultrasound;Neuromuscular re-education;Patient/family education;Manual techniques;Vasopneumatic Device   PT Next Visit Plan cont with POC per MPT for Left hip abduction and ER strengthening; Left quad strengthening; VMS to VMO; pain-free left quadriceps strengthening.   Consulted and Agree with Plan of Care Patient      Patient will benefit from skilled therapeutic intervention in order to improve the following deficits  and impairments:  Pain, Decreased activity tolerance, Decreased range of motion  Visit Diagnosis: Pain in left knee  Muscle weakness (generalized)     Problem List Patient Active Problem List   Diagnosis Date Noted  . Yeast vaginitis 09/27/2015  . GERD (gastroesophageal reflux disease) 10/01/2014  . GAD (generalized anxiety disorder) 10/01/2014  . Depression 10/01/2014  . Menopausal hot flushes 09/16/2014  . Dysmenorrhea 05/20/2014  . Migraine without aura 03/27/2014    Sandra Adams,CHRIS, PTA 06/21/2016, 6:16 PM  [facility] [address] [place], [place], [address] Phone: [phone]   Fax:  [phone]  Name: Sandra Adams MRN: [id number] Date of Birth: [birth year]  PHYSICAL THERAPY DISCHARGE SUMMARY  Visits from Start of Care: 3.  Current functional level related to goals / functional outcomes: See above.   Remaining deficits: See below.   Education / Equipment:  Plan: Patient agrees to discharge.  Patient goals were not met. Patient is being discharged due to not returning since the last visit.  ?????         Mali Applegate MPT

## 2016-07-02 ENCOUNTER — Ambulatory Visit: Payer: Medicare Other | Admitting: Physical Therapy

## 2016-07-02 ENCOUNTER — Encounter: Payer: Self-pay | Admitting: Family

## 2016-07-02 ENCOUNTER — Ambulatory Visit (INDEPENDENT_AMBULATORY_CARE_PROVIDER_SITE_OTHER): Payer: Medicare Other | Admitting: Family

## 2016-07-02 VITALS — BP 129/78 | HR 83 | Temp 97.1°F | Ht 61.5 in | Wt 142.4 lb

## 2016-07-02 DIAGNOSIS — G43009 Migraine without aura, not intractable, without status migrainosus: Secondary | ICD-10-CM

## 2016-07-02 DIAGNOSIS — E663 Overweight: Secondary | ICD-10-CM | POA: Diagnosis not present

## 2016-07-02 DIAGNOSIS — F329 Major depressive disorder, single episode, unspecified: Secondary | ICD-10-CM | POA: Diagnosis not present

## 2016-07-02 DIAGNOSIS — N951 Menopausal and female climacteric states: Secondary | ICD-10-CM | POA: Diagnosis not present

## 2016-07-02 DIAGNOSIS — K219 Gastro-esophageal reflux disease without esophagitis: Secondary | ICD-10-CM

## 2016-07-02 DIAGNOSIS — M79602 Pain in left arm: Secondary | ICD-10-CM

## 2016-07-02 DIAGNOSIS — F411 Generalized anxiety disorder: Secondary | ICD-10-CM

## 2016-07-02 DIAGNOSIS — R300 Dysuria: Secondary | ICD-10-CM

## 2016-07-02 DIAGNOSIS — N898 Other specified noninflammatory disorders of vagina: Secondary | ICD-10-CM | POA: Diagnosis not present

## 2016-07-02 DIAGNOSIS — F32A Depression, unspecified: Secondary | ICD-10-CM

## 2016-07-02 LAB — WET PREP FOR TRICH, YEAST, CLUE
Clue Cell Exam: NEGATIVE
Trichomonas Exam: NEGATIVE
YEAST EXAM: NEGATIVE

## 2016-07-02 MED ORDER — CYCLOBENZAPRINE HCL 5 MG PO TABS: 5.0000 mg | ORAL_TABLET | Freq: Three times a day (TID) | ORAL | Status: DC | PRN

## 2016-07-02 MED ORDER — PREDNISONE 10 MG (21) PO TBPK: 10.0000 mg | ORAL_TABLET | Freq: Every day | ORAL | Status: DC

## 2016-07-02 NOTE — Patient Instructions (Signed)

## 2016-07-02 NOTE — Addendum Note (Signed)
Addended by: Earlene Plater on: 07/02/2016 04:21 PM   Modules accepted: Orders

## 2016-07-02 NOTE — Progress Notes (Signed)
Subjective:    Patient ID: Sandra Adams, female    DOB: September 28, 1966, 50 y.o.   MRN: [id number]  Pt presents to the office today for chronic follow up.  Dysuria  This is a recurrent problem. The current episode started 1 to 4 weeks ago. The problem occurs intermittently. The problem has been waxing and waning. The quality of the pain is described as burning. The pain is at a severity of 2/10. The pain is mild. Associated symptoms include a discharge, frequency and hesitancy. Pertinent negatives include no flank pain, hematuria, nausea or vomiting. She has tried increased fluids for the symptoms. The treatment provided mild relief. Her past medical history is significant for recurrent UTIs.  Anxiety Presents for follow-up visit. Onset was 1 to 5 years ago. The problem has been waxing and waning. Symptoms include depressed mood, excessive worry, feeling of choking, irritability, nervous/anxious behavior and restlessness. Patient reports no nausea, palpitations, panic, shortness of breath or suicidal ideas. Symptoms occur constantly. The quality of sleep is poor.   Her past medical history is significant for anxiety/panic attacks and depression. Past treatments include SSRIs and benzodiazephines. Compliance with prior treatments has been variable.  Depression      The patient presents with depression.  This is a chronic problem.  The current episode started more than 1 year ago.   The onset quality is gradual.   The problem occurs intermittently.  The problem has been waxing and waning since onset.  Associated symptoms include hopelessness, irritable, restlessness, myalgias and sad.  Associated symptoms include no suicidal ideas.  Past treatments include SNRIs - Serotonin and norepinephrine reuptake inhibitors.  Compliance with treatment is variable.  Past medical history includes anxiety and depression.   Gastroesophageal Reflux She complains of heartburn. She reports no belching, no dysphagia, no  nausea or no wheezing. This is a chronic problem. The current episode started more than 1 year ago. The problem occurs frequently. The problem has been waxing and waning. The heartburn wakes her from sleep. The symptoms are aggravated by certain foods and lying down. She has tried a diet change for the symptoms. The treatment provided no relief.  Migraine  This is a chronic problem. The current episode started more than 1 year ago. The problem occurs intermittently ("every week weeks"). The pain is located in the frontal region. The pain does not radiate. The pain quality is similar to prior headaches. The quality of the pain is described as aching. The pain is moderate. Associated symptoms include phonophobia, photophobia and sinus pressure. Pertinent negatives include no nausea or vomiting. The symptoms are aggravated by noise and emotional stress. She has tried acetaminophen for the symptoms. The treatment provided mild relief.  Arm Pain  The incident occurred more than 1 week ago. There was no injury mechanism. The pain is present in the upper left arm. The quality of the pain is described as aching. The pain is at a severity of 10/10. The pain is moderate. The pain has been constant since the incident. Nothing aggravates the symptoms. She has tried rest and NSAIDs for the symptoms. The treatment provided mild relief.      Review of Systems  Constitutional: Positive for irritability.  HENT: Positive for sinus pressure.   Eyes: Positive for photophobia.  Respiratory: Negative.  Negative for shortness of breath and wheezing.   Cardiovascular: Negative.  Negative for palpitations.  Gastrointestinal: Positive for heartburn. Negative for dysphagia, nausea and vomiting.  Endocrine: Negative.   Genitourinary: Positive  for dysuria, hesitancy and frequency. Negative for hematuria and flank pain.  Musculoskeletal: Positive for myalgias.  Neurological: Negative.   Hematological: Negative.     Psychiatric/Behavioral: Positive for depression. Negative for suicidal ideas. The patient is nervous/anxious.   All other systems reviewed and are negative.      Objective:   Physical Exam  Constitutional: She is oriented to person, place, and time. She appears well-developed and well-nourished. She is irritable. No distress.  HENT:  Head: Normocephalic and atraumatic.  Right Ear: External ear normal.  Left Ear: External ear normal.  Nose: Nose normal.  Mouth/Throat: Oropharynx is clear and moist.  Eyes: Pupils are equal, round, and reactive to light.  Neck: Normal range of motion. Neck supple. No thyromegaly present.  Cardiovascular: Normal rate, regular rhythm, normal heart sounds and intact distal pulses.   No murmur heard. Pulmonary/Chest: Effort normal and breath sounds normal. No respiratory distress. She has no wheezes.  Abdominal: Soft. Bowel sounds are normal. She exhibits no distension. There is no tenderness.  Musculoskeletal: Normal range of motion. She exhibits no edema or tenderness.     Neurological: She is alert and oriented to person, place, and time. She has normal reflexes. No cranial nerve deficit.  Skin: Skin is warm and dry.  Psychiatric: She has a normal mood and affect. Her behavior is normal. Judgment and thought content normal.  Vitals reviewed.    BP 129/78 mmHg  Pulse 83  Temp(Src) 97.1 F (36.2 C) (Oral)  Ht 5' 1.5" (1.562 m)  Wt 142 lb 6.4 oz (64.592 kg)  BMI 26.47 kg/m2  LMP 10/16/2014      Assessment & Plan:  1. Overweight (BMI 25.0-29.9) - CMP14+EGFR  2. Migraine without aura and without status migrainosus, not intractable - CMP14+EGFR  3. Gastroesophageal reflux disease without esophagitis - CMP14+EGFR  4. Depression - CMP14+EGFR  5. GAD (generalized anxiety disorder) - CMP14+EGFR  6. Menopausal hot flushes - CMP14+EGFR  7. Vaginal discharge - CMP14+EGFR - WET PREP FOR [place], YEAST, CLUE  8. Dysuria - CMP14+EGFR -  Urinalysis, Complete  9. Left arm pain -Pt given rx of prednisone today Rest Continue motrin prn  - predniSONE (STERAPRED UNI-PAK 21 TAB) 10 MG (21) TBPK tablet; Take 1 tablet (10 mg total) by mouth daily. As directed x 6 days  Dispense: 21 tablet; Refill: 0 -Flexeril 5 mg TID as needed  Continue all meds Labs pending Health Maintenance reviewed Diet and exercise encouraged RTO 3 months  [name], FNP

## 2016-07-03 LAB — CMP14+EGFR
ALBUMIN: 4.4 g/dL (ref 3.5–5.5)
ALT: 39 IU/L — ABNORMAL HIGH (ref 0–32)
AST: 23 IU/L (ref 0–40)
Albumin/Globulin Ratio: 1.7 (ref 1.2–2.2)
Alkaline Phosphatase: 108 IU/L (ref 39–117)
BUN/Creatinine Ratio: 15 (ref 9–23)
BUN: 12 mg/dL (ref 6–24)
Bilirubin Total: 0.4 mg/dL (ref 0.0–1.2)
CALCIUM: 9.5 mg/dL (ref 8.7–10.2)
CO2: 26 mmol/L (ref 18–29)
CREATININE: 0.81 mg/dL (ref 0.57–1.00)
Chloride: 101 mmol/L (ref 96–106)
GFR calc Af Amer: 97 mL/min/{1.73_m2} (ref >59)
GFR, EST NON AFRICAN AMERICAN: 84 mL/min/{1.73_m2} (ref >59)
GLOBULIN, TOTAL: 2.6 g/dL (ref 1.5–4.5)
Glucose: 128 mg/dL — ABNORMAL HIGH (ref 65–99)
Potassium: 3.9 mmol/L (ref 3.5–5.2)
SODIUM: 144 mmol/L (ref 134–144)
Total Protein: 7 g/dL (ref 6.0–8.5)

## 2016-07-04 LAB — URINE CULTURE

## 2016-07-05 ENCOUNTER — Other Ambulatory Visit: Payer: Self-pay | Admitting: Family

## 2016-07-05 MED ORDER — METRONIDAZOLE 0.75 % VA GEL: 1.0000 | Freq: Every day | VAGINAL | Status: DC

## 2016-07-08 ENCOUNTER — Telehealth: Payer: Self-pay | Admitting: Family

## 2016-07-08 MED ORDER — METRONIDAZOLE 500 MG PO TABS: 500.0000 mg | ORAL_TABLET | Freq: Two times a day (BID) | ORAL | Status: DC

## 2016-07-08 NOTE — Telephone Encounter (Signed)
Prescription sent to pharmacy.

## 2016-07-08 NOTE — Telephone Encounter (Signed)
Pt aware.

## 2016-07-09 ENCOUNTER — Ambulatory Visit: Payer: Medicare Other | Admitting: Nurse Practitioner

## 2016-07-14 DIAGNOSIS — M25512 Pain in left shoulder: Secondary | ICD-10-CM | POA: Diagnosis not present

## 2016-07-16 ENCOUNTER — Telehealth: Payer: Self-pay | Admitting: Family

## 2016-07-19 ENCOUNTER — Other Ambulatory Visit: Payer: Self-pay | Admitting: Nurse Practitioner

## 2016-07-19 NOTE — Telephone Encounter (Signed)
Christy I do not know what she is talking about

## 2016-07-20 ENCOUNTER — Ambulatory Visit: Payer: Medicare Other | Admitting: Family

## 2016-07-20 ENCOUNTER — Encounter: Payer: Self-pay | Admitting: Family

## 2016-07-20 ENCOUNTER — Ambulatory Visit (INDEPENDENT_AMBULATORY_CARE_PROVIDER_SITE_OTHER): Payer: Medicare Other | Admitting: Family

## 2016-07-20 VITALS — BP 115/85 | HR 96 | Temp 98.0°F | Ht 61.5 in | Wt 141.0 lb

## 2016-07-20 DIAGNOSIS — D259 Leiomyoma of uterus, unspecified: Secondary | ICD-10-CM | POA: Diagnosis not present

## 2016-07-20 DIAGNOSIS — N3001 Acute cystitis with hematuria: Secondary | ICD-10-CM

## 2016-07-20 DIAGNOSIS — R109 Unspecified abdominal pain: Secondary | ICD-10-CM | POA: Diagnosis not present

## 2016-07-20 LAB — URINALYSIS
Bilirubin, UA: NEGATIVE
Glucose, UA: NEGATIVE
Ketones, UA: NEGATIVE
NITRITE UA: NEGATIVE
PH UA: 5 (ref 5.0–7.5)
PROTEIN UA: NEGATIVE
Urobilinogen, Ur: 0.2 mg/dL (ref 0.2–1.0)

## 2016-07-20 LAB — URINALYSIS, COMPLETE
BILIRUBIN UA: NEGATIVE
GLUCOSE, UA: NEGATIVE
Ketones, UA: NEGATIVE
Leukocytes, UA: NEGATIVE
Nitrite, UA: NEGATIVE
PH UA: 6 (ref 5.0–7.5)
PROTEIN UA: NEGATIVE
SPEC GRAV UA: 1.02 (ref 1.005–1.030)
UUROB: 0.2 mg/dL (ref 0.2–1.0)

## 2016-07-20 LAB — MICROSCOPIC EXAMINATION: BACTERIA UA: NONE SEEN

## 2016-07-20 MED ORDER — SULFAMETHOXAZOLE-TRIMETHOPRIM 800-160 MG PO TABS: 1.0000 | ORAL_TABLET | Freq: Two times a day (BID) | ORAL | 0 refills | Status: DC

## 2016-07-20 NOTE — Telephone Encounter (Signed)
Pt needs to be seen

## 2016-07-20 NOTE — Telephone Encounter (Signed)
Pt aware, appointment scheduled today for 4:25 pm

## 2016-07-20 NOTE — Progress Notes (Signed)
   Subjective:    Patient ID: Sandra Adams, female    DOB: 1966/10/23, 50 y.o.   MRN: [id number]  Abdominal Pain  This is a new problem. The current episode started in the past 7 days. The onset quality is gradual. The problem occurs intermittently. The problem has been waxing and waning. The pain is located in the suprapubic region. The pain is mild. Associated symptoms include frequency and myalgias. Pertinent negatives include no dysuria, headaches, hematuria, nausea or vomiting. The pain is relieved by nothing. She has tried nothing for the symptoms. The treatment provided no relief.      Review of Systems  Constitutional: Negative.   HENT: Negative.   Eyes: Negative.   Respiratory: Negative.  Negative for shortness of breath.   Cardiovascular: Negative.  Negative for palpitations.  Gastrointestinal: Positive for abdominal pain. Negative for nausea and vomiting.  Endocrine: Negative.   Genitourinary: Positive for frequency. Negative for dysuria and hematuria.  Musculoskeletal: Positive for myalgias.  Neurological: Negative.  Negative for headaches.  Hematological: Negative.   Psychiatric/Behavioral: Negative.   All other systems reviewed and are negative.      Objective:   Physical Exam  Constitutional: She is oriented to person, place, and time. She appears well-developed and well-nourished. No distress.  HENT:  Head: Normocephalic and atraumatic.  Eyes: Pupils are equal, round, and reactive to light.  Neck: Normal range of motion. Neck supple. No thyromegaly present.  Cardiovascular: Normal rate, regular rhythm, normal heart sounds and intact distal pulses.   No murmur heard. Pulmonary/Chest: Effort normal and breath sounds normal. No respiratory distress. She has no wheezes.  Abdominal: Soft. Bowel sounds are normal. She exhibits no distension. There is tenderness (mild lower abd pain).  Musculoskeletal: Normal range of motion. She exhibits no edema or tenderness.    Neurological: She is alert and oriented to person, place, and time.  Skin: Skin is warm and dry.  Psychiatric: She has a normal mood and affect. Her behavior is normal. Judgment and thought content normal.  Vitals reviewed.     BP 115/85   Pulse 96   Temp 98 F (36.7 C) (Oral)   Ht 5' 1.5" (1.562 m)   Wt 141 lb (64 kg)   LMP 10/16/2014   BMI 26.21 kg/m      Assessment & Plan:  1. Flank pain - Urinalysis  2. Acute cystitis with hematuria -Force fluids AZO over the counter X2 days RTO prn Culture pending - sulfamethoxazole-trimethoprim (BACTRIM DS) 800-160 MG tablet; Take 1 tablet by mouth 2 (two) times daily.  Dispense: 14 tablet; Refill: 0 - Urine culture  3. Uterine leiomyoma, unspecified location - Ambulatory referral to Gynecology  [name], FNP

## 2016-07-20 NOTE — Patient Instructions (Signed)
Asymptomatic Bacteriuria, Female Asymptomatic bacteriuria is the presence of a large number of bacteria in your urine without the usual symptoms of burning or frequent urination. The following conditions increase the risk of asymptomatic bacteriuria:  Diabetes mellitus.  Advanced age.  Pregnancy in the first trimester.  Kidney stones.  Kidney transplants.  Leaky kidney tube valve in young children (reflux). Treatment for this condition is not needed in most people and can lead to other problems such as too much yeast and growth of resistant bacteria. However, some people, such as pregnant women, do need treatment to prevent kidney infection. Asymptomatic bacteriuria in pregnancy is also associated with fetal growth restriction, premature labor, and newborn death. HOME CARE INSTRUCTIONS Monitor your condition for any changes. The following actions may help to relieve any discomfort you are feeling:  Drink enough water and fluids to keep your urine clear or pale yellow. Go to the bathroom more often to keep your bladder empty.  Keep the area around your vagina and rectum clean. Wipe yourself from front to back after urinating. SEEK IMMEDIATE MEDICAL CARE IF:  You develop signs of an infection such as:  Burning with urination.  Frequency of voiding.  Back pain.  Fever.  You have blood in the urine.  You develop a fever. MAKE SURE YOU:  Understand these instructions.  Will watch your condition.  Will get help right away if you are not doing well or get worse.   This information is not intended to replace advice given to you by your health care provider. Make sure you discuss any questions you have with your health care provider.   Document Released: 12/06/2005 Document Revised: 12/27/2014 Document Reviewed: 05/28/2013 Elsevier Interactive Patient Education 2016 [employer]  

## 2016-07-21 LAB — URINE CULTURE

## 2016-07-28 ENCOUNTER — Ambulatory Visit (INDEPENDENT_AMBULATORY_CARE_PROVIDER_SITE_OTHER): Payer: Medicare Other | Admitting: Family Medicine

## 2016-07-28 ENCOUNTER — Encounter: Payer: Self-pay | Admitting: Family Medicine

## 2016-07-28 ENCOUNTER — Telehealth: Payer: Self-pay | Admitting: Family

## 2016-07-28 ENCOUNTER — Other Ambulatory Visit: Payer: Self-pay | Admitting: Family

## 2016-07-28 VITALS — BP 114/79 | HR 84 | Temp 97.8°F | Ht 61.5 in | Wt 141.6 lb

## 2016-07-28 DIAGNOSIS — M545 Low back pain, unspecified: Secondary | ICD-10-CM

## 2016-07-28 DIAGNOSIS — N898 Other specified noninflammatory disorders of vagina: Secondary | ICD-10-CM

## 2016-07-28 DIAGNOSIS — F411 Generalized anxiety disorder: Secondary | ICD-10-CM

## 2016-07-28 DIAGNOSIS — M5442 Lumbago with sciatica, left side: Secondary | ICD-10-CM

## 2016-07-28 LAB — WET PREP FOR TRICH, YEAST, CLUE
Clue Cell Exam: NEGATIVE
TRICHOMONAS EXAM: NEGATIVE
Yeast Exam: NEGATIVE

## 2016-07-28 MED ORDER — REPHRESH VA GEL: 1.0000 "application " | Freq: Every day | VAGINAL | 11 refills | Status: DC

## 2016-07-28 NOTE — Progress Notes (Signed)
BP 114/79 (BP Location: Left Arm, Patient Position: Sitting, Cuff Size: Normal)   Pulse 84   Temp 97.8 F (36.6 C) (Oral)   Ht 5' 1.5" (1.562 m)   Wt 141 lb 9.6 oz (64.2 kg)   LMP 10/16/2014   BMI 26.32 kg/m    Subjective:    Patient ID: Sandra Adams, female    DOB: 1966-08-12, 50 y.o.   MRN: [id number]  HPI: Sandra Adams is a 50 y.o. female presenting on 07/28/2016 for Vaginal irritation & itching   HPI Vaginal irritation Patient comes in today because she has been having vaginal irritation. This is a more chronic issue that she deals with and she comes in recurrently with irritation and problems with her vagina. She has gotten examinations on multiple occasions and most recently earlier this year she had both an exam a Pap smear and full STD panel testing. She denies any fevers or chills or dysuria or hematuria or flank pain. She says that she does get some irritation with intercourse but more just there all the time.  Relevant past medical, surgical, family and social history reviewed and updated as indicated. Interim medical history since our last visit reviewed. Allergies and medications reviewed and updated.  Review of Systems  Constitutional: Negative for chills and fever.  HENT: Negative for congestion, ear discharge and ear pain.   Eyes: Negative for redness and visual disturbance.  Respiratory: Negative for chest tightness and shortness of breath.   Cardiovascular: Negative for chest pain and leg swelling.  Gastrointestinal: Negative for abdominal pain, constipation, diarrhea, nausea and vomiting.  Genitourinary: Positive for vaginal discharge and vaginal pain. Negative for decreased urine volume, difficulty urinating, dyspareunia, dysuria, flank pain, frequency, genital sores, hematuria, menstrual problem, pelvic pain, urgency and vaginal bleeding.  Musculoskeletal: Negative for back pain and gait problem.  Skin: Negative for rash.  Neurological: Negative for  light-headedness and headaches.  Psychiatric/Behavioral: Negative for agitation and behavioral problems.  All other systems reviewed and are negative.   Per HPI unless specifically indicated above     Medication List       Accurate as of 07/28/16 12:13 PM. Always use your most recent med list.          ALPRAZolam 0.25 MG tablet Commonly known as:  XANAX TAKE 1 TABLET AT BEDTIME AS NEEDED FOR ANXIETY   cyclobenzaprine 5 MG tablet Commonly known as:  FLEXERIL Take 1 tablet (5 mg total) by mouth 3 (three) times daily as needed for muscle spasms.   diclofenac sodium 1 % Gel Commonly known as:  VOLTAREN Apply 2 g topically 4 (four) times daily.   DULoxetine 30 MG capsule Commonly known as:  CYMBALTA Take 1 capsule (30 mg total) by mouth daily.   fluticasone 50 MCG/ACT nasal spray Commonly known as:  FLONASE Place 2 sprays into both nostrils daily.   ibuprofen 600 MG tablet Commonly known as:  ADVIL,MOTRIN Take 1 tablet (600 mg total) by mouth every 8 (eight) hours as needed for mild pain or moderate pain.   meloxicam 15 MG tablet Commonly known as:  MOBIC Take 1 tablet (15 mg total) by mouth daily.   metroNIDAZOLE 500 MG tablet Commonly known as:  FLAGYL Take 1 tablet (500 mg total) by mouth 2 (two) times daily.   omeprazole 20 MG capsule Commonly known as:  PRILOSEC Take 1 capsule (20 mg total) by mouth daily.   sulfamethoxazole-trimethoprim 800-160 MG tablet Commonly known as:  BACTRIM DS Take 1  tablet by mouth 2 (two) times daily.   traMADol-acetaminophen 37.5-325 MG tablet Commonly known as:  ULTRACET TAKE 2 TABLETS BY MOUTH EVERY 6 TO 8 HOURS AS NEEDED MAX 8/24 HRS         Objective:    BP 114/79 (BP Location: Left Arm, Patient Position: Sitting, Cuff Size: Normal)   Pulse 84   Temp 97.8 F (36.6 C) (Oral)   Ht 5' 1.5" (1.562 m)   Wt 141 lb 9.6 oz (64.2 kg)   LMP 10/16/2014   BMI 26.32 kg/m   Wt Readings from Last 3 Encounters:  07/28/16 141  lb 9.6 oz (64.2 kg)  07/20/16 141 lb (64 kg)  07/02/16 142 lb 6.4 oz (64.6 kg)    Physical Exam  Constitutional: She is oriented to person, place, and time. She appears well-developed and well-nourished. No distress.  Eyes: Conjunctivae and EOM are normal. Pupils are equal, round, and reactive to light.  Cardiovascular: Normal rate, regular rhythm, normal heart sounds and intact distal pulses.   No murmur heard. Pulmonary/Chest: Effort normal and breath sounds normal. No respiratory distress. She has no wheezes.  Abdominal: Soft. Bowel sounds are normal. She exhibits no distension. There is no tenderness. There is no rebound.  Genitourinary: Uterus normal. There is no rash, tenderness or lesion on the right labia. There is no rash, tenderness or lesion on the left labia. Cervix exhibits discharge (Small amount of white mucosal discharge). Cervix exhibits no motion tenderness and no friability. Right adnexum displays no mass and no tenderness. Left adnexum displays no mass and no tenderness. No erythema in the vagina. No signs of injury around the vagina. Vaginal discharge (Small amount of white mucosal discharge) found.  Musculoskeletal: Normal range of motion. She exhibits no edema or tenderness.  Neurological: She is alert and oriented to person, place, and time. Coordination normal.  Skin: Skin is warm and dry. No rash noted. She is not diaphoretic.  Psychiatric: She has a normal mood and affect. Her behavior is normal.  Nursing note and vitals reviewed.   Wet prep negative    Assessment & Plan:   Problem List Items Addressed This Visit    None    Visit Diagnoses    Vaginal irritation    -  Primary   Relevant Orders   WET PREP FOR [place], YEAST, CLUE      Instructed patient to use a vaginal flora restoring agent  Follow up plan: Return if symptoms worsen or fail to improve.  Counseling provided for all of the vaccine components Orders Placed This Encounter  Procedures  . WET  PREP FOR [place], YEAST, CLUE    Caryl Pina, MD [facility] 07/28/2016, 12:13 PM

## 2016-07-28 NOTE — Telephone Encounter (Signed)
I already tried to send it as a prescription, because it's over-the-counter I didn't think it would be covered but I did try to send to the pharmacy already. There are other forms of this medication but online only costs and 12 or $15

## 2016-07-28 NOTE — Telephone Encounter (Signed)
Detailed message left for patient.

## 2016-07-29 MED ORDER — TRAMADOL-ACETAMINOPHEN 37.5-325 MG PO TABS
ORAL_TABLET | ORAL | 0 refills | Status: DC
Start: 2016-07-29 — End: 2016-12-31

## 2016-07-29 MED ORDER — IBUPROFEN 600 MG PO TABS: 600.0000 mg | ORAL_TABLET | Freq: Three times a day (TID) | ORAL | 0 refills | Status: DC | PRN

## 2016-07-29 MED ORDER — ALPRAZOLAM 0.25 MG PO TABS: ORAL_TABLET | ORAL | 1 refills | Status: DC

## 2016-07-29 NOTE — Telephone Encounter (Signed)
Last seen 07/02/16. Xanax last filled 11/17/15 with 3 RF. Tramadol last filled 05/29/16. Route to pool to call in

## 2016-07-30 ENCOUNTER — Telehealth: Payer: Self-pay | Admitting: *Deleted

## 2016-07-30 NOTE — Telephone Encounter (Signed)
RX for Xanax called into [facility] Okay[facility] per [employer]

## 2016-07-30 NOTE — Telephone Encounter (Signed)
done

## 2016-07-30 NOTE — Telephone Encounter (Signed)
RX for Ultracet called into [facility] Okay[facility] per [employer]

## 2016-08-04 ENCOUNTER — Ambulatory Visit (INDEPENDENT_AMBULATORY_CARE_PROVIDER_SITE_OTHER): Payer: Medicare Other | Admitting: Family Medicine

## 2016-08-04 VITALS — BP 122/67 | HR 92 | Temp 97.8°F | Ht 61.5 in | Wt 146.6 lb

## 2016-08-04 DIAGNOSIS — R300 Dysuria: Secondary | ICD-10-CM | POA: Diagnosis not present

## 2016-08-04 LAB — URINALYSIS, COMPLETE
BILIRUBIN UA: NEGATIVE
Glucose, UA: NEGATIVE
Ketones, UA: NEGATIVE
LEUKOCYTES UA: NEGATIVE
Nitrite, UA: NEGATIVE
PH UA: 7 (ref 5.0–7.5)
PROTEIN UA: NEGATIVE
RBC, UA: NEGATIVE
Specific Gravity, UA: 1.02 (ref 1.005–1.030)
Urobilinogen, Ur: 0.2 mg/dL (ref 0.2–1.0)

## 2016-08-04 LAB — MICROSCOPIC EXAMINATION

## 2016-08-04 MED ORDER — PHENAZOPYRIDINE HCL 200 MG PO TABS: 200.0000 mg | ORAL_TABLET | Freq: Four times a day (QID) | ORAL | 0 refills | Status: DC | PRN

## 2016-08-04 MED ORDER — CIPROFLOXACIN HCL 250 MG PO TABS: 250.0000 mg | ORAL_TABLET | Freq: Two times a day (BID) | ORAL | 0 refills | Status: DC

## 2016-08-04 NOTE — Progress Notes (Signed)
Subjective:  Patient ID: Sandra Adams, female    DOB: [birth year]  Age: 50 y.o. MRN: [id number]  CC: Urinary Tract Infection (painful urination with urgency and burning, started yesterday)   HPI Sandra Adams presents for burning with urination and frequency for two days. Denies fever . No flank or abd. pain. No nausea, vomiting.Slight vaginal DC. No itching or odor.    History Sandra Adams has a past medical history of Anxiety; Depression; Menopausal symptom; Migraine; and Scoliosis.   She has a past surgical history that includes Knee surgery (Left, 1974 and 1979).   Her family history includes Anxiety disorder in her sister; COPD in her father, maternal aunt, and sister; Diabetes in her maternal aunt and maternal uncle; Epilepsy in her mother; Heart disease in her father; Hyperlipidemia in her father.She reports that she has never smoked. She has never used smokeless tobacco. She reports that she does not drink alcohol or use drugs.    ROS Review of Systems  Constitutional: Negative for chills, diaphoresis and fever.  HENT: Negative for congestion.   Eyes: Negative for visual disturbance.  Respiratory: Negative for cough and shortness of breath.   Cardiovascular: Negative for chest pain and palpitations.  Gastrointestinal: Negative for constipation, diarrhea and nausea.  Genitourinary: Positive for dysuria, frequency, urgency and vaginal discharge. Negative for decreased urine volume, flank pain, hematuria, menstrual problem and pelvic pain.  Musculoskeletal: Negative for arthralgias and joint swelling.  Skin: Negative for rash.  Neurological: Negative for dizziness and numbness.    Objective:  BP 122/67 (BP Location: Left Arm, Patient Position: Sitting, Cuff Size: Normal)   Pulse 92   Temp 97.8 F (36.6 C) (Oral)   Ht 5' 1.5" (1.562 m)   Wt 146 lb 9.6 oz (66.5 kg)   LMP 10/16/2014   SpO2 97%   BMI 27.25 kg/m   BP Readings from Last 3 Encounters:  08/04/16 122/67    07/28/16 114/79  07/20/16 115/85    Wt Readings from Last 3 Encounters:  08/04/16 146 lb 9.6 oz (66.5 kg)  07/28/16 141 lb 9.6 oz (64.2 kg)  07/20/16 141 lb (64 kg)     Physical Exam  Constitutional: She is oriented to person, place, and time. She appears well-developed and well-nourished.  HENT:  Head: Normocephalic and atraumatic.  Cardiovascular: Normal rate and regular rhythm.   No murmur heard. Pulmonary/Chest: Effort normal and breath sounds normal.  Abdominal: Soft. Bowel sounds are normal. She exhibits no mass. There is no tenderness. There is no rebound and no guarding.  Musculoskeletal: She exhibits no tenderness.  Neurological: She is alert and oriented to person, place, and time.  Skin: Skin is warm and dry.  Psychiatric: She has a normal mood and affect. Her behavior is normal.     Lab Results  Component Value Date   WBC 9.2 03/11/2016   HGB 13.2 03/11/2015   HCT 40.7 03/11/2016   PLT 234 03/11/2016   GLUCOSE 128 (H) 07/02/2016   CHOL 164 03/11/2016   TRIG 101 03/11/2016   HDL 55 03/11/2016   LDLCALC 89 03/11/2016   ALT 39 (H) 07/02/2016   AST 23 07/02/2016   NA 144 07/02/2016   K 3.9 07/02/2016   CL 101 07/02/2016   CREATININE 0.81 07/02/2016   BUN 12 07/02/2016   CO2 26 07/02/2016   TSH 1.390 03/11/2016    Dg Esophagus  Result Date: 12/24/2014 CLINICAL DATA:  Dysphagia and gastroesophageal reflux intermittently for 10 years. EXAM: ESOPHOGRAM/BARIUM SWALLOW TECHNIQUE:  Combined double contrast and single contrast examination performed using effervescent crystals, thick barium liquid, and thin barium liquid. FLUOROSCOPY TIME:  2 min, 1 second. COMPARISON:  None. FINDINGS: Swallowing function appeared normal without aspiration or penetration. The esophagus demonstrates normal caliber and mucosa throughout without stricture, mass or inflammatory change. Esophageal motility is unremarkable. The patient has a small hiatal hernia. No gastroesophageal reflux  was elicited on this examination. A 13 mm barium tablet passed easily into the stomach. IMPRESSION: Small hiatal hernia.  The examination is otherwise negative. Electronically Signed   By: Inge Rise M.D.   On: 12/24/2014 14:05    Assessment & Plan:   Sandra Adams was seen today for urinary tract infection.  Diagnoses and all orders for this visit:  Dysuria -     Urinalysis, Complete  Other orders -     ciprofloxacin (CIPRO) 250 MG tablet; Take 1 tablet (250 mg total) by mouth 2 (two) times daily. -     phenazopyridine (PYRIDIUM) 200 MG tablet; Take 1 tablet (200 mg total) by mouth 4 (four) times daily as needed for pain (urinary frequency &/or pain).    I have discontinued Sandra Adams's sulfamethoxazole-trimethoprim. I am also having her start on ciprofloxacin and phenazopyridine. Additionally, I am having her maintain her omeprazole, DULoxetine, fluticasone, meloxicam, diclofenac sodium, cyclobenzaprine, metroNIDAZOLE, REPHRESH, ALPRAZolam, ibuprofen, and traMADol-acetaminophen.  Meds ordered this encounter  Medications  . ciprofloxacin (CIPRO) 250 MG tablet    Sig: Take 1 tablet (250 mg total) by mouth 2 (two) times daily.    Dispense:  10 tablet    Refill:  0  . phenazopyridine (PYRIDIUM) 200 MG tablet    Sig: Take 1 tablet (200 mg total) by mouth 4 (four) times daily as needed for pain (urinary frequency &/or pain).    Dispense:  12 tablet    Refill:  0     Follow-up: Return if symptoms worsen or fail to improve.  Claretta Fraise, M.D.

## 2016-08-06 ENCOUNTER — Telehealth: Payer: Self-pay | Admitting: Family

## 2016-08-06 MED ORDER — NITROFURANTOIN MONOHYD MACRO 100 MG PO CAPS: 100.0000 mg | ORAL_CAPSULE | Freq: Two times a day (BID) | ORAL | 0 refills | Status: AC

## 2016-08-06 MED ORDER — FLUCONAZOLE 150 MG PO TABS: 150.0000 mg | ORAL_TABLET | ORAL | 0 refills | Status: DC | PRN

## 2016-08-06 NOTE — Telephone Encounter (Signed)
Patient of Dr Livia Snellen. Please review and advise

## 2016-08-06 NOTE — Telephone Encounter (Signed)
No improvement with 48h of cipro, most recent cultures with mixed urogen flora, will switch to nitrofurantoin. Pt thinks she has yeast infection as well, often gets them with abx. Sent in fluconazole.

## 2016-08-13 ENCOUNTER — Encounter: Payer: Self-pay | Admitting: Family Medicine

## 2016-08-13 ENCOUNTER — Ambulatory Visit (INDEPENDENT_AMBULATORY_CARE_PROVIDER_SITE_OTHER): Payer: Medicare Other | Admitting: Family Medicine

## 2016-08-13 VITALS — BP 115/71 | HR 73 | Temp 97.6°F | Ht 61.5 in | Wt 141.0 lb

## 2016-08-13 DIAGNOSIS — M7989 Other specified soft tissue disorders: Secondary | ICD-10-CM | POA: Diagnosis not present

## 2016-08-13 NOTE — Progress Notes (Signed)
BP 115/71   Pulse 73   Temp 97.6 F (36.4 C) (Oral)   Ht 5' 1.5" (1.562 m)   Wt 141 lb (64 kg)   LMP 10/16/2014   BMI 26.21 kg/m    Subjective:    Patient ID: [name], female    DOB: 1966-02-27, 50 y.o.   MRN: [id number]  HPI: Sandra Adams is a 50 y.o. female presenting on 08/13/2016 for Swelling in top of right foot (began 1 week ago)   HPI Right foot and ankle swelling Patient has concerns about right foot and ankle swelling has been going on for about the past week. She says it comes and goes but is worse throughout the day as she is on her feet more and then improves by morning. She has been trying to use heat packs on it to see if that will help and Epsom salts. She denies any pain from it or redness or warmth or any calf pain or any swelling going up into her leg. She denies any redness or warmth. She denies any trauma or rolling of the ankle.  Relevant past medical, surgical, family and social history reviewed and updated as indicated. Interim medical history since our last visit reviewed. Allergies and medications reviewed and updated.  Review of Systems  Constitutional: Negative for chills and fever.  Eyes: Negative for redness and visual disturbance.  Respiratory: Negative for chest tightness and shortness of breath.   Cardiovascular: Positive for leg swelling. Negative for chest pain.  Genitourinary: Negative for difficulty urinating and dysuria.  Musculoskeletal: Positive for joint swelling. Negative for arthralgias, back pain, gait problem and myalgias.  Skin: Negative for rash.  Neurological: Negative for light-headedness and headaches.  Psychiatric/Behavioral: Negative for agitation and behavioral problems.  All other systems reviewed and are negative.   Per HPI unless specifically indicated above     Medication List       Accurate as of 08/13/16  4:26 PM. Always use your most recent med list.          ALPRAZolam 0.25 MG tablet Commonly  known as:  XANAX TAKE 1 TABLET AT BEDTIME AS NEEDED FOR ANXIETY   cyclobenzaprine 5 MG tablet Commonly known as:  FLEXERIL Take 1 tablet (5 mg total) by mouth 3 (three) times daily as needed for muscle spasms.   diclofenac sodium 1 % Gel Commonly known as:  VOLTAREN Apply 2 g topically 4 (four) times daily.   DULoxetine 30 MG capsule Commonly known as:  CYMBALTA Take 1 capsule (30 mg total) by mouth daily.   fluticasone 50 MCG/ACT nasal spray Commonly known as:  FLONASE Place 2 sprays into both nostrils daily.   ibuprofen 600 MG tablet Commonly known as:  ADVIL,MOTRIN Take 1 tablet (600 mg total) by mouth every 8 (eight) hours as needed for mild pain or moderate pain.   meloxicam 15 MG tablet Commonly known as:  MOBIC Take 1 tablet (15 mg total) by mouth daily.   omeprazole 20 MG capsule Commonly known as:  PRILOSEC Take 1 capsule (20 mg total) by mouth daily.   phenazopyridine 200 MG tablet Commonly known as:  PYRIDIUM Take 1 tablet (200 mg total) by mouth 4 (four) times daily as needed for pain (urinary frequency &/or pain).   [place] Gel Place 1 application vaginally daily.   traMADol-acetaminophen 37.5-325 MG tablet Commonly known as:  ULTRACET TAKE 2 TABLETS BY MOUTH EVERY 6 TO 8 HOURS AS NEEDED MAX 8/24 HRS  Objective:    BP 115/71   Pulse 73   Temp 97.6 F (36.4 C) (Oral)   Ht 5' 1.5" (1.562 m)   Wt 141 lb (64 kg)   LMP 10/16/2014   BMI 26.21 kg/m   Wt Readings from Last 3 Encounters:  08/13/16 141 lb (64 kg)  08/04/16 146 lb 9.6 oz (66.5 kg)  07/28/16 141 lb 9.6 oz (64.2 kg)    Physical Exam  Constitutional: She is oriented to person, place, and time. She appears well-developed and well-nourished. No distress.  Eyes: Conjunctivae are normal.  Cardiovascular: Normal rate, regular rhythm, normal heart sounds and intact distal pulses.   No murmur heard. Pulmonary/Chest: Effort normal and breath sounds normal. No respiratory distress.  She has no wheezes.  Musculoskeletal: Normal range of motion. She exhibits no edema or tenderness.       Right ankle: She exhibits swelling (Trace swelling and lateral ankle and foot). She exhibits normal range of motion and no deformity. No tenderness.  Neurological: She is alert and oriented to person, place, and time. Coordination normal.  Skin: Skin is warm and dry. No rash noted. She is not diaphoretic.  Psychiatric: She has a normal mood and affect. Her behavior is normal.  Nursing note and vitals reviewed.     Assessment & Plan:   Problem List Items Addressed This Visit    None    Visit Diagnoses    Leg swelling    -  Primary   Trace leg swelling, likely due to venous pooling, use compression stockings and elevation and ice       Follow up plan: Return if symptoms worsen or fail to improve.  Counseling provided for all of the vaccine components No orders of the defined types were placed in this encounter.   Caryl Pina, MD [facility] 08/13/2016, 4:26 PM

## 2016-08-16 ENCOUNTER — Encounter: Payer: Medicare Other | Admitting: Obstetrics & Gynecology

## 2016-08-20 DIAGNOSIS — H2513 Age-related nuclear cataract, bilateral: Secondary | ICD-10-CM | POA: Diagnosis not present

## 2016-08-20 DIAGNOSIS — H40033 Anatomical narrow angle, bilateral: Secondary | ICD-10-CM | POA: Diagnosis not present

## 2016-08-27 DIAGNOSIS — H04123 Dry eye syndrome of bilateral lacrimal glands: Secondary | ICD-10-CM | POA: Diagnosis not present

## 2016-08-30 ENCOUNTER — Encounter: Payer: Medicare Other | Admitting: Obstetrics & Gynecology

## 2016-09-13 ENCOUNTER — Other Ambulatory Visit: Payer: Self-pay | Admitting: Family Medicine

## 2016-09-13 DIAGNOSIS — F411 Generalized anxiety disorder: Secondary | ICD-10-CM

## 2016-09-13 NOTE — Telephone Encounter (Signed)
We'll give her 1 month but she needs to come in for a depression recheck and not for other reasons before the next refill

## 2016-09-30 ENCOUNTER — Ambulatory Visit (INDEPENDENT_AMBULATORY_CARE_PROVIDER_SITE_OTHER): Payer: Medicare Other | Admitting: Pediatrics

## 2016-09-30 ENCOUNTER — Encounter: Payer: Self-pay | Admitting: Pediatrics

## 2016-09-30 VITALS — BP 116/82 | HR 85 | Temp 98.2°F | Ht 61.5 in | Wt 141.6 lb

## 2016-09-30 DIAGNOSIS — N760 Acute vaginitis: Secondary | ICD-10-CM | POA: Diagnosis not present

## 2016-09-30 DIAGNOSIS — R399 Unspecified symptoms and signs involving the genitourinary system: Secondary | ICD-10-CM

## 2016-09-30 DIAGNOSIS — N390 Urinary tract infection, site not specified: Secondary | ICD-10-CM

## 2016-09-30 DIAGNOSIS — R8281 Pyuria: Secondary | ICD-10-CM

## 2016-09-30 DIAGNOSIS — B9689 Other specified bacterial agents as the cause of diseases classified elsewhere: Secondary | ICD-10-CM | POA: Diagnosis not present

## 2016-09-30 DIAGNOSIS — Z780 Asymptomatic menopausal state: Secondary | ICD-10-CM

## 2016-09-30 LAB — MICROSCOPIC EXAMINATION: WBC, UA: >30 /hpf — AB (ref 0–?)

## 2016-09-30 LAB — URINALYSIS, COMPLETE
BILIRUBIN UA: NEGATIVE
GLUCOSE, UA: NEGATIVE
Ketones, UA: NEGATIVE
Nitrite, UA: NEGATIVE
PROTEIN UA: NEGATIVE
Specific Gravity, UA: 1.025 (ref 1.005–1.030)
UUROB: 0.2 mg/dL (ref 0.2–1.0)
pH, UA: 5.5 (ref 5.0–7.5)

## 2016-09-30 LAB — WET PREP FOR TRICH, YEAST, CLUE
Clue Cell Exam: POSITIVE — AB
TRICHOMONAS EXAM: NEGATIVE
Yeast Exam: NEGATIVE

## 2016-09-30 MED ORDER — ESCITALOPRAM OXALATE 5 MG PO TABS: 5.0000 mg | ORAL_TABLET | Freq: Every day | ORAL | 1 refills | Status: DC

## 2016-09-30 MED ORDER — NITROFURANTOIN MONOHYD MACRO 100 MG PO CAPS: 100.0000 mg | ORAL_CAPSULE | Freq: Two times a day (BID) | ORAL | 0 refills | Status: AC

## 2016-09-30 MED ORDER — PRO-BIOTIC BLEND PO CAPS: 1.0000 | ORAL_CAPSULE | Freq: Every day | ORAL | 1 refills | Status: DC

## 2016-09-30 NOTE — Progress Notes (Signed)
  Subjective:   Patient ID: Sandra Adams, female    DOB: [birth year], 50 y.o.   MRN: [id number] CC: Chills; Dizziness; Night Sweats; and Back Pain  HPI: Sandra Adams is a 50 y.o. female presenting for Chills; Dizziness; Night Sweats; and Back Pain  Ongoing symptoms of hot flashes, mood swings, feels angry suddenly then better Mood goes up and down Having hot flashes No period for over a year No weight loss Appetite has been fine  Some bad smelling vaginal discharge for a few days Having some burning with urination past few days No fevers Sometimes has dysuria  No thoughts of self harm, mood has been ok   Depression screen [facility] 2/9 09/30/2016 08/13/2016 08/04/2016 07/20/2016 07/02/2016  Decreased Interest 0 0 0 0 1  Down, Depressed, Hopeless 0 0 0 0 3  PHQ - 2 Score 0 0 0 0 4  Altered sleeping - - - - 0  Tired, decreased energy - - - - 2  Change in appetite - - - - 0  Feeling bad or failure about yourself  - - - - 1  Trouble concentrating - - - - 0  Moving slowly or fidgety/restless - - - - 1  Suicidal thoughts - - - - 0  PHQ-9 Score - - - - 8  Difficult doing work/chores - - - - -     Relevant past medical, surgical, family and social history reviewed. Allergies and medications reviewed and updated. History  Smoking Status  . Never Smoker  Smokeless Tobacco  . Never Used   ROS: Per HPI   Objective:    BP 116/82   Pulse 85   Temp 98.2 F (36.8 C) (Oral)   Ht 5' 1.5" (1.562 m)   Wt 141 lb 9.6 oz (64.2 kg)   LMP 10/16/2014   BMI 26.32 kg/m   Wt Readings from Last 3 Encounters:  09/30/16 141 lb 9.6 oz (64.2 kg)  08/13/16 141 lb (64 kg)  08/04/16 146 lb 9.6 oz (66.5 kg)    Gen: NAD, alert, cooperative with exam, NCAT EYES: EOMI, no conjunctival injection, or no icterus CV: NRRR, normal S1/S2, no murmur, distal pulses 2+ b/l Resp: CTABL, no wheez es, normal WOB Abd: +BS, soft, mild tenderness lower abd, ND. no guarding or organomegaly Ext: No edema,  warm Neuro: Alert and oriented MSK: normal muscle bulk  Assessment & Plan:  Sandra Adams was seen today for multiple complaints.   Diagnoses and all orders for this visit:  UTI symptoms -     Urinalysis, Complete -     WET PREP FOR TRICH, YEAST, CLUE  Menopause Discussed options, hot flashes and mood swings most bothersome, will start below -     escitalopram (LEXAPRO) 5 MG tablet; Take 1 tablet (5 mg total) by mouth daily.  Pyuria Positive UA -     nitrofurantoin, macrocrystal-monohydrate, (MACROBID) 100 MG capsule; Take 1 capsule (100 mg total) by mouth 2 (two) times daily. -     Urine culture  Bacterial vaginosis Discussed options, doesn't want to take metronidazole, probioticsiotics as below, will see if symptoms resolve,  -     Probiotic Product (PRO-BIOTIC BLEND) CAPS; Take 1 capsule by mouth daily.  Other orders -     Microscopic Examination   Follow up plan: RTC 4 weeks for recheck symptoms Assunta Found, MD [place]

## 2016-10-01 LAB — URINE CULTURE

## 2016-10-04 ENCOUNTER — Telehealth: Payer: Self-pay | Admitting: Family

## 2016-10-04 DIAGNOSIS — B379 Candidiasis, unspecified: Secondary | ICD-10-CM

## 2016-10-06 MED ORDER — FLUCONAZOLE 150 MG PO TABS: 150.0000 mg | ORAL_TABLET | Freq: Once | ORAL | 0 refills | Status: AC

## 2016-10-06 NOTE — Telephone Encounter (Signed)
Please advise on script for diflucan.

## 2016-10-06 NOTE — Telephone Encounter (Signed)
Aware of providers advise and script at pharmacy.

## 2016-10-06 NOTE — Telephone Encounter (Signed)
She had a urinary tract infection and overgrowth of bacteria in the vagina, called bacterial vaginosis. She took antibiotics for the UTI, was taking pro-biotics for the overgrowth of bacteria. I sent in fluconazole for the yeast infection. If symptoms not improving needs to be seen.

## 2016-10-07 ENCOUNTER — Other Ambulatory Visit: Payer: Self-pay | Admitting: *Deleted

## 2016-10-07 DIAGNOSIS — Z1211 Encounter for screening for malignant neoplasm of colon: Secondary | ICD-10-CM

## 2016-10-08 ENCOUNTER — Ambulatory Visit: Payer: Medicare Other

## 2016-10-19 ENCOUNTER — Encounter: Payer: Self-pay | Admitting: Pediatrics

## 2016-10-22 ENCOUNTER — Encounter: Payer: Self-pay | Admitting: Family

## 2016-10-22 ENCOUNTER — Ambulatory Visit (INDEPENDENT_AMBULATORY_CARE_PROVIDER_SITE_OTHER): Payer: Medicare Other | Admitting: Family

## 2016-10-22 VITALS — BP 107/68 | HR 89 | Temp 97.9°F | Ht 61.5 in | Wt 143.8 lb

## 2016-10-22 DIAGNOSIS — M545 Low back pain, unspecified: Secondary | ICD-10-CM

## 2016-10-22 DIAGNOSIS — N898 Other specified noninflammatory disorders of vagina: Secondary | ICD-10-CM

## 2016-10-22 DIAGNOSIS — N3001 Acute cystitis with hematuria: Secondary | ICD-10-CM | POA: Diagnosis not present

## 2016-10-22 LAB — URINALYSIS, COMPLETE
BILIRUBIN UA: NEGATIVE
Glucose, UA: NEGATIVE
KETONES UA: NEGATIVE
NITRITE UA: NEGATIVE
Protein, UA: NEGATIVE
SPEC GRAV UA: 1.025 (ref 1.005–1.030)
UUROB: 0.2 mg/dL (ref 0.2–1.0)
pH, UA: 5.5 (ref 5.0–7.5)

## 2016-10-22 LAB — MICROSCOPIC EXAMINATION
Epithelial Cells (non renal): >10 /hpf — AB (ref 0–10)
WBC, UA: >30 /hpf — AB (ref 0–?)

## 2016-10-22 LAB — WET PREP FOR TRICH, YEAST, CLUE
CLUE CELL EXAM: NEGATIVE
TRICHOMONAS EXAM: NEGATIVE
Yeast Exam: NEGATIVE

## 2016-10-22 MED ORDER — CEFTRIAXONE SODIUM 1 G IJ SOLR
1.0000 g | Freq: Once | INTRAMUSCULAR | Status: AC
  Administered 2016-10-22: 1 g via INTRAMUSCULAR

## 2016-10-22 MED ORDER — SULFAMETHOXAZOLE-TRIMETHOPRIM 800-160 MG PO TABS
1.0000 | ORAL_TABLET | Freq: Two times a day (BID) | ORAL | 0 refills | Status: DC
Start: 2016-10-22 — End: 2016-11-13

## 2016-10-22 NOTE — Patient Instructions (Signed)
Asymptomatic Bacteriuria, Female Asymptomatic bacteriuria is the presence of a large number of bacteria in your urine without the usual symptoms of burning or frequent urination. The following conditions increase the risk of asymptomatic bacteriuria:  Diabetes mellitus.  Advanced age.  Pregnancy in the first trimester.  Kidney stones.  Kidney transplants.  Leaky kidney tube valve in young children (reflux). Treatment for this condition is not needed in most people and can lead to other problems such as too much yeast and growth of resistant bacteria. However, some people, such as pregnant women, do need treatment to prevent kidney infection. Asymptomatic bacteriuria in pregnancy is also associated with fetal growth restriction, premature labor, and newborn death. HOME CARE INSTRUCTIONS Monitor your condition for any changes. The following actions may help to relieve any discomfort you are feeling:  Drink enough water and fluids to keep your urine clear or pale yellow. Go to the bathroom more often to keep your bladder empty.  Keep the area around your vagina and rectum clean. Wipe yourself from front to back after urinating. SEEK IMMEDIATE MEDICAL CARE IF:  You develop signs of an infection such as:  Burning with urination.  Frequency of voiding.  Back pain.  Fever.  You have blood in the urine.  You develop a fever. MAKE SURE YOU:  Understand these instructions.  Will watch your condition.  Will get help right away if you are not doing well or get worse.   This information is not intended to replace advice given to you by your health care provider. Make sure you discuss any questions you have with your health care provider.   Document Released: 12/06/2005 Document Revised: 12/27/2014 Document Reviewed: 05/28/2013 Elsevier Interactive Patient Education 2016 [employer]  

## 2016-10-22 NOTE — Progress Notes (Signed)
   Subjective:    Patient ID: Sandra Adams, female    DOB: [birth year], 50 y.o.   MRN: [id number]  Pt presents to the office today with weakness and left shoulder pain. Pt states this started about 3 months ago and is unchanged. Pt is complaining of lower back that started one week ago. PT denies any dysuria or hematuria. PT states her urine is cloudy and is having frequency. Last time pt was seen I placed an order for gyn referral. Pt states she has been busy taking her father to the doctor and was unable to follow up with gyn yet.  Back Pain  This is a recurrent problem. The current episode started in the past 7 days. The problem occurs intermittently. The quality of the pain is described as aching. The pain is at a severity of 6/10. The pain is mild. Pertinent negatives include no dysuria. Risk factors include menopause.      Review of Systems  Genitourinary: Positive for frequency and urgency. Negative for dysuria.  Musculoskeletal: Positive for arthralgias, back pain and myalgias.  All other systems reviewed and are negative.      Objective:   Physical Exam  Constitutional: She is oriented to person, place, and time. She appears well-developed and well-nourished. No distress.  Cardiovascular: Normal rate, regular rhythm, normal heart sounds and intact distal pulses.   No murmur heard. Pulmonary/Chest: Effort normal and breath sounds normal. No respiratory distress. She has no wheezes.  Abdominal: Soft. Bowel sounds are normal. She exhibits no distension. There is no tenderness.  Musculoskeletal: Normal range of motion. She exhibits no edema or tenderness.  Negative for flank pain.  Neurological: She is alert and oriented to person, place, and time.  Skin: Skin is warm and dry.  Psychiatric: She has a normal mood and affect. Her behavior is normal. Judgment and thought content normal.  Vitals reviewed.     BP 107/68   Pulse 89   Temp 97.9 F (36.6 C) (Oral)   Ht 5' 1.5"  (1.562 m)   Wt 143 lb 12.8 oz (65.2 kg)   LMP 10/16/2014   BMI 26.73 kg/m      Assessment & Plan:  1. Acute bilateral low back pain without sciatica - Urinalysis, Complete  2. Vaginal odor - WET PREP FOR TRICH, YEAST, CLUE  3. Acute cystitis with hematuria -Force fluids AZO over the counter X2 days RTO prn Culture pending -PT needs to call gyn office and make appt asap for chronic UTI Continue probiotics  - Urine culture - cefTRIAXone (ROCEPHIN) injection 1 g; Inject 1 g into the muscle once. - sulfamethoxazole-trimethoprim (BACTRIM DS) 800-160 MG tablet; Take 1 tablet by mouth 2 (two) times daily.  Dispense: 14 tablet; Refill: 0  [name], FNP

## 2016-10-24 LAB — URINE CULTURE

## 2016-10-25 ENCOUNTER — Other Ambulatory Visit: Payer: Self-pay | Admitting: Family

## 2016-10-25 MED ORDER — LEVOFLOXACIN 500 MG PO TABS
500.0000 mg | ORAL_TABLET | Freq: Every day | ORAL | 0 refills | Status: DC
Start: 2016-10-25 — End: 2016-11-13

## 2016-10-28 ENCOUNTER — Ambulatory Visit (INDEPENDENT_AMBULATORY_CARE_PROVIDER_SITE_OTHER): Payer: Medicare Other | Admitting: Pediatrics

## 2016-10-28 ENCOUNTER — Encounter: Payer: Self-pay | Admitting: Pediatrics

## 2016-10-28 VITALS — BP 123/81 | HR 83 | Temp 98.1°F | Ht 61.5 in | Wt 142.4 lb

## 2016-10-28 DIAGNOSIS — R102 Pelvic and perineal pain: Secondary | ICD-10-CM

## 2016-10-28 DIAGNOSIS — N951 Menopausal and female climacteric states: Secondary | ICD-10-CM

## 2016-10-28 DIAGNOSIS — K5900 Constipation, unspecified: Secondary | ICD-10-CM

## 2016-10-28 MED ORDER — GABAPENTIN 300 MG PO CAPS: 300.0000 mg | ORAL_CAPSULE | Freq: Every day | ORAL | 3 refills | Status: DC

## 2016-10-28 NOTE — Progress Notes (Signed)
  Subjective:   Patient ID: Sandra Adams, female    DOB: 1966-11-18, 50 y.o.   MRN: [id number] CC: Follow-up (4 week) multiple med problems HPI: Sandra Adams is a 50 y.o. female presenting for Follow-up (4 week)  Menopause: started on lexapro 4 weeks ago Gave her stomach ache Stopped it Abdominal pain improved Was followed at [facility] for fibroids She thinks is getting worse Has lower abd pain/pressure that comes and goes Is having more frequent UTIs past few months  Continues to have hot flashes at night Has a hard time sleeping Tried on SSRIs, lexapro, celexa, other medications she says in the past, possibly clonidine but she isnt sure, poorly tolerated or didn't help  Constipation: comes and goes Has not tried any OTC medications Has stools every few days  Relevant past medical, surgical, family and social history reviewed. Allergies and medications reviewed and updated. History  Smoking Status  . Never Smoker  Smokeless Tobacco  . Never Used   ROS: Per HPI   Objective:    BP 123/81   Pulse 83   Temp 98.1 F (36.7 C) (Oral)   Ht 5' 1.5" (1.562 m)   Wt 142 lb 6.4 oz (64.6 kg)   LMP 10/16/2014   BMI 26.47 kg/m   Wt Readings from Last 3 Encounters:  10/28/16 142 lb 6.4 oz (64.6 kg)  10/22/16 143 lb 12.8 oz (65.2 kg)  09/30/16 141 lb 9.6 oz (64.2 kg)    Gen: NAD, alert, cooperative with exam, NCAT EYES: EOMI, no conjunctival injection, or no icterus ENT:  OP without erythema LYMPH: no cervical LAD CV: NRRR, normal S1/S2, no murmur, distal pulses 2+ b/l Resp: CTABL, no wheezes, normal WOB Abd:  soft, mildly tender L side with deep palpation, ND. no guarding or organomegaly Ext: No edema, warm Neuro: Alert and oriented MSK: normal muscle bulk  Assessment & Plan:  Sandra Adams was seen today for follow-up multiple med problems.  Diagnoses and all orders for this visit:  Pelvic pressure in female Having increasing number of UTIs as well -      Ambulatory referral to Gynecology  Hot flashes due to menopause Failed SSRIs Trial QHS gabapentin -     gabapentin (NEURONTIN) 300 MG capsule; Take 1 capsule (300 mg total) by mouth at bedtime.  Constipation, unspecified constipation type Tramadol likely worsening problem Start miralax if no stool in 24h  Follow up plan: Return in about 4 weeks (around 11/25/2016) for with Christy Hawks/PCP for CPE. Sandra Found, MD [place]

## 2016-11-05 ENCOUNTER — Encounter: Payer: Medicare Other | Admitting: Obstetrics & Gynecology

## 2016-11-08 ENCOUNTER — Encounter: Payer: Medicare Other | Admitting: Obstetrics and Gynecology

## 2016-11-08 ENCOUNTER — Telehealth: Payer: Self-pay | Admitting: Family

## 2016-11-08 NOTE — Telephone Encounter (Signed)
Patient did not go to her OB-Gyn appointment at [facility] this morning.  She was advised to call their office to schedule an appointment for follow up care,

## 2016-11-12 DIAGNOSIS — H04123 Dry eye syndrome of bilateral lacrimal glands: Secondary | ICD-10-CM | POA: Diagnosis not present

## 2016-11-13 ENCOUNTER — Encounter: Payer: Self-pay | Admitting: Family Medicine

## 2016-11-13 ENCOUNTER — Other Ambulatory Visit: Payer: Self-pay | Admitting: Family

## 2016-11-13 ENCOUNTER — Ambulatory Visit (INDEPENDENT_AMBULATORY_CARE_PROVIDER_SITE_OTHER): Payer: Medicare Other | Admitting: Family Medicine

## 2016-11-13 VITALS — BP 124/75 | HR 83 | Temp 97.5°F | Ht 61.5 in | Wt 143.0 lb

## 2016-11-13 DIAGNOSIS — F411 Generalized anxiety disorder: Secondary | ICD-10-CM

## 2016-11-13 DIAGNOSIS — M545 Low back pain, unspecified: Secondary | ICD-10-CM

## 2016-11-13 DIAGNOSIS — M5442 Lumbago with sciatica, left side: Secondary | ICD-10-CM

## 2016-11-13 DIAGNOSIS — N3000 Acute cystitis without hematuria: Secondary | ICD-10-CM | POA: Diagnosis not present

## 2016-11-13 DIAGNOSIS — R300 Dysuria: Secondary | ICD-10-CM | POA: Diagnosis not present

## 2016-11-13 MED ORDER — NITROFURANTOIN MONOHYD MACRO 100 MG PO CAPS: 100.0000 mg | ORAL_CAPSULE | Freq: Two times a day (BID) | ORAL | 0 refills | Status: DC

## 2016-11-13 NOTE — Progress Notes (Signed)
BP 124/75   Pulse 83   Temp 97.5 F (36.4 C) (Oral)   Ht 5' 1.5" (1.562 m)   Wt 143 lb (64.9 kg)   LMP 10/16/2014   BMI 26.58 kg/m    Subjective:    Patient ID: [name], female    DOB: 1966/12/18, 50 y.o.   MRN: [id number]  HPI: Sandra DILAURA is a 50 y.o. female presenting on 11/13/2016 for burning with urination   HPI Burning with urination and suprapubic abdominal pain Patient is having burning with urination and suprapubic abdominal pain that's been going on for the past week and this time. She was treated for urinary tract infection earlier this month as well. She has these very frequently and has been referred to gynecology to see what may or may not be going on but has not gone yet. She denies any fevers or chills or flank tenderness.  Relevant past medical, surgical, family and social history reviewed and updated as indicated. Interim medical history since our last visit reviewed. Allergies and medications reviewed and updated.  Review of Systems  Constitutional: Negative for chills and fever.  Eyes: Negative for redness and visual disturbance.  Respiratory: Negative for chest tightness and shortness of breath.   Cardiovascular: Negative for chest pain and leg swelling.  Gastrointestinal: Positive for abdominal pain. Negative for constipation, diarrhea, nausea and vomiting.  Genitourinary: Positive for dysuria, frequency and urgency. Negative for decreased urine volume, difficulty urinating, flank pain, menstrual problem, pelvic pain, vaginal bleeding, vaginal discharge and vaginal pain.  Musculoskeletal: Negative for back pain and gait problem.  Skin: Negative for rash.  Neurological: Negative for light-headedness and headaches.  Psychiatric/Behavioral: Negative for agitation and behavioral problems.  All other systems reviewed and are negative.   Per HPI unless specifically indicated above      Objective:    BP 124/75   Pulse 83   Temp 97.5 F  (36.4 C) (Oral)   Ht 5' 1.5" (1.562 m)   Wt 143 lb (64.9 kg)   LMP 10/16/2014   BMI 26.58 kg/m   Wt Readings from Last 3 Encounters:  11/13/16 143 lb (64.9 kg)  10/28/16 142 lb 6.4 oz (64.6 kg)  10/22/16 143 lb 12.8 oz (65.2 kg)    Physical Exam  Constitutional: She is oriented to person, place, and time. She appears well-developed and well-nourished. No distress.  Eyes: Conjunctivae are normal.  Cardiovascular: Normal rate, regular rhythm, normal heart sounds and intact distal pulses.   No murmur heard. Pulmonary/Chest: Effort normal and breath sounds normal. No respiratory distress. She has no wheezes. She has no rales.  Abdominal: Soft. Bowel sounds are normal. She exhibits no distension. There is tenderness (Mild suprapubic abdominal tenderness, no CVA tenderness). There is no rebound and no guarding.  Musculoskeletal: Normal range of motion. She exhibits no edema or tenderness.  Neurological: She is alert and oriented to person, place, and time. Coordination normal.  Skin: Skin is warm and dry. No rash noted. She is not diaphoretic.  Psychiatric: She has a normal mood and affect. Her behavior is normal.  Nursing note and vitals reviewed.   Urinalysis: 2+ blood and 2+ leukocytes, otherwise normal    Assessment & Plan:   Problem List Items Addressed This Visit    None    Visit Diagnoses    Acute cystitis without hematuria    -  Primary   Relevant Medications   nitrofurantoin, macrocrystal-monohydrate, (MACROBID) 100 MG capsule   Other Relevant Orders  Urinalysis       Follow up plan: Return if symptoms worsen or fail to improve.  Counseling provided for all of the vaccine components Orders Placed This Encounter  Procedures  . Urinalysis    Caryl Pina, MD [facility] 11/13/2016, 9:17 AM

## 2016-11-15 LAB — URINALYSIS
Bilirubin, UA: NEGATIVE
Glucose, UA: NEGATIVE
KETONES UA: NEGATIVE
NITRITE UA: NEGATIVE
Protein, UA: NEGATIVE
SPEC GRAV UA: 1.025 (ref 1.005–1.030)
UUROB: 0.2 mg/dL (ref 0.2–1.0)
pH, UA: 5 (ref 5.0–7.5)

## 2016-11-16 ENCOUNTER — Encounter: Payer: Self-pay | Admitting: Gastroenterology

## 2016-12-02 DIAGNOSIS — N6001 Solitary cyst of right breast: Secondary | ICD-10-CM | POA: Diagnosis not present

## 2016-12-10 ENCOUNTER — Other Ambulatory Visit: Payer: Self-pay | Admitting: Family

## 2016-12-10 DIAGNOSIS — M545 Low back pain, unspecified: Secondary | ICD-10-CM

## 2016-12-14 ENCOUNTER — Other Ambulatory Visit: Payer: Self-pay | Admitting: Pediatrics

## 2016-12-14 ENCOUNTER — Telehealth: Payer: Self-pay | Admitting: Family

## 2016-12-14 NOTE — Telephone Encounter (Signed)
appt made

## 2016-12-16 ENCOUNTER — Encounter: Payer: Self-pay | Admitting: Pediatrics

## 2016-12-16 ENCOUNTER — Ambulatory Visit (INDEPENDENT_AMBULATORY_CARE_PROVIDER_SITE_OTHER): Payer: Medicare Other | Admitting: Pediatrics

## 2016-12-16 VITALS — BP 124/76 | HR 74 | Temp 97.6°F | Ht 61.5 in | Wt 145.8 lb

## 2016-12-16 DIAGNOSIS — R102 Pelvic and perineal pain: Secondary | ICD-10-CM | POA: Diagnosis not present

## 2016-12-16 DIAGNOSIS — N3000 Acute cystitis without hematuria: Secondary | ICD-10-CM | POA: Diagnosis not present

## 2016-12-16 DIAGNOSIS — R399 Unspecified symptoms and signs involving the genitourinary system: Secondary | ICD-10-CM

## 2016-12-16 LAB — URINALYSIS, COMPLETE
BILIRUBIN UA: NEGATIVE
GLUCOSE, UA: NEGATIVE
KETONES UA: NEGATIVE
Nitrite, UA: NEGATIVE
PROTEIN UA: NEGATIVE
SPEC GRAV UA: 1.01 (ref 1.005–1.030)
Urobilinogen, Ur: 0.2 mg/dL (ref 0.2–1.0)
pH, UA: 7 (ref 5.0–7.5)

## 2016-12-16 LAB — MICROSCOPIC EXAMINATION: RENAL EPITHEL UA: NONE SEEN /HPF

## 2016-12-16 LAB — WET PREP FOR TRICH, YEAST, CLUE
CLUE CELL EXAM: NEGATIVE
Trichomonas Exam: NEGATIVE
Yeast Exam: NEGATIVE

## 2016-12-16 MED ORDER — NITROFURANTOIN MONOHYD MACRO 100 MG PO CAPS: 100.0000 mg | ORAL_CAPSULE | Freq: Two times a day (BID) | ORAL | 0 refills | Status: DC

## 2016-12-16 NOTE — Progress Notes (Signed)
  Subjective:   Patient ID: Sandra Adams, female    DOB: [birth year], 50 y.o.   MRN: [id number] CC: Urinary Frequency and Urinary Urgency  HPI: Sandra Adams is a 50 y.o. female presenting for Urinary Frequency and Urinary Urgency  Has had symptoms for about two weeks Has been going frequently Has been drinking mostly tea and coke No fevers abd pain is in her lower abd, goes to her back when it is there  Has had vaginal discharge for about 6 days No smell or odor, slightly more than usual  Feels like she cant completely empty her bladder Has dribbling sometimes but not all the time Says she has fibroids, that she was supposed to be seen by gynecology but has not yet followed up with them She did have two small fibroids on pelvic u/s in 2015 Continues to feel intermittent pressure in her pelvis    Relevant past medical, surgical, family and social history reviewed. Allergies and medications reviewed and updated. History  Smoking Status  . Never Smoker  Smokeless Tobacco  . Never Used   ROS: Per HPI   Objective:    BP 124/76   Pulse 74   Temp 97.6 F (36.4 C) (Oral)   Ht 5' 1.5" (1.562 m)   Wt 145 lb 12.8 oz (66.1 kg)   LMP 10/16/2014   BMI 27.10 kg/m   Wt Readings from Last 3 Encounters:  12/16/16 145 lb 12.8 oz (66.1 kg)  11/13/16 143 lb (64.9 kg)  10/28/16 142 lb 6.4 oz (64.6 kg)    Gen: NAD, alert, cooperative with exam, NCAT EYES: EOMI, no conjunctival injection, or no icterus CV: NRRR, normal S1/S2, no murmur, distal pulses 2+ b/l Resp: CTABL, no wheezes, normal WOB Abd: +BS, soft, mildly TTP lower abd, ND. no guarding or organomegaly Ext: No edema, warm Neuro: Alert and oriented MSK: normal muscle bulk  Assessment & Plan:  Sandra Adams was seen today for urinary frequency and urinary urgency.  Diagnoses and all orders for this visit:  Pelvic pain Wet prep normal If ongoing pain, discharge next visit, needs repeat wet prep/GC/chlamydia screen -      WET PREP FOR [place], YEAST, [place] -     Ambulatory referral to Gynecology  Urinary symptom or sign Slightly Positive UA, given symptoms will treat as below -     Urinalysis, Complete -     Urine culture  Acute cystitis without hematuria If continues to have RBCs in urine after cystitis resolves, needs to be seen by urology -     nitrofurantoin, macrocrystal-monohydrate, (MACROBID) 100 MG capsule; Take 1 capsule (100 mg total) by mouth 2 (two) times daily.  Other orders -     Microscopic Examination   Follow up plan: Return in about 2 weeks (around 12/30/2016) for complete physical exam with Christy. Assunta Found, MD [place]

## 2016-12-16 NOTE — Patient Instructions (Signed)
Avoid vaginal lubricants when you can as this can increase risk of urinary tract infection

## 2016-12-29 ENCOUNTER — Other Ambulatory Visit: Payer: Self-pay | Admitting: Family Medicine

## 2016-12-29 ENCOUNTER — Encounter: Payer: Medicare Other | Admitting: Obstetrics and Gynecology

## 2016-12-29 DIAGNOSIS — F411 Generalized anxiety disorder: Secondary | ICD-10-CM

## 2016-12-30 ENCOUNTER — Other Ambulatory Visit: Payer: Self-pay | Admitting: *Deleted

## 2016-12-30 DIAGNOSIS — M545 Low back pain, unspecified: Secondary | ICD-10-CM

## 2016-12-30 DIAGNOSIS — M5442 Lumbago with sciatica, left side: Secondary | ICD-10-CM

## 2016-12-30 MED ORDER — IBUPROFEN 600 MG PO TABS: 600.0000 mg | ORAL_TABLET | Freq: Three times a day (TID) | ORAL | 1 refills | Status: DC | PRN

## 2016-12-30 NOTE — Telephone Encounter (Signed)
PT needs appt for pain contract

## 2016-12-30 NOTE — Telephone Encounter (Signed)
Spoke to the pharmacist and they will inform pt that she needs an appt to have Tramadol refilled.

## 2016-12-30 NOTE — Telephone Encounter (Signed)
rx called into pharmacy and they will let pt know.

## 2016-12-30 NOTE — Telephone Encounter (Signed)
Last seen for medication refill 07/02/2016. Last filled 09/13/2016

## 2016-12-31 ENCOUNTER — Encounter: Payer: Self-pay | Admitting: Family

## 2016-12-31 ENCOUNTER — Ambulatory Visit (INDEPENDENT_AMBULATORY_CARE_PROVIDER_SITE_OTHER): Payer: Medicare Other | Admitting: Family

## 2016-12-31 VITALS — BP 103/61 | HR 79 | Temp 98.7°F | Ht 61.5 in | Wt 143.0 lb

## 2016-12-31 DIAGNOSIS — E663 Overweight: Secondary | ICD-10-CM | POA: Diagnosis not present

## 2016-12-31 DIAGNOSIS — Z79891 Long term (current) use of opiate analgesic: Secondary | ICD-10-CM

## 2016-12-31 DIAGNOSIS — K219 Gastro-esophageal reflux disease without esophagitis: Secondary | ICD-10-CM | POA: Diagnosis not present

## 2016-12-31 DIAGNOSIS — Z0289 Encounter for other administrative examinations: Secondary | ICD-10-CM

## 2016-12-31 DIAGNOSIS — Z79899 Other long term (current) drug therapy: Secondary | ICD-10-CM | POA: Insufficient documentation

## 2016-12-31 DIAGNOSIS — M545 Low back pain: Secondary | ICD-10-CM

## 2016-12-31 DIAGNOSIS — F411 Generalized anxiety disorder: Secondary | ICD-10-CM

## 2016-12-31 DIAGNOSIS — G8929 Other chronic pain: Secondary | ICD-10-CM | POA: Diagnosis not present

## 2016-12-31 DIAGNOSIS — F1129 Opioid dependence with unspecified opioid-induced disorder: Secondary | ICD-10-CM | POA: Insufficient documentation

## 2016-12-31 DIAGNOSIS — M5442 Lumbago with sciatica, left side: Secondary | ICD-10-CM | POA: Diagnosis not present

## 2016-12-31 DIAGNOSIS — F331 Major depressive disorder, recurrent, moderate: Secondary | ICD-10-CM | POA: Diagnosis not present

## 2016-12-31 DIAGNOSIS — G43009 Migraine without aura, not intractable, without status migrainosus: Secondary | ICD-10-CM | POA: Diagnosis not present

## 2016-12-31 DIAGNOSIS — M549 Dorsalgia, unspecified: Secondary | ICD-10-CM

## 2016-12-31 MED ORDER — ALPRAZOLAM 0.25 MG PO TABS: ORAL_TABLET | ORAL | 5 refills | Status: DC

## 2016-12-31 MED ORDER — IBUPROFEN 600 MG PO TABS: 600.0000 mg | ORAL_TABLET | Freq: Three times a day (TID) | ORAL | 2 refills | Status: DC | PRN

## 2016-12-31 MED ORDER — TRAMADOL-ACETAMINOPHEN 37.5-325 MG PO TABS: ORAL_TABLET | ORAL | 2 refills | Status: DC

## 2016-12-31 NOTE — Patient Instructions (Signed)
Chronic Back Pain When back pain lasts longer than 3 months, it is called chronic back pain.The cause of your back pain may not be known. Some common causes include:  Wear and tear (degenerative disease) of the bones, ligaments, or disks in your back.  Inflammation and stiffness in your back (arthritis). People who have chronic back pain often go through certain periods in which the pain is more intense (flare-ups). Many people can learn to manage the pain with home care. Follow these instructions at home: Pay attention to any changes in your symptoms. Take these actions to help with your pain: Activity   Avoid bending and activities that make the problem worse.  Do not sit or stand in one place for long periods of time.  Take brief periods of rest throughout the day. This will reduce your pain. Resting in a lying or standing position is usually better than sitting to rest.  When you are resting for longer periods, mix in some mild activity or stretching between periods of rest. This will help to prevent stiffness and pain.  Get regular exercise. Ask your health care provider what activities are safe for you.  Do not lift anything that is heavier than 10 lb (4.5 kg). Always use proper lifting technique, which includes:  Bending your knees.  Keeping the load close to your body.  Avoiding twisting. Managing pain   If directed, apply ice to the painful area. Your health care provider may recommend applying ice during the first 24-48 hours after a flare-up begins.  Put ice in a plastic bag.  Place a towel between your skin and the bag.  Leave the ice on for 20 minutes, 2-3 times per day.  After icing, apply heat to the affected area as often as told by your health care provider. Use the heat source that your health care provider recommends, such as a moist heat pack or a heating pad.  Place a towel between your skin and the heat source.  Leave the heat on for 20-30  minutes.  Remove the heat if your skin turns bright red. This is especially important if you are unable to feel pain, heat, or cold. You may have a greater risk of getting burned.  Try soaking in a warm tub.  Take over-the-counter and prescription medicines only as told by your health care provider.  Keep all follow-up visits as told by your health care provider. This is important. Contact a health care provider if:  You have pain that is not relieved with rest or medicine. Get help right away if:  You have weakness or numbness in one or both of your legs or feet.  You have trouble controlling your bladder or your bowels.  You have nausea or vomiting.  You have pain in your abdomen.  You have shortness of breath or you faint. This information is not intended to replace advice given to you by your health care provider. Make sure you discuss any questions you have with your health care provider. Document Released: 01/13/2005 Document Revised: 04/15/2016 Document Reviewed: 05/26/2015 Elsevier Interactive Patient Education  2017 [employer]  

## 2016-12-31 NOTE — Progress Notes (Signed)
Subjective:    Patient ID: Sandra Adams, female    DOB: 11-25-1966, 51 y.o.   MRN: [id number]  Pt presents to the office today for chronic follow up. Pt has pap scheduled with GYN in Feb 2018. Gastroesophageal Reflux  She reports no dysphagia, no heartburn or no wheezing. This is a chronic problem. The current episode started more than 1 year ago. The problem occurs frequently. The problem has been waxing and waning. The heartburn wakes her from sleep. The symptoms are aggravated by certain foods and lying down. She has tried a diet change and a PPI for the symptoms. The treatment provided moderate relief.  Anxiety  Presents for follow-up visit. Symptoms include depressed mood, excessive worry, feeling of choking, nervous/anxious behavior and restlessness. Patient reports no irritability, palpitations, panic, shortness of breath or suicidal ideas. Symptoms occur occasionally. The quality of sleep is poor.    Depression         This is a chronic problem.  The current episode started more than 1 year ago.   The onset quality is gradual.   The problem occurs intermittently.  The problem has been waxing and waning since onset.  Associated symptoms include hopelessness, restlessness, myalgias and sad.  Associated symptoms include no suicidal ideas.  Past treatments include SNRIs - Serotonin and norepinephrine reuptake inhibitors.  Compliance with treatment is variable.  Past medical history includes anxiety.   Back Pain  This is a chronic problem. The current episode started more than 1 year ago. The problem occurs intermittently. The problem has been waxing and waning since onset. The pain is present in the lumbar spine. The quality of the pain is described as aching. The pain is at a severity of 8/10. The pain is moderate. Pertinent negatives include no bladder incontinence, bowel incontinence or leg pain. Risk factors include menopause. She has tried bed rest, muscle relaxant and NSAIDs for the  symptoms. The treatment provided mild relief.  Migraine   This is a chronic problem. The current episode started more than 1 year ago. The problem has been resolved. The pain quality is similar to prior headaches. Associated symptoms include back pain.   Pain assessment: Cause of pain- Chronic lower back pain Pain location- lower Pain on scale of 1-10- 8 Frequency- Intermittet What increases pain-standing  Current medications- Ultracet 37.5-325 mg, 2 tabs every 8 hours as needed # 30, motrin 600 mg as needed Effectiveness of current meds-stable  Pill count performed-No Urine drug screen- Yes Was the [place] reviewed- Yes  If yes were their any concerning findings? -Pt has only received to controlled substance from providers from this office.     Review of Systems  Constitutional: Negative for irritability.  HENT: Negative.   Eyes: Negative.   Respiratory: Negative.  Negative for shortness of breath and wheezing.   Cardiovascular: Negative.  Negative for palpitations.  Gastrointestinal: Negative for bowel incontinence, dysphagia and heartburn.  Endocrine: Negative.   Genitourinary: Negative.  Negative for bladder incontinence.  Musculoskeletal: Positive for back pain and myalgias.  Neurological: Negative.   Hematological: Negative.   Psychiatric/Behavioral: Positive for depression. Negative for suicidal ideas. The patient is nervous/anxious.   All other systems reviewed and are negative.      Objective:   Physical Exam  Constitutional: She is oriented to person, place, and time. She appears well-developed and well-nourished. No distress.  HENT:  Head: Normocephalic and atraumatic.  Right Ear: External ear normal.  Left Ear: External ear normal.  Nose:  Nose normal.  Mouth/Throat: Oropharynx is clear and moist.  Eyes: Pupils are equal, round, and reactive to light.  Neck: Normal range of motion. Neck supple. No thyromegaly present.  Cardiovascular: Normal rate, regular  rhythm, normal heart sounds and intact distal pulses.   No murmur heard. Pulmonary/Chest: Effort normal and breath sounds normal. No respiratory distress. She has no wheezes.  Abdominal: Soft. Bowel sounds are normal. She exhibits no distension. There is no tenderness.  Musculoskeletal: Normal range of motion. She exhibits no edema or tenderness.     Neurological: She is alert and oriented to person, place, and time. She has normal reflexes. No cranial nerve deficit.  Skin: Skin is warm and dry.  Psychiatric: She has a normal mood and affect. Her behavior is normal. Judgment and thought content normal.  Vitals reviewed.    BP 103/61   Pulse 79   Temp 98.7 F (37.1 C) (Oral)   Ht 5' 1.5" (1.562 m)   Wt 143 lb (64.9 kg)   LMP 10/16/2014   BMI 26.58 kg/m       Assessment & Plan:  1. Migraine without aura and without status migrainosus, not intractable - CMP14+EGFR  2. Gastroesophageal reflux disease without esophagitis - CMP14+EGFR  3. Overweight (BMI 25.0-29.9) - CMP14+EGFR  4. GAD (generalized anxiety disorder) - CMP14+EGFR  5. Moderate episode of recurrent major depressive disorder (HCC) - CMP14+EGFR  6. Chronic bilateral low back pain without sciatica - CMP14+EGFR - ToxASSURE Select 13 (MW), Urine  7. Generalized anxiety disorder - ALPRAZolam (XANAX) 0.25 MG tablet; TAKE 1 TABLET AT BEDTIME AS NEEDED FOR ANXIETY  Dispense: 30 tablet; Refill: 5 - CMP14+EGFR  8. Left-sided low back pain with left-sided sciatica, unspecified chronicity - ibuprofen (ADVIL,MOTRIN) 600 MG tablet; Take 1 tablet (600 mg total) by mouth every 8 (eight) hours as needed for mild pain or moderate pain.  Dispense: 30 tablet; Refill: 2 - CMP14+EGFR  9. Pain medication agreement signed - ToxASSURE Select 13 (MW), Urine  10. Opioid dependence with opioid-induced disorder ([place]) - ToxASSURE Select 13 (MW), Urine  11. Long term (current) use of opiate analgesic - ToxASSURE Select 13 (MW),  Urine   Continue all meds Labs pending Health Maintenance reviewed Diet and exercise encouraged RTO 3 months and keep GYN appt!!!!  [name], FNP

## 2017-01-01 LAB — CMP14+EGFR
ALBUMIN: 4.7 g/dL (ref 3.5–5.5)
ALT: 37 IU/L — ABNORMAL HIGH (ref 0–32)
AST: 24 IU/L (ref 0–40)
Albumin/Globulin Ratio: 1.6 (ref 1.2–2.2)
Alkaline Phosphatase: 120 IU/L — ABNORMAL HIGH (ref 39–117)
BUN/Creatinine Ratio: 12 (ref 9–23)
BUN: 9 mg/dL (ref 6–24)
Bilirubin Total: 0.3 mg/dL (ref 0.0–1.2)
CALCIUM: 9.9 mg/dL (ref 8.7–10.2)
CO2: 25 mmol/L (ref 18–29)
CREATININE: 0.76 mg/dL (ref 0.57–1.00)
Chloride: 102 mmol/L (ref 96–106)
GFR calc Af Amer: 104 mL/min/{1.73_m2} (ref >59)
GFR, EST NON AFRICAN AMERICAN: 90 mL/min/{1.73_m2} (ref >59)
GLOBULIN, TOTAL: 3 g/dL (ref 1.5–4.5)
GLUCOSE: 93 mg/dL (ref 65–99)
Potassium: 4.3 mmol/L (ref 3.5–5.2)
SODIUM: 143 mmol/L (ref 134–144)
Total Protein: 7.7 g/dL (ref 6.0–8.5)

## 2017-01-05 ENCOUNTER — Ambulatory Visit: Payer: Self-pay | Admitting: Physician Assistant

## 2017-01-06 ENCOUNTER — Ambulatory Visit: Payer: Self-pay | Admitting: Gastroenterology

## 2017-01-06 LAB — TOXASSURE SELECT 13 (MW), URINE

## 2017-01-10 DIAGNOSIS — N3001 Acute cystitis with hematuria: Secondary | ICD-10-CM | POA: Diagnosis not present

## 2017-01-10 DIAGNOSIS — R399 Unspecified symptoms and signs involving the genitourinary system: Secondary | ICD-10-CM | POA: Diagnosis not present

## 2017-01-13 ENCOUNTER — Ambulatory Visit (INDEPENDENT_AMBULATORY_CARE_PROVIDER_SITE_OTHER): Payer: Medicare Other | Admitting: Family

## 2017-01-13 ENCOUNTER — Encounter: Payer: Self-pay | Admitting: Family

## 2017-01-13 VITALS — BP 110/75 | HR 95 | Temp 97.8°F | Ht 61.5 in | Wt 147.0 lb

## 2017-01-13 DIAGNOSIS — G43009 Migraine without aura, not intractable, without status migrainosus: Secondary | ICD-10-CM

## 2017-01-13 MED ORDER — KETOROLAC TROMETHAMINE 60 MG/2ML IM SOLN
60.0000 mg | Freq: Once | INTRAMUSCULAR | Status: AC
  Administered 2017-01-13: 60 mg via INTRAMUSCULAR

## 2017-01-13 NOTE — Progress Notes (Signed)
   Subjective:    Patient ID: Sandra Adams, female    DOB: [birth year], 51 y.o.   MRN: [id number]  Headache   This is a recurrent problem. The current episode started yesterday. The problem occurs constantly. The problem has been unchanged. The pain is located in the left unilateral region. The pain quality is similar to prior headaches. The quality of the pain is described as aching. The pain is at a severity of 5/10. The pain is moderate. Associated symptoms include nausea, phonophobia and photophobia. Pertinent negatives include no ear pain, eye pain, fever, loss of balance, muscle aches, sinus pressure, sore throat or vomiting. The symptoms are aggravated by emotional stress. She has tried NSAIDs and Excedrin for the symptoms. The treatment provided mild relief. Her past medical history is significant for migraine headaches.      Review of Systems  Constitutional: Negative for fever.  HENT: Negative for ear pain, sinus pressure and sore throat.   Eyes: Positive for photophobia. Negative for pain.  Gastrointestinal: Positive for nausea. Negative for vomiting.  Neurological: Positive for headaches. Negative for loss of balance.  All other systems reviewed and are negative.      Objective:   Physical Exam  Constitutional: She is oriented to person, place, and time. She appears well-developed and well-nourished. No distress.  HENT:  Head: Normocephalic and atraumatic.  Mouth/Throat: Oropharynx is clear and moist.  Eyes: Pupils are equal, round, and reactive to light.  Neck: Normal range of motion. Neck supple. No thyromegaly present.  Cardiovascular: Normal rate, regular rhythm, normal heart sounds and intact distal pulses.   No murmur heard. Pulmonary/Chest: Effort normal and breath sounds normal. No respiratory distress. She has no wheezes.  Abdominal: Soft. Bowel sounds are normal. She exhibits no distension. There is no tenderness.  Musculoskeletal: Normal range of motion. She  exhibits no edema or tenderness.  Neurological: She is alert and oriented to person, place, and time.  Skin: Skin is warm and dry.  Psychiatric: She has a normal mood and affect. Her behavior is normal. Judgment and thought content normal.  Vitals reviewed.    BP 110/75   Pulse 95   Temp 97.8 F (36.6 C) (Oral)   Ht 5' 1.5" (1.562 m)   Wt 147 lb (66.7 kg)   LMP 10/16/2014   BMI 27.33 kg/m       Assessment & Plan:  1. Migraine without aura and without status migrainosus, not intractable Stress management discussed Force fluids Rest -RTO prn or if changes is vision, gait, or speech go to ED - ketorolac (TORADOL) injection 60 mg; Inject 2 mLs (60 mg total) into the muscle once.   [name], FNP

## 2017-01-13 NOTE — Patient Instructions (Signed)

## 2017-01-18 ENCOUNTER — Other Ambulatory Visit: Payer: Self-pay | Admitting: Family

## 2017-01-18 DIAGNOSIS — F411 Generalized anxiety disorder: Secondary | ICD-10-CM

## 2017-01-26 ENCOUNTER — Encounter: Payer: Medicare Other | Admitting: Obstetrics and Gynecology

## 2017-01-28 ENCOUNTER — Other Ambulatory Visit (HOSPITAL_COMMUNITY)
Admission: RE | Admit: 2017-01-28 | Discharge: 2017-01-28 | Disposition: A | Discharge status: Home / Self Care | Payer: Medicare Other | Source: Ambulatory Visit | Attending: Obstetrics and Gynecology | Admitting: Obstetrics and Gynecology

## 2017-01-28 ENCOUNTER — Encounter: Payer: Self-pay | Admitting: Obstetrics and Gynecology

## 2017-01-28 ENCOUNTER — Ambulatory Visit (INDEPENDENT_AMBULATORY_CARE_PROVIDER_SITE_OTHER): Payer: Medicare Other | Admitting: Obstetrics and Gynecology

## 2017-01-28 VITALS — BP 130/80 | HR 80 | Wt 144.2 lb

## 2017-01-28 DIAGNOSIS — N951 Menopausal and female climacteric states: Secondary | ICD-10-CM | POA: Diagnosis not present

## 2017-01-28 DIAGNOSIS — Z01419 Encounter for gynecological examination (general) (routine) without abnormal findings: Secondary | ICD-10-CM | POA: Insufficient documentation

## 2017-01-28 DIAGNOSIS — R350 Frequency of micturition: Secondary | ICD-10-CM

## 2017-01-28 DIAGNOSIS — Z124 Encounter for screening for malignant neoplasm of cervix: Secondary | ICD-10-CM

## 2017-01-28 DIAGNOSIS — Z01411 Encounter for gynecological examination (general) (routine) with abnormal findings: Secondary | ICD-10-CM | POA: Diagnosis not present

## 2017-01-28 LAB — POCT URINALYSIS DIPSTICK
Glucose, UA: NEGATIVE
KETONES UA: NEGATIVE
Leukocytes, UA: NEGATIVE
Nitrite, UA: NEGATIVE
PROTEIN UA: NEGATIVE

## 2017-01-28 MED ORDER — ESTRADIOL 1 MG PO TABS: 1.0000 mg | ORAL_TABLET | Freq: Every day | ORAL | 11 refills | Status: DC

## 2017-01-28 NOTE — Patient Instructions (Signed)
Menopause Menopause is the normal time of life when menstrual periods stop completely. Menopause is complete when you have missed 12 consecutive menstrual periods. It usually occurs between the ages of 48 years and 55 years. Very rarely does a woman develop menopause before the age of 40 years. At menopause, your ovaries stop producing the female hormones estrogen and progesterone. This can cause undesirable symptoms and also affect your health. Sometimes the symptoms may occur 4-5 years before the menopause begins. There is no relationship between menopause and:  Oral contraceptives.  Number of children you had.  Race.  The age your menstrual periods started (menarche).  Heavy smokers and very thin women may develop menopause earlier in life. What are the causes?  The ovaries stop producing the female hormones estrogen and progesterone. Other causes include:  Surgery to remove both ovaries.  The ovaries stop functioning for no known reason.  Tumors of the pituitary gland in the brain.  Medical disease that affects the ovaries and hormone production.  Radiation treatment to the abdomen or pelvis.  Chemotherapy that affects the ovaries.  What are the signs or symptoms?  Hot flashes.  Night sweats.  Decrease in sex drive.  Vaginal dryness and thinning of the vagina causing painful intercourse.  Dryness of the skin and developing wrinkles.  Headaches.  Tiredness.  Irritability.  Memory problems.  Weight gain.  Bladder infections.  Hair growth of the face and chest.  Infertility. More serious symptoms include:  Loss of bone (osteoporosis) causing breaks (fractures).  Depression.  Hardening and narrowing of the arteries (atherosclerosis) causing heart attacks and strokes.  How is this diagnosed?  When the menstrual periods have stopped for 12 straight months.  Physical exam.  Hormone studies of the blood. How is this treated? There are many treatment  choices and nearly as many questions about them. The decisions to treat or not to treat menopausal changes is an individual choice made with your health care provider. Your health care provider can discuss the treatments with you. Together, you can decide which treatment will work best for you. Your treatment choices may include:  Hormone therapy (estrogen and progesterone).  Non-hormonal medicines.  Treating the individual symptoms with medicine (for example antidepressants for depression).  Herbal medicines that may help specific symptoms.  Counseling by a psychiatrist or psychologist.  Group therapy.  Lifestyle changes including: ? Eating healthy. ? Regular exercise. ? Limiting caffeine and alcohol. ? Stress management and meditation.  No treatment.  Follow these instructions at home:  Take the medicine your health care provider gives you as directed.  Get plenty of sleep and rest.  Exercise regularly.  Eat a diet that contains calcium (good for the bones) and soy products (acts like estrogen hormone).  Avoid alcoholic beverages.  Do not smoke.  If you have hot flashes, dress in layers.  Take supplements, calcium, and vitamin D to strengthen bones.  You can use over-the-counter lubricants or moisturizers for vaginal dryness.  Group therapy is sometimes very helpful.  Acupuncture may be helpful in some cases. Contact a health care provider if:  You are not sure you are in menopause.  You are having menopausal symptoms and need advice and treatment.  You are still having menstrual periods after age 55 years.  You have pain with intercourse.  Menopause is complete (no menstrual period for 12 months) and you develop vaginal bleeding.  You need a referral to a specialist (gynecologist, psychiatrist, or psychologist) for treatment. Get help right   away if:  You have severe depression.  You have excessive vaginal bleeding.  You fell and think you have a  broken bone.  You have pain when you urinate.  You develop leg or chest pain.  You have a fast pounding heart beat (palpitations).  You have severe headaches.  You develop vision problems.  You feel a lump in your breast.  You have abdominal pain or severe indigestion. This information is not intended to replace advice given to you by your health care provider. Make sure you discuss any questions you have with your health care provider. Document Released: 02/26/2004 Document Revised: 05/13/2016 Document Reviewed: 07/05/2013 Elsevier Interactive Patient Education  2017 Elsevier Inc.  

## 2017-01-28 NOTE — Progress Notes (Signed)
[facility] Visit  @DATE @            Patient name: Sandra Adams MRN [id number]  Date of birth: 1966/12/19  CC & HPI:  Sandra Adams is a 51 y.o. female presenting today to established care at this office. She reports multiple complaints; she reports persistent urinary frequency and concern for a UTI as well as concern for fibroids. She is 1.5-2 years post-menopausal and reports having menopause symptoms such has hot flashes. She denies history of pregnancy. She denies history of blood clots, strokes, migraines.   She is also here to have a pap smear  ROS:  ROS +urinary frequency - vaginal bleeding -vaginal discharge  Pertinent History Reviewed:   Reviewed: Significant for post menopausal  Medical         Past Medical History:  Diagnosis Date  . Anxiety   . Depression   . Menopausal symptom   . Migraine   . Scoliosis                               Surgical Hx:    Past Surgical History:  Procedure Laterality Date  . KNEE SURGERY Left 1974 and 1979   Medications: Reviewed & Updated - see associated section                       Current Outpatient Prescriptions:  .  ALPRAZolam (XANAX) 0.25 MG tablet, TAKE 1 TABLET BY MOUTH AT BEDTIME AS NEEDED FOR ANXIETY, Disp: 30 tablet, Rfl: 3 .  cyclobenzaprine (FLEXERIL) 5 MG tablet, Take 1 tablet (5 mg total) by mouth 3 (three) times daily as needed for muscle spasms., Disp: 30 tablet, Rfl: 0 .  fluticasone (FLONASE) 50 MCG/ACT nasal spray, Place 2 sprays into both nostrils daily., Disp: 16 g, Rfl: 6 .  ibuprofen (ADVIL,MOTRIN) 600 MG tablet, Take 1 tablet (600 mg total) by mouth every 8 (eight) hours as needed for mild pain or moderate pain., Disp: 30 tablet, Rfl: 2 .  traMADol-acetaminophen (ULTRACET) 37.5-325 MG tablet, TAKE 2 TABLETS BY MOUTH EVERY 6 TO 8 HOURS AS NEEDED MAX 8/24 HRS, Disp: 30 tablet, Rfl: 2 .  omeprazole (PRILOSEC) 20 MG capsule, Take 1 capsule (20 mg total) by mouth daily. (Patient not taking: Reported  on 01/28/2017), Disp: 90 capsule, Rfl: 3 .  Probiotic Product (PRO-BIOTIC BLEND) CAPS, Take 1 capsule by mouth daily. (Patient not taking: Reported on 01/28/2017), Disp: 30 capsule, Rfl: 1   Social History: Reviewed -  reports that she has never smoked. She has never used smokeless tobacco.  Objective Findings:  Vitals: Blood pressure 130/80, pulse 80, weight 144 lb 3.2 oz (65.4 kg), last menstrual period 10/16/2014.  Physical Examination: General appearance - alert, well appearing, and in no distress and oriented to person, place, and time Pelvic - VULVA: normal appearing vulva with no masses, tenderness or lesions,  VAGINA: normal appearing vagina with normal color and discharge, no lesions,  CERVIX: Light spotting from cervix, well supported, nulliparous  UTERUS: uterus is small, shape, consistency and nontender,  ADNEXA: normal adnexa in size, nontender and no masses   Assessment & Plan:   A:  1. Atrophic vaginitis 2. Menopausal symptoms 3. Urinary Frequency 4. ?mental function limited   P:  1. Urine culture 2. Try hormone replacement, HT estradiol 1 mg QD 3. Pap smear completed 4 f/u 6 wk will add progestin if pt  to stay on HT long term.    By signing my name below, I, Sonum Patel, attest that this documentation has been prepared under the direction and in the presence of Jonnie Kind, MD. Electronically Signed: Sonum Patel, Education administrator. 01/28/17. 9:25 AM.  I personally performed the services described in this documentation, which was SCRIBED in my presence. The recorded information has been reviewed and considered accurate. It has been edited as necessary during review. Jonnie Kind, MD

## 2017-01-30 LAB — URINE CULTURE: ORGANISM ID, BACTERIA: NO GROWTH

## 2017-01-31 ENCOUNTER — Telehealth: Payer: Self-pay | Admitting: Family

## 2017-01-31 ENCOUNTER — Other Ambulatory Visit: Payer: Self-pay | Admitting: *Deleted

## 2017-01-31 ENCOUNTER — Other Ambulatory Visit: Payer: Medicare Other

## 2017-01-31 DIAGNOSIS — R350 Frequency of micturition: Secondary | ICD-10-CM | POA: Diagnosis not present

## 2017-01-31 DIAGNOSIS — Z87448 Personal history of other diseases of urinary system: Secondary | ICD-10-CM | POA: Diagnosis not present

## 2017-01-31 NOTE — Telephone Encounter (Signed)
I can see the urine and a culture and I do see that she had a trace amount of blood in her urine. They would not tell her to come get a prescription for antibiotic from [place]. Her culture came back and did not grow anything. If she wants to follow the trace blood and she can come in and leave another urine sample and we can put in that order and follow up on that but I do not think she is warranted for another antibiotic at this time.

## 2017-02-01 LAB — URINALYSIS, COMPLETE
Bilirubin, UA: NEGATIVE
Glucose, UA: NEGATIVE
Ketones, UA: NEGATIVE
Nitrite, UA: NEGATIVE
Protein, UA: NEGATIVE
RBC, UA: NEGATIVE
Specific Gravity, UA: 1.022 (ref 1.005–1.030)
Urobilinogen, Ur: 0.2 mg/dL (ref 0.2–1.0)
pH, UA: 7 (ref 5.0–7.5)

## 2017-02-01 LAB — MICROSCOPIC EXAMINATION: Casts: NONE SEEN /lpf

## 2017-02-01 LAB — URINE CULTURE

## 2017-02-02 LAB — CYTOLOGY - PAP
CHLAMYDIA, DNA PROBE: NEGATIVE
Diagnosis: NEGATIVE
HPV (WINDOPATH): DETECTED — AB
NEISSERIA GONORRHEA: NEGATIVE

## 2017-02-08 ENCOUNTER — Other Ambulatory Visit (INDEPENDENT_AMBULATORY_CARE_PROVIDER_SITE_OTHER): Payer: Medicare Other

## 2017-02-08 ENCOUNTER — Telehealth: Payer: Self-pay

## 2017-02-08 DIAGNOSIS — R3129 Other microscopic hematuria: Secondary | ICD-10-CM

## 2017-02-08 DIAGNOSIS — N390 Urinary tract infection, site not specified: Secondary | ICD-10-CM

## 2017-02-08 DIAGNOSIS — R300 Dysuria: Secondary | ICD-10-CM

## 2017-02-08 LAB — URINALYSIS, COMPLETE
Bilirubin, UA: NEGATIVE
Glucose, UA: NEGATIVE
Ketones, UA: NEGATIVE
NITRITE UA: NEGATIVE
PH UA: 5 (ref 5.0–7.5)
Specific Gravity, UA: 1.025 (ref 1.005–1.030)
Urobilinogen, Ur: 0.2 mg/dL (ref 0.2–1.0)

## 2017-02-08 LAB — MICROSCOPIC EXAMINATION
Epithelial Cells (non renal): >10 /hpf — AB (ref 0–10)
RENAL EPITHEL UA: NONE SEEN /HPF

## 2017-02-08 NOTE — Telephone Encounter (Signed)
lmtcb jkp 2/20

## 2017-02-08 NOTE — Telephone Encounter (Signed)
Patient would like a referral to urology due to chronic uti's and blood in urine. She would also like a referral to a nephrologist due to back pain. She just wants an expert to make sure her kidneys are ok. Please advise and route to [facility]

## 2017-02-08 NOTE — Telephone Encounter (Signed)
Referral to Urologists placed.  

## 2017-02-08 NOTE — Telephone Encounter (Signed)
Pt notified to come get another urine specimen  She said she is going to make an appt.

## 2017-02-08 NOTE — Telephone Encounter (Signed)
This should be in Pool A

## 2017-02-09 ENCOUNTER — Ambulatory Visit (INDEPENDENT_AMBULATORY_CARE_PROVIDER_SITE_OTHER): Payer: Medicare Other | Admitting: Family Medicine

## 2017-02-09 ENCOUNTER — Encounter: Payer: Self-pay | Admitting: Family Medicine

## 2017-02-09 VITALS — BP 125/76 | HR 79 | Temp 97.4°F | Ht 61.5 in | Wt 142.2 lb

## 2017-02-09 DIAGNOSIS — N3090 Cystitis, unspecified without hematuria: Secondary | ICD-10-CM

## 2017-02-09 NOTE — Progress Notes (Signed)
BP 125/76   Pulse 79   Temp 97.4 F (36.3 C) (Oral)   Ht 5' 1.5" (1.562 m)   Wt 142 lb 3.2 oz (64.5 kg)   LMP 10/16/2014   BMI 26.43 kg/m    Subjective:    Patient ID: Sandra Adams, female    DOB: [birth year], 51 y.o.   MRN: [id number]  HPI: Sandra Adams is a 51 y.o. female presenting on 02/09/2017 for Dysuria (x 2 days) and Flank Pain (left)   HPI Dysuria and vaginal irritation, chronic Patient has been having dysuria and vaginal irritation that is chronic. She gets this recurrently and has had multiple antibiotics and multiple rounds of medicines to treat both vaginal irritation and symptoms and bladder irritation and symptoms. She will get better for a short term while she is on the medications and then it comes right back. She is having some vaginal irritation as well but denies any discharge currently. She did go see the gynecologist and they started her on hormone replacement therapy. She denies any fevers or chills or flank pain. She denies any hematuria but is having some darkened urine and just irritation.  Relevant past medical, surgical, family and social history reviewed and updated as indicated. Interim medical history since our last visit reviewed. Allergies and medications reviewed and updated.  Review of Systems  Constitutional: Negative for chills and fever.  HENT: Negative for congestion, ear discharge and ear pain.   Eyes: Negative for redness and visual disturbance.  Respiratory: Negative for chest tightness and shortness of breath.   Cardiovascular: Negative for chest pain and leg swelling.  Gastrointestinal: Positive for abdominal pain.  Genitourinary: Positive for dysuria, vaginal discharge and vaginal pain. Negative for decreased urine volume, difficulty urinating, hematuria, pelvic pain and urgency.  Musculoskeletal: Negative for back pain and gait problem.  Skin: Negative for rash.  Neurological: Negative for light-headedness and headaches.    Psychiatric/Behavioral: Negative for agitation and behavioral problems.  All other systems reviewed and are negative.   Per HPI unless specifically indicated above     Objective:    BP 125/76   Pulse 79   Temp 97.4 F (36.3 C) (Oral)   Ht 5' 1.5" (1.562 m)   Wt 142 lb 3.2 oz (64.5 kg)   LMP 10/16/2014   BMI 26.43 kg/m   Wt Readings from Last 3 Encounters:  02/09/17 142 lb 3.2 oz (64.5 kg)  01/28/17 144 lb 3.2 oz (65.4 kg)  01/13/17 147 lb (66.7 kg)    Physical Exam  Constitutional: She is oriented to person, place, and time. She appears well-developed and well-nourished. No distress.  Eyes: Conjunctivae are normal.  Cardiovascular: Normal rate, regular rhythm, normal heart sounds and intact distal pulses.   No murmur heard. Pulmonary/Chest: Effort normal and breath sounds normal. No respiratory distress. She has no wheezes. She has no rales.  Abdominal: Soft. Bowel sounds are normal. She exhibits no distension. There is tenderness (Left lower quadrant and suprapubic abdominal tenderness). There is no rebound and no guarding.  Musculoskeletal: Normal range of motion. She exhibits no edema or tenderness.  Neurological: She is alert and oriented to person, place, and time. Coordination normal.  Skin: Skin is warm and dry. No rash noted. She is not diaphoretic.  Psychiatric: She has a normal mood and affect. Her behavior is normal.  Nursing note and vitals reviewed.  Urinalysis: Greater than 30 WBCs, 11-30 RBCs, greater than 10 epithelial cells, few bacteria, nitrite negative  Assessment & Plan:   Problem List Items Addressed This Visit    None    Visit Diagnoses    Cystitis    -  Primary   Chronic cystitis and bladder inflammatory disorder, has had multiple culture negative urinary tract infections, refer to urology   Relevant Orders   Ambulatory referral to Urology      Follow up plan: Return if symptoms worsen or fail to improve.  Counseling provided for all of  the vaccine components Orders Placed This Encounter  Procedures  . Ambulatory referral to Urology    Caryl Pina, MD [facility] 02/09/2017, 2:01 PM

## 2017-02-09 NOTE — Telephone Encounter (Signed)
Patient seen today by dr. Warrick Parisian and referred to urologist

## 2017-02-10 LAB — URINE CULTURE

## 2017-02-10 MED ORDER — CEPHALEXIN 500 MG PO CAPS: 500.0000 mg | ORAL_CAPSULE | Freq: Four times a day (QID) | ORAL | 0 refills | Status: DC

## 2017-03-01 ENCOUNTER — Ambulatory Visit: Payer: Medicare Other | Admitting: Family

## 2017-03-14 ENCOUNTER — Ambulatory Visit: Payer: Medicare Other | Admitting: Obstetrics and Gynecology

## 2017-04-05 ENCOUNTER — Ambulatory Visit (INDEPENDENT_AMBULATORY_CARE_PROVIDER_SITE_OTHER): Payer: Medicare Other | Admitting: Family

## 2017-04-05 ENCOUNTER — Encounter: Payer: Self-pay | Admitting: Family

## 2017-04-05 VITALS — BP 138/80 | HR 99 | Temp 99.0°F | Ht 61.5 in | Wt 140.4 lb

## 2017-04-05 DIAGNOSIS — N949 Unspecified condition associated with female genital organs and menstrual cycle: Secondary | ICD-10-CM | POA: Diagnosis not present

## 2017-04-05 DIAGNOSIS — N3001 Acute cystitis with hematuria: Secondary | ICD-10-CM | POA: Diagnosis not present

## 2017-04-05 LAB — WET PREP FOR TRICH, YEAST, CLUE
Clue Cell Exam: NEGATIVE
Trichomonas Exam: NEGATIVE
Yeast Exam: NEGATIVE

## 2017-04-05 LAB — MICROSCOPIC EXAMINATION: RENAL EPITHEL UA: NONE SEEN /HPF

## 2017-04-05 LAB — URINALYSIS, COMPLETE
BILIRUBIN UA: NEGATIVE
GLUCOSE, UA: NEGATIVE
KETONES UA: NEGATIVE
NITRITE UA: NEGATIVE
Urobilinogen, Ur: 0.2 mg/dL (ref 0.2–1.0)
pH, UA: 5 (ref 5.0–7.5)

## 2017-04-05 MED ORDER — SULFAMETHOXAZOLE-TRIMETHOPRIM 800-160 MG PO TABS: 1.0000 | ORAL_TABLET | Freq: Two times a day (BID) | ORAL | 0 refills | Status: DC

## 2017-04-05 NOTE — Patient Instructions (Signed)

## 2017-04-05 NOTE — Progress Notes (Signed)
   Subjective:    Patient ID: Sandra Adams, female    DOB: 02/28/1966, 52 y.o.   MRN: [id number]  Urinary Frequency   This is a recurrent problem. The current episode started 1 to 4 weeks ago. The problem occurs intermittently. The patient is experiencing no pain. Associated symptoms include frequency, nausea and urgency. Pertinent negatives include no discharge or vomiting. She has tried increased fluids and antibiotics (found old macrobid rx and took 3 days ) for the symptoms. The treatment provided mild relief.     Review of Systems  Gastrointestinal: Positive for nausea. Negative for vomiting.  Genitourinary: Positive for frequency and urgency.  All other systems reviewed and are negative.      Objective:   Physical Exam  Constitutional: She is oriented to person, place, and time. She appears well-developed and well-nourished. No distress.  HENT:  Head: Normocephalic.  Eyes: Pupils are equal, round, and reactive to light.  Neck: Normal range of motion. Neck supple. No thyromegaly present.  Cardiovascular: Normal rate, regular rhythm, normal heart sounds and intact distal pulses.   No murmur heard. Pulmonary/Chest: Effort normal and breath sounds normal. No respiratory distress. She has no wheezes.  Abdominal: Soft. Bowel sounds are normal. She exhibits no distension. There is tenderness (mild lower abd tenderness).  Musculoskeletal: Normal range of motion. She exhibits no edema or tenderness.  Neurological: She is alert and oriented to person, place, and time.  Skin: Skin is warm and dry.  Psychiatric: She has a normal mood and affect. Her behavior is normal. Judgment and thought content normal.  Vitals reviewed.  BP 138/80   Pulse 99   Temp 99 F (37.2 C) (Oral)   Ht 5' 1.5" (1.562 m)   Wt 140 lb 6.4 oz (63.7 kg)   LMP 10/16/2014   BMI 26.10 kg/m      Assessment & Plan:  1. Vaginal burning - Urinalysis, Complete - WET PREP FOR TRICH, YEAST, CLUE  2. Acute  cystitis with hematuria Force fluids AZO over the counter X2 days RTO prn Culture pending - sulfamethoxazole-trimethoprim (BACTRIM DS) 800-160 MG tablet; Take 1 tablet by mouth 2 (two) times daily.  Dispense: 14 tablet; Refill: 0 - Urine culture   [name], FNP

## 2017-04-07 ENCOUNTER — Telehealth: Payer: Self-pay | Admitting: Obstetrics and Gynecology

## 2017-04-07 LAB — URINE CULTURE: Organism ID, Bacteria: NO GROWTH

## 2017-04-07 NOTE — Telephone Encounter (Signed)
Informed patient that per Dr Glo Herring note, she is to take Estradiol daily until follow-up in 6 wks. Patient states she stopped taking meds for 1 month but I advised to start taking again. Patient verbalized understanding.

## 2017-04-12 ENCOUNTER — Other Ambulatory Visit: Payer: Self-pay | Admitting: Family

## 2017-04-15 ENCOUNTER — Ambulatory Visit (INDEPENDENT_AMBULATORY_CARE_PROVIDER_SITE_OTHER): Payer: Medicare Other | Admitting: Family Medicine

## 2017-04-15 ENCOUNTER — Encounter: Payer: Self-pay | Admitting: Family Medicine

## 2017-04-15 VITALS — BP 128/76 | HR 79 | Temp 98.2°F | Ht 61.5 in | Wt 139.0 lb

## 2017-04-15 DIAGNOSIS — L300 Nummular dermatitis: Secondary | ICD-10-CM

## 2017-04-15 MED ORDER — FLUOCINONIDE-E 0.05 % EX CREA: 1.0000 "application " | TOPICAL_CREAM | Freq: Two times a day (BID) | CUTANEOUS | 5 refills | Status: DC

## 2017-04-15 NOTE — Progress Notes (Signed)
Subjective:  Patient ID: Sandra Adams, female    DOB: Apr 09, 1966  Age: 51 y.o. MRN: [id number]  CC: Skin Problem (pt here today c/o a spot on her arm and one on the back of her neck)   HPI Sandra Adams presents for History of crusted lesion on her right upper arm that itches. Also on the back of her neck and on the base of her anterior neck. There are 2 smaller ones on the right forearm  History Sandra Adams has a past medical history of Anxiety; Depression; Menopausal symptom; Migraine; and Scoliosis.   She has a past surgical history that includes Knee surgery (Left, 1974 and 1979).   Her family history includes Anxiety disorder in her sister; COPD in her father, maternal aunt, and sister; Diabetes in her maternal aunt and maternal uncle; Epilepsy in her mother; Heart disease in her father; Hyperlipidemia in her father.She reports that she has never smoked. She has never used smokeless tobacco. She reports that she does not drink alcohol or use drugs.  Current Outpatient Prescriptions on File Prior to Visit  Medication Sig Dispense Refill  . ALPRAZolam (XANAX) 0.25 MG tablet TAKE 1 TABLET BY MOUTH AT BEDTIME AS NEEDED FOR ANXIETY 30 tablet 3  . cyclobenzaprine (FLEXERIL) 5 MG tablet Take 1 tablet (5 mg total) by mouth 3 (three) times daily as needed for muscle spasms. 30 tablet 0  . estradiol (ESTRACE) 1 MG tablet Take 1 tablet (1 mg total) by mouth daily. 30 tablet 11  . fluticasone (FLONASE) 50 MCG/ACT nasal spray Place 2 sprays into both nostrils daily. 16 g 6  . ibuprofen (ADVIL,MOTRIN) 600 MG tablet Take 1 tablet (600 mg total) by mouth every 8 (eight) hours as needed for mild pain or moderate pain. 30 tablet 2  . omeprazole (PRILOSEC) 20 MG capsule Take 1 capsule (20 mg total) by mouth daily. 90 capsule 3  . Probiotic Product (PRO-BIOTIC BLEND) CAPS Take 1 capsule by mouth daily. 30 capsule 1  . traMADol-acetaminophen (ULTRACET) 37.5-325 MG tablet TAKE 2 TABLETS BY MOUTH EVERY 6  TO 8 HOURS AS NEEDED MAX 8/24 HRS 30 tablet 2   No current facility-administered medications on file prior to visit.     ROS Review of Systems  Constitutional: Negative for fever.  HENT: Negative for congestion.   Respiratory: Negative.   Cardiovascular: Negative.   Genitourinary: Negative for dysuria.  Skin:       Raised 5 mm crusted nodule at the right upper arm over the upper triceps region. 4 mm crusted nodule at the nape of the neck just to the left of the midline 2 on the forearm are 3 mm but not raised. One of the base of the neck is 2 mm slightly crusted. Raised. Nonerythematous most are pale to skin tone.    Objective:  BP 128/76   Pulse 79   Temp 98.2 F (36.8 C) (Oral)   Ht 5' 1.5" (1.562 m)   Wt 139 lb (63 kg)   LMP 10/16/2014   BMI 25.84 kg/m   Physical Exam  Constitutional: She is oriented to person, place, and time. She appears well-developed and well-nourished. No distress.  Neurological: She is alert and oriented to person, place, and time.  Skin: Skin is warm and dry.  Psychiatric: She has a normal mood and affect.    Assessment & Plan:   Sandra Adams was seen today for skin problem.  Diagnoses and all orders for this visit:  Nummular eczema  Other orders -     fluocinonide-emollient (LIDEX-E) 0.05 % cream; Apply 1 application topically 2 (two) times daily. To affected areas   I have discontinued Ms. Holstein's sulfamethoxazole-trimethoprim. I am also having her start on fluocinonide-emollient. Additionally, I am having her maintain her omeprazole, fluticasone, cyclobenzaprine, PRO-BIOTIC BLEND, traMADol-acetaminophen, ibuprofen, ALPRAZolam, and estradiol.  Meds ordered this encounter  Medications  . fluocinonide-emollient (LIDEX-E) 0.05 % cream    Sig: Apply 1 application topically 2 (two) times daily. To affected areas    Dispense:  60 g    Refill:  5     Follow-up: Return if symptoms worsen or fail to improve.  Claretta Fraise, M.D.

## 2017-04-20 ENCOUNTER — Ambulatory Visit: Payer: Self-pay | Admitting: Urology

## 2017-04-22 ENCOUNTER — Ambulatory Visit (INDEPENDENT_AMBULATORY_CARE_PROVIDER_SITE_OTHER): Payer: Medicare Other | Admitting: *Deleted

## 2017-04-22 VITALS — BP 120/80 | HR 77 | Temp 97.1°F | Ht 62.0 in | Wt 139.0 lb

## 2017-04-22 DIAGNOSIS — Z0000 Encounter for general adult medical examination without abnormal findings: Secondary | ICD-10-CM

## 2017-04-22 NOTE — Progress Notes (Signed)
Subjective:   Sandra Adams is a 51 y.o. female who presents for an Initial Medicare Annual Wellness Visit.  Pt is on disability. She does not drive. She will walk and ride her bike places for exercise. She does not eat breakfast. She eats lunch and supper at take out places. We discussed the importance of breakfast and cutting back on fast food. She lives alone and never had children. She has a boyfriend of 5 years. She has a small dog at home and is aware of tripping hazards. No stairs. She states she feels worse than this time last year due to menopause.  thi      Objective:    Today's Vitals   04/22/17 1051  BP: 120/80  Pulse: 77  Temp: 97.1 F (36.2 C)  TempSrc: Oral  Weight: 139 lb (63 kg)  Height: 5\' 2"  (1.575 m)   Body mass index is 25.42 kg/m.   Current Medications (verified) Outpatient Encounter Prescriptions as of 04/22/2017  Medication Sig  . ALPRAZolam (XANAX) 0.25 MG tablet TAKE 1 TABLET BY MOUTH AT BEDTIME AS NEEDED FOR ANXIETY  . cyclobenzaprine (FLEXERIL) 5 MG tablet Take 1 tablet (5 mg total) by mouth 3 (three) times daily as needed for muscle spasms.  Marland Kitchen estradiol (ESTRACE) 1 MG tablet Take 1 tablet (1 mg total) by mouth daily.  . fluocinonide-emollient (LIDEX-E) 0.05 % cream Apply 1 application topically 2 (two) times daily. To affected areas  . fluticasone (FLONASE) 50 MCG/ACT nasal spray Place 2 sprays into both nostrils daily.  Marland Kitchen ibuprofen (ADVIL,MOTRIN) 600 MG tablet Take 1 tablet (600 mg total) by mouth every 8 (eight) hours as needed for mild pain or moderate pain.  . traMADol-acetaminophen (ULTRACET) 37.5-325 MG tablet TAKE 2 TABLETS BY MOUTH EVERY 6 TO 8 HOURS AS NEEDED MAX 8/24 HRS  . Probiotic Product (PRO-BIOTIC BLEND) CAPS Take 1 capsule by mouth daily. (Patient not taking: Reported on 04/22/2017)  . [DISCONTINUED] omeprazole (PRILOSEC) 20 MG capsule Take 1 capsule (20 mg total) by mouth daily.   No facility-administered encounter medications on  file as of 04/22/2017.     Allergies (verified) Ciprofloxacin; Imitrex [sumatriptan]; Prednisone; Tylenol [acetaminophen]; and Vicodin [hydrocodone-acetaminophen]   History: Past Medical History:  Diagnosis Date  . Anxiety   . Depression   . Menopausal symptom   . Migraine   . Scoliosis    Past Surgical History:  Procedure Laterality Date  . KNEE SURGERY Left 1974 and 1979   Family History  Problem Relation Age of Onset  . Epilepsy Mother   . COPD Father   . Hyperlipidemia Father   . Heart disease Father   . Cancer Father   . COPD Sister   . Anxiety disorder Sister   . COPD Maternal Aunt   . Diabetes Maternal Uncle   . Diabetes Maternal Aunt     x2  . Colon cancer Neg Hx   . Colon polyps Neg Hx   . Esophageal cancer Neg Hx   . Gallbladder disease Neg Hx    Social History   Occupational History  . Diability     Back : Scoliois Knee surgery    Social History Main Topics  . Smoking status: Never Smoker  . Smokeless tobacco: Never Used  . Alcohol use No  . Drug use: No  . Sexual activity: Yes    Partners: Male    Birth control/ protection: None    Tobacco Counseling Counseling given: Not Answered  Never smoked Activities  of Daily Living In your present state of health, do you have any difficulty performing the following activities: 04/22/2017 08/04/2016  Hearing? N N  Vision? N N  Difficulty concentrating or making decisions? N N  Walking or climbing stairs? N N  Dressing or bathing? N N  Doing errands, shopping? N N  Some recent data might be hidden   No problems Immunizations and Health Maintenance Immunization History  Administered Date(s) Administered  . Tdap 12/21/2007   There are no preventive care reminders to display for this patient.  Patient Care Team: Sharion Balloon, FNP as PCP - General (Nurse Practitioner) Jonnie Kind, MD as Consulting Physician (Obstetrics and Gynecology)  Indicate any recent Medical Services you may have  received from other than [facility] providers in the past year (date may be approximate).     Assessment:   This is a routine wellness examination for Sandra Adams.  Hearing/Vision screen No exam data present No issues Dietary issues and exercise activities discussed:    Goals    . Reduce fast food intake      Depression Screen PHQ 2/9 Scores 04/22/2017 04/05/2017 02/09/2017 01/13/2017 12/31/2016 12/16/2016 10/28/2016  PHQ - 2 Score 2 2 2 2 2 2  0  PHQ- 9 Score 5 4 4 4 7 6  -    Fall Risk Fall Risk  04/22/2017 04/05/2017 02/09/2017 01/13/2017 12/31/2016  Falls in the past year? No No No Yes Yes  Number falls in past yr: - - - 1 1  Injury with Fall? - - - Yes Yes  Risk Factor Category  - - - - -  Risk for fall due to : - - - - -  Follow up - - - - -    Cognitive Function: MMSE - [place] Exam 04/22/2017 05/09/2015  Not completed: - Refused  Orientation to time 5 5  Orientation to Place 5 5  Registration 3 3  Attention/ Calculation 3 5  Recall 3 2  Language- name 2 objects 2 2  Language- repeat 1 1  Language- follow 3 step command 1 3  Language- read & follow direction 1 1  Write a sentence 1 1  Copy design 1 1  Total score 26 29    26/30    Screening Tests Health Maintenance  Topic Date Due  . COLON CANCER SCREENING ANNUAL FOBT  06/30/2017 (Originally 01/18/2016)  . INFLUENZA VACCINE  08/31/2017 (Originally 07/20/2017)  . TETANUS/TDAP  12/20/2017  . Fecal DNA (Cologuard)  01/17/2018  . MAMMOGRAM  05/27/2018  . PAP SMEAR  01/29/2020  . Hepatitis C Screening  Completed  . HIV Screening  Completed      Plan:   Pt will receive a CXR, EKG and Dexa on May 09, 2017 visit. She declined pneumonia vaccine.  I have personally reviewed and noted the following in the patient's chart:   . Medical and social history . Use of alcohol, tobacco or illicit drugs  . Current medications and supplements . Functional ability and status . Nutritional status . Physical activity . Advanced  directives . List of other physicians . Hospitalizations, surgeries, and ER visits in previous 12 months . Vitals . Screenings to include cognitive, depression, and falls . Referrals and appointments  In addition, I have reviewed and discussed with patient certain preventive protocols, quality metrics, and best practice recommendations. A written personalized care plan for preventive services as well as general preventive health recommendations were provided to patient.     Orley Lawry,  Ventura Bruns, LPN   02/20/8328   I have reviewed and agree with the above AWV documentation.   [name], FNP

## 2017-04-22 NOTE — Patient Instructions (Signed)
  Ms. Delancy , Thank you for taking time to come for your Medicare Wellness Visit. I appreciate your ongoing commitment to your health goals. Please review the following plan we discussed and let me know if I can assist you in the future.   These are the goals we discussed: Goals    . Reduce fast food intake       This is a list of the screening recommended for you and due dates:  Health Maintenance  Topic Date Due  . Stool Blood Test  06/30/2017*  . Flu Shot  08/31/2017*  . Tetanus Vaccine  12/20/2017  . Cologuard (Stool DNA test)  01/17/2018  . Mammogram  05/27/2018  . Pap Smear  01/29/2020  .  Hepatitis C: One time screening is recommended by Center for Disease Control  (CDC) for  adults born from [birth year] through 1967.   Completed  . HIV Screening  Completed  *Topic was postponed. The date shown is not the original due date.  Dexa, EKG, CXR next visit 05/09/17.

## 2017-04-28 ENCOUNTER — Ambulatory Visit (INDEPENDENT_AMBULATORY_CARE_PROVIDER_SITE_OTHER): Payer: Medicare Other | Admitting: Pediatrics

## 2017-04-28 VITALS — BP 130/80 | HR 80 | Temp 97.7°F | Ht 62.0 in | Wt 139.0 lb

## 2017-04-28 DIAGNOSIS — R300 Dysuria: Secondary | ICD-10-CM | POA: Diagnosis not present

## 2017-04-28 DIAGNOSIS — B373 Candidiasis of vulva and vagina: Secondary | ICD-10-CM | POA: Diagnosis not present

## 2017-04-28 DIAGNOSIS — B3731 Acute candidiasis of vulva and vagina: Secondary | ICD-10-CM

## 2017-04-28 LAB — URINALYSIS
BILIRUBIN UA: NEGATIVE
Glucose, UA: NEGATIVE
KETONES UA: NEGATIVE
NITRITE UA: NEGATIVE
Protein, UA: NEGATIVE
SPEC GRAV UA: 1.02 (ref 1.005–1.030)
UUROB: 0.2 mg/dL (ref 0.2–1.0)
pH, UA: 7 (ref 5.0–7.5)

## 2017-04-28 MED ORDER — FLUCONAZOLE 150 MG PO TABS: 150.0000 mg | ORAL_TABLET | Freq: Once | ORAL | 0 refills | Status: AC

## 2017-04-28 MED ORDER — CEFDINIR 300 MG PO CAPS: 300.0000 mg | ORAL_CAPSULE | Freq: Two times a day (BID) | ORAL | 0 refills | Status: DC

## 2017-04-28 MED ORDER — FLUCONAZOLE 150 MG PO TABS: 150.0000 mg | ORAL_TABLET | Freq: Once | ORAL | 0 refills | Status: DC

## 2017-04-28 NOTE — Progress Notes (Signed)
  Subjective:   Patient ID: Sandra Adams, female    DOB: 07/21/1966, 51 y.o.   MRN: [id number] CC: Abdominal Pain and Dysuria  HPI: Sandra Adams is a 51 y.o. female presenting for Abdominal Pain and Dysuria  Doesn't burn with urination today Was treated for UTI 3 weeks ago Some itching in vaginal area, is bothering her most No fevers No abd pain other than bloating  Tries to eat just when she is hungry, appetite has been fine Sometimes will have bloating in her abd Worse if she eats before bed then lies down Says she has been told she has a hiatal hernia  Has upcoming appt with urology in July  Was started on hormone replacement by gynecology Says the hot flashes has been better  Relevant past medical, surgical, family and social history reviewed. Allergies and medications reviewed and updated. History  Smoking Status  . Never Smoker  Smokeless Tobacco  . Never Used   ROS: Per HPI   Objective:    BP 130/80   Pulse 80   Temp 97.7 F (36.5 C) (Oral)   Ht 5\' 2"  (1.575 m)   Wt 139 lb (63 kg)   LMP 10/16/2014   BMI 25.42 kg/m   Wt Readings from Last 3 Encounters:  04/28/17 139 lb (63 kg)  04/22/17 139 lb (63 kg)  04/15/17 139 lb (63 kg)    Gen: NAD, alert, cooperative with exam, NCAT EYES: EOMI, no conjunctival injection, or no icterus CV: NRRR, normal S1/S2, no murmur, distal pulses 2+ b/l Resp: CTABL, no wheezes, normal WOB Abd: +BS, soft, NTND. no guarding or organomegaly Ext: No edema, warm Neuro: Alert and oriented  Assessment & Plan:  Sandra Adams was seen today for  dysuria.  Diagnoses and all orders for this visit:  Dysuria No symptoms today of dysuria, itching bothering her the most Treated for UTI last 3 weeks ago Small amount LE on UA Will treat for yeast infection, then treat based on culture results as needed -     Urinalysis -     Urine culture  Vaginal yeast infection Increased itching since being on antibiotics, will treat with one  dose of below -     fluconazole (DIFLUCAN) 150 MG tablet; Take 1 tablet (150 mg total) by mouth once.  Follow up plan: As needed Assunta Found, MD [place]

## 2017-04-29 ENCOUNTER — Other Ambulatory Visit: Payer: Self-pay | Admitting: *Deleted

## 2017-04-29 LAB — URINE CULTURE

## 2017-05-03 MED ORDER — ESTRADIOL 1 MG PO TABS: 1.0000 mg | ORAL_TABLET | Freq: Every day | ORAL | 4 refills | Status: DC

## 2017-05-04 ENCOUNTER — Encounter: Payer: Self-pay | Admitting: Obstetrics and Gynecology

## 2017-05-04 ENCOUNTER — Ambulatory Visit (INDEPENDENT_AMBULATORY_CARE_PROVIDER_SITE_OTHER): Payer: Medicare Other | Admitting: Obstetrics and Gynecology

## 2017-05-04 VITALS — BP 118/78 | HR 90 | Wt 140.0 lb

## 2017-05-04 DIAGNOSIS — N951 Menopausal and female climacteric states: Secondary | ICD-10-CM

## 2017-05-04 DIAGNOSIS — Z1389 Encounter for screening for other disorder: Secondary | ICD-10-CM

## 2017-05-04 LAB — POCT URINALYSIS DIPSTICK
Glucose, UA: NEGATIVE
KETONES UA: NEGATIVE
Leukocytes, UA: NEGATIVE
Nitrite, UA: NEGATIVE
PROTEIN UA: NEGATIVE

## 2017-05-04 MED ORDER — MEDROXYPROGESTERONE ACETATE 10 MG PO TABS: 10.0000 mg | ORAL_TABLET | Freq: Every day | ORAL | 0 refills | Status: DC

## 2017-05-04 NOTE — Progress Notes (Signed)
[facility] Visit  @DATE @            Patient name: Sandra Adams MRN [id number]  Date of birth: [birth year]  CC & HPI:  Sandra Adams is a 51 y.o. female presenting today for Follow-up regarding menopause symptoms. She was placed on estrogen only 6 weeks ago. She has uterus. Patient will require counseling in progestin therapy if she is going to stay on estrogen long-term  Patient's rambling conversation makes it difficult but we have finally talked through her questions on hormone therapy, pros and cons of estrogen benefits risk, and the need to periodically withdraw with Provera. Of also spent a lot of time talking about the pros and cons of progestin therapy. Intermatic progestin therapy for 10 days every 3 months is been agreed upon for now we will plan to have her review  further therapy annually ROS:  ROS The patient states that she has slightly reduced home hot sweat complaints. She says she sexually active but is anorgasmic. Partner Is here with her  Pertinent History Reviewed:   Reviewed: Significant for Regular mammograms normal Medical         Past Medical History:  Diagnosis Date  . Anxiety   . Depression   . Menopausal symptom   . Migraine   . Scoliosis                               Surgical Hx:    Past Surgical History:  Procedure Laterality Date  . KNEE SURGERY Left 1974 and 1979   Medications: Reviewed & Updated - see associated section                       Current Outpatient Prescriptions:  .  ALPRAZolam (XANAX) 0.25 MG tablet, TAKE 1 TABLET BY MOUTH AT BEDTIME AS NEEDED FOR ANXIETY, Disp: 30 tablet, Rfl: 3 .  estradiol (ESTRACE) 1 MG tablet, Take 1 tablet (1 mg total) by mouth daily., Disp: 90 tablet, Rfl: 4 .  fluocinonide-emollient (LIDEX-E) 0.05 % cream, Apply 1 application topically 2 (two) times daily. To affected areas, Disp: 60 g, Rfl: 5 .  fluticasone (FLONASE) 50 MCG/ACT nasal spray, Place 2 sprays into both nostrils daily., Disp: 16 g,  Rfl: 6 .  ibuprofen (ADVIL,MOTRIN) 600 MG tablet, Take 1 tablet (600 mg total) by mouth every 8 (eight) hours as needed for mild pain or moderate pain., Disp: 30 tablet, Rfl: 2 .  Probiotic Product (PRO-BIOTIC BLEND) CAPS, Take 1 capsule by mouth daily., Disp: 30 capsule, Rfl: 1 .  traMADol-acetaminophen (ULTRACET) 37.5-325 MG tablet, TAKE 2 TABLETS BY MOUTH EVERY 6 TO 8 HOURS AS NEEDED MAX 8/24 HRS, Disp: 30 tablet, Rfl: 2 .  vitamin E 100 UNIT capsule, Take 100 Units by mouth daily., Disp: , Rfl:    Social History: Reviewed -  reports that she has never smoked. She has never used smokeless tobacco.  Objective Findings:  Vitals: Blood pressure 118/78, pulse 90, weight 140 lb (63.5 kg), last menstrual period 10/16/2014.  Physical Examination: General appearance - alert, well appearing, and in no distress, oriented to person, place, and time, anxious and chronically ill appearing Mental status - alert, oriented to person, place, and time, affect appropriate to mood Eyes - pupils equal and reactive, extraocular eye movements intact   Assessment & Plan:   A:  1. Menopausal symptoms, attempting hormone therapy trial 2  Sexual dysfunction, not likely related to use of estrogen P:  1. Add Provera 10 mg daily 10 days per quarter to see if withdrawal bleed induced

## 2017-05-05 MED ORDER — MEDROXYPROGESTERONE ACETATE 10 MG PO TABS: 10.0000 mg | ORAL_TABLET | Freq: Every day | ORAL | 3 refills | Status: DC

## 2017-05-05 NOTE — Progress Notes (Signed)
Rx resent.

## 2017-05-05 NOTE — Progress Notes (Signed)
Will have LaTisha C-D call in rx.

## 2017-05-05 NOTE — Addendum Note (Signed)
Addended by: Jonnie Kind on: 05/05/2017 07:23 AM   Modules accepted: Orders

## 2017-05-09 ENCOUNTER — Ambulatory Visit (INDEPENDENT_AMBULATORY_CARE_PROVIDER_SITE_OTHER): Payer: Medicare Other | Admitting: Family

## 2017-05-09 ENCOUNTER — Ambulatory Visit (INDEPENDENT_AMBULATORY_CARE_PROVIDER_SITE_OTHER): Payer: Medicare Other

## 2017-05-09 ENCOUNTER — Encounter: Payer: Self-pay | Admitting: Family

## 2017-05-09 VITALS — BP 121/78 | HR 83 | Temp 97.6°F | Ht 62.0 in | Wt 138.0 lb

## 2017-05-09 DIAGNOSIS — K219 Gastro-esophageal reflux disease without esophagitis: Secondary | ICD-10-CM | POA: Diagnosis not present

## 2017-05-09 DIAGNOSIS — F411 Generalized anxiety disorder: Secondary | ICD-10-CM

## 2017-05-09 DIAGNOSIS — Z780 Asymptomatic menopausal state: Secondary | ICD-10-CM

## 2017-05-09 DIAGNOSIS — F331 Major depressive disorder, recurrent, moderate: Secondary | ICD-10-CM | POA: Diagnosis not present

## 2017-05-09 DIAGNOSIS — Z13220 Encounter for screening for lipoid disorders: Secondary | ICD-10-CM

## 2017-05-09 DIAGNOSIS — N951 Menopausal and female climacteric states: Secondary | ICD-10-CM

## 2017-05-09 DIAGNOSIS — M545 Low back pain, unspecified: Secondary | ICD-10-CM

## 2017-05-09 DIAGNOSIS — G8929 Other chronic pain: Secondary | ICD-10-CM

## 2017-05-09 MED ORDER — TRAMADOL-ACETAMINOPHEN 37.5-325 MG PO TABS: ORAL_TABLET | ORAL | 2 refills | Status: DC

## 2017-05-09 MED ORDER — ALPRAZOLAM 0.25 MG PO TABS: ORAL_TABLET | ORAL | 3 refills | Status: DC

## 2017-05-09 NOTE — Progress Notes (Signed)
Subjective:    Patient ID: Sandra Adams, female    DOB: Dec 19, 1966, 51 y.o.   MRN: [id number]  Pt presents to the office today for chronic follow up. Pt is followed by Gyn and was started on estradiol for menopausal symptoms.  Depression         This is a chronic problem.  The current episode started more than 1 year ago.   The onset quality is gradual.   Associated symptoms include decreased concentration, decreased interest and sad.  Associated symptoms include no helplessness, no hopelessness, not irritable and no restlessness.  Past medical history includes anxiety.   Anxiety  Presents for follow-up visit. Symptoms include decreased concentration, excessive worry and nervous/anxious behavior. Patient reports no irritability or restlessness. Symptoms occur occasionally.    Gastroesophageal Reflux  She complains of abdominal pain (at times), dysphagia and heartburn. She reports no coughing. This is a chronic problem. The symptoms are aggravated by certain foods. She has tried an antacid for the symptoms. The treatment provided mild relief.  Back Pain  This is a chronic problem. The current episode started more than 1 year ago. The problem occurs intermittently. The pain is present in the lumbar spine. The pain is at a severity of 5/10. The pain is moderate. Associated symptoms include abdominal pain (at times) and leg pain. Pertinent negatives include no tingling or weakness. She has tried bed rest and analgesics for the symptoms. The treatment provided moderate relief.      Review of Systems  Constitutional: Negative for irritability.  Respiratory: Negative for cough.   Gastrointestinal: Positive for abdominal pain (at times), dysphagia and heartburn.  Musculoskeletal: Positive for back pain.  Neurological: Negative for tingling and weakness.  Psychiatric/Behavioral: Positive for decreased concentration and depression. The patient is nervous/anxious.   All other systems reviewed and  are negative.      Objective:   Physical Exam  Constitutional: She is oriented to person, place, and time. She appears well-developed and well-nourished. She is not irritable. No distress.  HENT:  Head: Normocephalic and atraumatic.  Right Ear: External ear normal.  Left Ear: External ear normal.  Nose: Nose normal.  Mouth/Throat: Oropharynx is clear and moist.  Eyes: Pupils are equal, round, and reactive to light.  Neck: Normal range of motion. Neck supple. No thyromegaly present.  Cardiovascular: Normal rate, regular rhythm, normal heart sounds and intact distal pulses.   No murmur heard. Pulmonary/Chest: Effort normal and breath sounds normal. No respiratory distress. She has no wheezes.  Abdominal: Soft. Bowel sounds are normal. She exhibits no distension. There is no tenderness.  Musculoskeletal: Normal range of motion. She exhibits no edema or tenderness.  Neurological: She is alert and oriented to person, place, and time.  Skin: Skin is warm and dry.  Psychiatric: She has a normal mood and affect. Her behavior is normal. Judgment and thought content normal.  Vitals reviewed.     BP 121/78   Pulse 83   Temp 97.6 F (36.4 C)   Ht 5' 2"  (1.575 m)   Wt 138 lb (62.6 kg)   LMP 10/16/2014   BMI 25.24 kg/m      Assessment & Plan:  1. Gastroesophageal reflux disease without esophagitis - CMP14+EGFR  2. Menopausal hot flushes - CMP14+EGFR  3. GAD (generalized anxiety disorder) - CMP14+EGFR - ALPRAZolam (XANAX) 0.25 MG tablet; TAKE 1 TABLET BY MOUTH AT BEDTIME AS NEEDED FOR ANXIETY  Dispense: 30 tablet; Refill: 3  4. Moderate episode of recurrent  major depressive disorder (HCC) - CMP14+EGFR  5. Chronic bilateral low back pain without sciatica - CMP14+EGFR - traMADol-acetaminophen (ULTRACET) 37.5-325 MG tablet; TAKE 2 TABLETS BY MOUTH EVERY 6 TO 8 HOURS AS NEEDED MAX 8/24 HRS  Dispense: 30 tablet; Refill: 2  6. Screening for cholesterol level - CMP14+EGFR - Lipid  panel  7. Post-menopause - DG [facility] DEXA   Continue all meds Labs pending Health Maintenance reviewed Diet and exercise encouraged RTO 6 months   [name], FNP

## 2017-05-09 NOTE — Patient Instructions (Signed)
Health Maintenance, Female Adopting a healthy lifestyle and getting preventive care can go a long way to promote health and wellness. Talk with your health care provider about what schedule of regular examinations is right for you. This is a good chance for you to check in with your provider about disease prevention and staying healthy. In between checkups, there are plenty of things you can do on your own. Experts have done a lot of research about which lifestyle changes and preventive measures are most likely to keep you healthy. Ask your health care provider for more information. Weight and diet Eat a healthy diet  Be sure to include plenty of vegetables, fruits, low-fat dairy products, and lean protein.  Do not eat a lot of foods high in solid fats, added sugars, or salt.  Get regular exercise. This is one of the most important things you can do for your health.  Most adults should exercise for at least 150 minutes each week. The exercise should increase your heart rate and make you sweat (moderate-intensity exercise).  Most adults should also do strengthening exercises at least twice a week. This is in addition to the moderate-intensity exercise. Maintain a healthy weight  Body mass index (BMI) is a measurement that can be used to identify possible weight problems. It estimates body fat based on height and weight. Your health care provider can help determine your BMI and help you achieve or maintain a healthy weight.  For females 76 years of age and older:  A BMI below 18.5 is considered underweight.  A BMI of 18.5 to 24.9 is normal.  A BMI of 25 to 29.9 is considered overweight.  A BMI of 30 and above is considered obese. Watch levels of cholesterol and blood lipids  You should start having your blood tested for lipids and cholesterol at 53 years of age, then have this test every 5 years.  You may need to have your cholesterol levels checked more often if:  Your lipid or  cholesterol levels are high.  You are older than 53 years of age.  You are at high risk for heart disease. Cancer screening Lung Cancer  Lung cancer screening is recommended for adults 64-42 years old who are at high risk for lung cancer because of a history of smoking.  A yearly low-dose CT scan of the lungs is recommended for people who:  Currently smoke.  Have quit within the past 15 years.  Have at least a 30-pack-year history of smoking. A pack year is smoking an average of one pack of cigarettes a day for 1 year.  Yearly screening should continue until it has been 15 years since you quit.  Yearly screening should stop if you develop a health problem that would prevent you from having lung cancer treatment. Breast Cancer  Practice breast self-awareness. This means understanding how your breasts normally appear and feel.  It also means doing regular breast self-exams. Let your health care provider know about any changes, no matter how small.  If you are in your 20s or 30s, you should have a clinical breast exam (CBE) by a health care provider every 1-3 years as part of a regular health exam.  If you are 34 or older, have a CBE every year. Also consider having a breast X-ray (mammogram) every year.  If you have a family history of breast cancer, talk to your health care provider about genetic screening.  If you are at high risk for breast cancer, talk  to your health care provider about having an MRI and a mammogram every year.  Breast cancer gene (BRCA) assessment is recommended for women who have family members with BRCA-related cancers. BRCA-related cancers include:  Breast.  Ovarian.  Tubal.  Peritoneal cancers.  Results of the assessment will determine the need for genetic counseling and BRCA1 and BRCA2 testing. Cervical Cancer  Your health care provider may recommend that you be screened regularly for cancer of the pelvic organs (ovaries, uterus, and vagina).  This screening involves a pelvic examination, including checking for microscopic changes to the surface of your cervix (Pap test). You may be encouraged to have this screening done every 3 years, beginning at age 24.  For women ages 66-65, health care providers may recommend pelvic exams and Pap testing every 3 years, or they may recommend the Pap and pelvic exam, combined with testing for human papilloma virus (HPV), every 5 years. Some types of HPV increase your risk of cervical cancer. Testing for HPV may also be done on women of any age with unclear Pap test results.  Other health care providers may not recommend any screening for nonpregnant women who are considered low risk for pelvic cancer and who do not have symptoms. Ask your health care provider if a screening pelvic exam is right for you.  If you have had past treatment for cervical cancer or a condition that could lead to cancer, you need Pap tests and screening for cancer for at least 20 years after your treatment. If Pap tests have been discontinued, your risk factors (such as having a new sexual partner) need to be reassessed to determine if screening should resume. Some women have medical problems that increase the chance of getting cervical cancer. In these cases, your health care provider may recommend more frequent screening and Pap tests. Colorectal Cancer  This type of cancer can be detected and often prevented.  Routine colorectal cancer screening usually begins at 53 years of age and continues through 53 years of age.  Your health care provider may recommend screening at an earlier age if you have risk factors for colon cancer.  Your health care provider may also recommend using home test kits to check for hidden blood in the stool.  A small camera at the end of a tube can be used to examine your colon directly (sigmoidoscopy or colonoscopy). This is done to check for the earliest forms of colorectal cancer.  Routine  screening usually begins at age 41.  Direct examination of the colon should be repeated every 5-10 years through 53 years of age. However, you may need to be screened more often if early forms of precancerous polyps or small growths are found. Skin Cancer  Check your skin from head to toe regularly.  Tell your health care provider about any new moles or changes in moles, especially if there is a change in a mole's shape or color.  Also tell your health care provider if you have a mole that is larger than the size of a pencil eraser.  Always use sunscreen. Apply sunscreen liberally and repeatedly throughout the day.  Protect yourself by wearing long sleeves, pants, a wide-brimmed hat, and sunglasses whenever you are outside. Heart disease, diabetes, and high blood pressure  High blood pressure causes heart disease and increases the risk of stroke. High blood pressure is more likely to develop in:  People who have blood pressure in the high end of the normal range (130-139/85-89 mm Hg).  People who are overweight or obese.  People who are African American.  If you are 59-24 years of age, have your blood pressure checked every 3-5 years. If you are 34 years of age or older, have your blood pressure checked every year. You should have your blood pressure measured twice-once when you are at a hospital or clinic, and once when you are not at a hospital or clinic. Record the average of the two measurements. To check your blood pressure when you are not at a hospital or clinic, you can use:  An automated blood pressure machine at a pharmacy.  A home blood pressure monitor.  If you are between 29 years and 60 years old, ask your health care provider if you should take aspirin to prevent strokes.  Have regular diabetes screenings. This involves taking a blood sample to check your fasting blood sugar level.  If you are at a normal weight and have a low risk for diabetes, have this test once  every three years after 53 years of age.  If you are overweight and have a high risk for diabetes, consider being tested at a younger age or more often. Preventing infection Hepatitis B  If you have a higher risk for hepatitis B, you should be screened for this virus. You are considered at high risk for hepatitis B if:  You were born in a country where hepatitis B is common. Ask your health care provider which countries are considered high risk.  Your parents were born in a high-risk country, and you have not been immunized against hepatitis B (hepatitis B vaccine).  You have HIV or AIDS.  You use needles to inject street drugs.  You live with someone who has hepatitis B.  You have had sex with someone who has hepatitis B.  You get hemodialysis treatment.  You take certain medicines for conditions, including cancer, organ transplantation, and autoimmune conditions. Hepatitis C  Blood testing is recommended for:  Everyone born from [birth year] through 1967.  Anyone with known risk factors for hepatitis C. Sexually transmitted infections (STIs)  You should be screened for sexually transmitted infections (STIs) including gonorrhea and chlamydia if:  You are sexually active and are younger than 53 years of age.  You are older than 53 years of age and your health care provider tells you that you are at risk for this type of infection.  Your sexual activity has changed since you were last screened and you are at an increased risk for chlamydia or gonorrhea. Ask your health care provider if you are at risk.  If you do not have HIV, but are at risk, it may be recommended that you take a prescription medicine daily to prevent HIV infection. This is called pre-exposure prophylaxis (PrEP). You are considered at risk if:  You are sexually active and do not regularly use condoms or know the HIV status of your partner(s).  You take drugs by injection.  You are sexually active with a partner  who has HIV. Talk with your health care provider about whether you are at high risk of being infected with HIV. If you choose to begin PrEP, you should first be tested for HIV. You should then be tested every 3 months for as long as you are taking PrEP. Pregnancy  If you are premenopausal and you may become pregnant, ask your health care provider about preconception counseling.  If you may become pregnant, take 400 to 800 micrograms (mcg) of folic acid  every day.  If you want to prevent pregnancy, talk to your health care provider about birth control (contraception). Osteoporosis and menopause  Osteoporosis is a disease in which the bones lose minerals and strength with aging. This can result in serious bone fractures. Your risk for osteoporosis can be identified using a bone density scan.  If you are 4 years of age or older, or if you are at risk for osteoporosis and fractures, ask your health care provider if you should be screened.  Ask your health care provider whether you should take a calcium or vitamin D supplement to lower your risk for osteoporosis.  Menopause may have certain physical symptoms and risks.  Hormone replacement therapy may reduce some of these symptoms and risks. Talk to your health care provider about whether hormone replacement therapy is right for you. Follow these instructions at home:  Schedule regular health, dental, and eye exams.  Stay current with your immunizations.  Do not use any tobacco products including cigarettes, chewing tobacco, or electronic cigarettes.  If you are pregnant, do not drink alcohol.  If you are breastfeeding, limit how much and how often you drink alcohol.  Limit alcohol intake to no more than 1 drink per day for nonpregnant women. One drink equals 12 ounces of beer, 5 ounces of wine, or 1 ounces of hard liquor.  Do not use street drugs.  Do not share needles.  Ask your health care provider for help if you need support  or information about quitting drugs.  Tell your health care provider if you often feel depressed.  Tell your health care provider if you have ever been abused or do not feel safe at home. This information is not intended to replace advice given to you by your health care provider. Make sure you discuss any questions you have with your health care provider. Document Released: 06/21/2011 Document Revised: 05/13/2016 Document Reviewed: 09/09/2015 Elsevier Interactive Patient Education  2017 [employer].

## 2017-05-10 ENCOUNTER — Telehealth: Payer: Self-pay | Admitting: Family

## 2017-05-10 LAB — CMP14+EGFR
A/G RATIO: 1.6 (ref 1.2–2.2)
ALBUMIN: 4.5 g/dL (ref 3.5–5.5)
ALT: 34 IU/L — ABNORMAL HIGH (ref 0–32)
AST: 25 IU/L (ref 0–40)
Alkaline Phosphatase: 120 IU/L — ABNORMAL HIGH (ref 39–117)
BILIRUBIN TOTAL: 0.5 mg/dL (ref 0.0–1.2)
BUN / CREAT RATIO: 9 (ref 9–23)
BUN: 8 mg/dL (ref 6–24)
CHLORIDE: 102 mmol/L (ref 96–106)
CO2: 25 mmol/L (ref 18–29)
Calcium: 9.8 mg/dL (ref 8.7–10.2)
Creatinine, Ser: 0.85 mg/dL (ref 0.57–1.00)
GFR calc non Af Amer: 79 mL/min/{1.73_m2} (ref >59)
GFR, EST AFRICAN AMERICAN: 91 mL/min/{1.73_m2} (ref >59)
GLOBULIN, TOTAL: 2.8 g/dL (ref 1.5–4.5)
Glucose: 113 mg/dL — ABNORMAL HIGH (ref 65–99)
Potassium: 4.5 mmol/L (ref 3.5–5.2)
Sodium: 143 mmol/L (ref 134–144)
TOTAL PROTEIN: 7.3 g/dL (ref 6.0–8.5)

## 2017-05-10 LAB — LIPID PANEL
Chol/HDL Ratio: 3.3 ratio (ref 0.0–4.4)
Cholesterol, Total: 192 mg/dL (ref 100–199)
HDL: 58 mg/dL (ref >39)
LDL Calculated: 89 mg/dL (ref 0–99)
Triglycerides: 223 mg/dL — ABNORMAL HIGH (ref 0–149)
VLDL CHOLESTEROL CAL: 45 mg/dL — AB (ref 5–40)

## 2017-05-10 NOTE — Telephone Encounter (Signed)
Patient aware of results.

## 2017-05-18 ENCOUNTER — Ambulatory Visit (INDEPENDENT_AMBULATORY_CARE_PROVIDER_SITE_OTHER): Payer: Medicare Other | Admitting: Pediatrics

## 2017-05-18 ENCOUNTER — Telehealth: Payer: Self-pay | Admitting: Family

## 2017-05-18 ENCOUNTER — Encounter: Payer: Self-pay | Admitting: Pediatrics

## 2017-05-18 ENCOUNTER — Ambulatory Visit: Payer: Medicare Other | Admitting: Pharmacist

## 2017-05-18 VITALS — BP 127/76 | HR 84 | Temp 97.7°F | Ht 62.0 in | Wt 140.2 lb

## 2017-05-18 DIAGNOSIS — K0889 Other specified disorders of teeth and supporting structures: Secondary | ICD-10-CM

## 2017-05-18 DIAGNOSIS — M5442 Lumbago with sciatica, left side: Secondary | ICD-10-CM

## 2017-05-18 MED ORDER — IBUPROFEN 800 MG PO TABS: 800.0000 mg | ORAL_TABLET | Freq: Three times a day (TID) | ORAL | 2 refills | Status: DC | PRN

## 2017-05-18 MED ORDER — AMOXICILLIN-POT CLAVULANATE 875-125 MG PO TABS: 1.0000 | ORAL_TABLET | Freq: Two times a day (BID) | ORAL | 0 refills | Status: DC

## 2017-05-18 NOTE — Patient Instructions (Signed)
Start antibiotic if you start to have swelling, increased pain in tooth Avoid very cold or hot foods as this will make the tooth more sensitive Call dentist to get in to be seen to get cavity filled

## 2017-05-18 NOTE — Telephone Encounter (Signed)
appt scheduled Pt notified 

## 2017-05-18 NOTE — Progress Notes (Signed)
  Subjective:   Patient ID: Sandra Adams, female    DOB: [birth year], 51 y.o.   MRN: [id number] CC: Dental Pain (x 1 day)  HPI: Sandra Adams is a 51 y.o. female presenting for Dental Pain (x 1 day)  Has a cavity that needs to be filled, hasnt been back in to get filled, dentist was recommending filling first and pt couldn't afford.  Last saw dentist 1-2 months ago Now with increasing pain at times L upper back molar Pain comes and goes, not there all the time Food often makes it worse Gargling with warm salt water made it worse Tried cold compresses, tramadol that she is prescribed, helps some but not last night Tried clover oil at home  Relevant past medical, surgical, family and social history reviewed. Allergies and medications reviewed and updated. History  Smoking Status  . Never Smoker  Smokeless Tobacco  . Never Used   ROS: Per HPI   Objective:    BP 127/76   Pulse 84   Temp 97.7 F (36.5 C) (Oral)   Ht 5\' 2"  (1.575 m)   Wt 140 lb 3.2 oz (63.6 kg)   LMP 10/16/2014   BMI 25.64 kg/m   Wt Readings from Last 3 Encounters:  05/18/17 140 lb 3.2 oz (63.6 kg)  05/09/17 138 lb (62.6 kg)  05/04/17 140 lb (63.5 kg)   Gen: NAD, alert, cooperative with exam, NCAT EYES: EOMI, no conjunctival injection, or no icterus ENT: L TM slightly injected, R TM normal, OP without erythema, L upper back molar with darkened tooth, no tenderness, swelling, fluctuance in inner or outer gum over tooth LYMPH: no cervical LAD CV: NRRR, normal S1/S2, no murmur, distal pulses 2+ b/l Resp: CTABL, no wheezes, normal WOB Ext: No edema, warm Neuro: Alert and oriented  Assessment & Plan:  Sandra Adams was seen today for dental pain.  Diagnoses and all orders for this visit:  Tooth pain Discussed with pt, needs to get back to see dentist to get cavity filled No fluctuance now, no swelling If worsening pain, OK to start antibiotic Can use cool compresses, ibuprofen Avoid hot and cold foods  if it makes pain worse -     ibuprofen (ADVIL,MOTRIN) 800 MG tablet; Take 1 tablet (800 mg total) by mouth every 8 (eight) hours as needed. -     amoxicillin-clavulanate (AUGMENTIN) 875-125 MG tablet; Take 1 tablet by mouth 2 (two) times daily.  Follow up plan: As needed Sandra Found, MD [place]

## 2017-05-18 NOTE — Telephone Encounter (Signed)
Appointment scheduled for pm clinic.

## 2017-06-03 ENCOUNTER — Ambulatory Visit (INDEPENDENT_AMBULATORY_CARE_PROVIDER_SITE_OTHER): Payer: Medicare Other | Admitting: Family Medicine

## 2017-06-03 ENCOUNTER — Encounter: Payer: Self-pay | Admitting: Family Medicine

## 2017-06-03 VITALS — BP 112/74 | HR 91 | Temp 97.6°F | Ht 62.0 in | Wt 140.0 lb

## 2017-06-03 DIAGNOSIS — K449 Diaphragmatic hernia without obstruction or gangrene: Secondary | ICD-10-CM | POA: Diagnosis not present

## 2017-06-03 DIAGNOSIS — R5383 Other fatigue: Secondary | ICD-10-CM | POA: Diagnosis not present

## 2017-06-03 DIAGNOSIS — R531 Weakness: Secondary | ICD-10-CM

## 2017-06-03 DIAGNOSIS — R5381 Other malaise: Secondary | ICD-10-CM | POA: Diagnosis not present

## 2017-06-03 DIAGNOSIS — N898 Other specified noninflammatory disorders of vagina: Secondary | ICD-10-CM | POA: Diagnosis not present

## 2017-06-03 LAB — MICROSCOPIC EXAMINATION: Renal Epithel, UA: NONE SEEN /hpf

## 2017-06-03 LAB — URINALYSIS, COMPLETE
Bilirubin, UA: NEGATIVE
Glucose, UA: NEGATIVE
KETONES UA: NEGATIVE
NITRITE UA: NEGATIVE
Protein, UA: NEGATIVE
SPEC GRAV UA: 1.025 (ref 1.005–1.030)
UUROB: 0.2 mg/dL (ref 0.2–1.0)
pH, UA: 6 (ref 5.0–7.5)

## 2017-06-03 LAB — WET PREP FOR TRICH, YEAST, CLUE
CLUE CELL EXAM: NEGATIVE
TRICHOMONAS EXAM: NEGATIVE
YEAST EXAM: NEGATIVE

## 2017-06-03 MED ORDER — PANTOPRAZOLE SODIUM 40 MG PO TBEC: 40.0000 mg | DELAYED_RELEASE_TABLET | Freq: Every day | ORAL | 11 refills | Status: DC

## 2017-06-03 NOTE — Progress Notes (Signed)
Subjective:  Patient ID: [name], female    DOB: 09-11-1966  Age: 51 y.o. MRN: [id number]  CC: Fatigue   HPI REEM FLEURY presents for Increasing weakness tiredness fatigue for the last couple of weeks. She has had some intermittent vaginal discharge. but also some malaise and arthralgias scattered. No fever chills or sweats. Symptoms have been waxing and waning. No instigating illness. That is no upper respiratory symptoms. She reports that her hiatal hernia has been acting up a bit and she is having more heartburn. But she denies melena and hematochezia as well as abdominal pain. Patient says that her brain sometimes feels it's in the follow-up. She denies use of any illicit substance   History Shenetta has a past medical history of Anxiety; Depression; Menopausal symptom; Migraine; and Scoliosis.   She has a past surgical history that includes Knee surgery (Left, 1974 and 1979).   Her family history includes Anxiety disorder in her sister; COPD in her father, maternal aunt, and sister; Cancer in her father; Diabetes in her maternal aunt and maternal uncle; Epilepsy in her mother; Heart disease in her father; Hyperlipidemia in her father.She reports that she has never smoked. She has never used smokeless tobacco. She reports that she does not drink alcohol or use drugs.    ROS Review of Systems  Constitutional: Negative for activity change, appetite change and fever.  HENT: Negative for congestion, rhinorrhea and sore throat.   Eyes: Negative for visual disturbance.  Respiratory: Negative for cough and shortness of breath.   Cardiovascular: Negative for chest pain and palpitations.  Gastrointestinal: Negative for abdominal pain, diarrhea and nausea.  Genitourinary: Negative for dysuria.  Musculoskeletal: Negative for arthralgias and myalgias.    Objective:  BP 112/74   Pulse 91   Temp 97.6 F (36.4 C) (Oral)   Ht _0  (1.575 m)   Wt 140 lb (63.5 kg)   LMP  10/16/2014   BMI 25.61 kg/m   BP Readings from Last 3 Encounters:  06/03/17 112/74  05/18/17 127/76  05/09/17 121/78    Wt Readings from Last 3 Encounters:  06/03/17 140 lb (63.5 kg)  05/18/17 140 lb 3.2 oz (63.6 kg)  05/09/17 138 lb (62.6 kg)     Physical Exam  Constitutional: She is oriented to person, place, and time. She appears well-developed and well-nourished. No distress.  HENT:  Head: Normocephalic and atraumatic.  Right Ear: External ear normal.  Left Ear: External ear normal.  Nose: Nose normal.  Mouth/Throat: Oropharynx is clear and moist.  Eyes: Conjunctivae and EOM are normal. Pupils are equal, round, and reactive to light.  Neck: Normal range of motion. Neck supple. No thyromegaly present.  Cardiovascular: Normal rate, regular rhythm and normal heart sounds.   No murmur heard. Pulmonary/Chest: Effort normal and breath sounds normal. No respiratory distress. She has no wheezes. She has no rales.  Abdominal: Soft. Bowel sounds are normal. She exhibits no distension. There is no tenderness.  Lymphadenopathy:    She has no cervical adenopathy.  Neurological: She is alert and oriented to person, place, and time. She has normal reflexes.  Skin: Skin is warm and dry.  Psychiatric: Thought content normal. Her affect is blunt. Her speech is delayed. She is slowed. Cognition and memory are normal.      Assessment & Plan:   Francille was seen today for fatigue.  Diagnoses and all orders for this visit:  Vaginal discharge -     Urinalysis, Complete -  WET PREP FOR TRICH, YEAST, CLUE  Weakness -     CBC with Differential/Platelet -     CMP14+EGFR -     TSH + free T4 -     Sedimentation rate -     Arthritis Panel  Malaise and fatigue -     CBC with Differential/Platelet -     CMP14+EGFR -     TSH + free T4 -     Sedimentation rate -     Arthritis Panel  Hiatal hernia -     CBC with Differential/Platelet -     CMP14+EGFR -     TSH + free T4 -      Sedimentation rate -     Arthritis Panel  Other orders -     pantoprazole (PROTONIX) 40 MG tablet; Take 1 tablet (40 mg total) by mouth daily. For stomach       I have discontinued Ms. Alberto's amoxicillin-clavulanate. I am also having her start on pantoprazole. Additionally, I am having her maintain her fluticasone, PRO-BIOTIC BLEND, fluocinonide-emollient, estradiol, vitamin E, medroxyPROGESTERone, ALPRAZolam, traMADol-acetaminophen, and ibuprofen.  Allergies as of 06/03/2017      Reactions   Ciprofloxacin    Makes her feel weak,dizzy   Imitrex [sumatriptan]    Blurred vision   Prednisone Nausea And Vomiting   Tylenol [acetaminophen]    Tylenol #3,Headaches   Vicodin [hydrocodone-acetaminophen]    GI upset      Medication List       Accurate as of 06/03/17  3:33 PM. Always use your most recent med list.          ALPRAZolam 0.25 MG tablet Commonly known as:  XANAX TAKE 1 TABLET BY MOUTH AT BEDTIME AS NEEDED FOR ANXIETY   estradiol 1 MG tablet Commonly known as:  ESTRACE Take 1 tablet (1 mg total) by mouth daily.   fluocinonide-emollient 0.05 % cream Commonly known as:  LIDEX-E Apply 1 application topically 2 (two) times daily. To affected areas   fluticasone 50 MCG/ACT nasal spray Commonly known as:  FLONASE Place 2 sprays into both nostrils daily.   ibuprofen 800 MG tablet Commonly known as:  ADVIL,MOTRIN Take 1 tablet (800 mg total) by mouth every 8 (eight) hours as needed.   medroxyPROGESTERone 10 MG tablet Commonly known as:  PROVERA Take 1 tablet (10 mg total) by mouth daily.   pantoprazole 40 MG tablet Commonly known as:  PROTONIX Take 1 tablet (40 mg total) by mouth daily. For stomach   PRO-BIOTIC BLEND Caps Take 1 capsule by mouth daily.   traMADol-acetaminophen 37.5-325 MG tablet Commonly known as:  ULTRACET TAKE 2 TABLETS BY MOUTH EVERY 6 TO 8 HOURS AS NEEDED MAX 8/24 HRS   vitamin E 100 UNIT capsule Take 100 Units by mouth daily.         Follow-up: No Follow-up on file.  Claretta Fraise, M.D.

## 2017-06-04 LAB — CMP14+EGFR
ALK PHOS: 114 IU/L (ref 39–117)
ALT: 36 IU/L — AB (ref 0–32)
AST: 25 IU/L (ref 0–40)
Albumin/Globulin Ratio: 1.8 (ref 1.2–2.2)
Albumin: 4.5 g/dL (ref 3.5–5.5)
BUN/Creatinine Ratio: 12 (ref 9–23)
BUN: 9 mg/dL (ref 6–24)
Bilirubin Total: 0.3 mg/dL (ref 0.0–1.2)
CO2: 25 mmol/L (ref 20–29)
CREATININE: 0.75 mg/dL (ref 0.57–1.00)
Calcium: 9.7 mg/dL (ref 8.7–10.2)
Chloride: 101 mmol/L (ref 96–106)
GFR calc Af Amer: 106 mL/min/{1.73_m2} (ref >59)
GFR, EST NON AFRICAN AMERICAN: 92 mL/min/{1.73_m2} (ref >59)
GLUCOSE: 79 mg/dL (ref 65–99)
Globulin, Total: 2.5 g/dL (ref 1.5–4.5)
Potassium: 4.4 mmol/L (ref 3.5–5.2)
Sodium: 143 mmol/L (ref 134–144)
Total Protein: 7 g/dL (ref 6.0–8.5)

## 2017-06-04 LAB — ARTHRITIS PANEL
Basophils Absolute: 0.1 10*3/uL (ref 0.0–0.2)
Basos: 1 %
EOS (ABSOLUTE): 0.3 10*3/uL (ref 0.0–0.4)
EOS: 2 %
Hematocrit: 40.3 % (ref 34.0–46.6)
Hemoglobin: 13.7 g/dL (ref 11.1–15.9)
Immature Grans (Abs): 0 10*3/uL (ref 0.0–0.1)
Immature Granulocytes: 0 %
Lymphocytes Absolute: 4.2 10*3/uL — ABNORMAL HIGH (ref 0.7–3.1)
Lymphs: 38 %
MCH: 32.5 pg (ref 26.6–33.0)
MCHC: 34 g/dL (ref 31.5–35.7)
MCV: 96 fL (ref 79–97)
MONOS ABS: 1.1 10*3/uL — AB (ref 0.1–0.9)
Monocytes: 10 %
NEUTROS PCT: 49 %
Neutrophils Absolute: 5.4 10*3/uL (ref 1.4–7.0)
PLATELETS: 243 10*3/uL (ref 150–379)
RBC: 4.22 x10E6/uL (ref 3.77–5.28)
RDW: 13.2 % (ref 12.3–15.4)
Sed Rate: 10 mm/hr (ref 0–40)
URIC ACID: 4.8 mg/dL (ref 2.5–7.1)
WBC: 11.1 10*3/uL — AB (ref 3.4–10.8)

## 2017-06-04 LAB — TSH+FREE T4
FREE T4: 1.26 ng/dL (ref 0.82–1.77)
TSH: 1.74 u[IU]/mL (ref 0.450–4.500)

## 2017-06-11 DIAGNOSIS — M79671 Pain in right foot: Secondary | ICD-10-CM | POA: Diagnosis not present

## 2017-06-13 ENCOUNTER — Encounter: Payer: Medicare Other | Admitting: Pharmacist

## 2017-06-14 ENCOUNTER — Encounter: Payer: Self-pay | Admitting: Family

## 2017-06-29 ENCOUNTER — Telehealth: Payer: Self-pay | Admitting: Family Medicine

## 2017-06-29 ENCOUNTER — Telehealth: Payer: Self-pay | Admitting: Family

## 2017-06-29 ENCOUNTER — Ambulatory Visit: Payer: Self-pay | Admitting: Urology

## 2017-06-29 NOTE — Telephone Encounter (Signed)
FYI

## 2017-06-30 NOTE — Telephone Encounter (Signed)
Normal kidney function, negative rheumatoid arthritis labs.

## 2017-07-01 NOTE — Telephone Encounter (Signed)
Pt notified of results Verbalizes understanding 

## 2017-07-05 ENCOUNTER — Encounter: Payer: Medicare Other | Admitting: Pharmacist

## 2017-07-06 ENCOUNTER — Encounter: Payer: Self-pay | Admitting: Family

## 2017-07-12 ENCOUNTER — Ambulatory Visit (INDEPENDENT_AMBULATORY_CARE_PROVIDER_SITE_OTHER): Payer: Medicare Other | Admitting: Physician Assistant

## 2017-07-12 ENCOUNTER — Encounter: Payer: Self-pay | Admitting: Physician Assistant

## 2017-07-12 VITALS — BP 123/80 | HR 68 | Temp 97.7°F | Ht 62.0 in | Wt 139.8 lb

## 2017-07-12 DIAGNOSIS — L299 Pruritus, unspecified: Secondary | ICD-10-CM

## 2017-07-12 DIAGNOSIS — W57XXXA Bitten or stung by nonvenomous insect and other nonvenomous arthropods, initial encounter: Secondary | ICD-10-CM | POA: Diagnosis not present

## 2017-07-12 DIAGNOSIS — S30861A Insect bite (nonvenomous) of abdominal wall, initial encounter: Secondary | ICD-10-CM

## 2017-07-12 MED ORDER — CETIRIZINE HCL 10 MG PO CHEW: 10.0000 mg | CHEWABLE_TABLET | Freq: Every day | ORAL | 0 refills | Status: DC

## 2017-07-12 NOTE — Patient Instructions (Signed)
Insect Bite, Adult An insect bite can make your skin red, itchy, and swollen. Some insects can spread disease to people with a bite. However, most insect bites do not lead to disease, and most are not serious. Follow these instructions at home: Bite area care  Do not scratch the bite area.  Keep the bite area clean and dry.  Wash the bite area every day with soap and water as told by your doctor.  Check the bite area every day for signs of infection. Check for: ? More redness, swelling, or pain. ? Fluid or blood. ? Warmth. ? Pus. Managing pain, itching, and swelling  You may put any of these on the bite area as told by your doctor: ? A baking soda paste. ? Cortisone cream. ? Calamine lotion.  If directed, put ice on the bite area. ? Put ice in a plastic bag. ? Place a towel between your skin and the bag. ? Leave the ice on for 20 minutes, 2-3 times a day. Medicines  Take medicines or put medicines on your skin only as told by your doctor.  If you were prescribed an antibiotic medicine, use it as told by your doctor. Do not stop using the antibiotic even if your condition improves. General instructions  Keep all follow-up visits as told by your doctor. This is important. How is this prevented? To help you have a lower risk of insect bites:  When you are outside, wear clothing that covers your arms and legs.  Use insect repellent. The best insect repellents have: ? An active ingredient of DEET, picaridin, oil of lemon eucalyptus (OLE), or IR3535. ? Higher amounts of DEET or another active ingredient than other repellents have.  If your home windows do not have screens, think about putting some in.  Contact a doctor if:  You have more redness, swelling, or pain in the bite area.  You have fluid, blood, or pus coming from the bite area.  The bite area feels warm.  You have a fever. Get help right away if:  You have joint pain.  You have a rash.  You have  shortness of breath.  You feel more tired or sleepy than you normally do.  You have neck pain.  You have a headache.  You feel weaker than you normally do.  You have chest pain.  You have pain in your belly.  You feel sick to your stomach (nauseous) or you throw up (vomit). Summary  An insect bite can make your skin red, itchy, and swollen.  Do not scratch the bite area, and keep it clean and dry.  Ice can help with pain and itching from the bite. This information is not intended to replace advice given to you by your health care provider. Make sure you discuss any questions you have with your health care provider. Document Released: 12/03/2000 Document Revised: 07/08/2016 Document Reviewed: 04/23/2015 Elsevier Interactive Patient Education  2018 [employer]  

## 2017-07-12 NOTE — Progress Notes (Signed)
BP 123/80   Pulse 68   Temp 97.7 F (36.5 C) (Oral)   Ht 5\' 2"  (1.575 m)   Wt 139 lb 12.8 oz (63.4 kg)   LMP 10/16/2014   BMI 25.57 kg/m    Subjective:    Patient ID: Sandra Adams, female    DOB: Feb 02, 1966, 51 y.o.   MRN: [id number]  HPI: Sandra Adams is a 51 y.o. female presenting on 07/12/2017 for Insect Bite (right side)  Just a few days the patient has had 2 lesions on her right flank that have been itching. She does not remember being bit however there is significant itching around the red areas and she has an area on the inside of her left arm. She doesn't know of any insects in her home that she has seen. She does not have a dog to be contacted.  Relevant past medical, surgical, family and social history reviewed and updated as indicated. Allergies and medications reviewed and updated.  Past Medical History:  Diagnosis Date  . Anxiety   . Depression   . Menopausal symptom   . Migraine   . Scoliosis     Past Surgical History:  Procedure Laterality Date  . KNEE SURGERY Left 1974 and 1979    Review of Systems  Constitutional: Negative.   HENT: Negative.   Eyes: Negative.   Respiratory: Negative.   Gastrointestinal: Negative.   Genitourinary: Negative.   Skin: Positive for color change and wound.    Allergies as of 07/12/2017      Reactions   Ciprofloxacin    Makes her feel weak,dizzy   Imitrex [sumatriptan]    Blurred vision   Prednisone Nausea And Vomiting   Tylenol [acetaminophen]    Tylenol #3,Headaches   Vicodin [hydrocodone-acetaminophen]    GI upset      Medication List       Accurate as of 07/12/17  9:37 AM. Always use your most recent med list.          ALPRAZolam 0.25 MG tablet Commonly known as:  XANAX TAKE 1 TABLET BY MOUTH AT BEDTIME AS NEEDED FOR ANXIETY   cetirizine 10 MG chewable tablet Commonly known as:  ZYRTEC Chew 1 tablet (10 mg total) by mouth daily.   estradiol 1 MG tablet Commonly known as:  ESTRACE Take 1  tablet (1 mg total) by mouth daily.   fluocinonide-emollient 0.05 % cream Commonly known as:  LIDEX-E Apply 1 application topically 2 (two) times daily. To affected areas   fluticasone 50 MCG/ACT nasal spray Commonly known as:  FLONASE Place 2 sprays into both nostrils daily.   ibuprofen 800 MG tablet Commonly known as:  ADVIL,MOTRIN Take 1 tablet (800 mg total) by mouth every 8 (eight) hours as needed.   medroxyPROGESTERone 10 MG tablet Commonly known as:  PROVERA Take 1 tablet (10 mg total) by mouth daily.   pantoprazole 40 MG tablet Commonly known as:  PROTONIX Take 1 tablet (40 mg total) by mouth daily. For stomach   PRO-BIOTIC BLEND Caps Take 1 capsule by mouth daily.   traMADol-acetaminophen 37.5-325 MG tablet Commonly known as:  ULTRACET TAKE 2 TABLETS BY MOUTH EVERY 6 TO 8 HOURS AS NEEDED MAX 8/24 HRS          Objective:    BP 123/80   Pulse 68   Temp 97.7 F (36.5 C) (Oral)   Ht 5\' 2"  (1.575 m)   Wt 139 lb 12.8 oz (63.4 kg)   LMP 10/16/2014  BMI 25.57 kg/m   Allergies  Allergen Reactions  . Ciprofloxacin     Makes her feel weak,dizzy  . Imitrex [Sumatriptan]     Blurred vision   . Prednisone Nausea And Vomiting  . Tylenol [Acetaminophen]     Tylenol #3,Headaches  . Vicodin [Hydrocodone-Acetaminophen]     GI upset    Physical Exam  Constitutional: She is oriented to person, place, and time. She appears well-developed and well-nourished.  HENT:  Head: Normocephalic and atraumatic.  Eyes: Pupils are equal, round, and reactive to light. Conjunctivae and EOM are normal.  Cardiovascular: Normal rate, regular rhythm, normal heart sounds and intact distal pulses.   Pulmonary/Chest: Effort normal and breath sounds normal.  Abdominal: Soft. Bowel sounds are normal.  Neurological: She is alert and oriented to person, place, and time. She has normal reflexes.  Skin: Skin is warm and dry. Rash noted. Rash is papular. There is erythema.     3 papular  erythematous lesions, 2 on trunk and one on left arm. There is surrounding petechiae from her scratching.  Psychiatric: She has a normal mood and affect. Her behavior is normal. Judgment and thought content normal.  Nursing note and vitals reviewed.       Assessment & Plan:   1. Insect bite, initial encounter Ice to area  Lidex BID Zyrtec 10 mg one daily  2. Itching See above   Current Outpatient Prescriptions:  .  ALPRAZolam (XANAX) 0.25 MG tablet, TAKE 1 TABLET BY MOUTH AT BEDTIME AS NEEDED FOR ANXIETY, Disp: 30 tablet, Rfl: 3 .  estradiol (ESTRACE) 1 MG tablet, Take 1 tablet (1 mg total) by mouth daily., Disp: 90 tablet, Rfl: 4 .  fluocinonide-emollient (LIDEX-E) 0.05 % cream, Apply 1 application topically 2 (two) times daily. To affected areas, Disp: 60 g, Rfl: 5 .  fluticasone (FLONASE) 50 MCG/ACT nasal spray, Place 2 sprays into both nostrils daily., Disp: 16 g, Rfl: 6 .  ibuprofen (ADVIL,MOTRIN) 800 MG tablet, Take 1 tablet (800 mg total) by mouth every 8 (eight) hours as needed., Disp: 30 tablet, Rfl: 2 .  medroxyPROGESTERone (PROVERA) 10 MG tablet, Take 1 tablet (10 mg total) by mouth daily., Disp: 10 tablet, Rfl: 3 .  pantoprazole (PROTONIX) 40 MG tablet, Take 1 tablet (40 mg total) by mouth daily. For stomach, Disp: 30 tablet, Rfl: 11 .  Probiotic Product (PRO-BIOTIC BLEND) CAPS, Take 1 capsule by mouth daily., Disp: 30 capsule, Rfl: 1 .  traMADol-acetaminophen (ULTRACET) 37.5-325 MG tablet, TAKE 2 TABLETS BY MOUTH EVERY 6 TO 8 HOURS AS NEEDED MAX 8/24 HRS, Disp: 30 tablet, Rfl: 2 .  cetirizine (ZYRTEC) 10 MG chewable tablet, Chew 1 tablet (10 mg total) by mouth daily., Disp: 30 tablet, Rfl: 0  Continue all other maintenance medications as listed above.  Follow up plan: Return if symptoms worsen or fail to improve.  Educational handout given for insect bite  Terald Sleeper PA-C [place] [address]  [place],   [address] [phone]   07/12/2017, 9:37 AM

## 2017-07-18 DIAGNOSIS — H6982 Other specified disorders of Eustachian tube, left ear: Secondary | ICD-10-CM | POA: Diagnosis not present

## 2017-07-18 DIAGNOSIS — G529 Cranial nerve disorder, unspecified: Secondary | ICD-10-CM | POA: Diagnosis not present

## 2017-07-21 ENCOUNTER — Encounter: Payer: Self-pay | Admitting: Family

## 2017-07-21 ENCOUNTER — Ambulatory Visit (INDEPENDENT_AMBULATORY_CARE_PROVIDER_SITE_OTHER): Payer: Medicare Other | Admitting: Pharmacist

## 2017-07-21 ENCOUNTER — Ambulatory Visit (INDEPENDENT_AMBULATORY_CARE_PROVIDER_SITE_OTHER): Payer: Medicare Other | Admitting: Family

## 2017-07-21 VITALS — BP 124/85 | HR 81 | Temp 97.6°F | Ht 62.0 in | Wt 138.0 lb

## 2017-07-21 DIAGNOSIS — H938X1 Other specified disorders of right ear: Secondary | ICD-10-CM | POA: Diagnosis not present

## 2017-07-21 DIAGNOSIS — M8580 Other specified disorders of bone density and structure, unspecified site: Secondary | ICD-10-CM | POA: Insufficient documentation

## 2017-07-21 DIAGNOSIS — J301 Allergic rhinitis due to pollen: Secondary | ICD-10-CM

## 2017-07-21 DIAGNOSIS — M8589 Other specified disorders of bone density and structure, multiple sites: Secondary | ICD-10-CM

## 2017-07-21 MED ORDER — MONTELUKAST SODIUM 10 MG PO TABS: 10.0000 mg | ORAL_TABLET | Freq: Every day | ORAL | 1 refills | Status: DC

## 2017-07-21 NOTE — Progress Notes (Signed)
Patient ID: Sandra Adams, female   DOB: [birth year], 51 y.o.   MRN: [id number]   Patient referred by PCP to discussed DEXA results from 05/09/2017.  However she reports that she has been experiencing fullness in her right ear with decreased hearing and dizziness.  She went to urgent care 07/18/2017 and they prescribed a medrol dose pack.   Reviewed DEXA results  Patient has osteopenia Recommended calcium 600mg  + D qd along with dietary calcium intake.  Increase weight bearing exercise Fall prevention discussed Patient triage to see PCP for evaluation of dizziness / ear fullness and decreased hearing.  Cherre Robins, PharmD, CPP, CDE

## 2017-07-21 NOTE — Patient Instructions (Signed)
Allergic Rhinitis Allergic rhinitis is when the mucous membranes in the nose respond to allergens. Allergens are particles in the air that cause your body to have an allergic reaction. This causes you to release allergic antibodies. Through a chain of events, these eventually cause you to release histamine into the blood stream. Although meant to protect the body, it is this release of histamine that causes your discomfort, such as frequent sneezing, congestion, and an itchy, runny nose. What are the causes? Seasonal allergic rhinitis (hay fever) is caused by pollen allergens that may come from grasses, trees, and weeds. Year-round allergic rhinitis (perennial allergic rhinitis) is caused by allergens such as house dust mites, pet dander, and mold spores. What are the signs or symptoms?  Nasal stuffiness (congestion).  Itchy, runny nose with sneezing and tearing of the eyes. How is this diagnosed? Your health care provider can help you determine the allergen or allergens that trigger your symptoms. If you and your health care provider are unable to determine the allergen, skin or blood testing may be used. Your health care provider will diagnose your condition after taking your health history and performing a physical exam. Your health care provider may assess you for other related conditions, such as asthma, pink eye, or an ear infection. How is this treated? Allergic rhinitis does not have a cure, but it can be controlled by:  Medicines that block allergy symptoms. These may include allergy shots, nasal sprays, and oral antihistamines.  Avoiding the allergen. Hay fever may often be treated with antihistamines in pill or nasal spray forms. Antihistamines block the effects of histamine. There are over-the-counter medicines that may help with nasal congestion and swelling around the eyes. Check with your health care provider before taking or giving this medicine. If avoiding the allergen or the  medicine prescribed do not work, there are many new medicines your health care provider can prescribe. Stronger medicine may be used if initial measures are ineffective. Desensitizing injections can be used if medicine and avoidance does not work. Desensitization is when a patient is given ongoing shots until the body becomes less sensitive to the allergen. Make sure you follow up with your health care provider if problems continue. Follow these instructions at home: It is not possible to completely avoid allergens, but you can reduce your symptoms by taking steps to limit your exposure to them. It helps to know exactly what you are allergic to so that you can avoid your specific triggers. Contact a health care provider if:  You have a fever.  You develop a cough that does not stop easily (persistent).  You have shortness of breath.  You start wheezing.  Symptoms interfere with normal daily activities. This information is not intended to replace advice given to you by your health care provider. Make sure you discuss any questions you have with your health care provider. Document Released: 08/31/2001 Document Revised: 08/06/2016 Document Reviewed: 08/13/2013 Elsevier Interactive Patient Education  2017 Elsevier Inc.  

## 2017-07-21 NOTE — Progress Notes (Signed)
   Subjective:    Patient ID: Sandra Adams, female    DOB: Nov 21, 1966, 51 y.o.   MRN: [id number]  PT presents to the office today with ear fullness. Pt went to the Urgent Care on 07/18/17 and started on medrol dose pack and flonase. PT states her ear is better.  Ear Fullness   There is pain in the right ear. This is a recurrent problem. The current episode started 1 to 4 weeks ago. The problem occurs every few minutes. The problem has been waxing and waning. The pain is at a severity of 3/10. The pain is mild. Associated symptoms include coughing and headaches. Pertinent negatives include no ear discharge, hearing loss, rhinorrhea or sore throat. Abdominal pain: "once in awhile" Associated symptoms comments: Dizziness . Treatments tried: medrol dosepak and flonase. The treatment provided moderate relief.      Review of Systems  HENT: Negative for ear discharge, hearing loss, rhinorrhea and sore throat.   Respiratory: Positive for cough.   Gastrointestinal: Abdominal pain: "once in awhile"  Neurological: Positive for headaches.  All other systems reviewed and are negative.      Objective:   Physical Exam  Constitutional: She is oriented to person, place, and time. She appears well-developed and well-nourished. No distress.  HENT:  Head: Normocephalic.  Right Ear: External ear normal. A middle ear effusion is present.  Left Ear: External ear normal. A middle ear effusion is present.  Nose: Mucosal edema present.  Mouth/Throat: Posterior oropharyngeal erythema present.  Eyes: Pupils are equal, round, and reactive to light.  Neck: Normal range of motion. Neck supple. No thyromegaly present.  Cardiovascular: Normal rate, regular rhythm, normal heart sounds and intact distal pulses.   No murmur heard. Pulmonary/Chest: Effort normal and breath sounds normal. No respiratory distress. She has no wheezes.  Abdominal: Soft. Bowel sounds are normal. She exhibits no distension. There is no  tenderness.  Musculoskeletal: Normal range of motion. She exhibits no edema or tenderness.  Neurological: She is alert and oriented to person, place, and time.  Skin: Skin is warm and dry.  Psychiatric: She has a normal mood and affect. Her behavior is normal. Judgment and thought content normal.  Vitals reviewed.    BP 124/85   Pulse 81   Temp 97.6 F (36.4 C) (Oral)   Ht 5\' 2"  (1.575 m)   Wt 138 lb (62.6 kg)   LMP 10/16/2014   BMI 25.24 kg/m      Assessment & Plan:  1. Allergic rhinitis due to pollen, unspecified seasonality - montelukast (SINGULAIR) 10 MG tablet; Take 1 tablet (10 mg total) by mouth at bedtime.  Dispense: 90 tablet; Refill: 1  2. Sensation of fullness in right ear   Avoid allergens  Continue Flonase  Start Singulair  Cool Humidifier  RTO prn    [name], FNP

## 2017-07-26 DIAGNOSIS — N6002 Solitary cyst of left breast: Secondary | ICD-10-CM | POA: Diagnosis not present

## 2017-07-26 DIAGNOSIS — N6321 Unspecified lump in the left breast, upper outer quadrant: Secondary | ICD-10-CM | POA: Diagnosis not present

## 2017-08-08 ENCOUNTER — Other Ambulatory Visit: Payer: Self-pay | Admitting: Family

## 2017-08-08 DIAGNOSIS — F411 Generalized anxiety disorder: Secondary | ICD-10-CM

## 2017-08-08 NOTE — Telephone Encounter (Signed)
Last filled 06/06/17, last seen 07/21/17. Route to pool for call in

## 2017-08-09 NOTE — Telephone Encounter (Signed)
Phoned in.

## 2017-08-12 ENCOUNTER — Telehealth: Payer: Self-pay | Admitting: Family

## 2017-08-12 ENCOUNTER — Ambulatory Visit: Payer: Medicare Other | Admitting: Physician Assistant

## 2017-08-25 ENCOUNTER — Telehealth: Payer: Self-pay | Admitting: Family

## 2017-08-25 NOTE — Telephone Encounter (Signed)
I am not sure what medication she needs?

## 2017-08-25 NOTE — Telephone Encounter (Signed)
Left detailed message for pt to call and schedule appt for dysuria Also informed that Amberen is OTC

## 2017-08-25 NOTE — Telephone Encounter (Signed)
Patient aware it is otc med.

## 2017-08-26 ENCOUNTER — Telehealth: Payer: Self-pay | Admitting: Obstetrics and Gynecology

## 2017-08-26 ENCOUNTER — Ambulatory Visit (INDEPENDENT_AMBULATORY_CARE_PROVIDER_SITE_OTHER): Payer: Medicare Other | Admitting: Urology

## 2017-08-26 DIAGNOSIS — N3021 Other chronic cystitis with hematuria: Secondary | ICD-10-CM

## 2017-08-26 NOTE — Telephone Encounter (Signed)
Patient call returned, pt unavailable. Pt advised to call office prn concerns ar for appt.

## 2017-09-02 ENCOUNTER — Ambulatory Visit: Payer: Medicare Other | Admitting: Pediatrics

## 2017-09-05 DIAGNOSIS — H04123 Dry eye syndrome of bilateral lacrimal glands: Secondary | ICD-10-CM | POA: Diagnosis not present

## 2017-09-06 ENCOUNTER — Encounter: Payer: Self-pay | Admitting: Family

## 2017-09-08 ENCOUNTER — Other Ambulatory Visit: Payer: Self-pay | Admitting: Family

## 2017-09-08 DIAGNOSIS — F411 Generalized anxiety disorder: Secondary | ICD-10-CM

## 2017-09-08 NOTE — Telephone Encounter (Signed)
Last seen 07/21/17  Alyse Low   If approved route to nurse to call into [facility]: Lambert Rhonda W

## 2017-09-09 NOTE — Telephone Encounter (Signed)
Xanax called to [place] voice mail.

## 2017-09-15 ENCOUNTER — Encounter: Payer: Self-pay | Admitting: Family

## 2017-09-15 ENCOUNTER — Ambulatory Visit (INDEPENDENT_AMBULATORY_CARE_PROVIDER_SITE_OTHER): Payer: Medicare Other | Admitting: Family

## 2017-09-15 VITALS — BP 114/60 | HR 90 | Temp 98.0°F | Ht 62.0 in | Wt 141.0 lb

## 2017-09-15 DIAGNOSIS — L509 Urticaria, unspecified: Secondary | ICD-10-CM | POA: Diagnosis not present

## 2017-09-15 DIAGNOSIS — B078 Other viral warts: Secondary | ICD-10-CM

## 2017-09-15 MED ORDER — HYDROXYZINE PAMOATE 25 MG PO CAPS: 25.0000 mg | ORAL_CAPSULE | Freq: Three times a day (TID) | ORAL | 0 refills | Status: DC | PRN

## 2017-09-15 NOTE — Progress Notes (Signed)
   Subjective:    Patient ID: [name], female    DOB: 04-15-1966, 51 y.o.   MRN: [id number]  Rash  This is a new problem. The current episode started in the past 7 days. The problem has been waxing and waning since onset. The affected locations include the abdomen, right arm and chest. The rash is characterized by itchiness. She was exposed to chemicals. Pertinent negatives include no cough, diarrhea, eye pain, fever, joint pain, shortness of breath, sore throat or vomiting. Past treatments include anti-itch cream. The treatment provided mild relief.      Review of Systems  Constitutional: Negative for fever.  HENT: Negative for sore throat.   Eyes: Negative for pain.  Respiratory: Negative for cough and shortness of breath.   Gastrointestinal: Negative for diarrhea and vomiting.  Musculoskeletal: Negative for joint pain.  Skin: Positive for rash.  All other systems reviewed and are negative.      Objective:   Physical Exam  Constitutional: She is oriented to person, place, and time. She appears well-developed and well-nourished. No distress.  HENT:  Head: Normocephalic.  Eyes: Pupils are equal, round, and reactive to light.  Neck: Normal range of motion. Neck supple. No thyromegaly present.  Cardiovascular: Normal rate, regular rhythm, normal heart sounds and intact distal pulses.   No murmur heard. Pulmonary/Chest: Effort normal and breath sounds normal. No respiratory distress. She has no wheezes.  Abdominal: Soft. Bowel sounds are normal. She exhibits no distension. There is no tenderness.  Musculoskeletal: Normal range of motion. She exhibits no edema or tenderness.  Neurological: She is alert and oriented to person, place, and time. She has normal reflexes. No cranial nerve deficit.  Skin: Skin is warm and dry. Rash noted. Lesion: verruca vulgaris on right lateral arm. Rash is urticarial (bilateral  shoulder, right bicep , ecchymosis present from where pt has been  scratching).  Psychiatric: She has a normal mood and affect. Her behavior is normal. Judgment and thought content normal.  Vitals reviewed.   Cryotherapy used on wart on right arm. PT tolerated well.   BP 114/60   Pulse 90   Temp 98 F (36.7 C) (Oral)   Ht 5\' 2"  (1.575 m)   Wt 141 lb (64 kg)   LMP 10/16/2014   BMI 25.79 kg/m      Assessment & Plan:  1. Urticaria I believe this is related to the chemical pt used to dye her hair Do not scratch Start daily Zyrtec RTO prn or if symptoms do not improve - hydrOXYzine (VISTARIL) 25 MG capsule; Take 1 capsule (25 mg total) by mouth 3 (three) times daily as needed.  Dispense: 30 capsule; Refill: 0  2. Other viral warts Do not pick or squeeze    [name], FNP

## 2017-09-15 NOTE — Patient Instructions (Signed)
Hives Hives (urticaria) are itchy, red, swollen areas on your skin. Hives can appear on any part of your body and can vary in size. They can be as small as the tip of a pen or much larger. Hives often fade within 24 hours (acute hives). In other cases, new hives appear after old ones fade. This cycle can continue for several days or weeks (chronic hives). Hives result from your body's reaction to an irritant or to something that you are allergic to (trigger). When you are exposed to a trigger, your body releases a chemical (histamine) that causes redness, itching, and swelling. You can get hives immediately after being exposed to a trigger or hours later. Hives do not spread from person to person (are not contagious). Your hives may get worse with scratching, exercise, and emotional stress. What are the causes? Causes of this condition include:  Allergies to certain foods or ingredients.  Insect bites or stings.  Exposure to pollen or pet dander.  Contact with latex or chemicals.  Spending time in sunlight, heat, or cold (exposure).  Exercise.  Stress. You can also get hives from some medical conditions and treatments. These include:  Viruses, including the common cold.  Bacterial infections, such as urinary tract infections and strep throat.  Disorders such as vasculitis, lupus, or thyroid disease.  Certain medications.  Allergy shots.  Blood transfusions. Sometimes, the cause of hives is not known (idiopathic hives). What increases the risk? This condition is more likely to develop in:  Women.  People who have food allergies, especially to citrus fruits, milk, eggs, peanuts, tree nuts, or shellfish.  People who are allergic to:  Medicines.  Latex.  Insects.  Animals.  Pollen.  People who have certain medical conditions, includinglupus or thyroid disease. What are the signs or symptoms? The main symptom of this condition is raised, itchyred or white bumps or  patches on your skin. These areas may:  Become large and swollen (welts).  Change in shape and location, quickly and repeatedly.  Be separate hives or connect over a large area of skin.  Sting or become painful.  Turn white when pressed in the center (blanch). In severe cases, yourhands, feet, and face may also become swollen. This may occur if hives develop deeper in your skin. How is this diagnosed? This condition is diagnosed based on your symptoms, medical history, and physical exam. Your skin, urine, or blood may be tested to find out what is causing your hives and to rule out other health issues. Your health care provider may also remove a small sample of skin from the affected area and examine it under a microscope (biopsy). How is this treated? Treatment depends on the severity of your condition. Your health care provider may recommend using cool, wet cloths (cool compresses) or taking cool showers to relieve itching. Hives are sometimes treated with medicines, including:  Antihistamines.  Corticosteroids.  Antibiotics.  An injectable medicine (omalizumab). Your health care provider may prescribe this if you have chronic idiopathic hives and you continue to have symptoms even after treatment with antihistamines. Severe cases may require an emergency injection of adrenaline (epinephrine) to prevent a life-threatening allergic reaction (anaphylaxis). Follow these instructions at home: Medicines  Take or apply over-the-counter and prescription medicines only as told by your health care provider.  If you were prescribed an antibiotic medicine, use it as told by your health care provider. Do not stop taking the antibiotic even if you start to feel better. Skin Care    Apply cool compresses to the affected areas.  Do not scratch or rub your skin. General instructions  Do not take hot showers or baths. This can make itching worse.  Do not wear tight-fitting clothing.  Use  sunscreen and wear protective clothing when you are outside.  Avoid any substances that cause your hives. Keep a journal to help you track what causes your hives. Write down:  What medicines you take.  What you eat and drink.  What products you use on your skin.  Keep all follow-up visits as told by your health care provider. This is important. Contact a health care provider if:  Your symptoms are not controlled with medicine.  Your joints are painful or swollen. Get help right away if:  You have a fever.  You have pain in your abdomen.  Your tongue or lips are swollen.  Your eyelids are swollen.  Your chest or throat feels tight.  You have trouble breathing or swallowing. These symptoms may represent a serious problem that is an emergency. Do not wait to see if the symptoms will go away. Get medical help right away. Call your local emergency services (911 in the [place] Do not drive yourself to the hospital.  This information is not intended to replace advice given to you by your health care provider. Make sure you discuss any questions you have with your health care provider. Document Released: 12/06/2005 Document Revised: 05/05/2016 Document Reviewed: 09/24/2015 Elsevier Interactive Patient Education  2017 Elsevier Inc.  

## 2017-09-19 ENCOUNTER — Ambulatory Visit: Payer: Medicare Other | Admitting: Family

## 2017-09-22 ENCOUNTER — Ambulatory Visit: Payer: Medicare Other | Admitting: Family

## 2017-09-24 ENCOUNTER — Ambulatory Visit: Payer: Medicare Other

## 2017-09-24 DIAGNOSIS — J0140 Acute pansinusitis, unspecified: Secondary | ICD-10-CM | POA: Diagnosis not present

## 2017-09-28 ENCOUNTER — Ambulatory Visit: Payer: Self-pay | Admitting: Urology

## 2017-10-06 ENCOUNTER — Encounter: Payer: Self-pay | Admitting: Nurse Practitioner

## 2017-10-06 ENCOUNTER — Telehealth: Payer: Self-pay | Admitting: Family

## 2017-10-06 ENCOUNTER — Ambulatory Visit (INDEPENDENT_AMBULATORY_CARE_PROVIDER_SITE_OTHER): Payer: Medicare Other | Admitting: Nurse Practitioner

## 2017-10-06 VITALS — BP 121/83 | HR 93 | Temp 97.2°F | Ht 62.0 in | Wt 141.0 lb

## 2017-10-06 DIAGNOSIS — R300 Dysuria: Secondary | ICD-10-CM

## 2017-10-06 DIAGNOSIS — N951 Menopausal and female climacteric states: Secondary | ICD-10-CM | POA: Diagnosis not present

## 2017-10-06 LAB — URINALYSIS
Bilirubin, UA: NEGATIVE
Glucose, UA: NEGATIVE
KETONES UA: NEGATIVE
NITRITE UA: NEGATIVE
Protein, UA: NEGATIVE
RBC, UA: NEGATIVE
Specific Gravity, UA: 1.015 (ref 1.005–1.030)
Urobilinogen, Ur: 0.2 mg/dL (ref 0.2–1.0)
pH, UA: 7.5 (ref 5.0–7.5)

## 2017-10-06 NOTE — Progress Notes (Signed)
   Subjective:    Patient ID: Sandra Adams, female    DOB: 1966-12-09, 51 y.o.   MRN: [id number]  HPI Patient comes in c/o dysuria- this is a frequent thing for her but usually does not have uti. The dysuria only occurred 1 time. She is menopausal and is suppose to be on estrogen and progesterone but she does not like taking. Say sh efeels moody and has hot flashes.    Review of Systems  Constitutional: Negative.   HENT: Negative.   Respiratory: Negative.   Cardiovascular: Negative.   Genitourinary: Positive for dysuria.  Neurological: Negative.   Psychiatric/Behavioral: Negative.   All other systems reviewed and are negative.      Objective:   Physical Exam  Constitutional: She is oriented to person, place, and time. She appears well-developed and well-nourished. No distress.  Cardiovascular: Normal rate and regular rhythm.   Pulmonary/Chest: Effort normal and breath sounds normal.  Abdominal: Soft. Bowel sounds are normal.  Neurological: She is alert and oriented to person, place, and time.  Skin: Skin is warm.  Psychiatric: She has a normal mood and affect. Her behavior is normal. Judgment and thought content normal.   BP 121/83   Pulse 93   Temp (!) 97.2 F (36.2 C) (Oral)   Ht 5\' 2"  (1.575 m)   Wt 141 lb (64 kg)   LMP 10/16/2014   BMI 25.79 kg/m      Assessment & Plan:   1. Dysuria   2. Menopausal hot flushes    Patient was told that she needed to follow back up with GYN to discuss hormone pills Encouraged stress management Follow up prn  Mary-Margaret Hassell Done, FNP

## 2017-10-06 NOTE — Telephone Encounter (Signed)
Pt has appt scheduled for 4:45 this afternoon in the afterhours clinic.

## 2017-10-06 NOTE — Telephone Encounter (Signed)
NTBS.

## 2017-10-06 NOTE — Patient Instructions (Signed)
Menopause Menopause is the normal time of life when menstrual periods stop completely. Menopause is complete when you have missed 12 consecutive menstrual periods. It usually occurs between the ages of 48 years and 55 years. Very rarely does a woman develop menopause before the age of 40 years. At menopause, your ovaries stop producing the female hormones estrogen and progesterone. This can cause undesirable symptoms and also affect your health. Sometimes the symptoms may occur 4-5 years before the menopause begins. There is no relationship between menopause and:  Oral contraceptives.  Number of children you had.  Race.  The age your menstrual periods started (menarche).  Heavy smokers and very thin women may develop menopause earlier in life. What are the causes?  The ovaries stop producing the female hormones estrogen and progesterone. Other causes include:  Surgery to remove both ovaries.  The ovaries stop functioning for no known reason.  Tumors of the pituitary gland in the brain.  Medical disease that affects the ovaries and hormone production.  Radiation treatment to the abdomen or pelvis.  Chemotherapy that affects the ovaries.  What are the signs or symptoms?  Hot flashes.  Night sweats.  Decrease in sex drive.  Vaginal dryness and thinning of the vagina causing painful intercourse.  Dryness of the skin and developing wrinkles.  Headaches.  Tiredness.  Irritability.  Memory problems.  Weight gain.  Bladder infections.  Hair growth of the face and chest.  Infertility. More serious symptoms include:  Loss of bone (osteoporosis) causing breaks (fractures).  Depression.  Hardening and narrowing of the arteries (atherosclerosis) causing heart attacks and strokes.  How is this diagnosed?  When the menstrual periods have stopped for 12 straight months.  Physical exam.  Hormone studies of the blood. How is this treated? There are many treatment  choices and nearly as many questions about them. The decisions to treat or not to treat menopausal changes is an individual choice made with your health care provider. Your health care provider can discuss the treatments with you. Together, you can decide which treatment will work best for you. Your treatment choices may include:  Hormone therapy (estrogen and progesterone).  Non-hormonal medicines.  Treating the individual symptoms with medicine (for example antidepressants for depression).  Herbal medicines that may help specific symptoms.  Counseling by a psychiatrist or psychologist.  Group therapy.  Lifestyle changes including: ? Eating healthy. ? Regular exercise. ? Limiting caffeine and alcohol. ? Stress management and meditation.  No treatment.  Follow these instructions at home:  Take the medicine your health care provider gives you as directed.  Get plenty of sleep and rest.  Exercise regularly.  Eat a diet that contains calcium (good for the bones) and soy products (acts like estrogen hormone).  Avoid alcoholic beverages.  Do not smoke.  If you have hot flashes, dress in layers.  Take supplements, calcium, and vitamin D to strengthen bones.  You can use over-the-counter lubricants or moisturizers for vaginal dryness.  Group therapy is sometimes very helpful.  Acupuncture may be helpful in some cases. Contact a health care provider if:  You are not sure you are in menopause.  You are having menopausal symptoms and need advice and treatment.  You are still having menstrual periods after age 55 years.  You have pain with intercourse.  Menopause is complete (no menstrual period for 12 months) and you develop vaginal bleeding.  You need a referral to a specialist (gynecologist, psychiatrist, or psychologist) for treatment. Get help right   away if:  You have severe depression.  You have excessive vaginal bleeding.  You fell and think you have a  broken bone.  You have pain when you urinate.  You develop leg or chest pain.  You have a fast pounding heart beat (palpitations).  You have severe headaches.  You develop vision problems.  You feel a lump in your breast.  You have abdominal pain or severe indigestion. This information is not intended to replace advice given to you by your health care provider. Make sure you discuss any questions you have with your health care provider. Document Released: 02/26/2004 Document Revised: 05/13/2016 Document Reviewed: 07/05/2013 Elsevier Interactive Patient Education  2017 Elsevier Inc.  

## 2017-10-06 NOTE — Telephone Encounter (Signed)
What symptoms do you have? UTI burning  How long have you been sick? today  Have you been seen for this problem? yes  If your provider decides to give you a prescription, which pharmacy would you like for it to be sent to? [facility]   Patient informed that this information will be sent to the clinical staff for review and that they should receive a follow up call.

## 2017-10-07 LAB — URINE CULTURE

## 2017-10-12 ENCOUNTER — Other Ambulatory Visit: Payer: Self-pay | Admitting: *Deleted

## 2017-10-13 MED ORDER — ESTRADIOL 1 MG PO TABS: 1.0000 mg | ORAL_TABLET | Freq: Every day | ORAL | 3 refills | Status: DC

## 2017-10-17 ENCOUNTER — Encounter: Payer: Self-pay | Admitting: Family

## 2017-10-17 ENCOUNTER — Ambulatory Visit (INDEPENDENT_AMBULATORY_CARE_PROVIDER_SITE_OTHER): Payer: Medicare Other | Admitting: Family

## 2017-10-17 VITALS — BP 126/87 | HR 88 | Temp 98.9°F | Ht 62.0 in | Wt 140.0 lb

## 2017-10-17 DIAGNOSIS — R8290 Unspecified abnormal findings in urine: Secondary | ICD-10-CM | POA: Diagnosis not present

## 2017-10-17 DIAGNOSIS — R300 Dysuria: Secondary | ICD-10-CM

## 2017-10-17 DIAGNOSIS — M545 Low back pain, unspecified: Secondary | ICD-10-CM

## 2017-10-17 DIAGNOSIS — J301 Allergic rhinitis due to pollen: Secondary | ICD-10-CM | POA: Diagnosis not present

## 2017-10-17 DIAGNOSIS — G8929 Other chronic pain: Secondary | ICD-10-CM

## 2017-10-17 MED ORDER — TRAMADOL-ACETAMINOPHEN 37.5-325 MG PO TABS: ORAL_TABLET | ORAL | 2 refills | Status: DC

## 2017-10-17 MED ORDER — FLUTICASONE PROPIONATE 50 MCG/ACT NA SUSP: 2.0000 | Freq: Every day | NASAL | 6 refills | Status: DC

## 2017-10-17 NOTE — Patient Instructions (Signed)

## 2017-10-17 NOTE — Progress Notes (Signed)
Subjective:    Patient ID: [name], female    DOB: [birth year], 51 y.o.   MRN: [id number]  Dysuria   This is a recurrent problem. The current episode started today. The problem occurs intermittently. The problem has been waxing and waning. The quality of the pain is described as burning. The pain is at a severity of 5/10. The pain is moderate. There has been no fever. Associated symptoms include frequency ("if I drank a lot of water") and urgency. Pertinent negatives include no chills, flank pain, hematuria, hesitancy, nausea or vomiting. She has tried increased fluids for the symptoms. The treatment provided mild relief.  Sinusitis  This is a new problem. The current episode started 1 to 4 weeks ago. The problem is unchanged. There has been no fever. Her pain is at a severity of 5/10. Associated symptoms include headaches, sinus pressure and sneezing. Pertinent negatives include no chills or diaphoresis.  Back Pain  This is a chronic problem. The current episode started more than 1 year ago. The problem occurs intermittently. The problem has been waxing and waning since onset. The pain is present in the lumbar spine. The quality of the pain is described as aching. The pain is at a severity of 2/10. The pain is mild. Associated symptoms include dysuria and headaches.      Review of Systems  Constitutional: Negative for chills and diaphoresis.  HENT: Positive for sinus pressure and sneezing.   Gastrointestinal: Negative for nausea and vomiting.  Genitourinary: Positive for dysuria, frequency ("if I drank a lot of water") and urgency. Negative for flank pain, hematuria and hesitancy.  Musculoskeletal: Positive for back pain.  Neurological: Positive for headaches.  All other systems reviewed and are negative.      Objective:   Physical Exam  Constitutional: She is oriented to person, place, and time. She appears well-developed and well-nourished. No distress.  HENT:  Head:  Normocephalic and atraumatic.  Right Ear: External ear normal.  Left Ear: External ear normal.  Nose: Mucosal edema and rhinorrhea present.  Mouth/Throat: Oropharynx is clear and moist.  Eyes: Pupils are equal, round, and reactive to light.  Neck: Normal range of motion. Neck supple. No thyromegaly present.  Cardiovascular: Normal rate, regular rhythm, normal heart sounds and intact distal pulses.   No murmur heard. Pulmonary/Chest: Effort normal and breath sounds normal. No respiratory distress. She has no wheezes.  Abdominal: Soft. Bowel sounds are normal. She exhibits no distension. There is tenderness (mild lower abd ).  Musculoskeletal: Normal range of motion. She exhibits no edema or tenderness.  Neurological: She is alert and oriented to person, place, and time.  Skin: Skin is warm and dry.  Psychiatric: She has a normal mood and affect. Her behavior is normal. Judgment and thought content normal.  Vitals reviewed.     BP 126/87   Pulse 88   Temp 98.9 F (37.2 C) (Oral)   Ht 5\' 2"  (1.575 m)   Wt 140 lb (63.5 kg)   LMP 10/16/2014   BMI 25.61 kg/m      Assessment & Plan:  1. Urine abnormality - Urinalysis, Complete - Wet Prep for Trick, Yeast, Clue - Urine Culture  2. Dysuria Force fluids AZO over the counter X2 days RTO prn Culture pending - Urine Culture  3. Allergic rhinitis due to pollen, unspecified seasonality  4. Chronic bilateral low back pain without sciatica Rest Ice and heat ROM exercises  - traMADol-acetaminophen (ULTRACET) 37.5-325 MG tablet; TAKE 2 TABLETS  BY MOUTH EVERY 6 TO 8 HOURS AS NEEDED MAX 8/24 HRS  Dispense: 60 tablet; Refill: [place], FNP

## 2017-10-18 ENCOUNTER — Other Ambulatory Visit: Payer: Self-pay | Admitting: Family

## 2017-10-18 LAB — MICROSCOPIC EXAMINATION: Casts: NONE SEEN /lpf

## 2017-10-18 LAB — URINALYSIS, COMPLETE
BILIRUBIN UA: NEGATIVE
Glucose, UA: NEGATIVE
KETONES UA: NEGATIVE
Nitrite, UA: POSITIVE — AB
RBC UA: NEGATIVE
SPEC GRAV UA: 1.016 (ref 1.005–1.030)
Urobilinogen, Ur: 1 mg/dL (ref 0.2–1.0)
pH, UA: 5 (ref 5.0–7.5)

## 2017-10-18 LAB — WET PREP FOR TRICH, YEAST, CLUE
CLUE CELL EXAM: POSITIVE — AB
Trichomonas Exam: NEGATIVE
Yeast Exam: NEGATIVE

## 2017-10-18 MED ORDER — METRONIDAZOLE 500 MG PO TABS: 500.0000 mg | ORAL_TABLET | Freq: Two times a day (BID) | ORAL | 0 refills | Status: DC

## 2017-10-18 MED ORDER — CEPHALEXIN 500 MG PO CAPS: 500.0000 mg | ORAL_CAPSULE | Freq: Two times a day (BID) | ORAL | 0 refills | Status: AC

## 2017-10-19 LAB — URINE CULTURE

## 2017-10-24 DIAGNOSIS — H04123 Dry eye syndrome of bilateral lacrimal glands: Secondary | ICD-10-CM | POA: Diagnosis not present

## 2017-11-02 ENCOUNTER — Telehealth: Payer: Self-pay | Admitting: Family

## 2017-11-02 NOTE — Telephone Encounter (Signed)
Patient went to   [facility] back in 2016.  She will call to see if she can schedule or need a new referral.   Advised she would need to schedule an appointment for an antibiotic for pressure in her ears.

## 2017-11-15 ENCOUNTER — Other Ambulatory Visit: Payer: Self-pay | Admitting: Family

## 2017-11-15 DIAGNOSIS — F411 Generalized anxiety disorder: Secondary | ICD-10-CM

## 2017-11-16 NOTE — Telephone Encounter (Signed)
Please advise 

## 2017-11-16 NOTE — Telephone Encounter (Signed)
Patient needs follow up appt w/ PCP for refills on controlled substances.  Please inform.

## 2017-11-23 ENCOUNTER — Encounter: Payer: Self-pay | Admitting: Family Medicine

## 2017-11-23 ENCOUNTER — Ambulatory Visit (INDEPENDENT_AMBULATORY_CARE_PROVIDER_SITE_OTHER): Payer: Medicare Other | Admitting: Family Medicine

## 2017-11-23 VITALS — BP 127/70 | HR 89 | Temp 97.3°F | Ht 62.0 in | Wt 136.0 lb

## 2017-11-23 DIAGNOSIS — N3090 Cystitis, unspecified without hematuria: Secondary | ICD-10-CM | POA: Diagnosis not present

## 2017-11-23 DIAGNOSIS — N3289 Other specified disorders of bladder: Secondary | ICD-10-CM | POA: Diagnosis not present

## 2017-11-23 DIAGNOSIS — N3010 Interstitial cystitis (chronic) without hematuria: Secondary | ICD-10-CM

## 2017-11-23 LAB — URINALYSIS
Bilirubin, UA: NEGATIVE
Glucose, UA: NEGATIVE
KETONES UA: NEGATIVE
NITRITE UA: NEGATIVE
PH UA: 7.5 (ref 5.0–7.5)
Protein, UA: NEGATIVE
SPEC GRAV UA: 1.015 (ref 1.005–1.030)
Urobilinogen, Ur: 0.2 mg/dL (ref 0.2–1.0)

## 2017-11-23 MED ORDER — SOLIFENACIN SUCCINATE 5 MG PO TABS: 5.0000 mg | ORAL_TABLET | Freq: Every day | ORAL | 2 refills | Status: DC

## 2017-11-23 MED ORDER — SULFAMETHOXAZOLE-TRIMETHOPRIM 800-160 MG PO TABS: 1.0000 | ORAL_TABLET | Freq: Two times a day (BID) | ORAL | 0 refills | Status: DC

## 2017-11-23 NOTE — Patient Instructions (Signed)
Interstitial Cystitis Interstitial cystitis is a condition that causes inflammation of the bladder. The bladder is a hollow organ in the lower part of your abdomen. It stores urine after the urine is made by your kidneys. With interstitial cystitis, you may have pain in the bladder area. You may also have a frequent and urgent need to urinate. The severity of interstitial cystitis can vary from person to person. You may have flare-ups of the condition, and then it may go away for a while. For many people who have this condition, it becomes a long-term problem. What are the causes? The cause of this condition is not known. What increases the risk? This condition is more likely to develop in women. What are the signs or symptoms? Symptoms of interstitial cystitis vary, and they can change over time. Symptoms may include:  Discomfort or pain in the bladder area. This can range from mild to severe. The pain may change in intensity as the bladder fills with urine or as it empties.  Pelvic pain.  An urgent need to urinate.  Frequent urination.  Pain during sexual intercourse.  Pinpoint bleeding on the bladder wall.  For women, the symptoms often get worse during menstruation. How is this diagnosed? This condition is diagnosed by evaluating your symptoms and ruling out other causes. A physical exam will be done. Various tests may be done to rule out other conditions. Common tests include:  Urine tests.  Cystoscopy. In this test, a tool that is like a very thin telescope is used to look into your bladder.  Biopsy. This involves taking a sample of tissue from the bladder wall to be examined under a microscope.  How is this treated? There is no cure for interstitial cystitis, but treatment methods are available to control your symptoms. Work closely with your health care provider to find the treatments that will be most effective for you. Treatment options may include:  Medicines to relieve  pain and to help reduce the number of times that you feel the need to urinate.  Bladder training. This involves learning ways to control when you urinate, such as: ? Urinating at scheduled times. ? Training yourself to delay urination. ? Doing exercises (Kegel exercises) to strengthen the muscles that control urine flow.  Lifestyle changes, such as changing your diet or taking steps to control stress.  Use of a device that provides electrical stimulation in order to reduce pain.  A procedure that stretches your bladder by filling it with air or fluid.  Surgery. This is rare. It is only done for extreme cases if other treatments do not help.  Follow these instructions at home:  Take medicines only as directed by your health care provider.  Use bladder training techniques as directed. ? Keep a bladder diary to find out which foods, liquids, or activities make your symptoms worse. ? Use your bladder diary to schedule bathroom trips. If you are away from home, plan to be near a bathroom at each of your scheduled times. ? Make sure you urinate just before you leave the house and just before you go to bed.  Do Kegel exercises as directed by your health care provider.  Do not drink alcohol.  Do not use any tobacco products, including cigarettes, chewing tobacco, or electronic cigarettes. If you need help quitting, ask your health care provider.  Make dietary changes as directed by your health care provider. You may need to avoid spicy foods and foods that contain a high amount   of potassium.  Limit your drinking of beverages that stimulate urination. These include soda, coffee, and tea.  Keep all follow-up visits as directed by your health care provider. This is important. Contact a health care provider if:  Your symptoms do not get better after treatment.  Your pain and discomfort are getting worse.  You have more frequent urges to urinate.  You have a fever. Get help right away  if:  You are not able to control your bladder at all. This information is not intended to replace advice given to you by your health care provider. Make sure you discuss any questions you have with your health care provider. Document Released: 08/06/2004 Document Revised: 05/13/2016 Document Reviewed: 08/13/2014 Elsevier Interactive Patient Education  2018 [employer]  

## 2017-11-23 NOTE — Progress Notes (Signed)
Chief Complaint  Patient presents with  . Dysuria    pt here today c/o dysuria and hot flashes which she sees Gyn for    HPI  Patient presents today for feeling weak all over for two days. She has had dysuria as well as frequency and pain at left inguinal area. She has felt lightheaded. Appetite is poor.  PMH: Smoking status noted ROS: Per HPI  Objective: BP 127/70   Pulse 89   Temp (!) 97.3 F (36.3 C) (Oral)   Ht 5\' 2"  (1.575 m)   Wt 136 lb (61.7 kg)   LMP 10/16/2014   BMI 24.87 kg/m  Gen: NAD, alert, cooperative with exam HEENT: NCAT, EOMI, PERRL CV: RRR, good S1/S2, no murmur Resp: CTABL, no wheezes, non-labored Abd: SNTND, BS present, no guarding or organomegaly Ext: No edema, warm Neuro: Alert and oriented, No gross deficits  Assessment and plan:  1. Cystitis   2. Bladder spasm   3. Interstitial cystitis     Meds ordered this encounter  Medications  . sulfamethoxazole-trimethoprim (BACTRIM DS) 800-160 MG tablet    Sig: Take 1 tablet by mouth 2 (two) times daily.    Dispense:  14 tablet    Refill:  0  . solifenacin (VESICARE) 5 MG tablet    Sig: Take 1 tablet (5 mg total) by mouth daily. For bladder spasms.    Dispense:  30 tablet    Refill:  2    Orders Placed This Encounter  Procedures  . Urine Culture  . Urinalysis    Follow up as needed.  Claretta Fraise, MD

## 2017-11-25 LAB — URINE CULTURE: Organism ID, Bacteria: NO GROWTH

## 2017-12-01 ENCOUNTER — Ambulatory Visit: Payer: Medicare Other | Admitting: Family

## 2018-01-02 ENCOUNTER — Ambulatory Visit (INDEPENDENT_AMBULATORY_CARE_PROVIDER_SITE_OTHER): Payer: Medicare Other | Admitting: Physician Assistant

## 2018-01-02 ENCOUNTER — Encounter: Payer: Self-pay | Admitting: Physician Assistant

## 2018-01-02 VITALS — BP 121/77 | HR 98 | Temp 99.1°F | Ht 62.0 in | Wt 138.2 lb

## 2018-01-02 DIAGNOSIS — R42 Dizziness and giddiness: Secondary | ICD-10-CM | POA: Diagnosis not present

## 2018-01-02 DIAGNOSIS — N3001 Acute cystitis with hematuria: Secondary | ICD-10-CM | POA: Diagnosis not present

## 2018-01-02 DIAGNOSIS — J0110 Acute frontal sinusitis, unspecified: Secondary | ICD-10-CM

## 2018-01-02 DIAGNOSIS — R300 Dysuria: Secondary | ICD-10-CM | POA: Diagnosis not present

## 2018-01-02 LAB — MICROSCOPIC EXAMINATION: Renal Epithel, UA: NONE SEEN /hpf

## 2018-01-02 LAB — URINALYSIS, COMPLETE
BILIRUBIN UA: NEGATIVE
Glucose, UA: NEGATIVE
KETONES UA: NEGATIVE
NITRITE UA: NEGATIVE
Protein, UA: NEGATIVE
SPEC GRAV UA: 1.01 (ref 1.005–1.030)
UUROB: 0.2 mg/dL (ref 0.2–1.0)
pH, UA: 7.5 (ref 5.0–7.5)

## 2018-01-02 MED ORDER — SULFAMETHOXAZOLE-TRIMETHOPRIM 800-160 MG PO TABS: 1.0000 | ORAL_TABLET | Freq: Two times a day (BID) | ORAL | 0 refills | Status: DC

## 2018-01-02 MED ORDER — MECLIZINE HCL 25 MG PO TABS: 25.0000 mg | ORAL_TABLET | Freq: Three times a day (TID) | ORAL | 0 refills | Status: DC | PRN

## 2018-01-02 NOTE — Patient Instructions (Signed)
In a few days you may receive a survey in the mail or online from [employer] regarding your visit with us today. Please take a moment to fill this out. Your feedback is very important to our whole office. It can help us better understand your needs as well as improve your experience and satisfaction. Thank you for taking your time to complete it. We care about you.  Linken Mcglothen, PA-C  

## 2018-01-02 NOTE — Progress Notes (Signed)
BP 121/77   Pulse 98   Temp 99.1 F (37.3 C) (Oral)   Ht 5\' 2"  (1.575 m)   Wt 138 lb 3.2 oz (62.7 kg)   LMP 10/16/2014   BMI 25.28 kg/m    Subjective:    Patient ID: Sandra Adams, female    DOB: March 08, 1966, 52 y.o.   MRN: [id number]  HPI: Sandra Adams is a 52 y.o. female presenting on 01/02/2018 for Sinusitis (Facial pressure) and Dizziness  This patient has had many days of sinus headache and postnasal drainage. There is copious drainage at times. Denies any fever at this time. There has been a history of sinus infections in the past.  No history of sinus surgery. There is cough at night. It has become more prevalent in recent days.  This patient has had several days of dysuria, frequency and nocturia. There is also pain over the bladder in the suprapubic region, no back pain. Denies leakage or hematuria.  Denies fever or chills. No pain in flank area.   Relevant past medical, surgical, family and social history reviewed and updated as indicated. Allergies and medications reviewed and updated.  Past Medical History:  Diagnosis Date  . Anxiety   . Depression   . Menopausal symptom   . Migraine   . Scoliosis     Past Surgical History:  Procedure Laterality Date  . KNEE SURGERY Left 1974 and 1979    Review of Systems  Constitutional: Positive for chills and fatigue. Negative for activity change and appetite change.  HENT: Positive for congestion, facial swelling, postnasal drip and sore throat.   Eyes: Negative.   Respiratory: Positive for cough. Negative for shortness of breath and wheezing.   Cardiovascular: Negative.  Negative for chest pain, palpitations and leg swelling.  Gastrointestinal: Negative.   Genitourinary: Positive for frequency and urgency.  Musculoskeletal: Negative.   Skin: Negative.   Neurological: Positive for headaches.    Allergies as of 01/02/2018      Reactions   Ciprofloxacin    Makes her feel weak,dizzy   Imitrex [sumatriptan]    Blurred vision   Prednisone Nausea And Vomiting   Tylenol [acetaminophen]    Tylenol #3,Headaches   Vicodin [hydrocodone-acetaminophen]    GI upset      Medication List        Accurate as of 01/02/18  1:50 PM. Always use your most recent med list.          ALPRAZolam 0.25 MG tablet Commonly known as:  XANAX Take 1 Tablet by mouth at bedtime as needed FOR ANXIETY   estradiol 1 MG tablet Commonly known as:  ESTRACE Take 1 tablet (1 mg total) by mouth daily.   fluticasone 50 MCG/ACT nasal spray Commonly known as:  FLONASE Place 2 sprays into both nostrils daily.   hydrOXYzine 25 MG capsule Commonly known as:  VISTARIL Take 1 capsule (25 mg total) by mouth 3 (three) times daily as needed.   ibuprofen 800 MG tablet Commonly known as:  ADVIL,MOTRIN Take 1 tablet (800 mg total) by mouth every 8 (eight) hours as needed.   ipratropium 0.06 % nasal spray Commonly known as:  ATROVENT INSTILL 2 SPRAYS IN EACH NOSTRIL THREE TIMES A DAY NASALLY 4 DAY(S)   meclizine 25 MG tablet Commonly known as:  ANTIVERT Take 1 tablet (25 mg total) by mouth 3 (three) times daily as needed for dizziness.   medroxyPROGESTERone 10 MG tablet Commonly known as:  PROVERA Take 1 tablet (10  mg total) by mouth daily.   montelukast 10 MG tablet Commonly known as:  SINGULAIR Take 1 tablet (10 mg total) by mouth at bedtime.   pantoprazole 40 MG tablet Commonly known as:  PROTONIX Take 1 tablet (40 mg total) by mouth daily. For stomach   PRO-BIOTIC BLEND Caps Take 1 capsule by mouth daily.   solifenacin 5 MG tablet Commonly known as:  VESICARE Take 1 tablet (5 mg total) by mouth daily. For bladder spasms.   sulfamethoxazole-trimethoprim 800-160 MG tablet Commonly known as:  BACTRIM DS,SEPTRA DS Take 1 tablet by mouth 2 (two) times daily.   traMADol-acetaminophen 37.5-325 MG tablet Commonly known as:  ULTRACET TAKE 2 TABLETS BY MOUTH EVERY 6 TO 8 HOURS AS NEEDED MAX 8/24 HRS            Objective:    BP 121/77   Pulse 98   Temp 99.1 F (37.3 C) (Oral)   Ht 5\' 2"  (1.575 m)   Wt 138 lb 3.2 oz (62.7 kg)   LMP 10/16/2014   BMI 25.28 kg/m   Allergies  Allergen Reactions  . Ciprofloxacin     Makes her feel weak,dizzy  . Imitrex [Sumatriptan]     Blurred vision   . Prednisone Nausea And Vomiting  . Tylenol [Acetaminophen]     Tylenol #3,Headaches  . Vicodin [Hydrocodone-Acetaminophen]     GI upset    Physical Exam  Constitutional: She is oriented to person, place, and time. She appears well-developed and well-nourished.  HENT:  Head: Normocephalic and atraumatic.  Right Ear: Tympanic membrane and external ear normal. No middle ear effusion.  Left Ear: Tympanic membrane and external ear normal.  No middle ear effusion.  Nose: Mucosal edema and rhinorrhea present. Right sinus exhibits no maxillary sinus tenderness. Left sinus exhibits no maxillary sinus tenderness.  Mouth/Throat: Uvula is midline. Posterior oropharyngeal erythema present.  Eyes: Conjunctivae and EOM are normal. Pupils are equal, round, and reactive to light. Right eye exhibits no discharge. Left eye exhibits no discharge.  Neck: Normal range of motion.  Cardiovascular: Normal rate, regular rhythm and normal heart sounds.  Pulmonary/Chest: Effort normal and breath sounds normal. No respiratory distress. She has no wheezes.  Abdominal: Soft. There is tenderness in the suprapubic area. There is no rebound, no CVA tenderness and negative Murphy's sign.    Lymphadenopathy:    She has no cervical adenopathy.  Neurological: She is alert and oriented to person, place, and time.  Skin: Skin is warm and dry.  Psychiatric: She has a normal mood and affect.  Nursing note and vitals reviewed.   Results for orders placed or performed in visit on 11/23/17  Urine Culture  Result Value Ref Range   Urine Culture, Routine Final report    Organism ID, Bacteria No growth   Urinalysis  Result Value Ref  Range   Specific Gravity, UA 1.015 1.005 - 1.030   pH, UA 7.5 5.0 - 7.5   Color, UA Yellow Yellow   Appearance Ur Hazy (A) Clear   Leukocytes, UA 1+ (A) Negative   Protein, UA Negative Negative/Trace   Glucose, UA Negative Negative   Ketones, UA Negative Negative   RBC, UA Trace (A) Negative   Bilirubin, UA Negative Negative   Urobilinogen, Ur 0.2 0.2 - 1.0 mg/dL   Nitrite, UA Negative Negative      Assessment & Plan:   1. Dysuria - Urine Culture - Urinalysis, Complete  2. Acute non-recurrent frontal sinusitis - sulfamethoxazole-trimethoprim (BACTRIM DS,SEPTRA  DS) 800-160 MG tablet; Take 1 tablet by mouth 2 (two) times daily.  Dispense: 20 tablet; Refill: 0  3. Dizziness - meclizine (ANTIVERT) 25 MG tablet; Take 1 tablet (25 mg total) by mouth 3 (three) times daily as needed for dizziness.  Dispense: 30 tablet; Refill: 0  4. Acute cystitis with hematuria - sulfamethoxazole-trimethoprim (BACTRIM DS,SEPTRA DS) 800-160 MG tablet; Take 1 tablet by mouth 2 (two) times daily.  Dispense: 20 tablet; Refill: 0    Current Outpatient Medications:  .  ALPRAZolam (XANAX) 0.25 MG tablet, Take 1 Tablet by mouth at bedtime as needed FOR ANXIETY, Disp: 30 tablet, Rfl: 1 .  estradiol (ESTRACE) 1 MG tablet, Take 1 tablet (1 mg total) by mouth daily., Disp: 90 tablet, Rfl: 3 .  fluticasone (FLONASE) 50 MCG/ACT nasal spray, Place 2 sprays into both nostrils daily., Disp: 16 g, Rfl: 6 .  hydrOXYzine (VISTARIL) 25 MG capsule, Take 1 capsule (25 mg total) by mouth 3 (three) times daily as needed., Disp: 30 capsule, Rfl: 0 .  ibuprofen (ADVIL,MOTRIN) 800 MG tablet, Take 1 tablet (800 mg total) by mouth every 8 (eight) hours as needed., Disp: 30 tablet, Rfl: 2 .  ipratropium (ATROVENT) 0.06 % nasal spray, INSTILL 2 SPRAYS IN EACH NOSTRIL THREE TIMES A DAY NASALLY 4 DAY(S), Disp: , Rfl: 0 .  meclizine (ANTIVERT) 25 MG tablet, Take 1 tablet (25 mg total) by mouth 3 (three) times daily as needed for  dizziness., Disp: 30 tablet, Rfl: 0 .  medroxyPROGESTERone (PROVERA) 10 MG tablet, Take 1 tablet (10 mg total) by mouth daily., Disp: 10 tablet, Rfl: 3 .  montelukast (SINGULAIR) 10 MG tablet, Take 1 tablet (10 mg total) by mouth at bedtime., Disp: 90 tablet, Rfl: 1 .  pantoprazole (PROTONIX) 40 MG tablet, Take 1 tablet (40 mg total) by mouth daily. For stomach, Disp: 30 tablet, Rfl: 11 .  Probiotic Product (PRO-BIOTIC BLEND) CAPS, Take 1 capsule by mouth daily., Disp: 30 capsule, Rfl: 1 .  solifenacin (VESICARE) 5 MG tablet, Take 1 tablet (5 mg total) by mouth daily. For bladder spasms., Disp: 30 tablet, Rfl: 2 .  sulfamethoxazole-trimethoprim (BACTRIM DS,SEPTRA DS) 800-160 MG tablet, Take 1 tablet by mouth 2 (two) times daily., Disp: 20 tablet, Rfl: 0 .  traMADol-acetaminophen (ULTRACET) 37.5-325 MG tablet, TAKE 2 TABLETS BY MOUTH EVERY 6 TO 8 HOURS AS NEEDED MAX 8/24 HRS, Disp: 60 tablet, Rfl: 2 Continue all other maintenance medications as listed above.  Follow up plan: Return if symptoms worsen or fail to improve.  Educational handout given for Huey PA-C [place] [address]  [place], [place] [address] [phone]   01/02/2018, 1:50 PM

## 2018-01-03 LAB — URINE CULTURE

## 2018-01-06 ENCOUNTER — Other Ambulatory Visit: Payer: Self-pay | Admitting: Family

## 2018-01-06 DIAGNOSIS — F411 Generalized anxiety disorder: Secondary | ICD-10-CM

## 2018-01-26 ENCOUNTER — Encounter: Payer: Self-pay | Admitting: Family

## 2018-01-26 ENCOUNTER — Telehealth: Payer: Self-pay | Admitting: Family

## 2018-01-26 ENCOUNTER — Ambulatory Visit (INDEPENDENT_AMBULATORY_CARE_PROVIDER_SITE_OTHER): Payer: Medicare Other | Admitting: Family

## 2018-01-26 ENCOUNTER — Other Ambulatory Visit: Payer: Self-pay | Admitting: *Deleted

## 2018-01-26 VITALS — BP 120/78 | HR 86 | Temp 97.4°F | Ht 62.0 in | Wt 140.0 lb

## 2018-01-26 DIAGNOSIS — H9201 Otalgia, right ear: Secondary | ICD-10-CM | POA: Diagnosis not present

## 2018-01-26 DIAGNOSIS — Z87440 Personal history of urinary (tract) infections: Secondary | ICD-10-CM

## 2018-01-26 DIAGNOSIS — J301 Allergic rhinitis due to pollen: Secondary | ICD-10-CM

## 2018-01-26 DIAGNOSIS — R300 Dysuria: Secondary | ICD-10-CM

## 2018-01-26 LAB — MICROSCOPIC EXAMINATION
Epithelial Cells (non renal): >10 /hpf — AB (ref 0–10)
RENAL EPITHEL UA: NONE SEEN /HPF

## 2018-01-26 LAB — URINALYSIS, COMPLETE
BILIRUBIN UA: NEGATIVE
Glucose, UA: NEGATIVE
Ketones, UA: NEGATIVE
Nitrite, UA: NEGATIVE
PH UA: 7 (ref 5.0–7.5)
Protein, UA: NEGATIVE
Specific Gravity, UA: 1.02 (ref 1.005–1.030)
UUROB: 0.2 mg/dL (ref 0.2–1.0)

## 2018-01-26 MED ORDER — CETIRIZINE HCL 10 MG PO TABS: 10.0000 mg | ORAL_TABLET | Freq: Every day | ORAL | 11 refills | Status: DC

## 2018-01-26 MED ORDER — FLUTICASONE PROPIONATE 50 MCG/ACT NA SUSP: 2.0000 | Freq: Every day | NASAL | 6 refills | Status: DC

## 2018-01-26 MED ORDER — MONTELUKAST SODIUM 10 MG PO TABS: 10.0000 mg | ORAL_TABLET | Freq: Every day | ORAL | 1 refills | Status: DC

## 2018-01-26 NOTE — Patient Instructions (Signed)

## 2018-01-26 NOTE — Progress Notes (Signed)
RXs sent in per pt request Okayed per Sandra Adams Pt notified

## 2018-01-26 NOTE — Progress Notes (Signed)
   Subjective:    Patient ID: Sandra Adams, female    DOB: 1966/11/02, 52 y.o.   MRN: [id number]  Otalgia   There is pain in the right ear. This is a new problem. The current episode started yesterday. The problem occurs constantly. The problem has been waxing and waning. There has been no fever. Associated symptoms include coughing (throat clearing) and hearing loss. Pertinent negatives include no headaches, neck pain or sore throat. Associated symptoms comments: Dizziness . She has tried acetaminophen for the symptoms. The treatment provided mild relief.  Dysuria   This is a recurrent problem. Associated symptoms include frequency and urgency. Pertinent negatives include no hesitancy.      Review of Systems  HENT: Positive for ear pain and hearing loss. Negative for sore throat.   Respiratory: Positive for cough (throat clearing).   Genitourinary: Positive for dysuria, frequency and urgency. Negative for hesitancy.  Musculoskeletal: Negative for neck pain.  Neurological: Negative for headaches.  All other systems reviewed and are negative.      Objective:   Physical Exam  Constitutional: She is oriented to person, place, and time. She appears well-developed and well-nourished. No distress.  HENT:  Head: Normocephalic and atraumatic.  Right Ear: External ear normal.  Left Ear: External ear normal.  Nose: Mucosal edema and rhinorrhea present.  Eyes: Pupils are equal, round, and reactive to light.  Neck: Normal range of motion. Neck supple. No thyromegaly present.  Cardiovascular: Normal rate, regular rhythm, normal heart sounds and intact distal pulses.  No murmur heard. Pulmonary/Chest: Effort normal and breath sounds normal. No respiratory distress. She has no wheezes.  Abdominal: Soft. Bowel sounds are normal. She exhibits no distension. There is no tenderness.  Musculoskeletal: Normal range of motion. She exhibits no edema or tenderness.  Neurological: She is alert and  oriented to person, place, and time.  Skin: Skin is warm and dry.  Psychiatric: She has a normal mood and affect. Her behavior is normal. Judgment and thought content normal.  Vitals reviewed.     BP 120/78   Pulse 86   Temp (!) 97.4 F (36.3 C) (Oral)   Ht 5\' 2"  (1.575 m)   Wt 140 lb (63.5 kg)   LMP 10/16/2014   BMI 25.61 kg/m      Assessment & Plan:  1. Right ear pain Tylenol prn  Take zyrtec and flonase daily - fluticasone (FLONASE) 50 MCG/ACT nasal spray; Place 2 sprays into both nostrils daily.  Dispense: 16 g; Refill: 6  2. Allergic rhinitis due to pollen, unspecified seasonality - fluticasone (FLONASE) 50 MCG/ACT nasal spray; Place 2 sprays into both nostrils daily.  Dispense: 16 g; Refill: 6 - cetirizine (ZYRTEC) 10 MG tablet; Take 1 tablet (10 mg total) by mouth daily.  Dispense: 30 tablet; Refill: 11  3. Hx: UTI (urinary tract infection) Urine culture pending Force fluids - Urinalysis, Complete  4. Dysuria - Urinalysis, Complete    [name], FNP

## 2018-02-08 ENCOUNTER — Other Ambulatory Visit: Payer: Self-pay | Admitting: Physician Assistant

## 2018-02-08 ENCOUNTER — Other Ambulatory Visit: Payer: Self-pay | Admitting: Family

## 2018-02-08 DIAGNOSIS — R42 Dizziness and giddiness: Secondary | ICD-10-CM

## 2018-02-15 ENCOUNTER — Other Ambulatory Visit: Payer: Medicare Other | Admitting: Obstetrics and Gynecology

## 2018-02-16 ENCOUNTER — Other Ambulatory Visit: Payer: Medicare Other | Admitting: Obstetrics and Gynecology

## 2018-02-24 ENCOUNTER — Encounter: Payer: Self-pay | Admitting: Family

## 2018-02-24 ENCOUNTER — Ambulatory Visit (INDEPENDENT_AMBULATORY_CARE_PROVIDER_SITE_OTHER): Payer: Medicare Other | Admitting: Family

## 2018-02-24 VITALS — BP 117/78 | HR 79 | Temp 97.0°F | Ht 62.0 in | Wt 141.0 lb

## 2018-02-24 DIAGNOSIS — J301 Allergic rhinitis due to pollen: Secondary | ICD-10-CM

## 2018-02-24 DIAGNOSIS — G8929 Other chronic pain: Secondary | ICD-10-CM

## 2018-02-24 DIAGNOSIS — F331 Major depressive disorder, recurrent, moderate: Secondary | ICD-10-CM

## 2018-02-24 DIAGNOSIS — E663 Overweight: Secondary | ICD-10-CM

## 2018-02-24 DIAGNOSIS — M545 Low back pain: Secondary | ICD-10-CM

## 2018-02-24 DIAGNOSIS — N3001 Acute cystitis with hematuria: Secondary | ICD-10-CM

## 2018-02-24 DIAGNOSIS — K219 Gastro-esophageal reflux disease without esophagitis: Secondary | ICD-10-CM

## 2018-02-24 DIAGNOSIS — R350 Frequency of micturition: Secondary | ICD-10-CM

## 2018-02-24 DIAGNOSIS — F411 Generalized anxiety disorder: Secondary | ICD-10-CM

## 2018-02-24 DIAGNOSIS — R42 Dizziness and giddiness: Secondary | ICD-10-CM

## 2018-02-24 LAB — URINALYSIS, COMPLETE
Bilirubin, UA: NEGATIVE
GLUCOSE, UA: NEGATIVE
Ketones, UA: NEGATIVE
NITRITE UA: NEGATIVE
PROTEIN UA: NEGATIVE
SPEC GRAV UA: 1.025 (ref 1.005–1.030)
UUROB: 0.2 mg/dL (ref 0.2–1.0)
pH, UA: 5 (ref 5.0–7.5)

## 2018-02-24 LAB — MICROSCOPIC EXAMINATION: Renal Epithel, UA: NONE SEEN /HPF

## 2018-02-24 MED ORDER — MECLIZINE HCL 25 MG PO TABS: 25.0000 mg | ORAL_TABLET | Freq: Three times a day (TID) | ORAL | 3 refills | Status: DC | PRN

## 2018-02-24 MED ORDER — NITROFURANTOIN MONOHYD MACRO 100 MG PO CAPS: 100.0000 mg | ORAL_CAPSULE | Freq: Two times a day (BID) | ORAL | 0 refills | Status: DC

## 2018-02-24 MED ORDER — ALPRAZOLAM 0.25 MG PO TABS: 0.2500 mg | ORAL_TABLET | Freq: Two times a day (BID) | ORAL | 5 refills | Status: DC | PRN

## 2018-02-24 MED ORDER — CETIRIZINE HCL 10 MG PO TABS: 10.0000 mg | ORAL_TABLET | Freq: Every day | ORAL | 11 refills | Status: DC

## 2018-02-24 NOTE — Progress Notes (Signed)
Subjective:    Patient ID: Sandra Adams, female    DOB: 1966-03-29, 52 y.o.   MRN: [id number]  PT presents to the office today for chronic follow up and complaints of dysuria.  Dysuria   This is a recurrent problem. The current episode started more than 1 month ago. The problem occurs intermittently. The problem has been waxing and waning. The quality of the pain is described as burning. The pain is moderate. Associated symptoms include frequency, hesitancy, nausea and urgency. Pertinent negatives include no hematuria or vomiting. She has tried increased fluids for the symptoms. The treatment provided mild relief.  Anxiety  Presents for follow-up visit. Symptoms include depressed mood, excessive worry, irritability, nausea, nervous/anxious behavior and restlessness. Symptoms occur most days.    Depression         This is a chronic problem.  The current episode started more than 1 year ago.   The onset quality is gradual.   The problem occurs intermittently.  The problem has been waxing and waning since onset.  Associated symptoms include helplessness, hopelessness, irritable, restlessness and sad.  Past treatments include SSRIs - Selective serotonin reuptake inhibitors.  Past medical history includes anxiety.   Gastroesophageal Reflux  She complains of heartburn and nausea. She reports no belching. This is a chronic problem. The current episode started more than 1 year ago. The problem occurs occasionally. The problem has been waxing and waning. The symptoms are aggravated by certain foods. She has tried a PPI for the symptoms. The treatment provided moderate relief.  Back Pain  This is a chronic problem. The current episode started more than 1 year ago. The problem occurs intermittently. The problem has been waxing and waning since onset. The pain is present in the lumbar spine. The quality of the pain is described as aching. The pain is at a severity of 2/10. The pain is mild. Associated  symptoms include dysuria.      Review of Systems  Constitutional: Positive for irritability.  Gastrointestinal: Positive for heartburn and nausea. Negative for vomiting.  Genitourinary: Positive for dysuria, frequency, hesitancy and urgency. Negative for hematuria.  Musculoskeletal: Positive for back pain.  Psychiatric/Behavioral: Positive for depression. The patient is nervous/anxious.   All other systems reviewed and are negative.      Objective:   Physical Exam  Constitutional: She is oriented to person, place, and time. She appears well-developed and well-nourished. She is irritable. No distress.  HENT:  Head: Normocephalic and atraumatic.  Right Ear: External ear normal.  Left Ear: External ear normal.  Nose: Mucosal edema and rhinorrhea present.  Mouth/Throat: Oropharynx is clear and moist.  Eyes: Pupils are equal, round, and reactive to light.  Neck: Normal range of motion. Neck supple. No thyromegaly present.  Cardiovascular: Normal rate, regular rhythm, normal heart sounds and intact distal pulses.  No murmur heard. Pulmonary/Chest: Effort normal and breath sounds normal. No respiratory distress. She has no wheezes.  Abdominal: Soft. Bowel sounds are normal. She exhibits no distension. There is no tenderness.  Musculoskeletal: Normal range of motion. She exhibits no edema or tenderness.  Neurological: She is alert and oriented to person, place, and time.  Skin: Skin is warm and dry.  Psychiatric: She has a normal mood and affect. Her behavior is normal. Judgment and thought content normal.  Vitals reviewed.   BP 117/78   Pulse 79   Temp (!) 97 F (36.1 C) (Oral)   Ht _0  (1.575 m)   Wt 141  lb (64 kg)   LMP 10/16/2014   BMI 25.79 kg/m      Assessment & Plan:  1. Urine frequency - Urinalysis, Complete - CMP14+EGFR  2. GAD (generalized anxiety disorder) Stress management discussed - CMP14+EGFR - ALPRAZolam (XANAX) 0.25 MG tablet; Take 1 tablet (0.25 mg  total) by mouth 2 (two) times daily as needed for anxiety.  Dispense: 60 tablet; Refill: 5  3. Chronic bilateral low back pain without sciatica - CMP14+EGFR  4. Moderate episode of recurrent major depressive disorder (HCC) - CMP14+EGFR  5. Overweight (BMI 25.0-29.9) - CMP14+EGFR  6. Gastroesophageal reflux disease without esophagitis - CMP14+EGFR  7. Acute cystitis with hematuria Force fluids AZO over the counter X2 days RTO prn Culture pending - CMP14+EGFR - Urine Culture - nitrofurantoin, macrocrystal-monohydrate, (MACROBID) 100 MG capsule; Take 1 capsule (100 mg total) by mouth 2 (two) times daily.  Dispense: 10 capsule; Refill: 0  8. Allergic rhinitis due to pollen, unspecified seasonality - cetirizine (ZYRTEC) 10 MG tablet; Take 1 tablet (10 mg total) by mouth daily.  Dispense: 30 tablet; Refill: 11 - meclizine (ANTIVERT) 25 MG tablet; Take 1 tablet (25 mg total) by mouth 3 (three) times daily as needed for dizziness.  Dispense: 60 tablet; Refill: 3  9. Dizziness - meclizine (ANTIVERT) 25 MG tablet; Take 1 tablet (25 mg total) by mouth 3 (three) times daily as needed for dizziness.  Dispense: 60 tablet; Refill: [place], FNP

## 2018-02-24 NOTE — Patient Instructions (Signed)
Menopause Menopause is the normal time of life when menstrual periods stop completely. Menopause is complete when you have missed 12 consecutive menstrual periods. It usually occurs between the ages of 48 years and 55 years. Very rarely does a woman develop menopause before the age of 40 years. At menopause, your ovaries stop producing the female hormones estrogen and progesterone. This can cause undesirable symptoms and also affect your health. Sometimes the symptoms may occur 4-5 years before the menopause begins. There is no relationship between menopause and:  Oral contraceptives.  Number of children you had.  Race.  The age your menstrual periods started (menarche).  Heavy smokers and very thin women may develop menopause earlier in life. What are the causes?  The ovaries stop producing the female hormones estrogen and progesterone. Other causes include:  Surgery to remove both ovaries.  The ovaries stop functioning for no known reason.  Tumors of the pituitary gland in the brain.  Medical disease that affects the ovaries and hormone production.  Radiation treatment to the abdomen or pelvis.  Chemotherapy that affects the ovaries.  What are the signs or symptoms?  Hot flashes.  Night sweats.  Decrease in sex drive.  Vaginal dryness and thinning of the vagina causing painful intercourse.  Dryness of the skin and developing wrinkles.  Headaches.  Tiredness.  Irritability.  Memory problems.  Weight gain.  Bladder infections.  Hair growth of the face and chest.  Infertility. More serious symptoms include:  Loss of bone (osteoporosis) causing breaks (fractures).  Depression.  Hardening and narrowing of the arteries (atherosclerosis) causing heart attacks and strokes.  How is this diagnosed?  When the menstrual periods have stopped for 12 straight months.  Physical exam.  Hormone studies of the blood. How is this treated? There are many treatment  choices and nearly as many questions about them. The decisions to treat or not to treat menopausal changes is an individual choice made with your health care provider. Your health care provider can discuss the treatments with you. Together, you can decide which treatment will work best for you. Your treatment choices may include:  Hormone therapy (estrogen and progesterone).  Non-hormonal medicines.  Treating the individual symptoms with medicine (for example antidepressants for depression).  Herbal medicines that may help specific symptoms.  Counseling by a psychiatrist or psychologist.  Group therapy.  Lifestyle changes including: ? Eating healthy. ? Regular exercise. ? Limiting caffeine and alcohol. ? Stress management and meditation.  No treatment.  Follow these instructions at home:  Take the medicine your health care provider gives you as directed.  Get plenty of sleep and rest.  Exercise regularly.  Eat a diet that contains calcium (good for the bones) and soy products (acts like estrogen hormone).  Avoid alcoholic beverages.  Do not smoke.  If you have hot flashes, dress in layers.  Take supplements, calcium, and vitamin D to strengthen bones.  You can use over-the-counter lubricants or moisturizers for vaginal dryness.  Group therapy is sometimes very helpful.  Acupuncture may be helpful in some cases. Contact a health care provider if:  You are not sure you are in menopause.  You are having menopausal symptoms and need advice and treatment.  You are still having menstrual periods after age 55 years.  You have pain with intercourse.  Menopause is complete (no menstrual period for 12 months) and you develop vaginal bleeding.  You need a referral to a specialist (gynecologist, psychiatrist, or psychologist) for treatment. Get help right   away if:  You have severe depression.  You have excessive vaginal bleeding.  You fell and think you have a  broken bone.  You have pain when you urinate.  You develop leg or chest pain.  You have a fast pounding heart beat (palpitations).  You have severe headaches.  You develop vision problems.  You feel a lump in your breast.  You have abdominal pain or severe indigestion. This information is not intended to replace advice given to you by your health care provider. Make sure you discuss any questions you have with your health care provider. Document Released: 02/26/2004 Document Revised: 05/13/2016 Document Reviewed: 07/05/2013 Elsevier Interactive Patient Education  2017 Elsevier Inc.  

## 2018-02-25 LAB — CMP14+EGFR
ALT: 35 IU/L — AB (ref 0–32)
AST: 22 IU/L (ref 0–40)
Albumin/Globulin Ratio: 1.6 (ref 1.2–2.2)
Albumin: 4.7 g/dL (ref 3.5–5.5)
Alkaline Phosphatase: 118 IU/L — ABNORMAL HIGH (ref 39–117)
BILIRUBIN TOTAL: 0.4 mg/dL (ref 0.0–1.2)
BUN/Creatinine Ratio: 12 (ref 9–23)
BUN: 10 mg/dL (ref 6–24)
CALCIUM: 10.1 mg/dL (ref 8.7–10.2)
CHLORIDE: 102 mmol/L (ref 96–106)
CO2: 26 mmol/L (ref 20–29)
Creatinine, Ser: 0.82 mg/dL (ref 0.57–1.00)
GFR calc Af Amer: 94 mL/min/{1.73_m2} (ref >59)
GFR, EST NON AFRICAN AMERICAN: 82 mL/min/{1.73_m2} (ref >59)
GLUCOSE: 93 mg/dL (ref 65–99)
Globulin, Total: 3 g/dL (ref 1.5–4.5)
Potassium: 4.4 mmol/L (ref 3.5–5.2)
Sodium: 144 mmol/L (ref 134–144)
Total Protein: 7.7 g/dL (ref 6.0–8.5)

## 2018-02-26 LAB — URINE CULTURE

## 2018-02-27 ENCOUNTER — Other Ambulatory Visit: Payer: Self-pay | Admitting: Family

## 2018-02-27 MED ORDER — AMOXICILLIN-POT CLAVULANATE 875-125 MG PO TABS: 1.0000 | ORAL_TABLET | Freq: Two times a day (BID) | ORAL | 0 refills | Status: DC

## 2018-03-11 ENCOUNTER — Ambulatory Visit (INDEPENDENT_AMBULATORY_CARE_PROVIDER_SITE_OTHER): Payer: 59 | Admitting: Family

## 2018-03-11 ENCOUNTER — Encounter: Payer: Self-pay | Admitting: Family

## 2018-03-11 VITALS — BP 117/79 | HR 80 | Temp 98.4°F | Ht 62.0 in | Wt 138.0 lb

## 2018-03-11 DIAGNOSIS — B9789 Other viral agents as the cause of diseases classified elsewhere: Secondary | ICD-10-CM

## 2018-03-11 DIAGNOSIS — J069 Acute upper respiratory infection, unspecified: Secondary | ICD-10-CM | POA: Diagnosis not present

## 2018-03-11 DIAGNOSIS — J301 Allergic rhinitis due to pollen: Secondary | ICD-10-CM

## 2018-03-11 MED ORDER — HYDROCODONE-HOMATROPINE 5-1.5 MG/5ML PO SYRP
5.0000 mL | ORAL_SOLUTION | Freq: Three times a day (TID) | ORAL | 0 refills | Status: DC | PRN
Start: 2018-03-11 — End: 2018-04-22

## 2018-03-11 NOTE — Progress Notes (Signed)
   Subjective:    Patient ID: Sandra Adams, female    DOB: July 07, 1966, 52 y.o.   MRN: [id number]  Sinusitis  This is a recurrent problem. The current episode started in the past 7 days. The problem has been gradually worsening since onset. There has been no fever. Her pain is at a severity of 2/10. The pain is mild. Associated symptoms include congestion, coughing, sinus pressure and sneezing. Pertinent negatives include no chills, ear pain, headaches or sore throat. Past treatments include oral decongestants and lying down. The treatment provided mild relief.      Review of Systems  Constitutional: Negative for chills.  HENT: Positive for congestion, sinus pressure and sneezing. Negative for ear pain and sore throat.   Respiratory: Positive for cough.   Neurological: Negative for headaches.  All other systems reviewed and are negative.      Objective:   Physical Exam  Constitutional: She is oriented to person, place, and time. She appears well-developed and well-nourished. No distress.  HENT:  Head: Normocephalic and atraumatic.  Right Ear: External ear normal.  Left Ear: External ear normal.  Nose: Mucosal edema and rhinorrhea present.  Mouth/Throat: Oropharynx is clear and moist.  Eyes: Pupils are equal, round, and reactive to light.  Neck: Normal range of motion. Neck supple. No thyromegaly present.  Cardiovascular: Normal rate, regular rhythm, normal heart sounds and intact distal pulses.  No murmur heard. Pulmonary/Chest: Effort normal and breath sounds normal. No respiratory distress. She has no wheezes.  Abdominal: Soft. Bowel sounds are normal. She exhibits no distension. There is no tenderness.  Musculoskeletal: Normal range of motion. She exhibits no edema or tenderness.  Neurological: She is alert and oriented to person, place, and time. She has normal reflexes. No cranial nerve deficit.  Skin: Skin is warm and dry.  Psychiatric: She has a normal mood and affect.  Her behavior is normal. Judgment and thought content normal.  Vitals reviewed.     BP 117/79   Pulse 80   Temp 98.4 F (36.9 C) (Oral)   Ht 5\' 2"  (1.575 m)   Wt 138 lb (62.6 kg)   LMP 10/16/2014   BMI 25.24 kg/m      Assessment & Plan:  1. Allergic rhinitis due to pollen, unspecified seasonality  2. Viral URI with cough - Take meds as prescribed - Use a cool mist humidifier  -Use saline nose sprays frequently -Force fluids -For any cough or congestion  Use plain Mucinex- regular strength or max strength is fine -For fever or aces or pains- take tylenol or ibuprofen. -Throat lozenges if help - HYDROcodone-homatropine (HYCODAN) 5-1.5 MG/5ML syrup; Take 5 mLs by mouth every 8 (eight) hours as needed for cough.  Dispense: 120 mL; Refill: 0   [name], FNP

## 2018-03-11 NOTE — Patient Instructions (Signed)
Upper Respiratory Infection, Adult Most upper respiratory infections (URIs) are caused by a virus. A URI affects the nose, throat, and upper air passages. The most common type of URI is often called "the common cold." Follow these instructions at home:  Take medicines only as told by your doctor.  Gargle warm saltwater or take cough drops to comfort your throat as told by your doctor.  Use a warm mist humidifier or inhale steam from a shower to increase air moisture. This may make it easier to breathe.  Drink enough fluid to keep your pee (urine) clear or pale yellow.  Eat soups and other clear broths.  Have a healthy diet.  Rest as needed.  Go back to work when your fever is gone or your doctor says it is okay. ? You may need to stay home longer to avoid giving your URI to others. ? You can also wear a face mask and wash your hands often to prevent spread of the virus.  Use your inhaler more if you have asthma.  Do not use any tobacco products, including cigarettes, chewing tobacco, or electronic cigarettes. If you need help quitting, ask your doctor. Contact a doctor if:  You are getting worse, not better.  Your symptoms are not helped by medicine.  You have chills.  You are getting more short of breath.  You have brown or red mucus.  You have yellow or brown discharge from your nose.  You have pain in your face, especially when you bend forward.  You have a fever.  You have puffy (swollen) neck glands.  You have pain while swallowing.  You have white areas in the back of your throat. Get help right away if:  You have very bad or constant: ? Headache. ? Ear pain. ? Pain in your forehead, behind your eyes, and over your cheekbones (sinus pain). ? Chest pain.  You have long-lasting (chronic) lung disease and any of the following: ? Wheezing. ? Long-lasting cough. ? Coughing up blood. ? A change in your usual mucus.  You have a stiff neck.  You have  changes in your: ? Vision. ? Hearing. ? Thinking. ? Mood. This information is not intended to replace advice given to you by your health care provider. Make sure you discuss any questions you have with your health care provider. Document Released: 05/24/2008 Document Revised: 08/08/2016 Document Reviewed: 03/13/2014 Elsevier Interactive Patient Education  2018 [employer] Inc[employer]  

## 2018-03-12 ENCOUNTER — Other Ambulatory Visit: Payer: Self-pay | Admitting: Family

## 2018-03-15 ENCOUNTER — Other Ambulatory Visit: Payer: Medicare Other | Admitting: Obstetrics and Gynecology

## 2018-03-27 ENCOUNTER — Other Ambulatory Visit: Payer: 59 | Admitting: Obstetrics and Gynecology

## 2018-04-05 ENCOUNTER — Telehealth: Payer: Self-pay | Admitting: *Deleted

## 2018-04-05 ENCOUNTER — Other Ambulatory Visit: Payer: 59 | Admitting: Obstetrics and Gynecology

## 2018-04-05 NOTE — Telephone Encounter (Signed)
Pt called stating that she would like to be prescribed something different for her hot flashes. She states she is not happy with what she is currently on. She states that she saw on "house calls" that she could talk to her doctor about getting an IUD to help with hot flashes. Informed pt that she missed her appt this morning with Dr Glo Herring. Advised that she should make another appt for a physical exam and she could talk with him about her options at that appt. Pt verbalized understanding.

## 2018-04-22 ENCOUNTER — Encounter: Payer: Self-pay | Admitting: Family

## 2018-04-22 ENCOUNTER — Ambulatory Visit (INDEPENDENT_AMBULATORY_CARE_PROVIDER_SITE_OTHER): Payer: 59 | Admitting: Family

## 2018-04-22 VITALS — BP 112/72 | HR 76 | Temp 97.8°F | Ht 62.0 in | Wt 138.2 lb

## 2018-04-22 DIAGNOSIS — R1030 Lower abdominal pain, unspecified: Secondary | ICD-10-CM

## 2018-04-22 DIAGNOSIS — N3000 Acute cystitis without hematuria: Secondary | ICD-10-CM | POA: Diagnosis not present

## 2018-04-22 MED ORDER — CEPHALEXIN 500 MG PO CAPS: 500.0000 mg | ORAL_CAPSULE | Freq: Two times a day (BID) | ORAL | 0 refills | Status: DC

## 2018-04-22 NOTE — Patient Instructions (Signed)

## 2018-04-22 NOTE — Progress Notes (Signed)
   Subjective:    Patient ID: Sandra Adams, female    DOB: 1966/11/01, 52 y.o.   MRN: [id number]  Chief Complaint  Patient presents with  . Abdominal Pain    Abdominal Pain  This is a new problem. Associated symptoms include frequency and nausea. Pertinent negatives include no hematuria or vomiting.  Urinary Tract Infection   This is a recurrent problem. The current episode started in the past 7 days. The problem occurs every urination. The problem has been gradually worsening. The quality of the pain is described as burning. The pain is at a severity of 3/10. There has been no fever. Associated symptoms include frequency, nausea and urgency. Pertinent negatives include no discharge, flank pain, hematuria, hesitancy or vomiting. She has tried increased fluids for the symptoms. The treatment provided no relief. Her past medical history is significant for recurrent UTIs.      Review of Systems  Gastrointestinal: Positive for abdominal pain and nausea. Negative for vomiting.  Genitourinary: Positive for frequency and urgency. Negative for flank pain, hematuria and hesitancy.  All other systems reviewed and are negative.      Objective:   Physical Exam  Constitutional: She is oriented to person, place, and time. She appears well-developed and well-nourished. No distress.  HENT:  Right Ear: External ear normal.  Neck: Normal range of motion. Neck supple. No thyromegaly present.  Cardiovascular: Normal rate, regular rhythm, normal heart sounds and intact distal pulses.  No murmur heard. Pulmonary/Chest: Effort normal and breath sounds normal. No respiratory distress. She has no wheezes.  Abdominal: Soft. Bowel sounds are normal. She exhibits no distension. There is tenderness (lower abdominal).  Musculoskeletal: Normal range of motion. She exhibits no edema or tenderness.  Neurological: She is alert and oriented to person, place, and time. She has normal reflexes. No cranial nerve  deficit.  Skin: Skin is warm and dry.  Psychiatric: She has a normal mood and affect. Her behavior is normal. Judgment and thought content normal.  Vitals reviewed.     BP 112/72   Pulse 76   Temp 97.8 F (36.6 C) (Oral)   Ht 5\' 2"  (1.575 m)   Wt 138 lb 3.2 oz (62.7 kg)   LMP 10/16/2014   BMI 25.28 kg/m      Assessment & Plan:  Sandra Adams was seen today for abdominal pain.  Diagnoses and all orders for this visit:  Lower abdominal pain -     Urinalysis  Acute cystitis without hematuria -     Urine Culture -     cephALEXin (KEFLEX) 500 MG capsule; Take 1 capsule (500 mg total) by mouth 2 (two) times daily.   Force fluids AZO over the counter X2 days RTO prn Culture pending    [name], FNP

## 2018-04-24 LAB — URINALYSIS
Bilirubin, UA: NEGATIVE
GLUCOSE, UA: NEGATIVE
KETONES UA: NEGATIVE
Nitrite, UA: NEGATIVE
PROTEIN UA: NEGATIVE
RBC, UA: NEGATIVE
SPEC GRAV UA: 1.02 (ref 1.005–1.030)
Urobilinogen, Ur: 0.2 mg/dL (ref 0.2–1.0)
pH, UA: 6.5 (ref 5.0–7.5)

## 2018-04-24 LAB — URINE CULTURE

## 2018-05-01 ENCOUNTER — Other Ambulatory Visit: Payer: 59 | Admitting: Obstetrics and Gynecology

## 2018-05-01 ENCOUNTER — Ambulatory Visit (INDEPENDENT_AMBULATORY_CARE_PROVIDER_SITE_OTHER): Payer: 59 | Admitting: Family Medicine

## 2018-05-01 ENCOUNTER — Encounter: Payer: Self-pay | Admitting: Family Medicine

## 2018-05-01 VITALS — BP 124/77 | HR 89 | Temp 100.0°F | Ht 62.0 in | Wt 138.0 lb

## 2018-05-01 DIAGNOSIS — J020 Streptococcal pharyngitis: Secondary | ICD-10-CM

## 2018-05-01 LAB — RAPID STREP SCREEN (MED CTR MEBANE ONLY): Strep Gp A Ag, IA W/Reflex: POSITIVE — AB

## 2018-05-01 MED ORDER — FLUTICASONE PROPIONATE 50 MCG/ACT NA SUSP: 2.0000 | Freq: Every day | NASAL | 6 refills | Status: DC

## 2018-05-01 MED ORDER — AMOXICILLIN 500 MG PO CAPS: 500.0000 mg | ORAL_CAPSULE | Freq: Two times a day (BID) | ORAL | 0 refills | Status: DC

## 2018-05-01 NOTE — Progress Notes (Signed)
BP 124/77   Pulse 89   Temp 100 F (37.8 C) (Oral)   Ht 5\' 2"  (1.575 m)   Wt 138 lb (62.6 kg)   LMP 10/16/2014   BMI 25.24 kg/m    Subjective:    Patient ID: [name], female    DOB: 03-28-1966, 52 y.o.   MRN: [id number]  HPI: Sandra Adams is a 52 y.o. female presenting on 05/01/2018 for Sore throat, nasal congestion, ears stopped up   HPI Sore throat nasal congestion Patient has been having sore throat nasal congestion and pressure in the ears that has been going on for the past 4 or 5 days.  She is felt very feverish and had chills.  She has a temperature of 100 here today.  She has been using Ricola and ibuprofen which have been helping.  She says she rarely has a cough but really is not having much coughing but just more the irritation in her throat and pressure in her sinuses and in her ears.  She denies any shortness of breath or wheezing.  Relevant past medical, surgical, family and social history reviewed and updated as indicated. Interim medical history since our last visit reviewed. Allergies and medications reviewed and updated.  Review of Systems  Constitutional: Positive for chills and fever.  HENT: Positive for congestion, ear pain, postnasal drip, rhinorrhea, sinus pressure, sneezing and sore throat. Negative for ear discharge.   Eyes: Negative for pain, redness and visual disturbance.  Respiratory: Negative for cough, chest tightness and shortness of breath.   Cardiovascular: Negative for chest pain and leg swelling.  Musculoskeletal: Negative for back pain and gait problem.  Skin: Negative for rash.  Neurological: Negative for light-headedness and headaches.  Psychiatric/Behavioral: Negative for agitation and behavioral problems.  All other systems reviewed and are negative.   Per HPI unless specifically indicated above   Allergies as of 05/01/2018      Reactions   Ciprofloxacin    Makes her feel weak,dizzy   Imitrex [sumatriptan]    Blurred  vision   Prednisone Nausea And Vomiting   Tylenol [acetaminophen]    Tylenol #3,Headaches   Vicodin [hydrocodone-acetaminophen]    GI upset      Medication List        Accurate as of 05/01/18 11:47 AM. Always use your most recent med list.          ALPRAZolam 0.25 MG tablet Commonly known as:  XANAX Take 1 tablet (0.25 mg total) by mouth 2 (two) times daily as needed for anxiety.   cephALEXin 500 MG capsule Commonly known as:  KEFLEX Take 1 capsule (500 mg total) by mouth 2 (two) times daily.   cetirizine 10 MG tablet Commonly known as:  ZYRTEC Take 1 tablet (10 mg total) by mouth daily.   fluticasone 50 MCG/ACT nasal spray Commonly known as:  FLONASE Place 2 sprays into both nostrils daily.   IBU 600 MG tablet Generic drug:  ibuprofen TAKE 1 TABLET BY MOUTH EVERY 8 HOURS AS NEEDED FOR MILD OR MODERATE PAIN   meclizine 25 MG tablet Commonly known as:  ANTIVERT Take 1 tablet (25 mg total) by mouth 3 (three) times daily as needed for dizziness.   pantoprazole 40 MG tablet Commonly known as:  PROTONIX Take 1 tablet (40 mg total) by mouth daily. For stomach   PRO-BIOTIC BLEND Caps Take 1 capsule by mouth daily.   solifenacin 5 MG tablet Commonly known as:  VESICARE Take 1 tablet (5  mg total) by mouth daily. For bladder spasms.   traMADol-acetaminophen 37.5-325 MG tablet Commonly known as:  ULTRACET TAKE 2 TABLETS BY MOUTH EVERY 6 TO 8 HOURS AS NEEDED MAX 8/24 HRS          Objective:    BP 124/77   Pulse 89   Temp 100 F (37.8 C) (Oral)   Ht 5\' 2"  (1.575 m)   Wt 138 lb (62.6 kg)   LMP 10/16/2014   BMI 25.24 kg/m   Wt Readings from Last 3 Encounters:  05/01/18 138 lb (62.6 kg)  04/22/18 138 lb 3.2 oz (62.7 kg)  03/11/18 138 lb (62.6 kg)    Physical Exam  Constitutional: She is oriented to person, place, and time. She appears well-developed and well-nourished. No distress.  HENT:  Right Ear: Tympanic membrane, external ear and ear canal normal.    Left Ear: Tympanic membrane, external ear and ear canal normal.  Nose: Mucosal edema and rhinorrhea present. No epistaxis. Right sinus exhibits no maxillary sinus tenderness and no frontal sinus tenderness. Left sinus exhibits no maxillary sinus tenderness and no frontal sinus tenderness.  Mouth/Throat: Uvula is midline and mucous membranes are normal. Posterior oropharyngeal edema and posterior oropharyngeal erythema present. No oropharyngeal exudate or tonsillar abscesses.  Eyes: Conjunctivae are normal.  Cardiovascular: Normal rate, regular rhythm, normal heart sounds and intact distal pulses.  No murmur heard. Pulmonary/Chest: Effort normal and breath sounds normal. No stridor. No respiratory distress. She has no wheezes.  Musculoskeletal: Normal range of motion. She exhibits no edema or tenderness.  Neurological: She is alert and oriented to person, place, and time. Coordination normal.  Skin: Skin is warm and dry. No rash noted. She is not diaphoretic.  Psychiatric: She has a normal mood and affect. Her behavior is normal.  Vitals reviewed.       Assessment & Plan:   Problem List Items Addressed This Visit    None    Visit Diagnoses    Strep pharyngitis    -  Primary   Relevant Medications   amoxicillin (AMOXIL) 500 MG capsule   fluticasone (FLONASE) 50 MCG/ACT nasal spray   Other Relevant Orders   Rapid Strep Screen ([facility] & [facility] ONLY)       Follow up plan: Return if symptoms worsen or fail to improve.  Counseling provided for all of the vaccine components Orders Placed This Encounter  Procedures  . Rapid Strep Screen ([facility] & [facility] ONLY)    Caryl Pina, MD [facility] 05/01/2018, 11:47 AM

## 2018-05-29 ENCOUNTER — Other Ambulatory Visit: Payer: 59 | Admitting: Obstetrics and Gynecology

## 2018-05-29 ENCOUNTER — Telehealth: Payer: Self-pay | Admitting: *Deleted

## 2018-05-29 NOTE — Telephone Encounter (Signed)
Attempted to return pts call. Voicemail full

## 2018-06-01 NOTE — Telephone Encounter (Signed)
Pt called requesting a medication to be sent in to help her sleep. Advised pt to try an OTC sleep aid first such as melatonin or unisom. Advised to call back if those medications do not help. Pt verbalized understanding.

## 2018-06-07 ENCOUNTER — Encounter: Payer: Self-pay | Admitting: Nurse Practitioner

## 2018-06-07 ENCOUNTER — Ambulatory Visit (INDEPENDENT_AMBULATORY_CARE_PROVIDER_SITE_OTHER): Payer: 59 | Admitting: Nurse Practitioner

## 2018-06-07 VITALS — BP 123/83 | HR 85 | Temp 98.1°F | Ht 62.0 in | Wt 139.6 lb

## 2018-06-07 DIAGNOSIS — Z09 Encounter for follow-up examination after completed treatment for conditions other than malignant neoplasm: Secondary | ICD-10-CM | POA: Diagnosis not present

## 2018-06-07 DIAGNOSIS — R911 Solitary pulmonary nodule: Secondary | ICD-10-CM | POA: Diagnosis not present

## 2018-06-07 DIAGNOSIS — N200 Calculus of kidney: Secondary | ICD-10-CM | POA: Diagnosis not present

## 2018-06-07 LAB — MICROSCOPIC EXAMINATION

## 2018-06-07 LAB — URINALYSIS, COMPLETE
BILIRUBIN UA: NEGATIVE
GLUCOSE, UA: NEGATIVE
KETONES UA: NEGATIVE
Nitrite, UA: NEGATIVE
PH UA: 5 (ref 5.0–7.5)
UUROB: 0.2 mg/dL (ref 0.2–1.0)

## 2018-06-07 NOTE — Progress Notes (Signed)
   Subjective:    Patient ID: Sandra Adams, female    DOB: 1966-10-03, 52 y.o.   MRN: [id number]   Chief Complaint: ER follow up [facility]- UTI & Kidney stones) and Referral ([facility])   HPI Patient went to [facility] ER on 05/31/18 with abdominal pain. She was diagnosed with UTI and left ureteral calculi. She was treated with keflex bid. Since being on ER she has [assed a small kidney stone that she brought with her. She has not seen urology. Ws referred to Dr. Margret Chance and his office does not accept her medicaid.  * incidental finding on KUB was 29mm left lower lung nodule- never smoker. Grandmother died of lung caner, but was a smoker.  Review of Systems  Constitutional: Negative.   HENT: Negative.   Respiratory: Negative.   Cardiovascular: Negative.   Gastrointestinal: Negative.   Genitourinary: Negative.   Neurological: Negative.   Psychiatric/Behavioral: Negative.   All other systems reviewed and are negative.  BP 123/83   Pulse 85   Temp 98.1 F (36.7 C) (Oral)   Ht 5\' 2"  (1.575 m)   Wt 139 lb 9.6 oz (63.3 kg)   LMP 10/16/2014   BMI 25.53 kg/m      Objective:   Physical Exam  Constitutional: She is oriented to person, place, and time. She appears well-developed and well-nourished. No distress.  Cardiovascular: Normal rate and regular rhythm.  Pulmonary/Chest: Effort normal and breath sounds normal.  Abdominal: Soft. Bowel sounds are normal.  Genitourinary:  Genitourinary Comments: No cva tenderness  Neurological: She is alert and oriented to person, place, and time. No cranial nerve deficit.  Skin: Skin is warm and dry.  Psychiatric: She has a normal mood and affect. Her behavior is normal. Judgment and thought content normal.   BP 123/83   Pulse 85   Temp 98.1 F (36.7 C) (Oral)   Ht 5\' 2"  (1.575 m)   Wt 139 lb 9.6 oz (63.3 kg)   LMP 10/16/2014   BMI 25.53 kg/m         Assessment & Plan:  Sandra Adams in today with chief  complaint of ER follow up [facility]- UTI & Kidney stones) and Referral ([facility])   1. Kidney stone - Urinalysis, Complete - Stone analysis  2. Solitary pulmonary nodule - CT Chest Wo Contrast; Future  3. Hospital discharge follow-up Hospital records reviewed  [place], [place]

## 2018-06-07 NOTE — Patient Instructions (Signed)
Dietary Guidelines to Help Prevent Kidney Stones Kidney stones are deposits of minerals and salts that form inside your kidneys. Your risk of developing kidney stones may be greater depending on your diet, your lifestyle, the medicines you take, and whether you have certain medical conditions. Most people can reduce their chances of developing kidney stones by following the instructions below. Depending on your overall health and the type of kidney stones you tend to develop, your dietitian may give you more specific instructions. What are tips for following this plan? Reading food labels  Choose foods with "no salt added" or "low-salt" labels. Limit your sodium intake to less than 1500 mg per day.  Choose foods with calcium for each meal and snack. Try to eat about 300 mg of calcium at each meal. Foods that contain 200-500 mg of calcium per serving include: ? 8 oz (237 ml) of milk, fortified nondairy milk, and fortified fruit juice. ? 8 oz (237 ml) of kefir, yogurt, and soy yogurt. ? 4 oz (118 ml) of tofu. ? 1 oz of cheese. ? 1 cup (300 g) of dried figs. ? 1 cup (91 g) of cooked broccoli. ? 1-3 oz can of sardines or mackerel.  Most people need 1000 to 1500 mg of calcium each day. Talk to your dietitian about how much calcium is recommended for you. Shopping  Buy plenty of fresh fruits and vegetables. Most people do not need to avoid fruits and vegetables, even if they contain nutrients that may contribute to kidney stones.  When shopping for convenience foods, choose: ? Whole pieces of fruit. ? Premade salads with dressing on the side. ? Low-fat fruit and yogurt smoothies.  Avoid buying frozen meals or prepared deli foods.  Look for foods with live cultures, such as yogurt and kefir. Cooking  Do not add salt to food when cooking. Place a salt shaker on the table and allow each person to add his or her own salt to taste.  Use vegetable protein, such as beans, textured vegetable  protein (TVP), or tofu instead of meat in pasta, casseroles, and soups. Meal planning  Eat less salt, if told by your dietitian. To do this: ? Avoid eating processed or premade food. ? Avoid eating fast food.  Eat less animal protein, including cheese, meat, poultry, or fish, if told by your dietitian. To do this: ? Limit the number of times you have meat, poultry, fish, or cheese each week. Eat a diet free of meat at least 2 days a week. ? Eat only one serving each day of meat, poultry, fish, or seafood. ? When you prepare animal protein, cut pieces into small portion sizes. For most meat and fish, one serving is about the size of one deck of cards.  Eat at least 5 servings of fresh fruits and vegetables each day. To do this: ? Keep fruits and vegetables on hand for snacks. ? Eat 1 piece of fruit or a handful of berries with breakfast. ? Have a salad and fruit at lunch. ? Have two kinds of vegetables at dinner.  Limit foods that are high in a substance called oxalate. These include: ? Spinach. ? Rhubarb. ? Beets. ? Potato chips and french fries. ? Nuts.  If you regularly take a diuretic medicine, make sure to eat at least 1-2 fruits or vegetables high in potassium each day. These include: ? Avocado. ? Banana. ? Orange, prune, carrot, or tomato juice. ? Baked potato. ? Cabbage. ? Beans and split peas.   General instructions  Drink enough fluid to keep your urine clear or pale yellow. This is the most important thing you can do.  Talk to your health care provider and dietitian about taking daily supplements. Depending on your health and the cause of your kidney stones, you may be advised: ? Not to take supplements with vitamin C. ? To take a calcium supplement. ? To take a daily probiotic supplement. ? To take other supplements such as magnesium, fish oil, or vitamin B6.  Take all medicines and supplements as told by your health care provider.  Limit alcohol intake to no more  than 1 drink a day for nonpregnant women and 2 drinks a day for men. One drink equals 12 oz of beer, 5 oz of wine, or 1 oz of hard liquor.  Lose weight if told by your health care provider. Work with your dietitian to find strategies and an eating plan that works best for you. What foods are not recommended? Limit your intake of the following foods, or as told by your dietitian. Talk to your dietitian about specific foods you should avoid based on the type of kidney stones and your overall health. Grains Breads. Bagels. Rolls. Baked goods. Salted crackers. Cereal. Pasta. Vegetables Spinach. Rhubarb. Beets. Canned vegetables. Pickles. Olives. Meats and other protein foods Nuts. Nut butters. Large portions of meat, poultry, or fish. Salted or cured meats. Deli meats. Hot dogs. Sausages. Dairy Cheese. Beverages Regular soft drinks. Regular vegetable juice. Seasonings and other foods Seasoning blends with salt. Salad dressings. Canned soups. Soy sauce. Ketchup. Barbecue sauce. Canned pasta sauce. Casseroles. Pizza. Lasagna. Frozen meals. Potato chips. French fries. Summary  You can reduce your risk of kidney stones by making changes to your diet.  The most important thing you can do is drink enough fluid. You should drink enough fluid to keep your urine clear or pale yellow.  Ask your health care provider or dietitian how much protein from animal sources you should eat each day, and also how much salt and calcium you should have each day. This information is not intended to replace advice given to you by your health care provider. Make sure you discuss any questions you have with your health care provider. Document Released: 04/02/2011 Document Revised: 11/16/2016 Document Reviewed: 11/16/2016 Elsevier Interactive Patient Education  2018 Elsevier [employer]  

## 2018-06-12 LAB — STONE ANALYSIS
CA PHOS CRY STONE QL IR: 10 %
Ca Oxalate,Dihydrate: 15 %
Ca Oxalate,Monohydr.: 75 %
STONE WEIGHT: 5.4 mg

## 2018-06-16 ENCOUNTER — Telehealth: Payer: Self-pay | Admitting: Nurse Practitioner

## 2018-06-21 NOTE — Telephone Encounter (Signed)
Unable to reach patient this encounter will be closed.

## 2018-06-24 ENCOUNTER — Ambulatory Visit (INDEPENDENT_AMBULATORY_CARE_PROVIDER_SITE_OTHER): Payer: 59 | Admitting: Family

## 2018-06-24 ENCOUNTER — Encounter: Payer: Self-pay | Admitting: Family

## 2018-06-24 VITALS — BP 122/75 | HR 75 | Temp 98.1°F | Ht 62.0 in | Wt 135.0 lb

## 2018-06-24 DIAGNOSIS — J069 Acute upper respiratory infection, unspecified: Secondary | ICD-10-CM

## 2018-06-24 DIAGNOSIS — J029 Acute pharyngitis, unspecified: Secondary | ICD-10-CM | POA: Diagnosis not present

## 2018-06-24 DIAGNOSIS — Z87442 Personal history of urinary calculi: Secondary | ICD-10-CM | POA: Diagnosis not present

## 2018-06-24 NOTE — Patient Instructions (Signed)
Upper Respiratory Infection, Adult Most upper respiratory infections (URIs) are caused by a virus. A URI affects the nose, throat, and upper air passages. The most common type of URI is often called "the common cold." Follow these instructions at home:  Take medicines only as told by your doctor.  Gargle warm saltwater or take cough drops to comfort your throat as told by your doctor.  Use a warm mist humidifier or inhale steam from a shower to increase air moisture. This may make it easier to breathe.  Drink enough fluid to keep your pee (urine) clear or pale yellow.  Eat soups and other clear broths.  Have a healthy diet.  Rest as needed.  Go back to work when your fever is gone or your doctor says it is okay. ? You may need to stay home longer to avoid giving your URI to others. ? You can also wear a face mask and wash your hands often to prevent spread of the virus.  Use your inhaler more if you have asthma.  Do not use any tobacco products, including cigarettes, chewing tobacco, or electronic cigarettes. If you need help quitting, ask your doctor. Contact a doctor if:  You are getting worse, not better.  Your symptoms are not helped by medicine.  You have chills.  You are getting more short of breath.  You have brown or red mucus.  You have yellow or brown discharge from your nose.  You have pain in your face, especially when you bend forward.  You have a fever.  You have puffy (swollen) neck glands.  You have pain while swallowing.  You have white areas in the back of your throat. Get help right away if:  You have very bad or constant: ? Headache. ? Ear pain. ? Pain in your forehead, behind your eyes, and over your cheekbones (sinus pain). ? Chest pain.  You have long-lasting (chronic) lung disease and any of the following: ? Wheezing. ? Long-lasting cough. ? Coughing up blood. ? A change in your usual mucus.  You have a stiff neck.  You have  changes in your: ? Vision. ? Hearing. ? Thinking. ? Mood. This information is not intended to replace advice given to you by your health care provider. Make sure you discuss any questions you have with your health care provider. Document Released: 05/24/2008 Document Revised: 08/08/2016 Document Reviewed: 03/13/2014 Elsevier Interactive Patient Education  2018 [employer] Inc[employer]  

## 2018-06-24 NOTE — Progress Notes (Signed)
   Subjective:    Patient ID: Sandra Adams, female    DOB: [birth year], 52 y.o.   MRN: [id number]  Chief Complaint  Patient presents with  . Sore Throat    cough  . recheck urine    recent kidney stone - was pos for RBC    Pt presents to the office today for sore throat and requesting urine to be retested for blood. States last month she passed a stone and had blood in her urine and she would like to make sure the blood has resolved.  Sore Throat   This is a new problem. The current episode started in the past 7 days. The problem has been gradually worsening. There has been no fever. The pain is at a severity of 3/10. The pain is mild. Associated symptoms include congestion, coughing, headaches and a hoarse voice. Pertinent negatives include no ear pain, swollen glands or trouble swallowing. She has tried acetaminophen, gargles and NSAIDs for the symptoms. The treatment provided mild relief.  Cough  The current episode started in the past 7 days. The problem occurs every few hours. The cough is non-productive. Associated symptoms include headaches. Pertinent negatives include no ear pain.      Review of Systems  HENT: Positive for congestion and hoarse voice. Negative for ear pain and trouble swallowing.   Respiratory: Positive for cough.   Neurological: Positive for headaches.  All other systems reviewed and are negative.      Objective:   Physical Exam  Constitutional: She is oriented to person, place, and time. She appears well-developed and well-nourished. No distress.  HENT:  Head: Normocephalic and atraumatic.  Right Ear: External ear normal.  Mouth/Throat: Posterior oropharyngeal erythema present.  Eyes: Pupils are equal, round, and reactive to light.  Neck: Normal range of motion. Neck supple. No thyromegaly present.  Cardiovascular: Normal rate, regular rhythm, normal heart sounds and intact distal pulses.  No murmur heard. Pulmonary/Chest: Effort normal and breath  sounds normal. No respiratory distress. She has no wheezes.  Abdominal: Soft. Bowel sounds are normal. She exhibits no distension. There is no tenderness.  Musculoskeletal: Normal range of motion. She exhibits no edema or tenderness.  Neurological: She is alert and oriented to person, place, and time. She has normal reflexes. No cranial nerve deficit.  Skin: Skin is warm and dry.  Psychiatric: She has a normal mood and affect. Her behavior is normal. Judgment and thought content normal.  Vitals reviewed.     BP 122/75 (BP Location: Left Arm)   Pulse 75   Temp 98.1 F (36.7 C) (Oral)   Ht 5\' 2"  (1.575 m)   Wt 135 lb (61.2 kg)   LMP 10/16/2014   BMI 24.69 kg/m      Assessment & Plan:  Teaghan was seen today for sore throat and recheck urine.  Diagnoses and all orders for this visit:  Sore throat -     Rapid Strep Screen ([facility] ONLY)  Viral upper respiratory tract infection  History of kidney stones -     Urinalysis -     Urine Culture   - Take meds as prescribed - Use a cool mist humidifier  -Use saline nose sprays frequently -Force fluids -For any cough or congestion  Use plain Mucinex- regular strength or max strength is fine -For fever or aces or pains- take tylenol or ibuprofen. -Throat lozenges if help    [name], FNP

## 2018-06-26 ENCOUNTER — Other Ambulatory Visit: Payer: 59 | Admitting: Obstetrics and Gynecology

## 2018-06-26 LAB — URINALYSIS
Bilirubin, UA: NEGATIVE
GLUCOSE, UA: NEGATIVE
Ketones, UA: NEGATIVE
Nitrite, UA: NEGATIVE
PH UA: 6 (ref 5.0–7.5)
Protein, UA: NEGATIVE
Specific Gravity, UA: 1.025 (ref 1.005–1.030)
UUROB: 0.2 mg/dL (ref 0.2–1.0)

## 2018-06-26 LAB — RAPID STREP SCREEN (MED CTR MEBANE ONLY): Strep Gp A Ag, IA W/Reflex: NEGATIVE

## 2018-06-26 LAB — CULTURE, GROUP A STREP

## 2018-06-26 LAB — URINE CULTURE

## 2018-06-27 ENCOUNTER — Ambulatory Visit (HOSPITAL_COMMUNITY): Payer: Self-pay

## 2018-06-27 ENCOUNTER — Encounter: Payer: Self-pay | Admitting: Family

## 2018-06-27 ENCOUNTER — Ambulatory Visit (INDEPENDENT_AMBULATORY_CARE_PROVIDER_SITE_OTHER): Payer: 59 | Admitting: Family

## 2018-06-27 VITALS — BP 110/70 | HR 76 | Temp 98.1°F | Ht 62.0 in | Wt 136.0 lb

## 2018-06-27 DIAGNOSIS — J069 Acute upper respiratory infection, unspecified: Secondary | ICD-10-CM

## 2018-06-27 DIAGNOSIS — M159 Polyosteoarthritis, unspecified: Secondary | ICD-10-CM

## 2018-06-27 DIAGNOSIS — F411 Generalized anxiety disorder: Secondary | ICD-10-CM

## 2018-06-27 DIAGNOSIS — J301 Allergic rhinitis due to pollen: Secondary | ICD-10-CM | POA: Diagnosis not present

## 2018-06-27 MED ORDER — ALPRAZOLAM 0.25 MG PO TABS: 0.2500 mg | ORAL_TABLET | Freq: Two times a day (BID) | ORAL | 5 refills | Status: DC | PRN

## 2018-06-27 MED ORDER — CETIRIZINE HCL 10 MG PO TABS: 10.0000 mg | ORAL_TABLET | Freq: Every day | ORAL | 11 refills | Status: DC

## 2018-06-27 NOTE — Progress Notes (Signed)
   Subjective:    Patient ID: Sandra Adams, female    DOB: August 04, 1966, 52 y.o.   MRN: [id number]   Chief Complaint  Patient presents with  . hover round forms    Pt presents to the office today for hoverround. Pt walks into her appointments. I have never seen her walk with a cane or walker. She reports she has intermittent left knee pain and left hip pain. She states she walks to the grocery store, but has a hard time to walking back from the grocery store. She has high, heel wedged shoes on at today's visit.  Arthritis  Presents for follow-up visit. She complains of pain and stiffness. The symptoms have been stable. Affected locations include the left knee. Her pain is at a severity of 9/10.  URI   This is a recurrent problem. The current episode started 1 to 4 weeks ago. The problem has been waxing and waning. There has been no fever. Associated symptoms include congestion, coughing and rhinorrhea. She has tried decongestant and antihistamine for the symptoms. The treatment provided mild relief.      Review of Systems  HENT: Positive for congestion and rhinorrhea.   Respiratory: Positive for cough.   Musculoskeletal: Positive for arthritis and stiffness.  All other systems reviewed and are negative.      Objective:   Physical Exam  Constitutional: She is oriented to person, place, and time. She appears well-developed and well-nourished. No distress.  HENT:  Head: Normocephalic and atraumatic.  Right Ear: External ear normal.  Left Ear: External ear normal.  Nose: Rhinorrhea present.  Mouth/Throat: Oropharynx is clear and moist.  Eyes: Pupils are equal, round, and reactive to light.  Neck: Normal range of motion. Neck supple. No thyromegaly present.  Cardiovascular: Normal rate, regular rhythm, normal heart sounds and intact distal pulses.  No murmur heard. Pulmonary/Chest: Effort normal and breath sounds normal. No respiratory distress. She has no wheezes.  Abdominal:  Soft. Bowel sounds are normal. She exhibits no distension. There is no tenderness.  Musculoskeletal: Normal range of motion. She exhibits no edema or tenderness.  Mld pain in right knee with flexion  Neurological: She is alert and oriented to person, place, and time. She has normal reflexes. No cranial nerve deficit.  Skin: Skin is warm and dry.  Psychiatric: She has a normal mood and affect. Her behavior is normal. Judgment and thought content normal.  Vitals reviewed.    BP 110/70   Pulse 76   Temp 98.1 F (36.7 C) (Oral)   Ht 5\' 2"  (1.575 m)   Wt 136 lb (61.7 kg)   LMP 10/16/2014   BMI 24.87 kg/m      Assessment & Plan:  Sandra Adams comes in today with chief complaint of hover round forms   Diagnosis and orders addressed:  1. Osteoarthritis of multiple joints, unspecified osteoarthritis type Continue motrin Discussed we could not complete paperwork today for Hooverround  ROM exercises encouraged Discouraged high heel shoes  2. Viral upper respiratory tract infection Continue flonase Start zyrtec  3. Allergic rhinitis due to pollen, unspecified seasonality - cetirizine (ZYRTEC) 10 MG tablet; Take 1 tablet (10 mg total) by mouth daily.  Dispense: 30 tablet; Refill: 11  4. GAD (generalized anxiety disorder) - ALPRAZolam (XANAX) 0.25 MG tablet; Take 1 tablet (0.25 mg total) by mouth 2 (two) times daily as needed for anxiety.  Dispense: 60 tablet; Refill: [place], FNP

## 2018-06-27 NOTE — Patient Instructions (Signed)

## 2018-06-29 ENCOUNTER — Ambulatory Visit (HOSPITAL_COMMUNITY)
Admission: RE | Admit: 2018-06-29 | Discharge: 2018-06-29 | Disposition: A | Discharge status: Home / Self Care | Payer: 59 | Source: Ambulatory Visit | Attending: Nurse Practitioner | Admitting: Nurse Practitioner

## 2018-06-29 ENCOUNTER — Telehealth: Payer: Self-pay | Admitting: Family

## 2018-06-29 ENCOUNTER — Ambulatory Visit (HOSPITAL_COMMUNITY): Payer: Medicare Other

## 2018-06-29 DIAGNOSIS — R911 Solitary pulmonary nodule: Secondary | ICD-10-CM | POA: Diagnosis present

## 2018-06-29 DIAGNOSIS — N200 Calculus of kidney: Secondary | ICD-10-CM | POA: Insufficient documentation

## 2018-06-29 NOTE — Telephone Encounter (Signed)
Normal culture, message left on voice mail.

## 2018-07-05 ENCOUNTER — Telehealth: Payer: Self-pay | Admitting: Family

## 2018-07-05 NOTE — Telephone Encounter (Signed)
Please review CT and advise on results.

## 2018-07-05 NOTE — Telephone Encounter (Signed)
CT scan looks fine. "5 mm left lower lobe nodule. No follow-up needed if patient is low-risk. Non-contrast chest CT can be considered in 12 months if patient is high-risk." From the chart she is a non-smoker, probably low risk so would not need to have repeat. Can discuss with PCP at next visit.  FYI to PCP

## 2018-07-05 NOTE — Telephone Encounter (Signed)
Pt aware.

## 2018-07-07 ENCOUNTER — Telehealth: Payer: Self-pay | Admitting: Family

## 2018-07-07 DIAGNOSIS — R911 Solitary pulmonary nodule: Secondary | ICD-10-CM

## 2018-07-07 NOTE — Telephone Encounter (Signed)
Pulmonary referral sent

## 2018-07-07 NOTE — Telephone Encounter (Signed)
Pt states that on CT Scan they found a spot on her lung and she is wanting to know if christy wants to see her or does christy want to go ahead a refer her to a specialist

## 2018-07-11 ENCOUNTER — Telehealth: Payer: Self-pay | Admitting: Family

## 2018-07-11 NOTE — Telephone Encounter (Signed)
Notes recorded by Chevis Pretty, FNP on 06/16/2018 at 12:01 PM EDT Stone analysis- was calcium oxylate stone- decrease tea consumption- increase water intake

## 2018-07-13 ENCOUNTER — Encounter: Payer: Self-pay | Admitting: *Deleted

## 2018-07-13 NOTE — Telephone Encounter (Signed)
Letter with result mailed to patient.

## 2018-07-19 ENCOUNTER — Telehealth: Payer: Self-pay | Admitting: Family

## 2018-07-19 NOTE — Telephone Encounter (Signed)
Pt notified of CT results Pt will keep pulmonary appt

## 2018-07-21 ENCOUNTER — Ambulatory Visit: Payer: 59 | Admitting: Family

## 2018-07-23 NOTE — Progress Notes (Deleted)
Synopsis: Referred in August 2019 for a 23mm pulmonary nodule.  Subjective:   PATIENT ID: Sandra Adams GENDER: female DOB: August 13, 1966, MRN: [id number]  HPI  No chief complaint on file.   ***  Past Medical History:  Diagnosis Date  . Anxiety   . Depression   . Menopausal symptom   . Migraine   . Scoliosis      Family History  Problem Relation Age of Onset  . Epilepsy Mother   . COPD Father   . Hyperlipidemia Father   . Heart disease Father   . Cancer Father   . COPD Sister   . Anxiety disorder Sister   . COPD Maternal Aunt   . Diabetes Maternal Uncle   . Diabetes Maternal Aunt        x2  . Colon cancer Neg Hx   . Colon polyps Neg Hx   . Esophageal cancer Neg Hx   . Gallbladder disease Neg Hx      Social History   Socioeconomic History  . Marital status: Divorced    Spouse name: Not on file  . Number of children: 0  . Years of education: Not on file  . Highest education level: Not on file  Occupational History  . Occupation: [employer]    Comment: Back : [employer]ol[employer] Knee surgery   Social Needs  . Financial resource strain: Not on file  . Food insecurity:    Worry: Not on file    Inability: Not on file  . Transportation needs:    Medical: Not on file    Non-medical: Not on file  Tobacco Use  . Smoking status: Never Smoker  . Smokeless tobacco: Never Used  Substance and Sexual Activity  . Alcohol use: No  . Drug use: No  . Sexual activity: Not Currently    Partners: Male    Birth control/protection: None  Lifestyle  . Physical activity:    Days per week: Not on file    Minutes per session: Not on file  . Stress: Not on file  Relationships  . Social connections:    Talks on phone: Not on file    Gets together: Not on file    Attends religious service: Not on file    Active member of club or organization: Not on file    Attends meetings of clubs or organizations: Not on file    Relationship status: Not on file  . Intimate partner violence:      Fear of current or ex partner: Not on file    Emotionally abused: Not on file    Physically abused: Not on file    Forced sexual activity: Not on file  Other Topics Concern  . Not on file  Social History Narrative   Single,no children   Right handed   12 th grade   No caffeine     Allergies  Allergen Reactions  . Ciprofloxacin     Makes her feel weak,dizzy  . Imitrex [Sumatriptan]     Blurred vision   . Prednisone Nausea And Vomiting  . Tylenol [Acetaminophen]     Tylenol #3,Headaches  . Vicodin [Hydrocodone-Acetaminophen]     GI upset     Outpatient Medications Prior to Visit  Medication Sig Dispense Refill  . ALPRAZolam (XANAX) 0.25 MG tablet Take 1 tablet (0.25 mg total) by mouth 2 (two) times daily as needed for anxiety. 60 tablet 5  . cetirizine (ZYRTEC) 10 MG tablet Take 1 tablet (10 mg total)  by mouth daily. 30 tablet 11  . fluticasone (FLONASE) 50 MCG/ACT nasal spray Place 2 sprays into both nostrils daily. 16 g 6  . IBU 600 MG tablet TAKE 1 TABLET BY MOUTH EVERY 8 HOURS AS NEEDED FOR MILD OR MODERATE PAIN 30 tablet 0  . meclizine (ANTIVERT) 25 MG tablet Take 1 tablet (25 mg total) by mouth 3 (three) times daily as needed for dizziness. 60 tablet 3  . pantoprazole (PROTONIX) 40 MG tablet Take 1 tablet (40 mg total) by mouth daily. For stomach 30 tablet 11  . Probiotic Product (PRO-BIOTIC BLEND) CAPS Take 1 capsule by mouth daily. 30 capsule 1  . solifenacin (VESICARE) 5 MG tablet Take 1 tablet (5 mg total) by mouth daily. For bladder spasms. 30 tablet 2  . traMADol-acetaminophen (ULTRACET) 37.5-325 MG tablet TAKE 2 TABLETS BY MOUTH EVERY 6 TO 8 HOURS AS NEEDED MAX 8/24 HRS 60 tablet 2   No facility-administered medications prior to visit.     ROS   Objective:  Physical Exam   There were no vitals filed for this visit.   on *** LPM *** RA BMI Readings from Last 3 Encounters:  06/27/18 24.87 kg/m  06/24/18 24.69 kg/m  06/07/18 25.53 kg/m   Wt  Readings from Last 3 Encounters:  06/27/18 136 lb (61.7 kg)  06/24/18 135 lb (61.2 kg)  06/07/18 139 lb 9.6 oz (63.3 kg)    ***  CBC    Component Value Date/Time   WBC 11.1 (H) 06/03/2017 1530   WBC 7.8 03/11/2015 1056   RBC 4.22 06/03/2017 1530   RBC 4.37 03/11/2015 1056   HGB 13.7 06/03/2017 1530   HCT 40.3 06/03/2017 1530   PLT 243 06/03/2017 1530   MCV 96 06/03/2017 1530   MCH 32.5 06/03/2017 1530   MCH 30.2 03/11/2015 1056   MCHC 34.0 06/03/2017 1530   MCHC 31.4 (A) 03/11/2015 1056   RDW 13.2 06/03/2017 1530   LYMPHSABS 4.2 (H) 06/03/2017 1530   EOSABS 0.3 06/03/2017 1530   BASOSABS 0.1 06/03/2017 1530    ***  Chest Imaging:  06/2018 CT Chest reveals a LLL 52mm pulmonary nodules with smooth edges and no significant high risk imaging features (my read).  Pulmonary Functions Testing Results: No results found for: FEV1, FVC, FEV1FVC, TLC, DLCO  FeNO: None   Pathology: None   Echocardiogram: ***  Heart Catheterization: ***    Assessment & Plan:   No diagnosis found.  Discussion: ***   Current Outpatient Medications:  .  ALPRAZolam (XANAX) 0.25 MG tablet, Take 1 tablet (0.25 mg total) by mouth 2 (two) times daily as needed for anxiety., Disp: 60 tablet, Rfl: 5 .  cetirizine (ZYRTEC) 10 MG tablet, Take 1 tablet (10 mg total) by mouth daily., Disp: 30 tablet, Rfl: 11 .  fluticasone (FLONASE) 50 MCG/ACT nasal spray, Place 2 sprays into both nostrils daily., Disp: 16 g, Rfl: 6 .  IBU 600 MG tablet, TAKE 1 TABLET BY MOUTH EVERY 8 HOURS AS NEEDED FOR MILD OR MODERATE PAIN, Disp: 30 tablet, Rfl: 0 .  meclizine (ANTIVERT) 25 MG tablet, Take 1 tablet (25 mg total) by mouth 3 (three) times daily as needed for dizziness., Disp: 60 tablet, Rfl: 3 .  pantoprazole (PROTONIX) 40 MG tablet, Take 1 tablet (40 mg total) by mouth daily. For stomach, Disp: 30 tablet, Rfl: 11 .  Probiotic Product (PRO-BIOTIC BLEND) CAPS, Take 1 capsule by mouth daily., Disp: 30 capsule, Rfl:  1 .  solifenacin (VESICARE) 5  MG tablet, Take 1 tablet (5 mg total) by mouth daily. For bladder spasms., Disp: 30 tablet, Rfl: 2 .  traMADol-acetaminophen (ULTRACET) 37.5-325 MG tablet, TAKE 2 TABLETS BY MOUTH EVERY 6 TO 8 HOURS AS NEEDED MAX 8/24 HRS, Disp: 60 tablet, Rfl: 2  Garner Nash, DO [facility] 07/23/2018 9:49 PM

## 2018-07-24 ENCOUNTER — Institutional Professional Consult (permissible substitution): Payer: 59 | Admitting: Pulmonary Disease

## 2018-07-24 ENCOUNTER — Ambulatory Visit (INDEPENDENT_AMBULATORY_CARE_PROVIDER_SITE_OTHER): Payer: 59 | Admitting: Family

## 2018-07-24 ENCOUNTER — Encounter: Payer: Self-pay | Admitting: Family

## 2018-07-24 VITALS — BP 127/78 | HR 82 | Temp 97.9°F | Ht 62.0 in | Wt 134.4 lb

## 2018-07-24 DIAGNOSIS — G4700 Insomnia, unspecified: Secondary | ICD-10-CM | POA: Diagnosis not present

## 2018-07-24 DIAGNOSIS — N3000 Acute cystitis without hematuria: Secondary | ICD-10-CM | POA: Diagnosis not present

## 2018-07-24 DIAGNOSIS — F411 Generalized anxiety disorder: Secondary | ICD-10-CM | POA: Diagnosis not present

## 2018-07-24 DIAGNOSIS — R109 Unspecified abdominal pain: Secondary | ICD-10-CM | POA: Diagnosis not present

## 2018-07-24 LAB — URINALYSIS, COMPLETE
Bilirubin, UA: NEGATIVE
GLUCOSE, UA: NEGATIVE
KETONES UA: NEGATIVE
Nitrite, UA: NEGATIVE
Protein, UA: NEGATIVE
RBC, UA: NEGATIVE
SPEC GRAV UA: 1.01 (ref 1.005–1.030)
Urobilinogen, Ur: 0.2 mg/dL (ref 0.2–1.0)
pH, UA: 7 (ref 5.0–7.5)

## 2018-07-24 LAB — MICROSCOPIC EXAMINATION: RENAL EPITHEL UA: NONE SEEN /HPF

## 2018-07-24 MED ORDER — ALPRAZOLAM 0.25 MG PO TABS: 0.2500 mg | ORAL_TABLET | Freq: Two times a day (BID) | ORAL | 5 refills | Status: DC | PRN

## 2018-07-24 MED ORDER — IBUPROFEN 600 MG PO TABS: ORAL_TABLET | ORAL | 1 refills | Status: DC

## 2018-07-24 MED ORDER — NITROFURANTOIN MONOHYD MACRO 100 MG PO CAPS: 100.0000 mg | ORAL_CAPSULE | Freq: Two times a day (BID) | ORAL | 0 refills | Status: DC

## 2018-07-24 NOTE — Patient Instructions (Signed)

## 2018-07-24 NOTE — Progress Notes (Addendum)
   Subjective:    Patient ID: Sandra Adams, female    DOB: Oct 27, 1966, 52 y.o.   MRN: [id number]  Chief Complaint  Patient presents with  . Abdominal Pain    Abdominal Pain  This is a recurrent problem. The current episode started in the past 7 days. The onset quality is gradual. The problem occurs intermittently. The pain is located in the suprapubic region. The pain is at a severity of 1/10. The pain is mild. The abdominal pain does not radiate. Associated symptoms include dysuria and frequency. Pertinent negatives include no hematuria, nausea or vomiting. She has tried nothing for the symptoms. The treatment provided no relief.  Anxiety  Presents for follow-up visit. Symptoms include excessive worry, insomnia, irritability and nervous/anxious behavior. Patient reports no nausea. Symptoms occur most days. The severity of symptoms is moderate. The quality of sleep is good.    Insomnia  Primary symptoms: difficulty falling asleep.  The current episode started more than one month. The problem occurs nightly. The problem has been waxing and waning since onset. The symptoms are aggravated by anxiety. The treatment provided no relief.      Review of Systems  Constitutional: Positive for irritability.  Gastrointestinal: Positive for abdominal pain. Negative for nausea and vomiting.  Genitourinary: Positive for dysuria and frequency. Negative for hematuria.  Psychiatric/Behavioral: The patient is nervous/anxious and has insomnia.   All other systems reviewed and are negative.      Objective:   Physical Exam  Constitutional: She is oriented to person, place, and time. She appears well-developed and well-nourished. No distress.  HENT:  Head: Normocephalic and atraumatic.  Right Ear: External ear normal.  Mouth/Throat: Oropharynx is clear and moist.  Eyes: Pupils are equal, round, and reactive to light.  Neck: Normal range of motion. Neck supple. No thyromegaly present.    Cardiovascular: Normal rate, regular rhythm, normal heart sounds and intact distal pulses.  No murmur heard. Pulmonary/Chest: Effort normal and breath sounds normal. No respiratory distress. She has no wheezes.  Abdominal: Soft. Bowel sounds are normal. She exhibits no distension. There is tenderness (mild lower abd ).  Musculoskeletal: Normal range of motion. She exhibits no edema or tenderness.  Neurological: She is alert and oriented to person, place, and time. She has normal reflexes. No cranial nerve deficit.  Skin: Skin is warm and dry.  Psychiatric: She has a normal mood and affect. Her behavior is normal. Judgment and thought content normal.  Vitals reviewed.     BP 127/78   Pulse 82   Temp 97.9 F (36.6 C) (Oral)   Ht 5\' 2"  (1.575 m)   Wt 134 lb 6.4 oz (61 kg)   LMP 10/16/2014   BMI 24.58 kg/m      Assessment & Plan:  SYNTHIA FAIRBANK comes in today with chief complaint of Abdominal Pain   Diagnosis and orders addressed:  1. Abdominal pressure - Urinalysis, Complete  2. GAD (generalized anxiety disorder) - ALPRAZolam (XANAX) 0.25 MG tablet; Take 1 tablet (0.25 mg total) by mouth 2 (two) times daily as needed for anxiety.  Dispense: 60 tablet; Refill: 5  3. Acute cystitis without hematuria Force fluids AZO over the counter X2 days RTO prn Culture pending - Urine Culture - nitrofurantoin, macrocrystal-monohydrate, (MACROBID) 100 MG capsule; Take 1 capsule (100 mg total) by mouth 2 (two) times daily.  Dispense: 10 capsule; Refill: 0  4. Insomnia, unspecified type Start melatonin Sleep ritual    [name], [place]

## 2018-07-25 LAB — URINE CULTURE

## 2018-08-07 ENCOUNTER — Other Ambulatory Visit: Payer: 59 | Admitting: Adult Health

## 2018-08-09 ENCOUNTER — Institutional Professional Consult (permissible substitution): Payer: 59 | Admitting: Pulmonary Disease

## 2018-08-14 ENCOUNTER — Ambulatory Visit: Payer: 59 | Admitting: Family Medicine

## 2018-08-16 ENCOUNTER — Encounter: Payer: 59 | Admitting: Family

## 2018-08-16 ENCOUNTER — Encounter: Payer: Self-pay | Admitting: Family

## 2018-08-19 ENCOUNTER — Encounter: Payer: Self-pay | Admitting: Family

## 2018-08-29 ENCOUNTER — Encounter: Payer: 59 | Admitting: Family

## 2018-08-30 ENCOUNTER — Institutional Professional Consult (permissible substitution): Payer: 59 | Admitting: Pulmonary Disease

## 2018-09-01 ENCOUNTER — Institutional Professional Consult (permissible substitution): Payer: 59 | Admitting: Pulmonary Disease

## 2018-09-06 ENCOUNTER — Institutional Professional Consult (permissible substitution): Payer: 59 | Admitting: Pulmonary Disease

## 2018-09-06 ENCOUNTER — Telehealth: Payer: Self-pay | Admitting: Family

## 2018-09-07 NOTE — Telephone Encounter (Signed)
Please have her schedule this with GYN.

## 2018-09-07 NOTE — Telephone Encounter (Signed)
lmtcb

## 2018-09-07 NOTE — Telephone Encounter (Signed)
Why? Is she snoring or having day time fatigue?

## 2018-09-13 ENCOUNTER — Encounter: Payer: Self-pay | Admitting: Family

## 2018-09-13 ENCOUNTER — Ambulatory Visit (INDEPENDENT_AMBULATORY_CARE_PROVIDER_SITE_OTHER): Payer: 59 | Admitting: Family

## 2018-09-13 VITALS — BP 117/74 | HR 64 | Temp 97.6°F | Ht 62.0 in | Wt 133.0 lb

## 2018-09-13 DIAGNOSIS — F411 Generalized anxiety disorder: Secondary | ICD-10-CM | POA: Diagnosis not present

## 2018-09-13 DIAGNOSIS — F1129 Opioid dependence with unspecified opioid-induced disorder: Secondary | ICD-10-CM

## 2018-09-13 DIAGNOSIS — E663 Overweight: Secondary | ICD-10-CM

## 2018-09-13 DIAGNOSIS — R350 Frequency of micturition: Secondary | ICD-10-CM

## 2018-09-13 DIAGNOSIS — N3001 Acute cystitis with hematuria: Secondary | ICD-10-CM

## 2018-09-13 DIAGNOSIS — Z79899 Other long term (current) drug therapy: Secondary | ICD-10-CM

## 2018-09-13 LAB — URINALYSIS, COMPLETE
BILIRUBIN UA: NEGATIVE
Glucose, UA: NEGATIVE
NITRITE UA: NEGATIVE
Protein, UA: NEGATIVE
SPEC GRAV UA: 1.025 (ref 1.005–1.030)
UUROB: 0.2 mg/dL (ref 0.2–1.0)
pH, UA: 5.5 (ref 5.0–7.5)

## 2018-09-13 LAB — MICROSCOPIC EXAMINATION: Epithelial Cells (non renal): >10 /hpf — AB (ref 0–10)

## 2018-09-13 MED ORDER — ALPRAZOLAM 0.25 MG PO TABS: 0.2500 mg | ORAL_TABLET | Freq: Two times a day (BID) | ORAL | 5 refills | Status: DC | PRN

## 2018-09-13 MED ORDER — FLUCONAZOLE 150 MG PO TABS: 150.0000 mg | ORAL_TABLET | ORAL | 0 refills | Status: DC | PRN

## 2018-09-13 MED ORDER — CEPHALEXIN 500 MG PO CAPS: 500.0000 mg | ORAL_CAPSULE | Freq: Two times a day (BID) | ORAL | 0 refills | Status: DC

## 2018-09-13 NOTE — Patient Instructions (Signed)

## 2018-09-13 NOTE — Addendum Note (Signed)
Addended by: [name] A on: 09/13/2018 12:54 PM   Modules accepted: Orders

## 2018-09-13 NOTE — Progress Notes (Signed)
Subjective:    Patient ID: Sandra Adams, female    DOB: 10-Feb-1966, 52 y.o.   MRN: [id number]  Chief Complaint  Patient presents with  . Nocturia    Urinary Frequency   This is a recurrent problem. The current episode started in the past 7 days. The problem occurs every urination. The problem has been unchanged. The patient is experiencing no pain. There has been no fever. Associated symptoms include frequency, hesitancy, nausea and urgency. Pertinent negatives include no flank pain. She has tried increased fluids for the symptoms. The treatment provided mild relief.  Anxiety  Presents for follow-up visit. Symptoms include decreased concentration, excessive worry, insomnia, irritability, nausea, nervous/anxious behavior and restlessness. Symptoms occur most days. The severity of symptoms is moderate. The quality of sleep is good.        Review of Systems  Constitutional: Positive for irritability.  Gastrointestinal: Positive for nausea.  Genitourinary: Positive for frequency, hesitancy and urgency. Negative for flank pain.  Psychiatric/Behavioral: Positive for decreased concentration. The patient is nervous/anxious and has insomnia.   All other systems reviewed and are negative.      Objective:   Physical Exam  Constitutional: She is oriented to person, place, and time. She appears well-developed and well-nourished. No distress.  HENT:  Head: Normocephalic and atraumatic.  Right Ear: External ear normal.  Left Ear: External ear normal.  Mouth/Throat: Oropharynx is clear and moist.  Eyes: Pupils are equal, round, and reactive to light.  Neck: Normal range of motion. Neck supple. No thyromegaly present.  Cardiovascular: Normal rate, regular rhythm, normal heart sounds and intact distal pulses.  No murmur heard. Pulmonary/Chest: Effort normal and breath sounds normal. No respiratory distress. She has no wheezes.  Abdominal: Soft. Bowel sounds are normal. She exhibits no  distension. There is no tenderness.  Musculoskeletal: Normal range of motion. She exhibits no edema or tenderness.  Neurological: She is alert and oriented to person, place, and time. She has normal reflexes. No cranial nerve deficit.  Skin: Skin is warm and dry.  Psychiatric: She has a normal mood and affect. Her behavior is normal. Judgment and thought content normal.  Vitals reviewed.     BP 117/74   Pulse 64   Temp 97.6 F (36.4 C) (Oral)   Ht 5\' 2"  (1.575 m)   Wt 133 lb (60.3 kg)   LMP 10/16/2014   BMI 24.33 kg/m      Assessment & Plan:  LAELA DEVINEY comes in today with chief complaint of Nocturia   Diagnosis and orders addressed:  1. Urine frequency - Urine Culture - Urinalysis, Complete  2. GAD (generalized anxiety disorder) - ALPRAZolam (XANAX) 0.25 MG tablet; Take 1 tablet (0.25 mg total) by mouth 2 (two) times daily as needed for anxiety.  Dispense: 60 tablet; Refill: 5 - ToxASSURE Select 13 (MW), Urine  3. Opioid dependence with opioid-induced disorder (HCC) - ALPRAZolam (XANAX) 0.25 MG tablet; Take 1 tablet (0.25 mg total) by mouth 2 (two) times daily as needed for anxiety.  Dispense: 60 tablet; Refill: 5 - ToxASSURE Select 13 (MW), Urine  4. Overweight (BMI 25.0-29.9) - ALPRAZolam (XANAX) 0.25 MG tablet; Take 1 tablet (0.25 mg total) by mouth 2 (two) times daily as needed for anxiety.  Dispense: 60 tablet; Refill: 5  5. Acute cystitis with hematuria Force fluids AZO over the counter X2 days RTO prn Culture pending - cephALEXin (KEFLEX) 500 MG capsule; Take 1 capsule (500 mg total) by mouth 2 (two) times daily.  Dispense: 14 capsule; Refill: 0  6. Controlled substance agreement signed    Follow up plan: 6 months    [name], FNP

## 2018-09-13 NOTE — Telephone Encounter (Signed)
Patient has a follow up appointment scheduled. 

## 2018-09-14 LAB — URINE CULTURE

## 2018-09-18 LAB — TOXASSURE SELECT 13 (MW), URINE

## 2018-09-19 ENCOUNTER — Institutional Professional Consult (permissible substitution): Payer: 59 | Admitting: Pulmonary Disease

## 2018-09-22 ENCOUNTER — Ambulatory Visit: Payer: 59 | Admitting: Family Medicine

## 2018-09-28 ENCOUNTER — Telehealth: Payer: Self-pay | Admitting: Family

## 2018-09-28 NOTE — Telephone Encounter (Signed)
Pt will need to be seen. If not she can do an Evisit. Continue to do flonase and zyrtec.

## 2018-09-28 NOTE — Telephone Encounter (Addendum)
Phone call to patient, no answer, voicemail box full.

## 2018-09-28 NOTE — Telephone Encounter (Signed)
Please advise 

## 2018-09-29 ENCOUNTER — Telehealth: Payer: Self-pay | Admitting: *Deleted

## 2018-09-29 NOTE — Telephone Encounter (Signed)
Patient called requesting to have pap smear today since she is missed her last appt and is "in the area" and won't be back for a few months.  Informed patient we did not have any appointments for pap smears today but would be glad to reschedule her.  Pt verbalized understanding.Marland Kitchen Appt made.

## 2018-10-03 NOTE — Telephone Encounter (Signed)
No response. Note filed.

## 2018-10-09 ENCOUNTER — Other Ambulatory Visit: Payer: Self-pay | Admitting: Family Medicine

## 2018-10-09 DIAGNOSIS — J020 Streptococcal pharyngitis: Secondary | ICD-10-CM

## 2018-10-10 NOTE — Telephone Encounter (Signed)
Last seen 09/13/18  [facility]

## 2018-10-21 ENCOUNTER — Encounter: Payer: Self-pay | Admitting: Pediatrics

## 2018-10-21 ENCOUNTER — Ambulatory Visit (INDEPENDENT_AMBULATORY_CARE_PROVIDER_SITE_OTHER): Payer: 59 | Admitting: Pediatrics

## 2018-10-21 VITALS — BP 113/66 | HR 70 | Temp 97.8°F | Ht 62.0 in | Wt 134.0 lb

## 2018-10-21 DIAGNOSIS — R300 Dysuria: Secondary | ICD-10-CM | POA: Diagnosis not present

## 2018-10-21 DIAGNOSIS — B379 Candidiasis, unspecified: Secondary | ICD-10-CM | POA: Diagnosis not present

## 2018-10-21 DIAGNOSIS — N3090 Cystitis, unspecified without hematuria: Secondary | ICD-10-CM | POA: Diagnosis not present

## 2018-10-21 DIAGNOSIS — J069 Acute upper respiratory infection, unspecified: Secondary | ICD-10-CM | POA: Diagnosis not present

## 2018-10-21 MED ORDER — AMOXICILLIN 500 MG PO CAPS: 500.0000 mg | ORAL_CAPSULE | Freq: Two times a day (BID) | ORAL | 0 refills | Status: DC

## 2018-10-21 MED ORDER — FLUCONAZOLE 150 MG PO TABS: 150.0000 mg | ORAL_TABLET | ORAL | 0 refills | Status: DC | PRN

## 2018-10-21 NOTE — Progress Notes (Signed)
  Subjective:   Patient ID: Sandra Adams, female    DOB: 1966-02-20, 52 y.o.   MRN: [id number] CC: Urinary Tract Infection (left flank pain. Sees urology. just finished anitibiotics) and Sinusitis (nasal congestion. Wants a shot for her sinuses. Hasn't taken anything OTC.)  HPI: Sandra Adams is a 52 y.o. female   Recently seen by urologist, treated for urinary tract infection.  This was a couple weeks ago.  Initially says she does not have any dysuria, and then said she has had dysuria worsening for the last 3 to 4 days, burning with each void.  No fevers.  No abdominal pain, no vaginal discharge.  Last 3 to 4 days has had runny nose, scratchy throat, increased sinus congestion.  Took Advil Cold and Sinus couple days ago, is not sure if it helped much or not.  Not taking anything for last couple days.  Relevant past medical, surgical, family and social history reviewed. Allergies and medications reviewed and updated. Social History   Tobacco Use  Smoking Status Never Smoker  Smokeless Tobacco Never Used   ROS: Per HPI   Objective:    BP 113/66   Pulse 70   Temp 97.8 F (36.6 C) (Oral)   Ht 5\' 2"  (1.575 m)   Wt 134 lb (60.8 kg)   LMP 10/16/2014   BMI 24.51 kg/m   Wt Readings from Last 3 Encounters:  10/21/18 134 lb (60.8 kg)  09/13/18 133 lb (60.3 kg)  07/24/18 134 lb 6.4 oz (61 kg)    Gen: NAD, alert, cooperative with exam, NCAT EYES: EOMI, no conjunctival injection, or no icterus ENT:  TMs dull gray b/l, OP without erythema LYMPH: no cervical LAD CV: NRRR, normal S1/S2, no murmur, distal pulses 2+ b/l Resp: CTABL, no wheezes, normal WOB Abd: +BS, soft, NTND. no guarding or organomegaly, no CVA tenderness Ext: No edema, warm Neuro: Alert and oriented MSK: normal muscle bulk  Assessment & Plan:  Sarika was seen today for urinary tract infection and sinusitis.  Diagnoses and all orders for this visit:  Cystitis UA with 2+ LEU. With return of symptoms, will  treat with below, follow-up urine culture -     amoxicillin (AMOXIL) 500 MG capsule; Take 1 capsule (500 mg total) by mouth 2 (two) times daily.  Dysuria -     Urinalysis -     Urine Culture  Yeast infection Start below as needed for symptoms.  Usually gets them with antibiotics. -     fluconazole (DIFLUCAN) 150 MG tablet; Take 1 tablet (150 mg total) by mouth every three (3) days as needed.  Acute URI Symptoms can return precautions discussed  Follow up plan: As scheduled, sooner if needed Assunta Found, MD [place]

## 2018-10-22 LAB — URINE CULTURE

## 2018-10-23 LAB — URINALYSIS
BILIRUBIN UA: NEGATIVE
Glucose, UA: NEGATIVE
Ketones, UA: NEGATIVE
NITRITE UA: NEGATIVE
Specific Gravity, UA: 1.02 (ref 1.005–1.030)
UUROB: 0.2 mg/dL (ref 0.2–1.0)
pH, UA: 7 (ref 5.0–7.5)

## 2018-11-06 ENCOUNTER — Telehealth: Payer: Self-pay | Admitting: Family

## 2018-11-07 ENCOUNTER — Other Ambulatory Visit: Payer: Self-pay

## 2018-11-07 DIAGNOSIS — R911 Solitary pulmonary nodule: Secondary | ICD-10-CM

## 2018-11-21 ENCOUNTER — Other Ambulatory Visit: Payer: 59 | Admitting: Adult Health

## 2018-12-07 ENCOUNTER — Ambulatory Visit (INDEPENDENT_AMBULATORY_CARE_PROVIDER_SITE_OTHER): Payer: 59 | Admitting: Pediatrics

## 2018-12-07 ENCOUNTER — Encounter: Payer: Self-pay | Admitting: Pediatrics

## 2018-12-07 VITALS — BP 130/82 | HR 88 | Temp 98.4°F | Ht 62.0 in | Wt 142.4 lb

## 2018-12-07 DIAGNOSIS — N898 Other specified noninflammatory disorders of vagina: Secondary | ICD-10-CM

## 2018-12-07 DIAGNOSIS — R300 Dysuria: Secondary | ICD-10-CM | POA: Diagnosis not present

## 2018-12-07 DIAGNOSIS — N3001 Acute cystitis with hematuria: Secondary | ICD-10-CM

## 2018-12-07 DIAGNOSIS — R319 Hematuria, unspecified: Secondary | ICD-10-CM

## 2018-12-07 DIAGNOSIS — M545 Low back pain, unspecified: Secondary | ICD-10-CM

## 2018-12-07 LAB — URINALYSIS, COMPLETE
BILIRUBIN UA: NEGATIVE
Glucose, UA: NEGATIVE
KETONES UA: NEGATIVE
Nitrite, UA: NEGATIVE
PROTEIN UA: NEGATIVE
SPEC GRAV UA: 1.025 (ref 1.005–1.030)
Urobilinogen, Ur: 0.2 mg/dL (ref 0.2–1.0)
pH, UA: 5.5 (ref 5.0–7.5)

## 2018-12-07 LAB — WET PREP FOR TRICH, YEAST, CLUE
Clue Cell Exam: NEGATIVE
Trichomonas Exam: NEGATIVE
Yeast Exam: NEGATIVE

## 2018-12-07 LAB — MICROSCOPIC EXAMINATION: RENAL EPITHEL UA: NONE SEEN /HPF

## 2018-12-07 MED ORDER — NITROFURANTOIN MONOHYD MACRO 100 MG PO CAPS: 100.0000 mg | ORAL_CAPSULE | Freq: Two times a day (BID) | ORAL | 0 refills | Status: AC

## 2018-12-07 NOTE — Progress Notes (Signed)
Subjective:   Patient ID: Sandra Adams, female    DOB: 1966-11-24, 52 y.o.   MRN: [id number] CC: Vaginal Discharge; Back Pain; and Burning with urination  HPI: Sandra Adams is a 52 y.o. female   Symptoms has been happening off and on.  Has noticed some more vaginal discharge than usual at times, no change in color, odor of discharge.  Not really noticing much in her underwear.  Urine has had a stronger small recently..  Sometimes feels warm with urination, does not think it has been hurting.  No abdominal pain.  No fevers.  She is not able to hold her urine as well as she used to.  Saw urologist, was started on Vesicare.  She thinks it has been helping.  She is trying to empty her bladder as soon as she gets the urge rather than waiting.  Rarely drinking water, drinking sodas.  Regularly using douches to clean vaginal area.  No longer having periods, last 2-3 years ago.  Relevant past medical, surgical, family and social history reviewed. Allergies and medications reviewed and updated. Social History   Tobacco Use  Smoking Status Never Smoker  Smokeless Tobacco Never Used   ROS: Per HPI   Objective:    BP 130/82   Pulse 88   Temp 98.4 F (36.9 C) (Oral)   Ht 5\' 2"  (1.575 m)   Wt 142 lb 6.4 oz (64.6 kg)   LMP 10/16/2014   BMI 26.05 kg/m   Wt Readings from Last 3 Encounters:  12/07/18 142 lb 6.4 oz (64.6 kg)  10/21/18 134 lb (60.8 kg)  09/13/18 133 lb (60.3 kg)    Gen: NAD, alert, cooperative with exam, NCAT EYES: EOMI, no conjunctival injection, or no icterus ENT: OP without erythema LYMPH: no cervical LAD CV: NRRR, normal S1/S2, no murmur, distal pulses 2+ b/l Resp: CTABL, no wheezes, normal WOB Abd: +BS, soft, NTND. no guarding or organomegaly Ext: No edema, warm Neuro: Alert and oriented, strength equal b/l UE and LE, coordination grossly normal MSK: normal muscle bulk  Assessment & Plan:  Royann was seen today for vaginal discharge, back pain and burning  with urination.  Diagnoses and all orders for this visit:  Vaginal discharge Wet prep negative.  Use water only to clean in genital area, avoid sit down baths, no douches with water or other product into vagina. -     Urine Culture; Future -     Urinalysis, Complete -     WET PREP FOR TRICH, YEAST, CLUE -     Urine Culture  Acute low back pain without sciatica, unspecified back pain laterality -     Urine Culture; Future -     Urinalysis, Complete -     WET PREP FOR TRICH, YEAST, CLUE -     Urine Culture  Burning with urination -     Urine Culture; Future -     Urinalysis, Complete -     WET PREP FOR TRICH, YEAST, CLUE -     Urine Culture  Acute cystitis with hematuria Positive UA with leukocytes and RBCs.  A follow-up culture.  Start below.  Following with urology.  If ongoing symptoms, may need to be seen back by them. -     nitrofurantoin, macrocrystal-monohydrate, (MACROBID) 100 MG capsule; Take 1 capsule (100 mg total) by mouth 2 (two) times daily for 5 days.  Hematuria, unspecified type  Other orders -     Microscopic Examination   Follow up  plan: Return in about 2 weeks (around 12/21/2018) for for full exam if not improving. Assunta Found, MD [place]

## 2018-12-07 NOTE — Patient Instructions (Addendum)
Use lotion twice a day on itchy areas.  Try to increase water intake.  Can use over-the-counter hydrocortisone as needed for dry scaly places on arms.  Any itching bleeding changing places on skin, let us know.  Avoid soap, douche-ing of vaginal area. Water only to clean.   Skin Care in Dry, Itchy skin Bathing Take brief (less than 5 minutes) showers or baths using cool or lukewarm water (not hot).  Bathe every other day at most.  Avoid the following soaps: Dial, [place], [place] spring, fragranced soaps.  Use non-fragranced Dove wash and apply only to areas that need it (armpits, groin, feet).  Pat you skin dry (do not rub) leaving a thin film of water on your skin.  Apply moisturizers within 5 minutes of bathing.   Moisturizers Apply a moisturizer to your skin at least once a day (even if you do not bathe).  Cool moisturizers on your skin help with itching (to accomplish this, place your moisturizers and medicated creams in the refrigerator).  In general, people with dry skin need a moisturizer that is scooped and not squirted.  Ointments (petrolatum ointment): greasy, but are the best moisturizers  Vaseline, Aquaphor, Crisco  Creams (thick, white cream that comes in a jar and is scooped with your hand)  Cerave, Cetaphil, Eucerin, Vanicream  Lotions (comes in a pump) is the weakest moisturizer but is a good choice for the face  Cetaphil, Cerave, Curel, Neutrogena, Lubriderm, Aveeno  Laundry soap Dye-free, fragrance-free laundry products are recommended.  Avoid wool clothing.  Do not scratch Scratching makes the skin even more itchy and leads to the "Itch-Scratch Cycle".  Here are some ways to get relief from itching WITHOUT scratching:  Fraser Din the areas  Cut your fingernails short  Cool your moisturizer in the refrigerator (cold decreases itching)  Put a bandage over an itchy area to prevent scratching  Find other activities to distract you like games, stress balls or a creative  project

## 2018-12-08 LAB — URINE CULTURE: ORGANISM ID, BACTERIA: NO GROWTH

## 2019-01-01 ENCOUNTER — Other Ambulatory Visit: Payer: 59 | Admitting: Adult Health

## 2019-01-10 ENCOUNTER — Other Ambulatory Visit: Payer: Self-pay | Admitting: Adult Health

## 2019-01-15 ENCOUNTER — Other Ambulatory Visit: Payer: Self-pay | Admitting: Family

## 2019-01-16 ENCOUNTER — Other Ambulatory Visit: Payer: Self-pay | Admitting: *Deleted

## 2019-01-16 DIAGNOSIS — N951 Menopausal and female climacteric states: Secondary | ICD-10-CM

## 2019-01-16 DIAGNOSIS — R232 Flushing: Secondary | ICD-10-CM

## 2019-01-24 ENCOUNTER — Other Ambulatory Visit: Payer: Self-pay | Admitting: Adult Health

## 2019-01-31 ENCOUNTER — Encounter: Payer: 59 | Admitting: Family

## 2019-02-01 ENCOUNTER — Encounter: Payer: Self-pay | Admitting: Family

## 2019-02-05 IMAGING — CT CT CHEST W/O CM
2 of 3 series · 15 of 36 positions shown, 18 images · non-contrast
Comparison: CT abdomen pelvis 05/31/2018.

CLINICAL DATA: Pulmonary nodule.

EXAM:
CT CHEST WITHOUT CONTRAST
TECHNIQUE: Multidetector CT imaging of the chest was performed following the
standard protocol without IV contrast.

[Series 2: thorax · axial · 0.52mm/px · z∈[+1370,+1632]mm · 12 of 155 slices shown, 15 images]
[im 12/155  mediastinal]
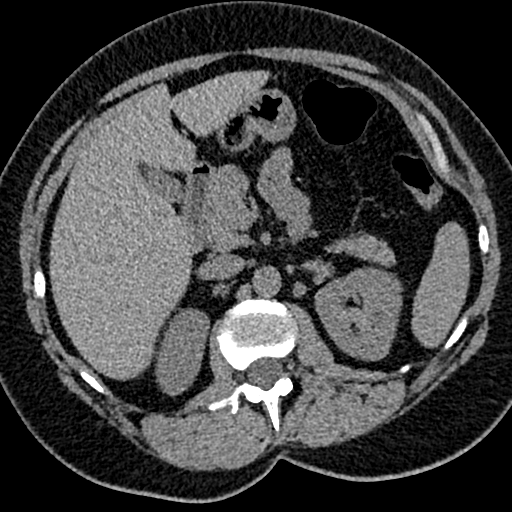
[im 12/155  lung]
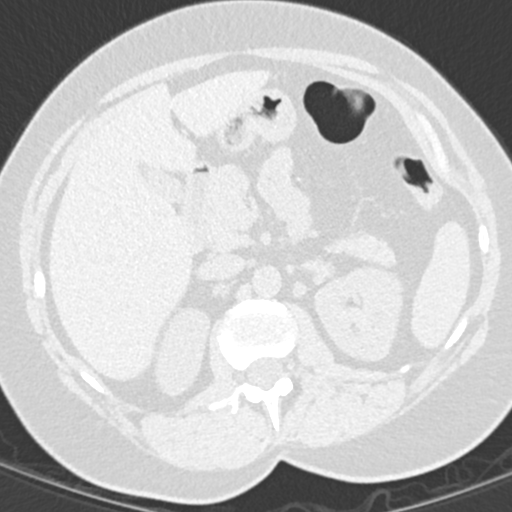
[im 23/155  lung]
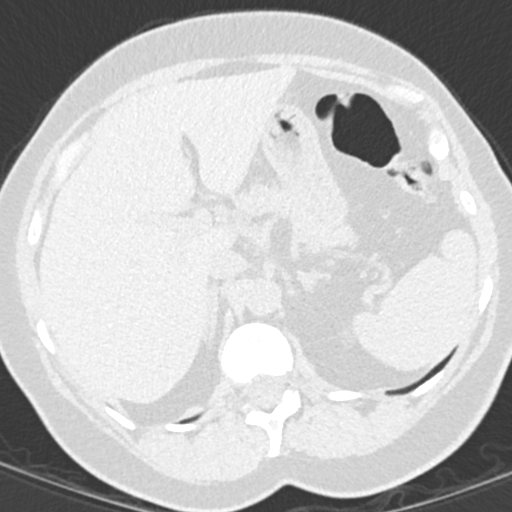
[im 35/155  lung]
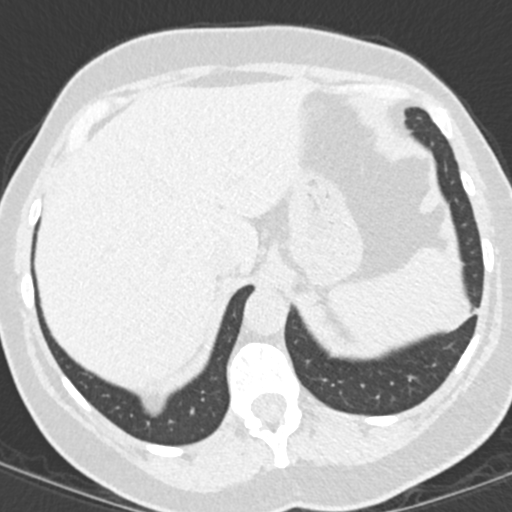
[im 46/155  lung]
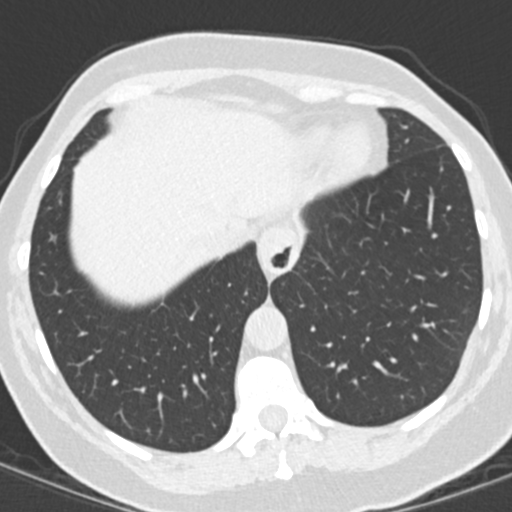
[im 58/155  mediastinal]
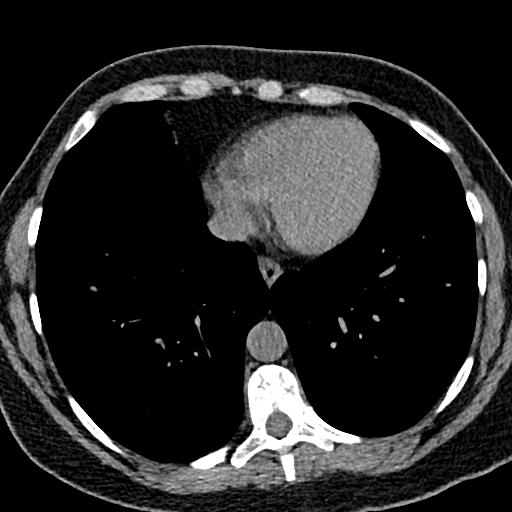
[im 58/155  lung]
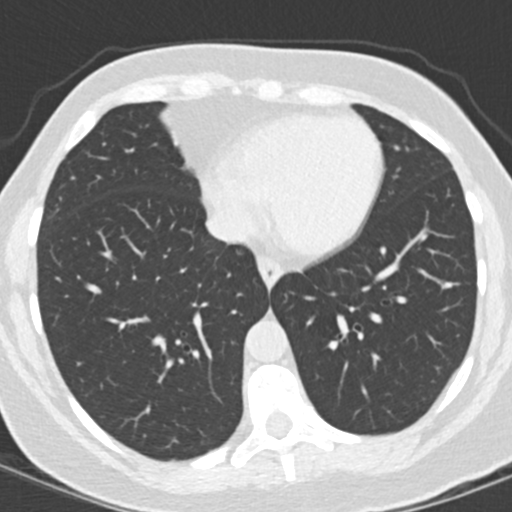
[im 69/155  lung]
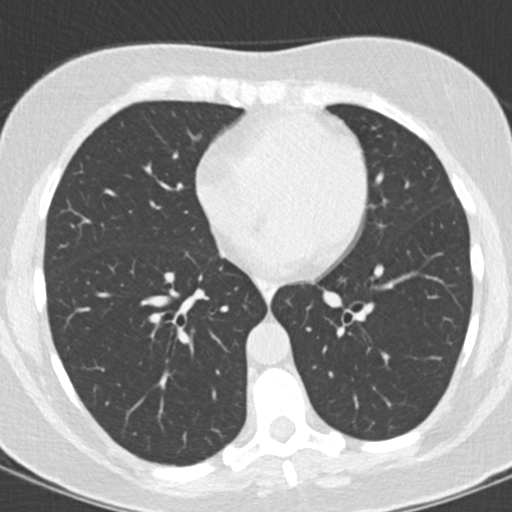
[im 86/155  lung]
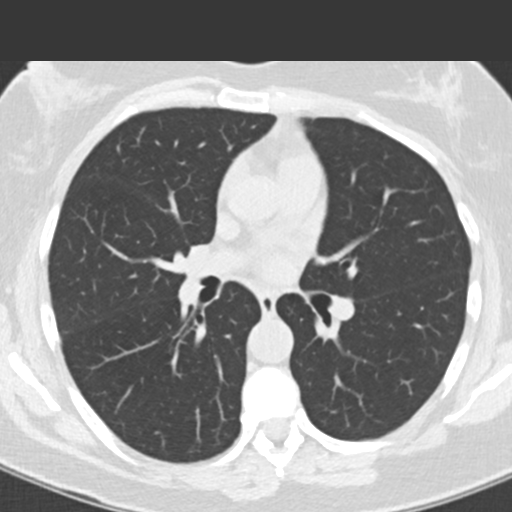
[im 97/155  lung]
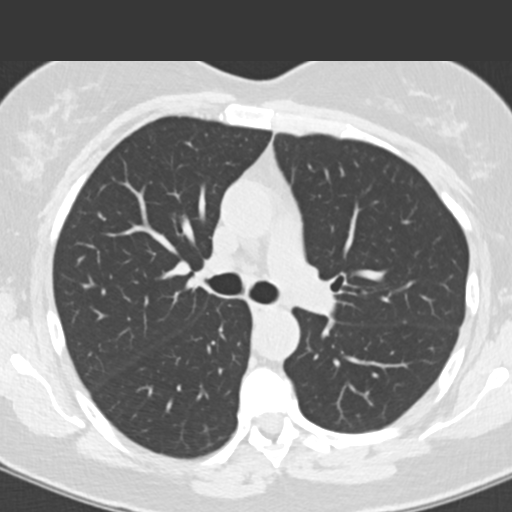
[im 109/155  mediastinal]
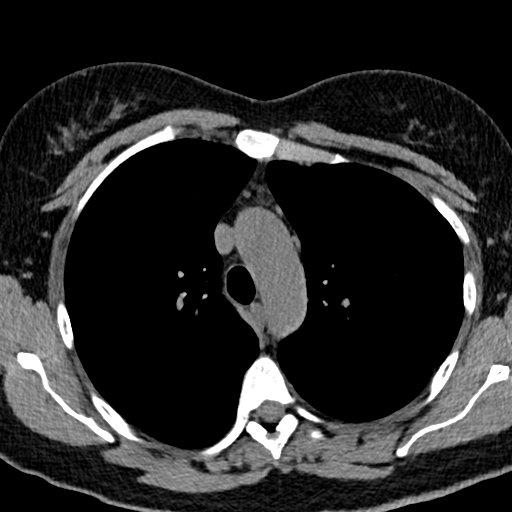
[im 109/155  lung]
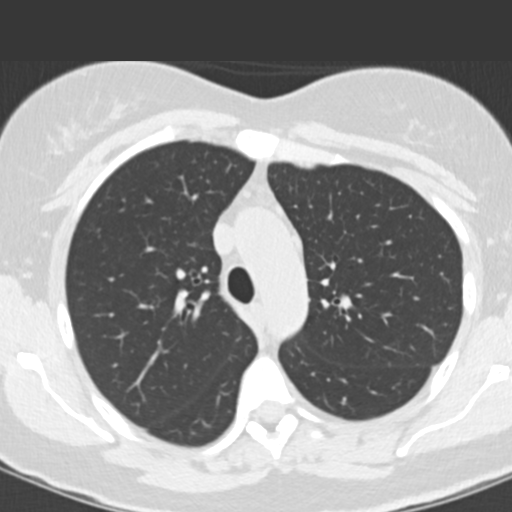
[im 120/155  lung]
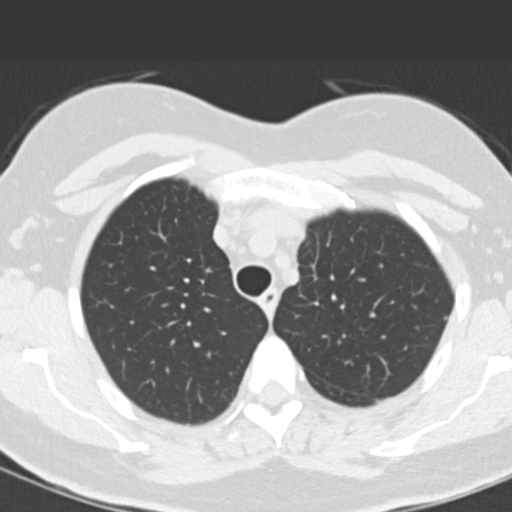
[im 132/155  lung]
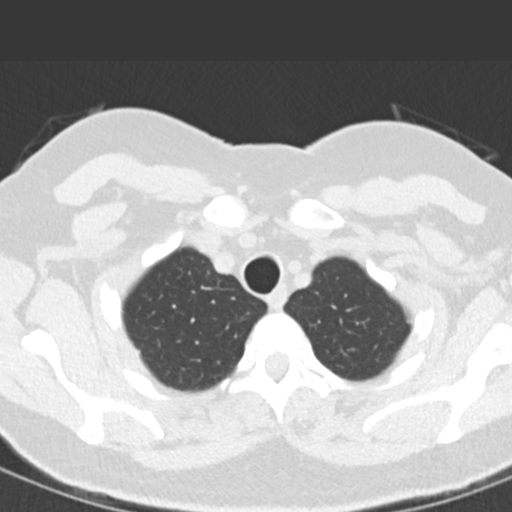
[im 143/155  lung]
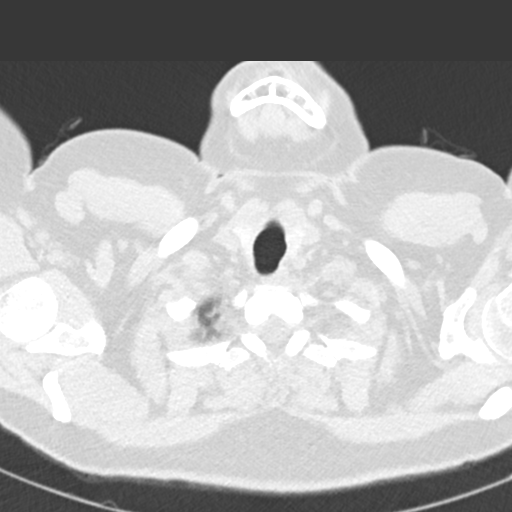

[Series 5: coronal · coronal · 0.61mm/px · 3 of 151 slices shown]
[im 31/151  lung]
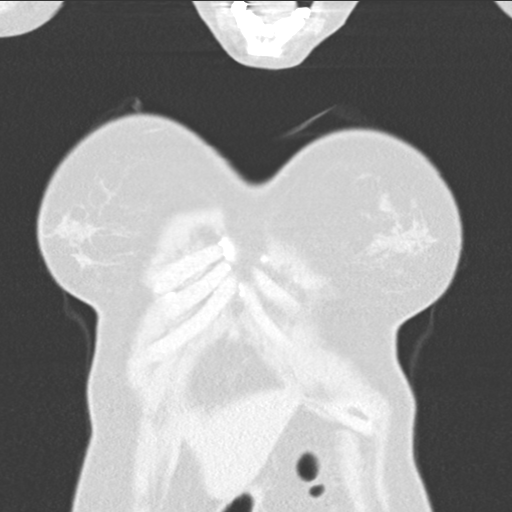
[im 61/151  lung]
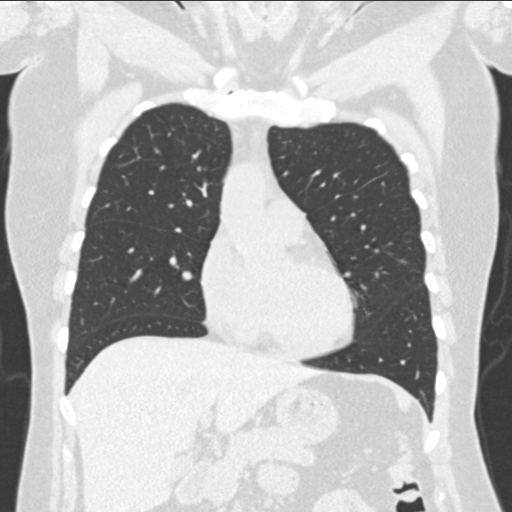
[im 91/151  lung]
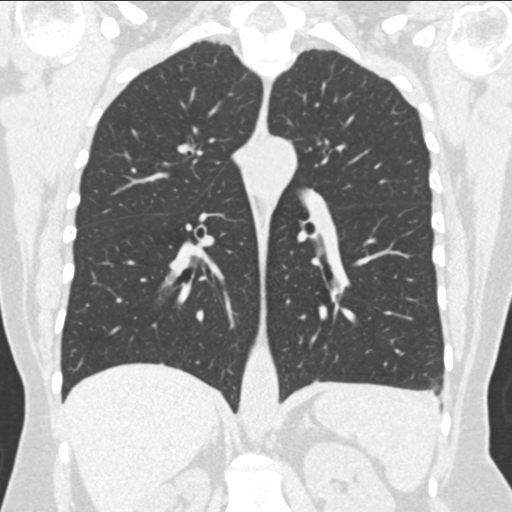

[15 of 36 positions shown; findings below may reference images not displayed]

FINDINGS: Cardiovascular: Vascular structures are unremarkable. Heart size
normal. No pericardial effusion.

Mediastinum/Nodes: Mediastinal and axillary lymph nodes are not
enlarged by CT size criteria. Hilar regions are difficult to
evaluate without IV contrast. Esophagus is grossly unremarkable.
Small hiatal hernia.

Lungs/Pleura: Minimal biapical pleuroparenchymal scarring. 5 mm left
lower lobe nodule (series 4, image 97). Subpleural scarring in the
left lower lobe (series 4, image 118). No pleural fluid. Airway is
unremarkable.

Upper Abdomen: Low-attenuation lesion in the right hepatic lobe
measures 1.6 cm and is difficult to further characterize without
post-contrast imaging but in the absence of known malignancy, a cyst
or hemangioma is likely. Visualized portions of the liver,
gallbladder, adrenal glands and right kidney are unremarkable. Left
renal stone. Subcentimeter low-attenuation lesion in the spleen is
too small to characterize. Visualized portions of the pancreas,
stomach and bowel are unremarkable with exception of a small hiatal
hernia. No upper abdominal adenopathy. Small right Bochdalek hernia
contains fat.

Musculoskeletal: No worrisome lytic or sclerotic lesions.
IMPRESSION: 1. 5 mm left lower lobe nodule. No follow-up needed if patient is
low-risk. Non-contrast chest CT can be considered in 12 months if
patient is high-risk. This recommendation follows the consensus
statement: Guidelines for Management of Incidental Pulmonary Nodules
Detected on CT Images: From the [HOSPITAL] 8579; Radiology
2. Left renal stone.

## 2019-02-13 ENCOUNTER — Encounter: Payer: 59 | Admitting: Family

## 2019-03-02 ENCOUNTER — Encounter: Payer: Self-pay | Admitting: Family Medicine

## 2019-03-02 ENCOUNTER — Ambulatory Visit (INDEPENDENT_AMBULATORY_CARE_PROVIDER_SITE_OTHER): Payer: 59 | Admitting: Family Medicine

## 2019-03-02 ENCOUNTER — Other Ambulatory Visit: Payer: Self-pay

## 2019-03-02 VITALS — BP 127/85 | HR 84 | Temp 97.0°F | Ht 62.0 in | Wt 139.0 lb

## 2019-03-02 DIAGNOSIS — R300 Dysuria: Secondary | ICD-10-CM

## 2019-03-02 DIAGNOSIS — N898 Other specified noninflammatory disorders of vagina: Secondary | ICD-10-CM

## 2019-03-02 DIAGNOSIS — N760 Acute vaginitis: Secondary | ICD-10-CM | POA: Diagnosis not present

## 2019-03-02 DIAGNOSIS — B9689 Other specified bacterial agents as the cause of diseases classified elsewhere: Secondary | ICD-10-CM | POA: Diagnosis not present

## 2019-03-02 LAB — URINALYSIS, COMPLETE
Bilirubin, UA: NEGATIVE
GLUCOSE, UA: NEGATIVE
Ketones, UA: NEGATIVE
Nitrite, UA: NEGATIVE
Protein, UA: NEGATIVE
Specific Gravity, UA: 1.015 (ref 1.005–1.030)
Urobilinogen, Ur: 0.2 mg/dL (ref 0.2–1.0)
pH, UA: 5.5 (ref 5.0–7.5)

## 2019-03-02 LAB — MICROSCOPIC EXAMINATION

## 2019-03-02 LAB — WET PREP FOR TRICH, YEAST, CLUE
Clue Cell Exam: POSITIVE — AB
Trichomonas Exam: NEGATIVE
Yeast Exam: NEGATIVE

## 2019-03-02 MED ORDER — METRONIDAZOLE 500 MG PO TABS: 500.0000 mg | ORAL_TABLET | Freq: Two times a day (BID) | ORAL | 0 refills | Status: DC

## 2019-03-02 NOTE — Patient Instructions (Addendum)
Healthy vaginal hygiene practices   -  Avoid sleeper pajamas. Nightgowns allow air to circulate.  Sleep without underpants whenever possible.  -  Wear cotton underpants during the day. Double-rinse underwear after washing to avoid residual irritants. Do not use fabric softeners for underwear and swimsuits.  - Avoid tights, leotards, leggings, "skinny" jeans, and other tight-fitting clothing. Skirts and loose-fitting pants allow air to circulate.  - Avoid pantyliners.  Instead use tampons or cotton pads.  - Daily warm bathing is helpful:     - Soak in clean water (no soap) for 10 to 15 minutes.     - Use soap to wash regions other than the genital area just before getting out of the tub or drain water and take a shower to wash your body. Limit use of any soap on genital areas. Use   fragance-free soaps.     - Rinse the genital area well and gently pat dry.  Don't rub.  Hair dryer to assist with drying can be used only if on cool setting.     - Do not use bubble baths or perfumed soaps.  - Do not use any feminine sprays, douches or powders.  These contain chemicals that will irritate the skin.  - If the genital area is tender or swollen, cool compresses may relieve the discomfort. Unscented wet wipes can be used instead of toilet paper for wiping.   - Emollients, such as Vaseline, may help protect skin and can be applied to the irritated area.  - Always remember to wipe front-to-back after bowel movements. Pat dry after urination.  - Do not sit in wet swimsuits for long periods of time after swimming   Bacterial Vaginosis  Bacterial vaginosis is an infection of the vagina. It happens when too many normal germs (healthy bacteria) grow in the vagina. This infection puts you at risk for infections from sex (STIs). Treating this infection can lower your risk for some STIs. You should also treat this if you are pregnant. It can cause your baby to be born early. Follow these instructions at  home: Medicines  Take over-the-counter and prescription medicines only as told by your doctor.  Take or use your antibiotic medicine as told by your doctor. Do not stop taking or using it even if you start to feel better. General instructions  If you your sexual partner is a woman, tell her that you have this infection. She needs to get treatment if she has symptoms. If you have a female partner, he does not need to be treated.  During treatment: ? Avoid sex. ? Do not douche. ? Avoid alcohol as told. ? Avoid breastfeeding as told.  Drink enough fluid to keep your pee (urine) clear or pale yellow.  Keep your vagina and butt (rectum) clean. ? Wash the area with warm water every day. ? Wipe from front to back after you use the toilet.  Keep all follow-up visits as told by your doctor. This is important. Preventing this condition  Do not douche.  Use only warm water to wash around your vagina.  Use protection when you have sex. This includes: ? Latex condoms. ? Dental dams.  Limit how many people you have sex with. It is best to only have sex with the same person (be monogamous).  Get tested for STIs. Have your partner get tested.  Wear underwear that is cotton or lined with cotton.  Avoid tight pants and pantyhose. This is most important in summer.  Do not use any products that have nicotine or tobacco in them. These include cigarettes and e-cigarettes. If you need help quitting, ask your doctor.  Do not use illegal drugs.  Limit how much alcohol you drink. Contact a doctor if:  Your symptoms do not get better, even after you are treated.  You have more discharge or pain when you pee (urinate).  You have a fever.  You have pain in your belly (abdomen).  You have pain with sex.  Your bleed from your vagina between periods. Summary  This infection happens when too many germs (bacteria) grow in the vagina.  Treating this condition can lower your risk for some  infections from sex (STIs).  You should also treat this if you are pregnant. It can cause early (premature) birth.  Do not stop taking or using your antibiotic medicine even if you start to feel better. This information is not intended to replace advice given to you by your health care provider. Make sure you discuss any questions you have with your health care provider. Document Released: 09/14/2008 Document Revised: 08/21/2016 Document Reviewed: 08/21/2016 Elsevier Interactive Patient Education  2019 [employer].

## 2019-03-02 NOTE — Progress Notes (Signed)
Subjective:  Patient ID: Sandra Adams, female    DOB: January 30, 1966, 53 y.o.   MRN: [id number]  Chief Complaint:  Urinary Tract Infection (dysuria began yesterday) and Vaginal itching, irritation   HPI: Sandra Adams is a 53 y.o. female presenting on 03/02/2019 for Urinary Tract Infection (dysuria began yesterday) and Vaginal itching, irritation  Pt presents today for vaginal irritation and dysuria. Pt states this started 4 days ago and is getting worse. She denies vaginal bleeding or pain. No dyspareunia or discharge. No confusion, weakness, fever, chills, or abdominal pain. No flank pain. Denies unprotected sex.   Relevant past medical, surgical, family, and social history reviewed and updated as indicated.  Allergies and medications reviewed and updated.   Past Medical History:  Diagnosis Date  . Anxiety   . Depression   . Menopausal symptom   . Migraine   . Scoliosis     Past Surgical History:  Procedure Laterality Date  . KNEE SURGERY Left 1974 and 1979    Social History   Socioeconomic History  . Marital status: Divorced    Spouse name: Not on file  . Number of children: 0  . Years of education: Not on file  . Highest education level: Not on file  Occupational History  . Occupation: [employer]    Comment: Back : Scoliois Knee surgery   Social Needs  . Financial resource strain: Not on file  . Food insecurity:    Worry: Not on file    Inability: Not on file  . Transportation needs:    Medical: Not on file    Non-medical: Not on file  Tobacco Use  . Smoking status: Never Smoker  . Smokeless tobacco: Never Used  Substance and Sexual Activity  . Alcohol use: No  . Drug use: No  . Sexual activity: Not Currently    Partners: Male    Birth control/protection: None  Lifestyle  . Physical activity:    Days per week: Not on file    Minutes per session: Not on file  . Stress: Not on file  Relationships  . Social connections:    Talks on phone: Not on  file    Gets together: Not on file    Attends religious service: Not on file    Active member of club or organization: Not on file    Attends meetings of clubs or organizations: Not on file    Relationship status: Not on file  . Intimate partner violence:    Fear of current or ex partner: Not on file    Emotionally abused: Not on file    Physically abused: Not on file    Forced sexual activity: Not on file  Other Topics Concern  . Not on file  Social History Narrative   Single,no children   Right handed   12 th grade   No caffeine    Outpatient Encounter Medications as of 03/02/2019  Medication Sig  . ALPRAZolam (XANAX) 0.25 MG tablet Take 1 tablet (0.25 mg total) by mouth 2 (two) times daily as needed for anxiety.  . cetirizine (ZYRTEC) 10 MG tablet Take 1 tablet (10 mg total) by mouth daily.  . fluticasone (FLONASE) 50 MCG/ACT nasal spray Place 2 sprays into both nostrils daily.  Marland Kitchen ibuprofen (IBU) 600 MG tablet TAKE 1 TABLET BY MOUTH EVERY 8 HOURS AS NEEDED FOR MILD OR MODERATE PAIN  . meclizine (ANTIVERT) 25 MG tablet Take 1 tablet (25 mg total) by mouth 3 (  three) times daily as needed for dizziness.  . pantoprazole (PROTONIX) 40 MG tablet Take 1 tablet (40 mg total) by mouth daily. For stomach  . Probiotic Product (PRO-BIOTIC BLEND) CAPS Take 1 capsule by mouth daily.  . solifenacin (VESICARE) 5 MG tablet Take 1 tablet (5 mg total) by mouth daily. For bladder spasms.  . metroNIDAZOLE (FLAGYL) 500 MG tablet Take 1 tablet (500 mg total) by mouth 2 (two) times daily.   No facility-administered encounter medications on file as of 03/02/2019.     Allergies  Allergen Reactions  . Ciprofloxacin     Makes her feel weak,dizzy  . Imitrex [Sumatriptan]     Blurred vision   . Prednisone Nausea And Vomiting  . Tylenol [Acetaminophen]     Tylenol #3,Headaches  . Vicodin [Hydrocodone-Acetaminophen]     GI upset    Review of Systems  Constitutional: Negative for chills, fatigue  and fever.  Respiratory: Negative for cough, shortness of breath and wheezing.   Cardiovascular: Negative for chest pain, palpitations and leg swelling.  Gastrointestinal: Negative for abdominal distention, abdominal pain, anal bleeding, blood in stool, constipation, diarrhea, nausea, rectal pain and vomiting.  Genitourinary: Positive for dysuria and vaginal pain. Negative for decreased urine volume, difficulty urinating, dyspareunia, enuresis, flank pain, frequency, genital sores, hematuria, menstrual problem, pelvic pain, urgency, vaginal bleeding and vaginal discharge.  Musculoskeletal: Negative for arthralgias, back pain and joint swelling.  Neurological: Negative for weakness.  Psychiatric/Behavioral: Negative for confusion.  All other systems reviewed and are negative.       Objective:  BP 127/85   Pulse 84   Temp (!) 97 F (36.1 C) (Oral)   Ht 5\' 2"  (1.575 m)   Wt 139 lb (63 kg)   LMP 10/16/2014   BMI 25.42 kg/m    Wt Readings from Last 3 Encounters:  03/02/19 139 lb (63 kg)  12/07/18 142 lb 6.4 oz (64.6 kg)  10/21/18 134 lb (60.8 kg)    Physical Exam Vitals signs and nursing note reviewed.  Constitutional:      General: She is not in acute distress.    Appearance: Normal appearance. She is not ill-appearing or toxic-appearing.  HENT:     Head: Normocephalic and atraumatic.     Mouth/Throat:     Mouth: Mucous membranes are moist.     Pharynx: Oropharynx is clear.  Eyes:     Extraocular Movements: Extraocular movements intact.     Conjunctiva/sclera: Conjunctivae normal.     Pupils: Pupils are equal, round, and reactive to light.  Neck:     Musculoskeletal: Neck supple.  Cardiovascular:     Rate and Rhythm: Normal rate and regular rhythm.     Heart sounds: Normal heart sounds. No murmur. No friction rub. No gallop.   Pulmonary:     Effort: No respiratory distress.     Breath sounds: Normal breath sounds.  Abdominal:     General: Bowel sounds are normal.  There is no distension.     Palpations: Abdomen is soft.     Tenderness: There is no abdominal tenderness. There is no right CVA tenderness or left CVA tenderness.  Genitourinary:    Comments: Pelvic deferred, pt did self wet prep  Skin:    General: Skin is warm and dry.     Capillary Refill: Capillary refill takes less than 2 seconds.  Neurological:     General: No focal deficit present.     Mental Status: She is alert and oriented to person, place,  and time.  Psychiatric:        Mood and Affect: Mood normal.        Behavior: Behavior normal.        Thought Content: Thought content normal.        Judgment: Judgment normal.     Results for orders placed or performed in visit on 12/07/18  WET PREP FOR [place], YEAST, CLUE  Result Value Ref Range   Trichomonas Exam Negative Negative   Yeast Exam Negative Negative   Clue Cell Exam Negative Negative  Urine Culture  Result Value Ref Range   Urine Culture, Routine Final report    Organism ID, Bacteria No growth   Microscopic Examination  Result Value Ref Range   WBC, UA 0-5 0 - 5 /hpf   RBC, UA 3-10 (A) 0 - 2 /hpf   Epithelial Cells (non renal) 0-10 0 - 10 /hpf   Renal Epithel, UA None seen None seen /hpf   Mucus, UA Present Not Estab.   Bacteria, UA Moderate (A) None seen/Few  Urinalysis, Complete  Result Value Ref Range   Specific Gravity, UA 1.025 1.005 - 1.030   pH, UA 5.5 5.0 - 7.5   Color, UA Yellow Yellow   Appearance Ur Clear Clear   Leukocytes, UA 1+ (A) Negative   Protein, UA Negative Negative/Trace   Glucose, UA Negative Negative   Ketones, UA Negative Negative   RBC, UA 1+ (A) Negative   Bilirubin, UA Negative Negative   Urobilinogen, Ur 0.2 0.2 - 1.0 mg/dL   Nitrite, UA Negative Negative   Microscopic Examination See below:        Pertinent labs & imaging results that were available during my care of the patient were reviewed by me and considered in my medical decision making.  Assessment & Plan:   Dajsha was seen today for urinary tract infection and vaginal itching, irritation.  Diagnoses and all orders for this visit:  Dysuria Trace hematuria and 1+ leukocytes on urinalysis in office. Culture pending, will treat if warranted. Increase water intake and avoid bladder irritants.  -     Urinalysis, Complete -     Urine culture -     WET PREP FOR TRICH, YEAST, CLUE  Vaginal irritation -     WET PREP FOR TRICH, YEAST, CLUE  Bacterial vaginosis Wet prep positive for clue cells. Vaginal hygiene discussed. Medications as prescribed. Report any new or worsening symptoms.  -     metroNIDAZOLE (FLAGYL) 500 MG tablet; Take 1 tablet (500 mg total) by mouth 2 (two) times daily.     Continue all other maintenance medications.  Follow up plan: Return in 4 weeks (on 03/30/2019) for with PCP for urine recheck (hematuria).  Educational handout given for vaginal hygiene, bacterial vaginosis   The above assessment and management plan was discussed with the patient. The patient verbalized understanding of and has agreed to the management plan. Patient is aware to call the clinic if symptoms persist or worsen. Patient is aware when to return to the clinic for a follow-up visit. Patient educated on when it is appropriate to go to the emergency department.   Monia Pouch, FNP-C [facility] [phone]

## 2019-03-03 ENCOUNTER — Telehealth: Payer: Self-pay | Admitting: Family

## 2019-03-03 NOTE — Telephone Encounter (Signed)
Patient informed this is normal and she should not be alarmed.  Advised her to take antibiotic and drink plenty of water.

## 2019-03-04 LAB — URINE CULTURE

## 2019-03-23 ENCOUNTER — Telehealth: Payer: Self-pay | Admitting: Family

## 2019-03-23 NOTE — Telephone Encounter (Signed)
Attempted to contact patient. Line busy

## 2019-03-27 NOTE — Telephone Encounter (Signed)
Attempted to contact patient- no call back. This encounter will be closed. 

## 2019-04-04 ENCOUNTER — Other Ambulatory Visit: Payer: Self-pay | Admitting: Family

## 2019-04-04 DIAGNOSIS — E663 Overweight: Secondary | ICD-10-CM

## 2019-04-04 DIAGNOSIS — F1129 Opioid dependence with unspecified opioid-induced disorder: Secondary | ICD-10-CM

## 2019-04-04 DIAGNOSIS — F411 Generalized anxiety disorder: Secondary | ICD-10-CM

## 2019-04-07 ENCOUNTER — Other Ambulatory Visit: Payer: Self-pay | Admitting: Family

## 2019-04-07 DIAGNOSIS — E663 Overweight: Secondary | ICD-10-CM

## 2019-04-07 DIAGNOSIS — F1129 Opioid dependence with unspecified opioid-induced disorder: Secondary | ICD-10-CM

## 2019-04-07 DIAGNOSIS — F411 Generalized anxiety disorder: Secondary | ICD-10-CM

## 2019-04-12 ENCOUNTER — Other Ambulatory Visit: Payer: Self-pay

## 2019-04-12 ENCOUNTER — Encounter: Payer: Self-pay | Admitting: Family Medicine

## 2019-04-12 ENCOUNTER — Ambulatory Visit (INDEPENDENT_AMBULATORY_CARE_PROVIDER_SITE_OTHER): Payer: 59 | Admitting: Family Medicine

## 2019-04-12 DIAGNOSIS — J0110 Acute frontal sinusitis, unspecified: Secondary | ICD-10-CM | POA: Diagnosis not present

## 2019-04-12 NOTE — Progress Notes (Signed)
Virtual Visit via telephone Note  I connected with Sandra Adams on 04/12/19 at 1558 by telephone and verified that I am speaking with the correct person using two identifiers. Sandra Adams is currently located at home and no other people are currently with her during visit. The provider, Fransisca Kaufmann Dettinger, MD is located in their office at time of visit.  Call ended at 1609  I discussed the limitations, risks, security and privacy concerns of performing an evaluation and management service by telephone and the availability of in person appointments. I also discussed with the patient that there may be a patient responsible charge related to this service. The patient expressed understanding and agreed to proceed.   History and Present Illness: Patient has been having sinus congestion that has been going on for a week.  She feels like it is swollen in her nose.  She has been using the flonase and the right side has been opening up some.  She has headaches and ibuprofen has been helping. Patient denies any fevers or chills or shortness of breath or wheezing.  She is using advil sinus and cold.    No diagnosis found.  Outpatient Encounter Medications as of 04/12/2019  Medication Sig  . ALPRAZolam (XANAX) 0.25 MG tablet Take 1 tablet (0.25 mg total) by mouth 2 (two) times daily as needed for anxiety.  . cetirizine (ZYRTEC) 10 MG tablet Take 1 tablet (10 mg total) by mouth daily.  . fluticasone (FLONASE) 50 MCG/ACT nasal spray Place 2 sprays into both nostrils daily.  Marland Kitchen ibuprofen (IBU) 600 MG tablet TAKE 1 TABLET BY MOUTH EVERY 8 HOURS AS NEEDED FOR MILD OR MODERATE PAIN  . meclizine (ANTIVERT) 25 MG tablet Take 1 tablet (25 mg total) by mouth 3 (three) times daily as needed for dizziness.  . metroNIDAZOLE (FLAGYL) 500 MG tablet Take 1 tablet (500 mg total) by mouth 2 (two) times daily.  . pantoprazole (PROTONIX) 40 MG tablet Take 1 tablet (40 mg total) by mouth daily. For stomach  .  Probiotic Product (PRO-BIOTIC BLEND) CAPS Take 1 capsule by mouth daily.  . solifenacin (VESICARE) 5 MG tablet Take 1 tablet (5 mg total) by mouth daily. For bladder spasms.   No facility-administered encounter medications on file as of 04/12/2019.     Review of Systems  Constitutional: Negative for chills and fever.  HENT: Positive for congestion, postnasal drip, rhinorrhea, sinus pressure, sneezing and sore throat. Negative for ear discharge and ear pain.   Eyes: Negative for pain, redness and visual disturbance.  Respiratory: Positive for cough. Negative for chest tightness and shortness of breath.   Cardiovascular: Negative for chest pain and leg swelling.  Genitourinary: Negative for difficulty urinating and dysuria.  Musculoskeletal: Negative for back pain and gait problem.  Skin: Negative for rash.  Neurological: Negative for light-headedness and headaches.  Psychiatric/Behavioral: Negative for agitation and behavioral problems.  All other systems reviewed and are negative.   Observations/Objective: Patient sounds comfortable and in no acute distress   Assessment and Plan: Problem List Items Addressed This Visit    None    Visit Diagnoses    Acute non-recurrent frontal sinusitis    -  Primary   Relevant Orders   MYCHART COVID-19 HOME MONITORING PROGRAM   Temperature monitoring       Follow Up Instructions:  Patient will use saline and ibuprofen and flonase and allergy medications   Recommended quarantine for 7-14 days  I discussed the assessment and treatment  plan with the patient. The patient was provided an opportunity to ask questions and all were answered. The patient agreed with the plan and demonstrated an understanding of the instructions.   The patient was advised to call back or seek an in-person evaluation if the symptoms worsen or if the condition fails to improve as anticipated.  The above assessment and management plan was discussed with the patient.  The patient verbalized understanding of and has agreed to the management plan. Patient is aware to call the clinic if symptoms persist or worsen. Patient is aware when to return to the clinic for a follow-up visit. Patient educated on when it is appropriate to go to the emergency department.    I provided 11 minutes of non-face-to-face time during this encounter.    Worthy Rancher, MD

## 2019-06-04 ENCOUNTER — Encounter: Payer: Self-pay | Admitting: *Deleted

## 2019-06-05 ENCOUNTER — Other Ambulatory Visit: Payer: Self-pay

## 2019-06-05 ENCOUNTER — Ambulatory Visit (INDEPENDENT_AMBULATORY_CARE_PROVIDER_SITE_OTHER): Payer: 59 | Admitting: Family

## 2019-06-05 ENCOUNTER — Telehealth: Payer: Self-pay | Admitting: Family

## 2019-06-05 ENCOUNTER — Other Ambulatory Visit: Payer: 59 | Admitting: Adult Health

## 2019-06-05 ENCOUNTER — Encounter: Payer: Self-pay | Admitting: Family

## 2019-06-05 DIAGNOSIS — F331 Major depressive disorder, recurrent, moderate: Secondary | ICD-10-CM

## 2019-06-05 DIAGNOSIS — E663 Overweight: Secondary | ICD-10-CM | POA: Diagnosis not present

## 2019-06-05 DIAGNOSIS — F411 Generalized anxiety disorder: Secondary | ICD-10-CM | POA: Diagnosis not present

## 2019-06-05 DIAGNOSIS — N951 Menopausal and female climacteric states: Secondary | ICD-10-CM | POA: Diagnosis not present

## 2019-06-05 DIAGNOSIS — Z79899 Other long term (current) drug therapy: Secondary | ICD-10-CM

## 2019-06-05 DIAGNOSIS — F1129 Opioid dependence with unspecified opioid-induced disorder: Secondary | ICD-10-CM

## 2019-06-05 DIAGNOSIS — K219 Gastro-esophageal reflux disease without esophagitis: Secondary | ICD-10-CM | POA: Diagnosis not present

## 2019-06-05 MED ORDER — IBUPROFEN 600 MG PO TABS: ORAL_TABLET | ORAL | 1 refills | Status: DC

## 2019-06-05 MED ORDER — ALPRAZOLAM 0.25 MG PO TABS: 0.2500 mg | ORAL_TABLET | Freq: Two times a day (BID) | ORAL | 5 refills | Status: DC | PRN

## 2019-06-05 MED ORDER — PAROXETINE HCL 10 MG PO TABS: 10.0000 mg | ORAL_TABLET | Freq: Every day | ORAL | 1 refills | Status: DC

## 2019-06-05 MED ORDER — PANTOPRAZOLE SODIUM 40 MG PO TBEC: 40.0000 mg | DELAYED_RELEASE_TABLET | Freq: Every day | ORAL | 11 refills | Status: DC

## 2019-06-05 NOTE — Telephone Encounter (Signed)
appt schedled for telephone visit

## 2019-06-05 NOTE — Progress Notes (Signed)
Virtual Visit via telephone Note  I connected with Sandra Adams on 06/05/19 at 2:02 pm by telephone and verified that I am speaking with the correct person using two identifiers. Sandra Adams is currently located at home and no one is currently with her during visit. The provider, [name], FNP is located in their office at time of visit.  I discussed the limitations, risks, security and privacy concerns of performing an evaluation and management service by telephone and the availability of in person appointments. I also discussed with the patient that there may be a patient responsible charge related to this service. The patient expressed understanding and agreed to proceed.   History and Present Illness:  Pt calls the office today for chronic follow up. PT complaining of hot flashes every night. States she wakes up "soaking wet" and can't seem sleep good.  Anxiety Presents for follow-up visit. Symptoms include decreased concentration, depressed mood, excessive worry, insomnia, irritability, nervous/anxious behavior and restlessness. The severity of symptoms is moderate. The quality of sleep is good.    Depression        This is a chronic problem.  The current episode started more than 1 year ago.   The onset quality is gradual.   The problem occurs intermittently.  The problem has been waxing and waning since onset.  Associated symptoms include decreased concentration, insomnia, irritable, restlessness, decreased interest and sad.  Past treatments include nothing.  Past medical history includes anxiety.   Gastroesophageal Reflux She complains of heartburn. She reports no belching, no coughing or no hoarse voice. This is a chronic problem. The current episode started more than 1 year ago. The problem occurs occasionally. The problem has been waxing and waning. She has tried a PPI for the symptoms. The treatment provided moderate relief.      Review of Systems  Constitutional:  Positive for irritability.  HENT: Negative for hoarse voice.   Respiratory: Negative for cough.   Gastrointestinal: Positive for heartburn.  Psychiatric/Behavioral: Positive for decreased concentration and depression. The patient is nervous/anxious and has insomnia.   All other systems reviewed and are negative.    Observations/Objective: No SOB or distress noted  Assessment and Plan: Sandra Adams comes in today with chief complaint of No chief complaint on file.   Diagnosis and orders addressed:  1. Gastroesophageal reflux disease without esophagitis - pantoprazole (PROTONIX) 40 MG tablet; Take 1 tablet (40 mg total) by mouth daily. For stomach  Dispense: 30 tablet; Refill: 11  2. Overweight (BMI 25.0-29.9) - ALPRAZolam (XANAX) 0.25 MG tablet; Take 1 tablet (0.25 mg total) by mouth 2 (two) times daily as needed for anxiety.  Dispense: 60 tablet; Refill: 5  3. Menopausal hot flushes Will start Paxil today to help with hot flashes and anxiety  - PARoxetine (PAXIL) 10 MG tablet; Take 1 tablet (10 mg total) by mouth daily.  Dispense: 90 tablet; Refill: 1  4. GAD (generalized anxiety disorder) - ALPRAZolam (XANAX) 0.25 MG tablet; Take 1 tablet (0.25 mg total) by mouth 2 (two) times daily as needed for anxiety.  Dispense: 60 tablet; Refill: 5 - PARoxetine (PAXIL) 10 MG tablet; Take 1 tablet (10 mg total) by mouth daily.  Dispense: 90 tablet; Refill: 1  5. Moderate episode of recurrent major depressive disorder (HCC) - PARoxetine (PAXIL) 10 MG tablet; Take 1 tablet (10 mg total) by mouth daily.  Dispense: 90 tablet; Refill: 1  6. Opioid dependence with opioid-induced disorder ([place]) -Pt reviewed in [place]  controlled database -Drug screen up to date - ALPRAZolam (XANAX) 0.25 MG tablet; Take 1 tablet (0.25 mg total) by mouth 2 (two) times daily as needed for anxiety.  Dispense: 60 tablet; Refill: 5  7. Controlled substance agreement signed   Health Maintenance reviewed Diet and  exercise encouraged  Follow up plan: 6 months      I discussed the assessment and treatment plan with the patient. The patient was provided an opportunity to ask questions and all were answered. The patient agreed with the plan and demonstrated an understanding of the instructions.   The patient was advised to call back or seek an in-person evaluation if the symptoms worsen or if the condition fails to improve as anticipated.  The above assessment and management plan was discussed with the patient. The patient verbalized understanding of and has agreed to the management plan. Patient is aware to call the clinic if symptoms persist or worsen. Patient is aware when to return to the clinic for a follow-up visit. Patient educated on when it is appropriate to go to the emergency department.   Time call ended:  2:20 pm  I provided 18 minutes of non-face-to-face time during this encounter.    [name], FNP

## 2019-06-13 ENCOUNTER — Other Ambulatory Visit: Payer: Self-pay

## 2019-06-13 ENCOUNTER — Ambulatory Visit: Payer: 59 | Admitting: Family Medicine

## 2019-06-13 NOTE — Progress Notes (Signed)
Patient did not answer after a couple of attempts.  She had called earlier but then will be called back she would not answer.

## 2019-06-14 ENCOUNTER — Other Ambulatory Visit: Payer: Self-pay

## 2019-06-14 ENCOUNTER — Ambulatory Visit (INDEPENDENT_AMBULATORY_CARE_PROVIDER_SITE_OTHER): Payer: 59 | Admitting: Family

## 2019-06-14 ENCOUNTER — Encounter: Payer: Self-pay | Admitting: Family

## 2019-06-14 ENCOUNTER — Telehealth: Payer: Self-pay | Admitting: Family

## 2019-06-14 DIAGNOSIS — R300 Dysuria: Secondary | ICD-10-CM | POA: Diagnosis not present

## 2019-06-14 NOTE — Progress Notes (Signed)
   Virtual Visit via telephone Note  I connected with Sandra Adams on 06/14/19 at 4:45 pm by telephone and verified that I am speaking with the correct person using two identifiers. Sandra Adams is currently located at home and no one is currently with her during visit. The provider, [name], FNP is located in their office at time of visit.  I discussed the limitations, risks, security and privacy concerns of performing an evaluation and management service by telephone and the availability of in person appointments. I also discussed with the patient that there may be a patient responsible charge related to this service. The patient expressed understanding and agreed to proceed.   History and Present Illness:  Dysuria  This is a recurrent problem. The current episode started in the past 7 days. The problem occurs intermittently. The problem has been waxing and waning. The quality of the pain is described as burning. The pain is at a severity of 2/10. The pain is mild. Associated symptoms include frequency, nausea and urgency. Pertinent negatives include no hematuria or vomiting. She has tried increased fluids for the symptoms. The treatment provided mild relief.      Review of Systems  Gastrointestinal: Positive for nausea. Negative for vomiting.  Genitourinary: Positive for dysuria, frequency and urgency. Negative for hematuria.  All other systems reviewed and are negative.    Observations/Objective: No SOB or distress   Assessment and Plan: 1. Dysuria Force fluids AZO over the counter X2 days She will come in tomorrow to leave sample, will give antibiotic accordingly  Culture pending - Urinalysis, Complete - Urine Culture - WET PREP FOR TRICH, YEAST, CLUE     I discussed the assessment and treatment plan with the patient. The patient was provided an opportunity to ask questions and all were answered. The patient agreed with the plan and demonstrated an  understanding of the instructions.   The patient was advised to call back or seek an in-person evaluation if the symptoms worsen or if the condition fails to improve as anticipated.  The above assessment and management plan was discussed with the patient. The patient verbalized understanding of and has agreed to the management plan. Patient is aware to call the clinic if symptoms persist or worsen. Patient is aware when to return to the clinic for a follow-up visit. Patient educated on when it is appropriate to go to the emergency department.   Time call ended: 4:58 pm    I provided 13 minutes of non-face-to-face time during this encounter.    [name], FNP

## 2019-06-15 ENCOUNTER — Other Ambulatory Visit: Payer: 59

## 2019-06-15 ENCOUNTER — Other Ambulatory Visit: Payer: Self-pay

## 2019-06-15 LAB — URINALYSIS, COMPLETE
Bilirubin, UA: NEGATIVE
Glucose, UA: NEGATIVE
Ketones, UA: NEGATIVE
Nitrite, UA: NEGATIVE
Protein,UA: NEGATIVE
Specific Gravity, UA: >1.03 — ABNORMAL HIGH (ref 1.005–1.030)
Urobilinogen, Ur: 0.2 mg/dL (ref 0.2–1.0)
pH, UA: 5.5 (ref 5.0–7.5)

## 2019-06-15 LAB — MICROSCOPIC EXAMINATION

## 2019-06-15 LAB — WET PREP FOR TRICH, YEAST, CLUE
Clue Cell Exam: NEGATIVE
Trichomonas Exam: NEGATIVE
Yeast Exam: NEGATIVE

## 2019-06-17 LAB — URINE CULTURE

## 2019-06-19 ENCOUNTER — Other Ambulatory Visit: Payer: Self-pay | Admitting: Family

## 2019-06-19 MED ORDER — CEPHALEXIN 500 MG PO CAPS: 500.0000 mg | ORAL_CAPSULE | Freq: Two times a day (BID) | ORAL | 0 refills | Status: DC

## 2019-06-20 ENCOUNTER — Ambulatory Visit: Payer: 59 | Admitting: Family

## 2019-06-21 ENCOUNTER — Other Ambulatory Visit: Payer: Self-pay | Admitting: Family Medicine

## 2019-06-21 DIAGNOSIS — J020 Streptococcal pharyngitis: Secondary | ICD-10-CM

## 2019-06-26 ENCOUNTER — Telehealth: Payer: Self-pay | Admitting: Family

## 2019-06-26 ENCOUNTER — Other Ambulatory Visit: Payer: Self-pay

## 2019-06-26 ENCOUNTER — Encounter: Payer: 59 | Admitting: Family

## 2019-06-26 NOTE — Telephone Encounter (Signed)
Please advise 

## 2019-06-28 NOTE — Telephone Encounter (Signed)
Patient aware.

## 2019-06-28 NOTE — Telephone Encounter (Signed)
Patient needs to be seen for this

## 2019-07-05 ENCOUNTER — Encounter: Payer: 59 | Admitting: *Deleted

## 2019-07-05 NOTE — Progress Notes (Signed)
This encounter was created in error - please disregard.

## 2019-07-23 ENCOUNTER — Encounter: Payer: Self-pay | Admitting: Family Medicine

## 2019-07-23 ENCOUNTER — Ambulatory Visit (INDEPENDENT_AMBULATORY_CARE_PROVIDER_SITE_OTHER): Payer: 59 | Admitting: Family Medicine

## 2019-07-23 ENCOUNTER — Other Ambulatory Visit: Payer: Self-pay

## 2019-07-23 DIAGNOSIS — N3090 Cystitis, unspecified without hematuria: Secondary | ICD-10-CM

## 2019-07-23 DIAGNOSIS — R300 Dysuria: Secondary | ICD-10-CM | POA: Diagnosis not present

## 2019-07-23 MED ORDER — SULFAMETHOXAZOLE-TRIMETHOPRIM 800-160 MG PO TABS: 1.0000 | ORAL_TABLET | Freq: Two times a day (BID) | ORAL | 0 refills | Status: AC

## 2019-07-23 NOTE — Progress Notes (Signed)
Virtual Visit via telephone Note Due to COVID-19 pandemic this visit was conducted virtually. This visit type was conducted due to national recommendations for restrictions regarding the COVID-19 Pandemic (e.g. social distancing, sheltering in place) in an effort to limit this patient's exposure and mitigate transmission in our community. All issues noted in this document were discussed and addressed.  A physical exam was not performed with this format.   I connected with Sandra Adams on 07/23/19 at 1345 by telephone and verified that I am speaking with the correct person using two identifiers. Sandra Adams is currently located at home and family is currently with them during visit. The provider, Monia Pouch, FNP is located in their office at time of visit.  I discussed the limitations, risks, security and privacy concerns of performing an evaluation and management service by telephone and the availability of in person appointments. I also discussed with the patient that there may be a patient responsible charge related to this service. The patient expressed understanding and agreed to proceed.  Subjective:  Patient ID: Sandra Adams, female    DOB: [birth year], 53 y.o.   MRN: [id number]  Chief Complaint:  Dysuria   HPI: Sandra Adams is a 53 y.o. female presenting on 07/23/2019 for Dysuria   Pt reports two days of dark, cloudy urine and dysuria. No fever, chills, weakness, fatigue, confusion, or flank pain.   Dysuria  This is a new problem. The current episode started yesterday. The problem occurs every urination. The problem has been unchanged. The quality of the pain is described as burning. The pain is at a severity of 2/10. The pain is mild. There has been no fever. There is no history of pyelonephritis. Associated symptoms include frequency and urgency. Pertinent negatives include no chills, discharge, flank pain, hematuria, hesitancy, nausea, possible pregnancy, sweats or  vomiting. She has tried nothing for the symptoms.     Relevant past medical, surgical, family, and social history reviewed and updated as indicated.  Allergies and medications reviewed and updated.   Past Medical History:  Diagnosis Date  . Anxiety   . Depression   . Menopausal symptom   . Migraine   . Scoliosis     Past Surgical History:  Procedure Laterality Date  . KNEE SURGERY Left 1974 and 1979    Social History   Socioeconomic History  . Marital status: Divorced    Spouse name: Not on file  . Number of children: 0  . Years of education: Not on file  . Highest education level: Not on file  Occupational History  . Occupation: Diability    Comment: Back : [employer]ol[employer] Knee surgery   Social Needs  . Financial resource strain: Not on file  . Food insecurity    Worry: Not on file    Inability: Not on file  . Transportation needs    Medical: Not on file    Non-medical: Not on file  Tobacco Use  . Smoking status: Never Smoker  . Smokeless tobacco: Never Used  Substance and Sexual Activity  . Alcohol use: No  . Drug use: No  . Sexual activity: Not Currently    Partners: Male    Birth control/protection: None  Lifestyle  . Physical activity    Days per week: Not on file    Minutes per session: Not on file  . Stress: Not on file  Relationships  . Social connections    Talks on phone: Not on file  Gets together: Not on file    Attends religious service: Not on file    Active member of club or organization: Not on file    Attends meetings of clubs or organizations: Not on file    Relationship status: Not on file  . Intimate partner violence    Fear of current or ex partner: Not on file    Emotionally abused: Not on file    Physically abused: Not on file    Forced sexual activity: Not on file  Other Topics Concern  . Not on file  Social History Narrative   Single,no children   Right handed   12 th grade   No caffeine    Outpatient Encounter  Medications as of 07/23/2019  Medication Sig  . ALPRAZolam (XANAX) 0.25 MG tablet Take 1 tablet (0.25 mg total) by mouth 2 (two) times daily as needed for anxiety.  . cetirizine (ZYRTEC) 10 MG tablet Take 1 tablet (10 mg total) by mouth daily.  . fluticasone (FLONASE) 50 MCG/ACT nasal spray SPRAY 2 SPRAYS INTO EACH NOSTRIL EVERY DAY  . ibuprofen (IBU) 600 MG tablet TAKE 1 TABLET BY MOUTH EVERY 8 HOURS AS NEEDED FOR MILD OR MODERATE PAIN  . meclizine (ANTIVERT) 25 MG tablet Take 1 tablet (25 mg total) by mouth 3 (three) times daily as needed for dizziness.  . pantoprazole (PROTONIX) 40 MG tablet Take 1 tablet (40 mg total) by mouth daily. For stomach  . PARoxetine (PAXIL) 10 MG tablet Take 1 tablet (10 mg total) by mouth daily.  . Probiotic Product (PRO-BIOTIC BLEND) CAPS Take 1 capsule by mouth daily.  Marland Kitchen sulfamethoxazole-trimethoprim (BACTRIM DS) 800-160 MG tablet Take 1 tablet by mouth 2 (two) times daily for 7 days.  . [DISCONTINUED] cephALEXin (KEFLEX) 500 MG capsule Take 1 capsule (500 mg total) by mouth 2 (two) times daily.   No facility-administered encounter medications on file as of 07/23/2019.     Allergies  Allergen Reactions  . Ciprofloxacin     Makes her feel weak,dizzy  . Imitrex [Sumatriptan]     Blurred vision   . Prednisone Nausea And Vomiting  . Tylenol [Acetaminophen]     Tylenol #3,Headaches  . Vicodin [Hydrocodone-Acetaminophen]     GI upset    Review of Systems  Constitutional: Negative for activity change, appetite change, chills, diaphoresis, fatigue, fever and unexpected weight change.  Respiratory: Negative for cough and shortness of breath.   Cardiovascular: Negative for chest pain and palpitations.  Gastrointestinal: Negative for abdominal pain, nausea and vomiting.  Genitourinary: Positive for dysuria, frequency and urgency. Negative for decreased urine volume, difficulty urinating, dyspareunia, enuresis, flank pain, hematuria, hesitancy, pelvic pain,  vaginal bleeding, vaginal discharge and vaginal pain.  Musculoskeletal: Negative for arthralgias, back pain and myalgias.  Neurological: Negative for dizziness, syncope, weakness, light-headedness and headaches.  Psychiatric/Behavioral: Negative for confusion.  All other systems reviewed and are negative.        Observations/Objective: No vital signs or physical exam, this was a telephone or virtual health encounter.  Pt alert and oriented, answers all questions appropriately, and able to speak in full sentences.    Assessment and Plan: Brandelyn was seen today for dysuria.  Diagnoses and all orders for this visit:  Dysuria Cystitis Reported symptoms consistent with acute cystitis. Increase water intake. Avoid bladder irritants. Avoid baths, take showers. Previous culture reviewed and prescribed antibiotic based on these results. Report any new or worsening symptoms. Medications as prescribed. Follow up for urine recheck in 2  weeks.  -     sulfamethoxazole-trimethoprim (BACTRIM DS) 800-160 MG tablet; Take 1 tablet by mouth 2 (two) times daily for 7 days.     Follow Up Instructions: Return in about 2 weeks (around 08/06/2019), or if symptoms worsen or fail to improve, for urine recheck .    I discussed the assessment and treatment plan with the patient. The patient was provided an opportunity to ask questions and all were answered. The patient agreed with the plan and demonstrated an understanding of the instructions.   The patient was advised to call back or seek an in-person evaluation if the symptoms worsen or if the condition fails to improve as anticipated.  The above assessment and management plan was discussed with the patient. The patient verbalized understanding of and has agreed to the management plan. Patient is aware to call the clinic if symptoms persist or worsen. Patient is aware when to return to the clinic for a follow-up visit. Patient educated on when it is  appropriate to go to the emergency department.    I provided 15 minutes of non-face-to-face time during this encounter. The call started at 1345. The call ended at 1400. The other time was used for coordination of care.    Monia Pouch, FNP-C [facility] [address] [place],  [address] [phone] 07/23/19

## 2019-07-25 ENCOUNTER — Telehealth: Payer: Self-pay | Admitting: Family

## 2019-07-25 NOTE — Telephone Encounter (Signed)
I have that she is on Bactrim (sulfa) not keflex.  Please ask, send to provider tomorrow.

## 2019-07-26 ENCOUNTER — Encounter: Payer: 59 | Admitting: Family

## 2019-07-26 MED ORDER — ONDANSETRON HCL 4 MG PO TABS: 4.0000 mg | ORAL_TABLET | Freq: Three times a day (TID) | ORAL | 0 refills | Status: DC | PRN

## 2019-07-26 NOTE — Telephone Encounter (Signed)
Patient states that she is taking bactrim and believes this medication is giving her headaches and nausea.  Patient would like for medication to be switched

## 2019-07-26 NOTE — Addendum Note (Signed)
Addended by: [name] A on: 07/26/2019 12:08 PM   Modules accepted: Orders

## 2019-07-26 NOTE — Telephone Encounter (Signed)
Make sure she is drinking lots of fluid and I have sent in zofran Prescription sent to pharmacy.

## 2019-07-26 NOTE — Telephone Encounter (Signed)
Left message for patient to call to confirm voice mail.    Please get nausea medication from pharmacy to aid with sick feeling and also drink lots of fluids.

## 2019-07-30 NOTE — Telephone Encounter (Signed)
No call back from patient after leaving messages.

## 2019-08-16 ENCOUNTER — Ambulatory Visit (INDEPENDENT_AMBULATORY_CARE_PROVIDER_SITE_OTHER): Payer: 59 | Admitting: *Deleted

## 2019-08-16 ENCOUNTER — Encounter: Payer: Self-pay | Admitting: *Deleted

## 2019-08-16 DIAGNOSIS — Z0000 Encounter for general adult medical examination without abnormal findings: Secondary | ICD-10-CM

## 2019-08-16 NOTE — Progress Notes (Addendum)
MEDICARE ANNUAL WELLNESS VISIT  08/16/2019  Telephone Visit Disclaimer This Medicare AWV was conducted by telephone due to national recommendations for restrictions regarding the COVID-19 Pandemic (e.g. social distancing).  I verified, using two identifiers, that I am speaking with Sandra Adams or their authorized healthcare agent. I discussed the limitations, risks, security, and privacy concerns of performing an evaluation and management service by telephone and the potential availability of an in-person appointment in the future. The patient expressed understanding and agreed to proceed.   Subjective:  Sandra Adams is a 53 y.o. female patient of Hawks, Theador Hawthorne, FNP who had a Medicare Annual Wellness Visit today via telephone. Ashanae is Disabled and lives alone. She does not have any children. She reports that she is socially active and does interact with friends/family regularly. She is minimally physically active and enjoys waling, riding bike, and swimming.  Patient Care Team: Sharion Balloon, FNP as PCP - General (Nurse Practitioner) Jonnie Kind, MD as Consulting Physician (Obstetrics and Gynecology)  Advanced Directives 08/16/2019 04/22/2017 06/14/2016 05/09/2015  Does Patient Have a Medical Advance Directive? No No No No  Would patient like information on creating a medical advance directive? No - Patient declined Yes (MAU/Ambulatory/Procedural Areas - Information given) - No - patient declined information    Hospital Utilization Over the Past 12 Months: # of hospitalizations or ER visits: 1 # of surgeries: 0  Review of Systems    Patient reports that her overall health is unchanged compared to last year.  History obtained from chart review and the patient  Patient Reported Readings (BP, Pulse, CBG, Weight, etc) none  Pain Assessment       Current Medications & Allergies (verified) Allergies as of 08/16/2019      Reactions   Ciprofloxacin    Makes her  feel weak,dizzy   Imitrex [sumatriptan]    Blurred vision   Prednisone Nausea And Vomiting   Tylenol [acetaminophen]    Tylenol #3,Headaches   Vicodin [hydrocodone-acetaminophen]    GI upset      Medication List       Accurate as of August 16, 2019  2:55 PM. If you have any questions, ask your nurse or doctor.        ALPRAZolam 0.25 MG tablet Commonly known as: XANAX Take 1 tablet (0.25 mg total) by mouth 2 (two) times daily as needed for anxiety.   cetirizine 10 MG tablet Commonly known as: ZYRTEC Take 1 tablet (10 mg total) by mouth daily.   fluticasone 50 MCG/ACT nasal spray Commonly known as: FLONASE SPRAY 2 SPRAYS INTO EACH NOSTRIL EVERY DAY   ibuprofen 600 MG tablet Commonly known as: IBU TAKE 1 TABLET BY MOUTH EVERY 8 HOURS AS NEEDED FOR MILD OR MODERATE PAIN   meclizine 25 MG tablet Commonly known as: ANTIVERT Take 1 tablet (25 mg total) by mouth 3 (three) times daily as needed for dizziness.   ondansetron 4 MG tablet Commonly known as: Zofran Take 1 tablet (4 mg total) by mouth every 8 (eight) hours as needed for nausea or vomiting.   pantoprazole 40 MG tablet Commonly known as: PROTONIX Take 1 tablet (40 mg total) by mouth daily. For stomach   PARoxetine 10 MG tablet Commonly known as: Paxil Take 1 tablet (10 mg total) by mouth daily.   Pro-biotic Blend Caps Take 1 capsule by mouth daily.       History (reviewed): Past Medical History:  Diagnosis Date  . Anxiety   .  Arthritis   . Depression   . Menopausal symptom   . Migraine   . Scoliosis    Past Surgical History:  Procedure Laterality Date  . KNEE SURGERY Left 1974 and 1979   Family History  Problem Relation Age of Onset  . Epilepsy Mother   . COPD Father   . Hyperlipidemia Father   . Heart disease Father   . Cancer Father   . COPD Sister   . Anxiety disorder Sister   . COPD Maternal Aunt   . Diabetes Maternal Uncle   . Diabetes Maternal Aunt        x2  . Colon cancer Neg  Hx   . Colon polyps Neg Hx   . Esophageal cancer Neg Hx   . Gallbladder disease Neg Hx    Social History   Socioeconomic History  . Marital status: Divorced    Spouse name: Not on file  . Number of children: 0  . Years of education: 12th  . Highest education level: High school graduate  Occupational History  . Occupation: Diability    Comment: Back : Scol[employer] Knee surgery   Social Needs  . Financial resource strain: Not very hard  . Food insecurity    Worry: Never true    Inability: Never true  . Transportation needs    Medical: No    Non-medical: No  Tobacco Use  . Smoking status: Never Smoker  . Smokeless tobacco: Never Used  Substance and Sexual Activity  . Alcohol use: No  . Drug use: No  . Sexual activity: Not Currently    Partners: Male    Birth control/protection: None  Lifestyle  . Physical activity    Days per week: 2 days    Minutes per session: 20 min  . Stress: Only a little  Relationships  . Social Herbalist on phone: More than three times a week    Gets together: Not on file    Attends religious service: More than 4 times per year    Active member of club or organization: No    Attends meetings of clubs or organizations: Never    Relationship status: Divorced  Other Topics Concern  . Not on file  Social History Narrative   Single,no children   Right handed   12 th grade   No caffeine    Activities of Daily Living In your present state of health, do you have any difficulty performing the following activities: 08/16/2019  Hearing? N  Vision? N  Difficulty concentrating or making decisions? N  Walking or climbing stairs? Y  Comment due to knee pain  Dressing or bathing? N  Doing errands, shopping? N  Preparing Food and eating ? N  Using the Toilet? N  In the past six months, have you accidently leaked urine? N  Do you have problems with loss of bowel control? N  Managing your Medications? N  Managing your Finances? N   Housekeeping or managing your Housekeeping? N  Some recent data might be hidden    Patient Education/ Literacy    Exercise Current Exercise Habits: Home exercise routine, Type of exercise: walking, Time (Minutes): 20, Frequency (Times/Week): 2, Weekly Exercise (Minutes/Week): 40, Intensity: Mild, Exercise limited by: orthopedic condition(s)  Diet Patient reports consuming 2 meals a day and 2 snack(s) a day Patient reports that her primary diet is: Regular Patient reports that she does have regular access to food.   Depression Screen PHQ 2/9 Scores 08/16/2019  03/02/2019 12/07/2018 09/13/2018 07/24/2018 06/27/2018 06/24/2018  PHQ - 2 Score 1 1 0 1 2 2  0  PHQ- 9 Score - - - - 5 7 -     Fall Risk Fall Risk  08/16/2019 12/07/2018 06/24/2018 06/07/2018 10/17/2017  Falls in the past year? 0 0 No No No  Number falls in past yr: - - - - -  Injury with Fall? - - - - -  Comment - - - - -  Risk Factor Category  - - - - -  Risk for fall due to : - - - - -  Follow up - - - - -     Objective:  Sandra Adams seemed alert and oriented and she participated appropriately during our telephone visit.  Blood Pressure Weight BMI  BP Readings from Last 3 Encounters:  03/02/19 127/85  12/07/18 130/82  10/21/18 113/66   Wt Readings from Last 3 Encounters:  03/02/19 139 lb (63 kg)  12/07/18 142 lb 6.4 oz (64.6 kg)  10/21/18 134 lb (60.8 kg)   BMI Readings from Last 1 Encounters:  03/02/19 25.42 kg/m    *Unable to obtain current vital signs, weight, and BMI due to telephone visit type  Hearing/Vision  . Tamsin did not seem to have difficulty with hearing/understanding during the telephone conversation . Reports that she has had a formal eye exam by an eye care professional within the past year . Reports that she has not had a formal hearing evaluation within the past year *Unable to fully assess hearing and vision during telephone visit type  Cognitive Function: 6CIT Screen 08/16/2019  What  Year? 0 points  What month? 0 points  What time? 0 points  Count back from 20 0 points  Months in reverse 0 points  Repeat phrase 0 points  Total Score 0   (Normal:0-7, Significant for Dysfunction: >8)  Normal Cognitive Function Screening: Yes   Immunization & Health Maintenance Record Immunization History  Administered Date(s) Administered  . Tdap 12/21/2007    Health Maintenance  Topic Date Due  . TETANUS/TDAP  12/20/2017  . Fecal DNA (Cologuard)  01/17/2018  . DEXA SCAN  05/10/2019  . INFLUENZA VACCINE  07/21/2019  . COLON CANCER SCREENING ANNUAL FOBT  08/20/2019  . PAP SMEAR-Modifier  01/29/2020  . MAMMOGRAM  06/17/2021  . Hepatitis C Screening  Completed  . HIV Screening  Completed       Assessment  This is a routine wellness examination for [employer].  Health Maintenance: Due or Overdue Health Maintenance Due  Topic Date Due  . TETANUS/TDAP  12/20/2017  . Fecal DNA (Cologuard)  01/17/2018  . DEXA SCAN  05/10/2019  . INFLUENZA VACCINE  07/21/2019    Sandra Adams does not need a referral for Community Assistance: Care Management:   no Social Work:    no Prescription Assistance:  no Nutrition/Diabetes Education:  no   Plan:  Personalized Goals Goals Addressed            This Visit's Progress   . awv       08/16/2019 AWV Goal: Exercise for General Health   Patient will verbalize understanding of the benefits of increased physical activity:  Exercising regularly is important. It will improve your overall fitness, flexibility, and endurance.  Regular exercise also will improve your overall health. It can help you control your weight, reduce stress, and improve your bone density.  Over the next year, patient will increase physical activity as  tolerated with a goal of at least 150 minutes of moderate physical activity per week.   You can tell that you are exercising at a moderate intensity if your heart starts beating faster and you  start breathing faster but can still hold a conversation.  Moderate-intensity exercise ideas include:  Walking 1 mile (1.6 km) in about 15 minutes  Biking  Hiking  Golfing  Dancing  Water aerobics  Patient will verbalize understanding of everyday activities that increase physical activity by providing examples like the following: ? Yard work, such as: ? Pushing a Conservation officer, nature ? Raking and bagging leaves ? Washing your car ? Pushing a stroller ? Shoveling snow ? Gardening ? Washing windows or floors  Patient will be able to explain general safety guidelines for exercising:   Before you start a new exercise program, talk with your health care provider.  Do not exercise so much that you hurt yourself, feel dizzy, or get very short of breath.  Wear comfortable clothes and wear shoes with good support.  Drink plenty of water while you exercise to prevent dehydration or heat stroke.  Work out until your breathing and your heartbeat get faster.       Personalized Health Maintenance & Screening Recommendations  Bone densitometry screening Colorectal cancer screening  Lung Cancer Screening Recommended: no (Low Dose CT Chest recommended if Age 25-80 years, 30 pack-year currently smoking OR have quit w/in past 15 years) Hepatitis C Screening recommended: no HIV Screening recommended: no  Advanced Directives: Written information was not prepared per patient's request.  Referrals & Orders No orders of the defined types were placed in this encounter.   Follow-up Plan . Follow-up with Sharion Balloon, FNP as planned . We will discuss cologurard/colonoscopy at physical appointment with [facility].    I have personally reviewed and noted the following in the patient's chart:   . Medical and social history . Use of alcohol, tobacco or illicit drugs  . Current medications and supplements . Functional ability and status . Nutritional status . Physical activity . Advanced  directives . List of other physicians . Hospitalizations, surgeries, and ER visits in previous 12 months . Vitals . Screenings to include cognitive, depression, and falls . Referrals and appointments  In addition, I have reviewed and discussed with Sandra Adams certain preventive protocols, quality metrics, and best practice recommendations. A written personalized care plan for preventive services as well as general preventive health recommendations is available and can be mailed to the patient at her request.     Lynnea Ferrier, LPN  [id number]  I have reviewed and agree with the above AWV documentation.   [name], FNP

## 2019-08-21 ENCOUNTER — Telehealth: Payer: Self-pay | Admitting: Family

## 2019-08-21 ENCOUNTER — Encounter: Payer: 59 | Admitting: Family

## 2019-08-21 NOTE — Telephone Encounter (Signed)
Patient is going to [facility]

## 2019-09-05 ENCOUNTER — Other Ambulatory Visit: Payer: Self-pay | Admitting: *Deleted

## 2019-09-05 DIAGNOSIS — J020 Streptococcal pharyngitis: Secondary | ICD-10-CM

## 2019-09-05 MED ORDER — FLUTICASONE PROPIONATE 50 MCG/ACT NA SUSP: NASAL | 2 refills | Status: DC

## 2019-09-12 ENCOUNTER — Telehealth: Payer: Self-pay | Admitting: Family

## 2019-09-13 NOTE — Telephone Encounter (Signed)
Pt can take Vit B 12 OTC if she wishes, but the last time this was checked it was normal.

## 2019-09-13 NOTE — Telephone Encounter (Signed)
lmtcb

## 2019-09-13 NOTE — Telephone Encounter (Signed)
Patient aware and verbalized understanding. °

## 2019-09-14 ENCOUNTER — Other Ambulatory Visit: Payer: Self-pay

## 2019-09-14 ENCOUNTER — Ambulatory Visit (INDEPENDENT_AMBULATORY_CARE_PROVIDER_SITE_OTHER): Payer: Medicare HMO | Admitting: Family Medicine

## 2019-09-14 DIAGNOSIS — N390 Urinary tract infection, site not specified: Secondary | ICD-10-CM | POA: Diagnosis not present

## 2019-09-14 DIAGNOSIS — R350 Frequency of micturition: Secondary | ICD-10-CM

## 2019-09-14 LAB — URINALYSIS, COMPLETE
Bilirubin, UA: NEGATIVE
Glucose, UA: NEGATIVE
Ketones, UA: NEGATIVE
Nitrite, UA: NEGATIVE
Protein,UA: NEGATIVE
Specific Gravity, UA: 1.025 (ref 1.005–1.030)
Urobilinogen, Ur: 0.2 mg/dL (ref 0.2–1.0)
pH, UA: 5.5 (ref 5.0–7.5)

## 2019-09-14 LAB — MICROSCOPIC EXAMINATION
Epithelial Cells (non renal): >10 /hpf — AB (ref 0–10)
Renal Epithel, UA: NONE SEEN /hpf

## 2019-09-14 MED ORDER — CEPHALEXIN 500 MG PO CAPS: 500.0000 mg | ORAL_CAPSULE | Freq: Two times a day (BID) | ORAL | 0 refills | Status: AC

## 2019-09-14 NOTE — Addendum Note (Signed)
Addended by: Janora Norlander on: 09/14/2019 05:15 PM   Modules accepted: Orders

## 2019-09-14 NOTE — Progress Notes (Addendum)
Telephone visit  Subjective: CC: ?UTI PCP: Sharion Balloon, FNP [id number] L Litchfield is a 53 y.o. female calls for telephone consult today. Patient provides verbal consent for consult held via phone.  Location of patient: home Location of provider: [facility] Others present for call: none  1. Urinary symptoms Patient reports a daily h/o urinary frequency, shakiness, urinary urgency.  She reports vivid dreams and hallucination (seeing things on the floor but "it disappears").  Denies hematuria, fevers, chills, abdominal pain, nausea, vomiting, back pain, vaginal discharge.  Patient reports a h/o frequent or recurrent UTIs.  She has seen urology and was diagnosed with overactive bladder (she was put on medication but she had other side effects).  Last visit was >1 year ago.  ROS: Per HPI  Allergies  Allergen Reactions  . Ciprofloxacin     Makes her feel weak,dizzy  . Imitrex [Sumatriptan]     Blurred vision   . Prednisone Nausea And Vomiting  . Tylenol [Acetaminophen]     Tylenol #3,Headaches  . Vicodin [Hydrocodone-Acetaminophen]     GI upset   Past Medical History:  Diagnosis Date  . Anxiety   . Arthritis   . Depression   . Menopausal symptom   . Migraine   . Scoliosis     Current Outpatient Medications:  .  ALPRAZolam (XANAX) 0.25 MG tablet, Take 1 tablet (0.25 mg total) by mouth 2 (two) times daily as needed for anxiety., Disp: 60 tablet, Rfl: 5 .  cetirizine (ZYRTEC) 10 MG tablet, Take 1 tablet (10 mg total) by mouth daily., Disp: 30 tablet, Rfl: 11 .  fluticasone (FLONASE) 50 MCG/ACT nasal spray, SPRAY 2 SPRAYS INTO EACH NOSTRIL EVERY DAY, Disp: 48 mL, Rfl: 2 .  ibuprofen (IBU) 600 MG tablet, TAKE 1 TABLET BY MOUTH EVERY 8 HOURS AS NEEDED FOR MILD OR MODERATE PAIN, Disp: 30 tablet, Rfl: 1 .  meclizine (ANTIVERT) 25 MG tablet, Take 1 tablet (25 mg total) by mouth 3 (three) times daily as needed for dizziness., Disp: 60 tablet, Rfl: 3 .  ondansetron (ZOFRAN) 4 MG tablet,  Take 1 tablet (4 mg total) by mouth every 8 (eight) hours as needed for nausea or vomiting., Disp: 20 tablet, Rfl: 0 .  pantoprazole (PROTONIX) 40 MG tablet, Take 1 tablet (40 mg total) by mouth daily. For stomach, Disp: 30 tablet, Rfl: 11 .  PARoxetine (PAXIL) 10 MG tablet, Take 1 tablet (10 mg total) by mouth daily., Disp: 90 tablet, Rfl: 1 .  Probiotic Product (PRO-BIOTIC BLEND) CAPS, Take 1 capsule by mouth daily., Disp: 30 capsule, Rfl: 1  Assessment/ Plan: 53 y.o. female   1. Urinary frequency ?Overactive bladder.  Doubt infection at this time.  Referred back to urology.  We discussed red flags.  She voiced good understanding.  She will f/u prn. - Ambulatory referral to Urology - Urinalysis, complete  2. Recurrent UTI I reviewed her chart and she has been treated for UTI several times this year.  - Ambulatory referral to Urology  Start time: 1:19pm End time: 1:34pm  Total time spent on patient care (including telephone call/ virtual visit): 15 minutes   **Patient brought in urinalysis and this is suggestive of recurrent urinary tract infection.  I sent in Keflex 500 mg twice daily.  Unfortunately, during review of the results, we were disconnected.  I will attempt to contact patient again with full instructions and antibiotic recommendation.  Meds ordered this encounter  Medications  . cephALEXin (KEFLEX) 500 MG capsule  Sig: Take 1 capsule (500 mg total) by mouth 2 (two) times daily for 7 days.    Dispense:  14 capsule    Refill:  [place], DO [place] [phone]

## 2019-09-14 NOTE — Addendum Note (Signed)
Addended by: Wyline Mood on: 09/14/2019 03:28 PM   Modules accepted: Orders

## 2019-09-18 ENCOUNTER — Other Ambulatory Visit: Payer: Self-pay

## 2019-09-18 MED ORDER — FLUCONAZOLE 150 MG PO TABS: 150.0000 mg | ORAL_TABLET | Freq: Once | ORAL | 0 refills | Status: AC

## 2019-09-20 ENCOUNTER — Telehealth: Payer: Self-pay | Admitting: Family

## 2019-09-20 ENCOUNTER — Encounter: Payer: 59 | Admitting: Family

## 2019-09-20 NOTE — Telephone Encounter (Signed)
Patient states that she has 2 doses of antibiotic is left.  Patient states that urine is still cloudy.  Patient also states that when hanging up the phone she heard someone say " I Cant deal with Michael Boston, aint no way".  Patient is very upset and crying.

## 2019-09-20 NOTE — Telephone Encounter (Signed)
I spoke to employee and handled the incident and Ms Ackerly was assured that everything will be handled appropriately and sent her a gift card for service recovery.

## 2019-09-28 ENCOUNTER — Telehealth: Payer: Self-pay | Admitting: Family Medicine

## 2019-09-28 NOTE — Telephone Encounter (Signed)
Pt advised Dr Darnell Level put in a referral to Urology and if she isn't having any urinary symptoms she doesn't need to come back in for urine. If she starts to have Dysuria or urgency or frequency again to call us back and let us know. Pt advised no GYN referral was placed and I didn't see anything in the notes regarding this. Pt voiced understanding.

## 2019-09-29 ENCOUNTER — Other Ambulatory Visit: Payer: Self-pay | Admitting: Family Medicine

## 2019-09-29 DIAGNOSIS — N390 Urinary tract infection, site not specified: Secondary | ICD-10-CM

## 2019-10-02 ENCOUNTER — Ambulatory Visit (INDEPENDENT_AMBULATORY_CARE_PROVIDER_SITE_OTHER): Payer: Medicare HMO | Admitting: Family Medicine

## 2019-10-02 ENCOUNTER — Other Ambulatory Visit: Payer: Self-pay

## 2019-10-02 DIAGNOSIS — J329 Chronic sinusitis, unspecified: Secondary | ICD-10-CM

## 2019-10-02 DIAGNOSIS — J310 Chronic rhinitis: Secondary | ICD-10-CM | POA: Diagnosis not present

## 2019-10-02 DIAGNOSIS — G479 Sleep disorder, unspecified: Secondary | ICD-10-CM

## 2019-10-02 MED ORDER — AZELASTINE HCL 0.1 % NA SOLN: 1.0000 | Freq: Two times a day (BID) | NASAL | 12 refills | Status: DC

## 2019-10-02 NOTE — Patient Instructions (Signed)

## 2019-10-02 NOTE — Progress Notes (Signed)
Telephone visit  Subjective: CC: sinus infection PCP: Sharion Balloon, FNP [id number] Sandra Adams is a 53 y.o. female calls for telephone consult today. Patient provides verbal consent for consult held via phone.  Location of patient: home Location of provider: Working remotely from home Others present for call: none  1.  Sinusitis Patient reports 3+ week history of sinus drainage, nasal congestion.  She notes that these symptoms are refractory to Flonase, Zyrtec and nasal rinses.  She is using an over-the-counter product which seems to be helping some.  Denies any purulence from nares, fevers.  She does not report any cough or shortness of breath.  She notes that previously she had been evaluated and noted to have "swelling glands in her sinuses".  She is wanting someone to evaluate her; no known sick contacts.  2.  Sleep difficulties Patient reports longstanding history of sleep difficulties.  She notes that she discontinued the Paxil 10 mg as prescribed by her PCP due to side pain.  She has ongoing anxiety and has in fact increased her Xanax to 3 times daily even though the prescription only says twice daily.  She has not used melatonin over-the-counter.  She has used Ambien in the past but felt this was not helpful.  ROS: Per HPI  Allergies  Allergen Reactions  . Ciprofloxacin     Makes her feel weak,dizzy  . Imitrex [Sumatriptan]     Blurred vision   . Prednisone Nausea And Vomiting  . Tylenol [Acetaminophen]     Tylenol #3,Headaches  . Vicodin [Hydrocodone-Acetaminophen]     GI upset   Past Medical History:  Diagnosis Date  . Anxiety   . Arthritis   . Depression   . Menopausal symptom   . Migraine   . Scoliosis     Current Outpatient Medications:  .  ALPRAZolam (XANAX) 0.25 MG tablet, Take 1 tablet (0.25 mg total) by mouth 2 (two) times daily as needed for anxiety., Disp: 60 tablet, Rfl: 5 .  cetirizine (ZYRTEC) 10 MG tablet, Take 1 tablet (10 mg total) by mouth  daily., Disp: 30 tablet, Rfl: 11 .  fluticasone (FLONASE) 50 MCG/ACT nasal spray, SPRAY 2 SPRAYS INTO EACH NOSTRIL EVERY DAY, Disp: 48 mL, Rfl: 2 .  ibuprofen (IBU) 600 MG tablet, TAKE 1 TABLET BY MOUTH EVERY 8 HOURS AS NEEDED FOR MILD OR MODERATE PAIN, Disp: 30 tablet, Rfl: 1 .  meclizine (ANTIVERT) 25 MG tablet, Take 1 tablet (25 mg total) by mouth 3 (three) times daily as needed for dizziness., Disp: 60 tablet, Rfl: 3 .  ondansetron (ZOFRAN) 4 MG tablet, Take 1 tablet (4 mg total) by mouth every 8 (eight) hours as needed for nausea or vomiting., Disp: 20 tablet, Rfl: 0 .  pantoprazole (PROTONIX) 40 MG tablet, Take 1 tablet (40 mg total) by mouth daily. For stomach, Disp: 30 tablet, Rfl: 11 .  PARoxetine (PAXIL) 10 MG tablet, Take 1 tablet (10 mg total) by mouth daily., Disp: 90 tablet, Rfl: 1 .  Probiotic Product (PRO-BIOTIC BLEND) CAPS, Take 1 capsule by mouth daily., Disp: 30 capsule, Rfl: 1  Assessment/ Plan: 53 y.o. female   1. Rhinosinusitis Refractory to Flonase, Zyrtec.  Have added Astelin nasal spray given reports of rhinorrhea.  I have also place referral to ENT for further evaluation and management.  May at some point benefit from allergy testing - Ambulatory referral to ENT - azelastine (ASTELIN) 0.1 % nasal spray; Place 1 spray into both nostrils 2 (two) times daily.  Dispense: 30 mL; Refill: 12  2. Sleep difficulties Today she noted intolerance to Paxil due to "side pain".  I will defer to her PCP for ongoing management of her anxiety.  It appears that she is taking more than prescribed of her alprazolam.  I have recommended introduction of melatonin starting at a low dose of 1 mg and gradually titrating up to the lowest effective dose with max dose of 10 mg per night.  I have recommended that she not take more than her prescribed alprazolam as this is a breech of her controlled substance contract with her PCP.  I also discussed with her that I would not be willing to take her on as  a patient if she was needing to continue the alprazolam as it appears at this time she is not using it appropriately.  She did not want to discontinue the medication and therefore I am recommending that she follow-up with [name] for ongoing medication management.  She had several other medical concerns today which I have recommended she follow-up with her PCP for including a pulmonary nodule.  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  She will schedule an annual visit for blood labs etc. with her PCP.  I will also copy our referral coordinator on this note to inquire the status of her referral to urology, placed 09/14/2019.   Start time: 10:32am End time: 10:52am  Total time spent on patient care (including telephone call/ virtual visit): 25 minutes  [place], [place] [phone]

## 2019-10-06 DIAGNOSIS — J028 Acute pharyngitis due to other specified organisms: Secondary | ICD-10-CM | POA: Diagnosis not present

## 2019-10-06 DIAGNOSIS — J329 Chronic sinusitis, unspecified: Secondary | ICD-10-CM | POA: Diagnosis not present

## 2019-10-09 NOTE — Progress Notes (Signed)
Spoke with Patient about urology ref and ENT ref - Stated they were both in Review by [facility] and Dr. Deeann Saint Office :)

## 2019-11-05 ENCOUNTER — Other Ambulatory Visit: Payer: 59 | Admitting: Advanced Practice Midwife

## 2019-11-06 ENCOUNTER — Encounter: Payer: Self-pay | Admitting: Family

## 2019-11-06 ENCOUNTER — Ambulatory Visit (INDEPENDENT_AMBULATORY_CARE_PROVIDER_SITE_OTHER): Payer: Medicare HMO | Admitting: Family

## 2019-11-06 DIAGNOSIS — F411 Generalized anxiety disorder: Secondary | ICD-10-CM | POA: Diagnosis not present

## 2019-11-06 DIAGNOSIS — K2970 Gastritis, unspecified, without bleeding: Secondary | ICD-10-CM

## 2019-11-06 DIAGNOSIS — M8949 Other hypertrophic osteoarthropathy, multiple sites: Secondary | ICD-10-CM | POA: Diagnosis not present

## 2019-11-06 DIAGNOSIS — M1990 Unspecified osteoarthritis, unspecified site: Secondary | ICD-10-CM | POA: Insufficient documentation

## 2019-11-06 DIAGNOSIS — E663 Overweight: Secondary | ICD-10-CM | POA: Diagnosis not present

## 2019-11-06 DIAGNOSIS — F331 Major depressive disorder, recurrent, moderate: Secondary | ICD-10-CM

## 2019-11-06 DIAGNOSIS — M159 Polyosteoarthritis, unspecified: Secondary | ICD-10-CM

## 2019-11-06 MED ORDER — TRAMADOL HCL 50 MG PO TABS: 50.0000 mg | ORAL_TABLET | Freq: Two times a day (BID) | ORAL | 1 refills | Status: DC | PRN

## 2019-11-06 MED ORDER — VENLAFAXINE HCL ER 37.5 MG PO CP24: 37.5000 mg | ORAL_CAPSULE | Freq: Every day | ORAL | 1 refills | Status: DC

## 2019-11-06 MED ORDER — BUSPIRONE HCL 5 MG PO TABS: 5.0000 mg | ORAL_TABLET | Freq: Two times a day (BID) | ORAL | 1 refills | Status: DC | PRN

## 2019-11-06 NOTE — Progress Notes (Signed)
Virtual Visit via telephone Note Due to COVID-19 pandemic this visit was conducted virtually. This visit type was conducted due to national recommendations for restrictions regarding the COVID-19 Pandemic (e.g. social distancing, sheltering in place) in an effort to limit this patient's exposure and mitigate transmission in our community. All issues noted in this document were discussed and addressed.  A physical exam was not performed with this format.  I connected with Sandra Adams on 11/06/19 at 9:27 AM by telephone and verified that I am speaking with the correct person using two identifiers. Sandra Adams is currently located at home and no one is currently with her during visit. The provider, [name], FNP is located in their office at time of visit.  I discussed the limitations, risks, security and privacy concerns of performing an evaluation and management service by telephone and the availability of in person appointments. I also discussed with the patient that there may be a patient responsible charge related to this service. The patient expressed understanding and agreed to proceed.   History and Present Illness:  PT calls the office today with nausea and headache that started Thursday, but improved Saturday. She reports she has not thrown up since Saturday.   She went to the Urgent Care on 10/06/19 and was negative for COVID, but had strep. She was started on Amoxicillin.   She reports she is feeling better now. However, she is requesting medication to help her sleep. She also requesting medications for her arthritis.  Anxiety Presents for follow-up visit. Symptoms include decreased concentration, depressed mood, excessive worry, insomnia, irritability and nervous/anxious behavior. Symptoms occur most days. The severity of symptoms is severe.    Arthritis Presents for follow-up visit. She complains of pain and joint warmth. The symptoms have been stable. Affected  locations include the right knee, left knee, left hip and right hip (back). Her pain is at a severity of 7/10.  Insomnia Primary symptoms: difficulty falling asleep, frequent awakening.  The current episode started more than one year. The onset quality is gradual. The problem occurs intermittently.      Review of Systems  Constitutional: Positive for irritability.  Musculoskeletal: Positive for arthritis.  Psychiatric/Behavioral: Positive for decreased concentration. The patient is nervous/anxious and has insomnia.   All other systems reviewed and are negative.    Observations/Objective: No SOB or distress noted   Assessment and Plan: 1. Viral gastritis -Resolved Zofran as needed Bland diet  2. GAD (generalized anxiety disorder) Will start Effexor today Long discussion with patient about Xanax. She will stop this and start Buspar as needed  Stress management discussed - venlafaxine XR (EFFEXOR XR) 37.5 MG 24 hr capsule; Take 1 capsule (37.5 mg total) by mouth daily with breakfast.  Dispense: 90 capsule; Refill: 1 - busPIRone (BUSPAR) 5 MG tablet; Take 1 tablet (5 mg total) by mouth 2 (two) times daily as needed.  Dispense: 60 tablet; Refill: 1  3. Moderate episode of recurrent major depressive disorder (HCC) - venlafaxine XR (EFFEXOR XR) 37.5 MG 24 hr capsule; Take 1 capsule (37.5 mg total) by mouth daily with breakfast.  Dispense: 90 capsule; Refill: 1 - busPIRone (BUSPAR) 5 MG tablet; Take 1 tablet (5 mg total) by mouth 2 (two) times daily as needed.  Dispense: 60 tablet; Refill: 1  4. Overweight (BMI 25.0-29.9)  5. Primary osteoarthritis involving multiple joints Will given #30 of Ultram. We have d/c the xanax. Pt reviewed in [facility] controlled database- No red flags noted RTO in  3 months  - traMADol (ULTRAM) 50 MG tablet; Take 1 tablet (50 mg total) by mouth every 12 (twelve) hours as needed.  Dispense: 30 tablet; Refill: 1     I discussed the assessment and treatment  plan with the patient. The patient was provided an opportunity to ask questions and all were answered. The patient agreed with the plan and demonstrated an understanding of the instructions.   The patient was advised to call back or seek an in-person evaluation if the symptoms worsen or if the condition fails to improve as anticipated.  The above assessment and management plan was discussed with the patient. The patient verbalized understanding of and has agreed to the management plan. Patient is aware to call the clinic if symptoms persist or worsen. Patient is aware when to return to the clinic for a follow-up visit. Patient educated on when it is appropriate to go to the emergency department.   Time call ended:  10:02 AM  I provided 35 minutes of non-face-to-face time during this encounter.    [name], FNP

## 2019-11-20 ENCOUNTER — Telehealth: Payer: Self-pay | Admitting: Family

## 2019-11-20 NOTE — Telephone Encounter (Signed)
appt made for pt

## 2019-11-20 NOTE — Telephone Encounter (Signed)
Patient said she has been seeing blood when using the bathroom. Patient says she is not sure if its coming from her hemroids or something else. Patient says she does feel weak at times but no other issues. Pt says she just stopped taking probiotics and yeast medication. Wants advice on what to do.

## 2019-11-23 ENCOUNTER — Other Ambulatory Visit: Payer: Self-pay

## 2019-11-26 ENCOUNTER — Encounter: Payer: Medicare HMO | Admitting: Family

## 2019-11-27 ENCOUNTER — Encounter: Payer: Self-pay | Admitting: Family

## 2019-11-27 ENCOUNTER — Ambulatory Visit (INDEPENDENT_AMBULATORY_CARE_PROVIDER_SITE_OTHER): Payer: Medicare HMO | Admitting: Family

## 2019-11-27 NOTE — Progress Notes (Signed)
   Virtual Visit via telephone Note Due to COVID-19 pandemic this visit was conducted virtually. This visit type was conducted due to national recommendations for restrictions regarding the COVID-19 Pandemic (e.g. social distancing, sheltering in place) in an effort to limit this patient's exposure and mitigate transmission in our community. All issues noted in this document were discussed and addressed.  A physical exam was not performed with this format.  Attempted to call patient at 9:05 AM. Vm left. No return phone call. Will close chart.   [name], FNP

## 2019-11-28 ENCOUNTER — Other Ambulatory Visit: Payer: Self-pay | Admitting: Family

## 2019-11-28 DIAGNOSIS — F411 Generalized anxiety disorder: Secondary | ICD-10-CM

## 2019-11-28 DIAGNOSIS — F331 Major depressive disorder, recurrent, moderate: Secondary | ICD-10-CM

## 2019-12-03 ENCOUNTER — Other Ambulatory Visit: Payer: Self-pay

## 2019-12-03 ENCOUNTER — Encounter: Payer: Self-pay | Admitting: Family Medicine

## 2019-12-03 ENCOUNTER — Ambulatory Visit (INDEPENDENT_AMBULATORY_CARE_PROVIDER_SITE_OTHER): Payer: Medicare HMO | Admitting: Family Medicine

## 2019-12-03 DIAGNOSIS — R8290 Unspecified abnormal findings in urine: Secondary | ICD-10-CM

## 2019-12-03 DIAGNOSIS — N390 Urinary tract infection, site not specified: Secondary | ICD-10-CM

## 2019-12-03 DIAGNOSIS — N3001 Acute cystitis with hematuria: Secondary | ICD-10-CM

## 2019-12-03 LAB — URINALYSIS, COMPLETE
Bilirubin, UA: NEGATIVE
Glucose, UA: NEGATIVE
Ketones, UA: NEGATIVE
Nitrite, UA: NEGATIVE
Protein,UA: NEGATIVE
Specific Gravity, UA: 1.025 (ref 1.005–1.030)
Urobilinogen, Ur: 0.2 mg/dL (ref 0.2–1.0)
pH, UA: 6 (ref 5.0–7.5)

## 2019-12-03 LAB — MICROSCOPIC EXAMINATION
Epithelial Cells (non renal): >10 /hpf — AB (ref 0–10)
Renal Epithel, UA: NONE SEEN /hpf

## 2019-12-03 MED ORDER — NITROFURANTOIN MONOHYD MACRO 100 MG PO CAPS: 100.0000 mg | ORAL_CAPSULE | Freq: Two times a day (BID) | ORAL | 0 refills | Status: AC

## 2019-12-03 NOTE — Patient Instructions (Signed)
Urinary Tract Infection, Adult A urinary tract infection (UTI) is an infection of any part of the urinary tract. The urinary tract includes:  The kidneys.  The ureters.  The bladder.  The urethra. These organs make, store, and get rid of pee (urine) in the body. What are the causes? This is caused by germs (bacteria) in your genital area. These germs grow and cause swelling (inflammation) of your urinary tract. What increases the risk? You are more likely to develop this condition if:  You have a small, thin tube (catheter) to drain pee.  You cannot control when you pee or poop (incontinence).  You are female, and: ? You use these methods to prevent pregnancy: ? A medicine that kills sperm (spermicide). ? A device that blocks sperm (diaphragm). ? You have low levels of a female hormone (estrogen). ? You are pregnant.  You have genes that add to your risk.  You are sexually active.  You take antibiotic medicines.  You have trouble peeing because of: ? A prostate that is bigger than normal, if you are female. ? A blockage in the part of your body that drains pee from the bladder (urethra). ? A kidney stone. ? A nerve condition that affects your bladder (neurogenic bladder). ? Not getting enough to drink. ? Not peeing often enough.  You have other conditions, such as: ? Diabetes. ? A weak disease-fighting system (immune system). ? Sickle cell disease. ? Gout. ? Injury of the spine. What are the signs or symptoms? Symptoms of this condition include:  Needing to pee right away (urgently).  Peeing often.  Peeing small amounts often.  Pain or burning when peeing.  Blood in the pee.  Pee that smells bad or not like normal.  Trouble peeing.  Pee that is cloudy.  Fluid coming from the vagina, if you are female.  Pain in the belly or lower back. Other symptoms include:  Throwing up (vomiting).  No urge to eat.  Feeling mixed up (confused).  Being tired  and grouchy (irritable).  A fever.  Watery poop (diarrhea). How is this treated? This condition may be treated with:  Antibiotic medicine.  Other medicines.  Drinking enough water. Follow these instructions at home:  Medicines  Take over-the-counter and prescription medicines only as told by your doctor.  If you were prescribed an antibiotic medicine, take it as told by your doctor. Do not stop taking it even if you start to feel better. General instructions  Make sure you: ? Pee until your bladder is empty. ? Do not hold pee for a long time. ? Empty your bladder after sex. ? Wipe from front to back after pooping if you are a female. Use each tissue one time when you wipe.  Drink enough fluid to keep your pee pale yellow.  Keep all follow-up visits as told by your doctor. This is important. Contact a doctor if:  You do not get better after 1-2 days.  Your symptoms go away and then come back. Get help right away if:  You have very bad back pain.  You have very bad pain in your lower belly.  You have a fever.  You are sick to your stomach (nauseous).  You are throwing up. Summary  A urinary tract infection (UTI) is an infection of any part of the urinary tract.  This condition is caused by germs in your genital area.  There are many risk factors for a UTI. These include having a small, thin   tube to drain pee and not being able to control when you pee or poop.  Treatment includes antibiotic medicines for germs.  Drink enough fluid to keep your pee pale yellow. This information is not intended to replace advice given to you by your health care provider. Make sure you discuss any questions you have with your health care provider. Document Released: 05/24/2008 Document Revised: 11/23/2018 Document Reviewed: 06/15/2018 Elsevier Patient Education  2020 [employer]  

## 2019-12-03 NOTE — Progress Notes (Signed)
Virtual Visit via Telephone Note  I connected with Sandra Adams on 12/03/19 at 2:36 PM by telephone and verified that I am speaking with the correct person using two identifiers. Sandra Adams is currently located at home and nobody is currently with her during this visit. The provider, Loman Brooklyn, FNP is located in their home at time of visit.  I discussed the limitations, risks, security and privacy concerns of performing an evaluation and management service by telephone and the availability of in person appointments. I also discussed with the patient that there may be a patient responsible charge related to this service. The patient expressed understanding and agreed to proceed.  Subjective: PCP: Sharion Balloon, FNP  Chief Complaint  Patient presents with  . Urinary Tract Infection   Urinary Tract Infection: Patient complains of abnormal smelling urine. She has had symptoms for 4 days. Patient also complains of back pain. Patient denies stomach ache. Patient does have a history of recurrent UTI.  Patient does not have a history of pyelonephritis.    ROS: Per HPI  Current Outpatient Medications:  .  azelastine (ASTELIN) 0.1 % nasal spray, Place 1 spray into both nostrils 2 (two) times daily., Disp: 30 mL, Rfl: 12 .  busPIRone (BUSPAR) 5 MG tablet, TAKE 1 TABLET BY MOUTH 2 TIMES DAILY AS NEEDED., Disp: 180 tablet, Rfl: 0 .  cetirizine (ZYRTEC) 10 MG tablet, Take 1 tablet (10 mg total) by mouth daily., Disp: 30 tablet, Rfl: 11 .  fluticasone (FLONASE) 50 MCG/ACT nasal spray, SPRAY 2 SPRAYS INTO EACH NOSTRIL EVERY DAY, Disp: 48 mL, Rfl: 2 .  ibuprofen (IBU) 600 MG tablet, TAKE 1 TABLET BY MOUTH EVERY 8 HOURS AS NEEDED FOR MILD OR MODERATE PAIN, Disp: 30 tablet, Rfl: 1 .  meclizine (ANTIVERT) 25 MG tablet, Take 1 tablet (25 mg total) by mouth 3 (three) times daily as needed for dizziness., Disp: 60 tablet, Rfl: 3 .  nitrofurantoin, macrocrystal-monohydrate, (MACROBID) 100 MG  capsule, Take 1 capsule (100 mg total) by mouth 2 (two) times daily for 5 days. 1 po BId, Disp: 10 capsule, Rfl: 0 .  ondansetron (ZOFRAN) 4 MG tablet, Take 1 tablet (4 mg total) by mouth every 8 (eight) hours as needed for nausea or vomiting., Disp: 20 tablet, Rfl: 0 .  pantoprazole (PROTONIX) 40 MG tablet, Take 1 tablet (40 mg total) by mouth daily. For stomach, Disp: 30 tablet, Rfl: 11 .  Probiotic Product (PRO-BIOTIC BLEND) CAPS, Take 1 capsule by mouth daily., Disp: 30 capsule, Rfl: 1 .  traMADol (ULTRAM) 50 MG tablet, Take 1 tablet (50 mg total) by mouth every 12 (twelve) hours as needed., Disp: 30 tablet, Rfl: 1 .  venlafaxine XR (EFFEXOR XR) 37.5 MG 24 hr capsule, Take 1 capsule (37.5 mg total) by mouth daily with breakfast., Disp: 90 capsule, Rfl: 1  Allergies  Allergen Reactions  . Ciprofloxacin     Makes her feel weak,dizzy  . Imitrex [Sumatriptan]     Blurred vision   . Prednisone Nausea And Vomiting  . Tylenol [Acetaminophen]     Tylenol #3,Headaches  . Vicodin [Hydrocodone-Acetaminophen]     GI upset   Past Medical History:  Diagnosis Date  . Anxiety   . Arthritis   . Depression   . Menopausal symptom   . Migraine   . Scoliosis     Observations/Objective: A&O  No respiratory distress or wheezing audible over the phone Mood, judgement, and thought processes all WNL  Assessment  and Plan: 1. Acute cystitis with hematuria - Education provided on UTI.  Encouraged adequate hydration. - nitrofurantoin, macrocrystal-monohydrate, (MACROBID) 100 MG capsule; Take 1 capsule (100 mg total) by mouth 2 (two) times daily for 5 days. 1 po BId  Dispense: 10 capsule; Refill: 0 - urinalysis- dip and micro; Future  2. Abnormal urine odor - urinalysis- dip and micro - Urine culture  3. Recurrent UTI - Ambulatory referral to Urology   Follow Up Instructions:  I discussed the assessment and treatment plan with the patient. The patient was provided an opportunity to ask  questions and all were answered. The patient agreed with the plan and demonstrated an understanding of the instructions.   The patient was advised to call back or seek an in-person evaluation if the symptoms worsen or if the condition fails to improve as anticipated.  The above assessment and management plan was discussed with the patient. The patient verbalized understanding of and has agreed to the management plan. Patient is aware to call the clinic if symptoms persist or worsen. Patient is aware when to return to the clinic for a follow-up visit. Patient educated on when it is appropriate to go to the emergency department.   Time call ended: 2:51 PM  I provided 18 minutes of non-face-to-face time during this encounter.  Hendricks Limes, MSN, APRN, FNP-C [facility] 12/03/19

## 2019-12-05 ENCOUNTER — Telehealth: Payer: Self-pay | Admitting: Family

## 2019-12-05 ENCOUNTER — Other Ambulatory Visit: Payer: Self-pay | Admitting: Family

## 2019-12-05 DIAGNOSIS — F411 Generalized anxiety disorder: Secondary | ICD-10-CM

## 2019-12-05 DIAGNOSIS — E663 Overweight: Secondary | ICD-10-CM

## 2019-12-05 DIAGNOSIS — F1129 Opioid dependence with unspecified opioid-induced disorder: Secondary | ICD-10-CM

## 2019-12-05 LAB — URINE CULTURE

## 2019-12-05 NOTE — Telephone Encounter (Signed)
Patient called stating that the new medication she was recently prescribed for her anxiety is making patient have bad stomach pain, nausea, and shaking. Patient would like to be switched back to her other anxiety medication.

## 2019-12-06 DIAGNOSIS — J0100 Acute maxillary sinusitis, unspecified: Secondary | ICD-10-CM | POA: Diagnosis not present

## 2019-12-06 DIAGNOSIS — R05 Cough: Secondary | ICD-10-CM | POA: Diagnosis not present

## 2019-12-07 ENCOUNTER — Telehealth: Payer: Self-pay | Admitting: Family Medicine

## 2019-12-07 DIAGNOSIS — F411 Generalized anxiety disorder: Secondary | ICD-10-CM

## 2019-12-07 DIAGNOSIS — F331 Major depressive disorder, recurrent, moderate: Secondary | ICD-10-CM

## 2019-12-07 NOTE — Telephone Encounter (Signed)
She can not have xanax and ultram as discussed during her visit. I can do a referral to [facility] health if she would like?

## 2019-12-07 NOTE — Telephone Encounter (Signed)
Referral to Behavorial Health placed.  

## 2019-12-07 NOTE — Telephone Encounter (Signed)
Patient would like to go ahead with referral

## 2019-12-07 NOTE — Telephone Encounter (Signed)
Patient wants to get rid of buspar she does not want to take it anymore.

## 2019-12-24 ENCOUNTER — Telehealth: Payer: Self-pay | Admitting: *Deleted

## 2019-12-24 ENCOUNTER — Other Ambulatory Visit: Payer: Medicare HMO

## 2019-12-24 ENCOUNTER — Other Ambulatory Visit: Payer: Self-pay

## 2019-12-24 DIAGNOSIS — N3001 Acute cystitis with hematuria: Secondary | ICD-10-CM

## 2019-12-24 LAB — MICROSCOPIC EXAMINATION: Renal Epithel, UA: NONE SEEN /hpf

## 2019-12-24 LAB — URINALYSIS, COMPLETE
Bilirubin, UA: NEGATIVE
Glucose, UA: NEGATIVE
Ketones, UA: NEGATIVE
Nitrite, UA: NEGATIVE
Protein,UA: NEGATIVE
Specific Gravity, UA: 1.02 (ref 1.005–1.030)
Urobilinogen, Ur: 0.2 mg/dL (ref 0.2–1.0)
pH, UA: 6 (ref 5.0–7.5)

## 2019-12-24 MED ORDER — FLUCONAZOLE 150 MG PO TABS: 150.0000 mg | ORAL_TABLET | ORAL | 0 refills | Status: DC | PRN

## 2019-12-24 NOTE — Addendum Note (Signed)
Addended by: [name] A on: 12/24/2019 03:58 PM   Modules accepted: Orders

## 2019-12-24 NOTE — Telephone Encounter (Signed)
Diflucan Prescription sent to pharmacy   

## 2019-12-24 NOTE — Telephone Encounter (Signed)
Left message- rx has been sent to the pharmacy.  

## 2019-12-24 NOTE — Telephone Encounter (Signed)
Wants a diflucan because antibiotic caused itching in vagina.  She came by and left a urine specimen. Please send to [facility]. Call around 5 pm, please.

## 2019-12-26 LAB — URINE CULTURE

## 2019-12-27 ENCOUNTER — Other Ambulatory Visit: Payer: Self-pay | Admitting: Family

## 2020-01-08 ENCOUNTER — Encounter: Payer: Self-pay | Admitting: Family

## 2020-01-08 ENCOUNTER — Ambulatory Visit (INDEPENDENT_AMBULATORY_CARE_PROVIDER_SITE_OTHER): Payer: Medicare HMO | Admitting: Family

## 2020-01-08 DIAGNOSIS — R399 Unspecified symptoms and signs involving the genitourinary system: Secondary | ICD-10-CM | POA: Diagnosis not present

## 2020-01-08 DIAGNOSIS — F331 Major depressive disorder, recurrent, moderate: Secondary | ICD-10-CM | POA: Diagnosis not present

## 2020-01-08 DIAGNOSIS — F411 Generalized anxiety disorder: Secondary | ICD-10-CM

## 2020-01-08 DIAGNOSIS — E663 Overweight: Secondary | ICD-10-CM | POA: Diagnosis not present

## 2020-01-08 LAB — MICROSCOPIC EXAMINATION
Epithelial Cells (non renal): >10 /hpf — AB (ref 0–10)
Renal Epithel, UA: NONE SEEN /hpf

## 2020-01-08 LAB — URINALYSIS, COMPLETE
Bilirubin, UA: NEGATIVE
Glucose, UA: NEGATIVE
Ketones, UA: NEGATIVE
Leukocytes,UA: NEGATIVE
Nitrite, UA: NEGATIVE
Protein,UA: NEGATIVE
Specific Gravity, UA: >1.03 — ABNORMAL HIGH (ref 1.005–1.030)
Urobilinogen, Ur: 0.2 mg/dL (ref 0.2–1.0)
pH, UA: 5.5 (ref 5.0–7.5)

## 2020-01-08 MED ORDER — VENLAFAXINE HCL ER 37.5 MG PO CP24: 37.5000 mg | ORAL_CAPSULE | Freq: Every day | ORAL | 1 refills | Status: DC

## 2020-01-08 MED ORDER — IBUPROFEN 600 MG PO TABS: ORAL_TABLET | ORAL | 2 refills | Status: DC

## 2020-01-08 MED ORDER — ALPRAZOLAM 0.25 MG PO TABS: 0.2500 mg | ORAL_TABLET | Freq: Two times a day (BID) | ORAL | 5 refills | Status: DC | PRN

## 2020-01-08 NOTE — Progress Notes (Signed)
Virtual Visit via telephone Note Due to COVID-19 pandemic this visit was conducted virtually. This visit type was conducted due to national recommendations for restrictions regarding the COVID-19 Pandemic (e.g. social distancing, sheltering in place) in an effort to limit this patient's exposure and mitigate transmission in our community. All issues noted in this document were discussed and addressed.  A physical exam was not performed with this format.  I connected with Sandra Adams on 01/08/20 at 3:33 pm by telephone and verified that I am speaking with the correct person using two identifiers. Sandra Adams is currently located at home and no one  is currently with her during visit. The provider, [name], FNP is located in their office at time of visit.  I discussed the limitations, risks, security and privacy concerns of performing an evaluation and management service by telephone and the availability of in person appointments. I also discussed with the patient that there may be a patient responsible charge related to this service. The patient expressed understanding and agreed to proceed.   History and Present Illness:  Urinary Frequency  This is a recurrent problem. The current episode started 1 to 4 weeks ago. The problem occurs every urination. The problem has been waxing and waning. The patient is experiencing no pain. There has been no fever. She is sexually active. Associated symptoms include frequency and urgency. Pertinent negatives include no discharge, flank pain, hematuria, nausea or vomiting. She has tried increased fluids for the symptoms. The treatment provided mild relief.  Anxiety Presents for follow-up visit. Symptoms include depressed mood, excessive worry, irritability, nervous/anxious behavior, panic and restlessness. Patient reports no nausea. Symptoms occur occasionally.        Review of Systems  Constitutional: Positive for irritability.    Gastrointestinal: Negative for nausea and vomiting.  Genitourinary: Positive for frequency and urgency. Negative for flank pain and hematuria.  Psychiatric/Behavioral: The patient is nervous/anxious.   All other systems reviewed and are negative.    Observations/Objective: No SOB or distress noted   Assessment and Plan: Sandra Adams comes in today with chief complaint of No chief complaint on file.   Diagnosis and orders addressed:  1. UTI symptoms - Urine Culture - Urinalysis, Complete  2. GAD (generalized anxiety disorder) Will restart xanax today. Pt has stopped Ultram.  Continue Effexor  Stress management  - ALPRAZolam (XANAX) 0.25 MG tablet; Take 1 tablet (0.25 mg total) by mouth 2 (two) times daily as needed for anxiety.  Dispense: 60 tablet; Refill: 5 - venlafaxine XR (EFFEXOR XR) 37.5 MG 24 hr capsule; Take 1 capsule (37.5 mg total) by mouth daily with breakfast.  Dispense: 90 capsule; Refill: 1  3. Overweight (BMI 25.0-29.9) - ALPRAZolam (XANAX) 0.25 MG tablet; Take 1 tablet (0.25 mg total) by mouth 2 (two) times daily as needed for anxiety.  Dispense: 60 tablet; Refill: 5  4. Moderate episode of recurrent major depressive disorder (HCC)  - venlafaxine XR (EFFEXOR XR) 37.5 MG 24 hr capsule; Take 1 capsule (37.5 mg total) by mouth daily with breakfast.  Dispense: 90 capsule; Refill: 1     I discussed the assessment and treatment plan with the patient. The patient was provided an opportunity to ask questions and all were answered. The patient agreed with the plan and demonstrated an understanding of the instructions.   The patient was advised to call back or seek an in-person evaluation if the symptoms worsen or if the condition fails to improve as anticipated.  The  above assessment and management plan was discussed with the patient. The patient verbalized understanding of and has agreed to the management plan. Patient is aware to call the clinic if symptoms  persist or worsen. Patient is aware when to return to the clinic for a follow-up visit. Patient educated on when it is appropriate to go to the emergency department.   Time call ended:  4:03 pm   I provided 30 minutes of non-face-to-face time during this encounter.    [name], FNP

## 2020-01-10 LAB — URINE CULTURE

## 2020-01-16 ENCOUNTER — Ambulatory Visit: Payer: Medicare HMO | Admitting: Urology

## 2020-01-17 DIAGNOSIS — R21 Rash and other nonspecific skin eruption: Secondary | ICD-10-CM | POA: Diagnosis not present

## 2020-01-26 DIAGNOSIS — J029 Acute pharyngitis, unspecified: Secondary | ICD-10-CM | POA: Diagnosis not present

## 2020-01-26 DIAGNOSIS — J020 Streptococcal pharyngitis: Secondary | ICD-10-CM | POA: Diagnosis not present

## 2020-01-26 DIAGNOSIS — R0981 Nasal congestion: Secondary | ICD-10-CM | POA: Diagnosis not present

## 2020-01-28 ENCOUNTER — Telehealth: Payer: Self-pay | Admitting: Family

## 2020-01-28 ENCOUNTER — Encounter: Payer: Self-pay | Admitting: Family Medicine

## 2020-01-28 ENCOUNTER — Ambulatory Visit (INDEPENDENT_AMBULATORY_CARE_PROVIDER_SITE_OTHER): Payer: Medicare Other | Admitting: Family Medicine

## 2020-01-28 DIAGNOSIS — J069 Acute upper respiratory infection, unspecified: Secondary | ICD-10-CM | POA: Diagnosis not present

## 2020-01-28 MED ORDER — PSEUDOEPH-BROMPHEN-DM 30-2-10 MG/5ML PO SYRP: 5.0000 mL | ORAL_SOLUTION | Freq: Four times a day (QID) | ORAL | 0 refills | Status: AC | PRN

## 2020-01-28 NOTE — Telephone Encounter (Signed)
Patient aware.

## 2020-01-28 NOTE — Telephone Encounter (Signed)
She can get the Delsym over the counter as discussed.

## 2020-01-28 NOTE — Telephone Encounter (Signed)
Cough med sent in today was not covered by patient's insurance.  Is there an alternative that can be prescribed?

## 2020-01-28 NOTE — Telephone Encounter (Signed)
Pt just had a televisit with Dr Thayer Ohm. Is requesting that Dr Thayer Ohm send a generic brand medication to her pharmacy because what she sent in is not covered by pt's insurance ([place]).

## 2020-01-28 NOTE — Progress Notes (Signed)
Virtual Visit via telephone Note Due to COVID-19 pandemic this visit was conducted virtually. This visit type was conducted due to national recommendations for restrictions regarding the COVID-19 Pandemic (e.g. social distancing, sheltering in place) in an effort to limit this patient's exposure and mitigate transmission in our community. All issues noted in this document were discussed and addressed.  A physical exam was not performed with this format.   I connected with Sandra Adams on 01/28/2020 at 30 by telephone and verified that I am speaking with the correct person using two identifiers. Sandra Adams is currently located at home and no one is currently with them during visit. The provider, Monia Pouch, FNP is located in their office at time of visit.  I discussed the limitations, risks, security and privacy concerns of performing an evaluation and management service by telephone and the availability of in person appointments. I also discussed with the patient that there may be a patient responsible charge related to this service. The patient expressed understanding and agreed to proceed.  Subjective:  Patient ID: Sandra Adams, female    DOB: 1966-02-27, 54 y.o.   MRN: [id number]  Chief Complaint:  URI   HPI: Sandra Adams is a 54 y.o. female presenting on 01/28/2020 for URI   Pt seen at [facility] on 01/26/2020 and started on amoxicillin for strep throat. Pt states she continues to have cough, congestion, and rhinorrhea. Pt requesting something "strong for her cough that will make her sleep". Pt has tessalon and is not taking. States it does not make her sleep.   URI  This is a new problem. The current episode started in the past 7 days. The problem has been unchanged. Associated symptoms include congestion, coughing, headaches, rhinorrhea, a sore throat and swollen glands. Pertinent negatives include no abdominal pain, chest pain, diarrhea, dysuria, ear pain, joint pain, joint  swelling, nausea, neck pain, plugged ear sensation, rash, sinus pain, sneezing, vomiting or wheezing. Treatments tried: antibiotics for strep. The treatment provided no relief.     Relevant past medical, surgical, family, and social history reviewed and updated as indicated.  Allergies and medications reviewed and updated.   Past Medical History:  Diagnosis Date  . Anxiety   . Arthritis   . Depression   . Menopausal symptom   . Migraine   . Scoliosis     Past Surgical History:  Procedure Laterality Date  . KNEE SURGERY Left 1974 and 1979    Social History   Socioeconomic History  . Marital status: Divorced    Spouse name: Not on file  . Number of children: 0  . Years of education: 12th  . Highest education level: High school graduate  Occupational History  . 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 Smoking status: Never Smoker  . Smokeless tobacco: Never Used  Substance and Sexual Activity  . Alcohol use: No  . Drug use: No  . Sexual activity: Not Currently    Partners: Male    Birth control/protection: None  Other Topics Concern  . Not on file  Social History Narrative   Single,no children   Right handed   12 th grade   No caffeine   Social Determinants of Health   Financial Resource Strain: Low Risk   . Difficulty of Paying Living Expenses: Not very hard  Food Insecurity: No Food Insecurity  . Worried About Charity fundraiser in the Last  Year: Never true  . Ran Out of Food in the Last Year: Never true  Transportation Needs: No Transportation Needs  . Lack of Transportation (Medical): No  . Lack of Transportation (Non-Medical): No  Physical Activity: Insufficiently Active  . Days of Exercise per Week: 2 days  . Minutes of Exercise per Session: 20 min  Stress: No Stress Concern Present  . Feeling of Stress : Only a little  Social Connections: Somewhat Isolated  . Frequency of Communication with Friends and  Family: More than three times a week  . Frequency of Social Gatherings with Friends and Family: Not on file  . Attends Religious Services: More than 4 times per year  . Active Member of Clubs or Organizations: No  . Attends Archivist Meetings: Never  . Marital Status: Divorced  Human resources officer Violence: Not At Risk  . Fear of Current or Ex-Partner: No  . Emotionally Abused: No  . Physically Abused: No  . Sexually Abused: No    Outpatient Encounter Medications as of 01/28/2020  Medication Sig  . ALPRAZolam (XANAX) 0.25 MG tablet Take 1 tablet (0.25 mg total) by mouth 2 (two) times daily as needed for anxiety.  Marland Kitchen azelastine (ASTELIN) 0.1 % nasal spray Place 1 spray into both nostrils 2 (two) times daily.  . brompheniramine-pseudoephedrine-DM 30-2-10 MG/5ML syrup Take 5 mLs by mouth 4 (four) times daily as needed for up to 10 days.  . cetirizine (ZYRTEC) 10 MG tablet Take 1 tablet (10 mg total) by mouth daily.  . fluticasone (FLONASE) 50 MCG/ACT nasal spray SPRAY 2 SPRAYS INTO EACH NOSTRIL EVERY DAY  . ibuprofen (IBU) 600 MG tablet TAKE 1 TABLET BY MOUTH EVERY 8 HOURS AS NEEDED FOR MILD OR MODERATE PAIN  . ondansetron (ZOFRAN) 4 MG tablet Take 1 tablet (4 mg total) by mouth every 8 (eight) hours as needed for nausea or vomiting.  . Probiotic Product (PRO-BIOTIC BLEND) CAPS Take 1 capsule by mouth daily.  Marland Kitchen venlafaxine XR (EFFEXOR XR) 37.5 MG 24 hr capsule Take 1 capsule (37.5 mg total) by mouth daily with breakfast.   No facility-administered encounter medications on file as of 01/28/2020.    Allergies  Allergen Reactions  . Ciprofloxacin     Makes her feel weak,dizzy  . Imitrex [Sumatriptan]     Blurred vision   . Prednisone Nausea And Vomiting  . Tylenol [Acetaminophen]     Tylenol #3,Headaches  . Vicodin [Hydrocodone-Acetaminophen]     GI upset    Review of Systems  Constitutional: Negative for activity change, appetite change, chills, diaphoresis, fatigue, fever  and unexpected weight change.  HENT: Positive for congestion, postnasal drip, rhinorrhea and sore throat. Negative for ear pain, sinus pressure, sinus pain and sneezing.   Eyes: Negative.   Respiratory: Positive for cough. Negative for chest tightness and wheezing.   Cardiovascular: Negative for chest pain, palpitations and leg swelling.  Gastrointestinal: Negative for abdominal pain, blood in stool, constipation, diarrhea, nausea and vomiting.  Endocrine: Negative.   Genitourinary: Negative for dysuria, frequency and urgency.  Musculoskeletal: Negative for arthralgias, joint pain, myalgias and neck pain.  Skin: Negative.  Negative for rash.  Allergic/Immunologic: Negative.   Neurological: Positive for headaches. Negative for dizziness and weakness.  Hematological: Negative.   Psychiatric/Behavioral: Negative for confusion, hallucinations, sleep disturbance and suicidal ideas.  All other systems reviewed and are negative.        Observations/Objective: No vital signs or physical exam, this was a telephone or virtual health encounter.  Pt alert and oriented, answers all questions appropriately, and able to speak in full sentences.    Assessment and Plan: Janiene was seen today for uri.  Diagnoses and all orders for this visit:  URI with cough and congestion Pt currently on amoxicillin for strep throat. Pt was given tessalon for cough. Reports this is not working for her cough and rhinorrhea. Pt requesting something "strong" for her cough. Pt aware that she could try Delsym over the counter for cough if below is ineffective. Symptomatic care discussed in detail. Follow up if needed.  -     brompheniramine-pseudoephedrine-DM 30-2-10 MG/5ML syrup; Take 5 mLs by mouth 4 (four) times daily as needed for up to 10 days.     Follow Up Instructions: Return if symptoms worsen or fail to improve.    I discussed the assessment and treatment plan with the patient. The patient was provided  an opportunity to ask questions and all were answered. The patient agreed with the plan and demonstrated an understanding of the instructions.   The patient was advised to call back or seek an in-person evaluation if the symptoms worsen or if the condition fails to improve as anticipated.  The above assessment and management plan was discussed with the patient. The patient verbalized understanding of and has agreed to the management plan. Patient is aware to call the clinic if they develop any new symptoms or if symptoms persist or worsen. Patient is aware when to return to the clinic for a follow-up visit. Patient educated on when it is appropriate to go to the emergency department.    I provided 15 minutes of non-face-to-face time during this encounter. The call started at 1035. The call ended at 1050. The other time was used for coordination of care.    Monia Pouch, FNP-C [facility] [address] [place], [place] [address] [phone] 01/28/2020

## 2020-02-01 ENCOUNTER — Telehealth: Payer: Self-pay | Admitting: Nurse Practitioner

## 2020-02-01 NOTE — Telephone Encounter (Signed)
Left message to call back  

## 2020-02-04 ENCOUNTER — Ambulatory Visit (INDEPENDENT_AMBULATORY_CARE_PROVIDER_SITE_OTHER): Payer: Medicare Other | Admitting: Family

## 2020-02-04 ENCOUNTER — Encounter: Payer: Self-pay | Admitting: Family

## 2020-02-04 DIAGNOSIS — R109 Unspecified abdominal pain: Secondary | ICD-10-CM | POA: Diagnosis not present

## 2020-02-04 LAB — URINALYSIS, COMPLETE
Bilirubin, UA: NEGATIVE
Glucose, UA: NEGATIVE
Ketones, UA: NEGATIVE
Leukocytes,UA: NEGATIVE
Nitrite, UA: NEGATIVE
Protein,UA: NEGATIVE
Specific Gravity, UA: >1.03 — ABNORMAL HIGH (ref 1.005–1.030)
Urobilinogen, Ur: 0.2 mg/dL (ref 0.2–1.0)
pH, UA: 7 (ref 5.0–7.5)

## 2020-02-04 LAB — MICROSCOPIC EXAMINATION: Renal Epithel, UA: NONE SEEN /hpf

## 2020-02-04 LAB — WET PREP FOR TRICH, YEAST, CLUE
Clue Cell Exam: NEGATIVE
Trichomonas Exam: NEGATIVE
Yeast Exam: NEGATIVE

## 2020-02-04 NOTE — Telephone Encounter (Signed)
Patient's test here was negative for UTI.

## 2020-02-04 NOTE — Progress Notes (Signed)
   Virtual Visit via telephone Note Due to COVID-19 pandemic this visit was conducted virtually. This visit type was conducted due to national recommendations for restrictions regarding the COVID-19 Pandemic (e.g. social distancing, sheltering in place) in an effort to limit this patient's exposure and mitigate transmission in our community. All issues noted in this document were discussed and addressed.  A physical exam was not performed with this format.  I connected with Sandra Adams on 02/04/20 at 11:20 AM by telephone and verified that I am speaking with the correct person using two identifiers. Sandra Adams is currently located at office and no one is currently with her during visit. The provider, [name], FNP is located in their office at time of visit.  I discussed the limitations, risks, security and privacy concerns of performing an evaluation and management service by telephone and the availability of in person appointments. I also discussed with the patient that there may be a patient responsible charge related to this service. The patient expressed understanding and agreed to proceed.   History and Present Illness:  PT calls the office today with flank pain that started this morning. She completed Amoxicillin for strep throat yesterday.  Flank Pain This is a recurrent problem. The current episode started today. The problem occurs intermittently. The problem has been waxing and waning since onset. The pain does not radiate. The pain is at a severity of 3/10. The pain is mild. Pertinent negatives include no bladder incontinence, bowel incontinence, dysuria, headaches, numbness or tingling. Treatments tried: forcing fluids. The treatment provided mild relief.      Review of Systems  Gastrointestinal: Negative for bowel incontinence.  Genitourinary: Positive for flank pain. Negative for bladder incontinence and dysuria.  Neurological: Negative for tingling, numbness and  headaches.  All other systems reviewed and are negative.    Observations/Objective: No SOB or distress noted  Assessment and Plan: 1. Flank pain Urine and wet prep pending Could be MSK pain. Pt just completed Amoxicillin, could be yeast infection.  Eat yogurt daily Keep clean and dry Call if symptoms worsen or do not improve  - Urinalysis, Complete - Urine Culture - WET PREP FOR TRICH, YEAST, CLUE      I discussed the assessment and treatment plan with the patient. The patient was provided an opportunity to ask questions and all were answered. The patient agreed with the plan and demonstrated an understanding of the instructions.   The patient was advised to call back or seek an in-person evaluation if the symptoms worsen or if the condition fails to improve as anticipated.  The above assessment and management plan was discussed with the patient. The patient verbalized understanding of and has agreed to the management plan. Patient is aware to call the clinic if symptoms persist or worsen. Patient is aware when to return to the clinic for a follow-up visit. Patient educated on when it is appropriate to go to the emergency department.   Time call ended: 11:30 AM   I provided 10 minutes of non-face-to-face time during this encounter.    [name], FNP

## 2020-02-05 LAB — URINE CULTURE

## 2020-02-06 ENCOUNTER — Ambulatory Visit: Payer: Medicare HMO | Admitting: Urology

## 2020-02-26 ENCOUNTER — Telehealth: Payer: Self-pay | Admitting: Family

## 2020-02-26 NOTE — Telephone Encounter (Signed)
Patient has tooth pain and went to dentist they have given her 800mg  Ibuprofen. Because her Kidney Functions. Is it okay

## 2020-02-26 NOTE — Telephone Encounter (Signed)
PATIENT AWARE

## 2020-02-26 NOTE — Telephone Encounter (Signed)
This is fine 

## 2020-02-29 ENCOUNTER — Other Ambulatory Visit: Payer: Self-pay | Admitting: Family

## 2020-02-29 DIAGNOSIS — F331 Major depressive disorder, recurrent, moderate: Secondary | ICD-10-CM

## 2020-02-29 DIAGNOSIS — F411 Generalized anxiety disorder: Secondary | ICD-10-CM

## 2020-03-11 ENCOUNTER — Telehealth: Payer: Self-pay | Admitting: Family

## 2020-03-11 NOTE — Telephone Encounter (Signed)
Pt does not need to take daily aspirin. She can take multivitamin daily.

## 2020-03-12 NOTE — Telephone Encounter (Signed)
Aware. 

## 2020-03-17 ENCOUNTER — Encounter: Payer: Medicare HMO | Admitting: Adult Health

## 2020-03-25 ENCOUNTER — Ambulatory Visit (INDEPENDENT_AMBULATORY_CARE_PROVIDER_SITE_OTHER): Payer: Medicare Other | Admitting: Family Medicine

## 2020-03-25 ENCOUNTER — Other Ambulatory Visit: Payer: Medicare Other

## 2020-03-25 DIAGNOSIS — R350 Frequency of micturition: Secondary | ICD-10-CM

## 2020-03-25 NOTE — Progress Notes (Signed)
Telephone visit  Subjective: CC: UTI PCP: Sharion Balloon, FNP [phone] Sandra Adams is a 54 y.o. female calls for telephone consult today. Patient provides verbal consent for consult held via phone.  Due to COVID-19 pandemic this visit was conducted virtually. This visit type was conducted due to national recommendations for restrictions regarding the COVID-19 Pandemic (e.g. social distancing, sheltering in place) in an effort to limit this patient's exposure and mitigate transmission in our community. All issues noted in this document were discussed and addressed.  A physical exam was not performed with this format.   Location of patient: home Location of provider: [facility] Others present for call: none  1. Urinary symptoms Patient reports a 1 day h/o feeling fatigued and intermittent urinary frequency. She worried that she had another UTI.  No urgency, hematuria, fevers, chills, abdominal pain, nausea, vomiting, back pain, vaginal discharge.  Patient has used nothing but hydration for symptoms.  Patient reports a h/o frequent or recurrent UTIs.    ROS: Per HPI  Allergies  Allergen Reactions  . Ciprofloxacin     Makes her feel weak,dizzy  . Imitrex [Sumatriptan]     Blurred vision   . Prednisone Nausea And Vomiting  . Tylenol [Acetaminophen]     Tylenol #3,Headaches  . Vicodin [Hydrocodone-Acetaminophen]     GI upset   Past Medical History:  Diagnosis Date  . Anxiety   . Arthritis   . Depression   . Menopausal symptom   . Migraine   . Scoliosis     Current Outpatient Medications:  .  ALPRAZolam (XANAX) 0.25 MG tablet, Take 1 tablet (0.25 mg total) by mouth 2 (two) times daily as needed for anxiety., Disp: 60 tablet, Rfl: 5 .  azelastine (ASTELIN) 0.1 % nasal spray, Place 1 spray into both nostrils 2 (two) times daily., Disp: 30 mL, Rfl: 12 .  busPIRone (BUSPAR) 5 MG tablet, TAKE 1 TABLET BY MOUTH TWICE A DAY AS NEEDED, Disp: 180 tablet, Rfl: 0 .  cetirizine (ZYRTEC) 10 MG  tablet, Take 1 tablet (10 mg total) by mouth daily., Disp: 30 tablet, Rfl: 11 .  fluticasone (FLONASE) 50 MCG/ACT nasal spray, SPRAY 2 SPRAYS INTO EACH NOSTRIL EVERY DAY, Disp: 48 mL, Rfl: 2 .  ibuprofen (IBU) 600 MG tablet, TAKE 1 TABLET BY MOUTH EVERY 8 HOURS AS NEEDED FOR MILD OR MODERATE PAIN, Disp: 90 tablet, Rfl: 2 .  ondansetron (ZOFRAN) 4 MG tablet, Take 1 tablet (4 mg total) by mouth every 8 (eight) hours as needed for nausea or vomiting., Disp: 20 tablet, Rfl: 0 .  Probiotic Product (PRO-BIOTIC BLEND) CAPS, Take 1 capsule by mouth daily., Disp: 30 capsule, Rfl: 1 .  venlafaxine XR (EFFEXOR XR) 37.5 MG 24 hr capsule, Take 1 capsule (37.5 mg total) by mouth daily with breakfast., Disp: 90 capsule, Rfl: 1  Assessment/ Plan: 54 y.o. female    1. Urinary frequency UA w/ 3-10 RBC, >10 epi in cells, few bacteria.  Push oral fluids.  Return precautions reviewed. - Urine Culture - Urinalysis, Complete   Start time: 5:37pm End time: 5:44pm  Total time spent on patient care (including telephone call/ virtual visit): 15 minutes  [place], [place] [phone]

## 2020-03-26 LAB — URINALYSIS, COMPLETE
Bilirubin, UA: NEGATIVE
Glucose, UA: NEGATIVE
Ketones, UA: NEGATIVE
Leukocytes,UA: NEGATIVE
Nitrite, UA: NEGATIVE
Protein,UA: NEGATIVE
Specific Gravity, UA: 1.01 (ref 1.005–1.030)
Urobilinogen, Ur: 0.2 mg/dL (ref 0.2–1.0)
pH, UA: 5.5 (ref 5.0–7.5)

## 2020-03-26 LAB — MICROSCOPIC EXAMINATION
Epithelial Cells (non renal): >10 /hpf — AB (ref 0–10)
Renal Epithel, UA: NONE SEEN /hpf

## 2020-03-27 LAB — URINE CULTURE: Organism ID, Bacteria: NO GROWTH

## 2020-03-28 ENCOUNTER — Telehealth: Payer: Self-pay | Admitting: Urology

## 2020-03-28 NOTE — Telephone Encounter (Signed)
Pt. Called wanting to make appt. For recurrent Uti. Appt made for 5/24 at 10:30.

## 2020-03-28 NOTE — Telephone Encounter (Signed)
Patient requests a return call from nurse.

## 2020-03-29 ENCOUNTER — Other Ambulatory Visit: Payer: Self-pay | Admitting: Family

## 2020-03-29 DIAGNOSIS — J020 Streptococcal pharyngitis: Secondary | ICD-10-CM

## 2020-03-30 ENCOUNTER — Other Ambulatory Visit: Payer: Self-pay | Admitting: Family

## 2020-03-31 ENCOUNTER — Telehealth: Payer: Self-pay | Admitting: Family

## 2020-03-31 NOTE — Telephone Encounter (Signed)
Aware.Can take B-12 and ask for blood work at next check up/

## 2020-04-02 ENCOUNTER — Ambulatory Visit: Payer: Medicare HMO | Admitting: Urology

## 2020-04-03 ENCOUNTER — Telehealth: Payer: Self-pay | Admitting: Family

## 2020-04-03 NOTE — Telephone Encounter (Signed)
There was no UTI on 03/25/2020 when she say Dr. Lajuana Ripple. If she feels she has one now and needs antibiotics she needs an appointment. If we don't have anything available, I suggest urgent care.

## 2020-04-03 NOTE — Telephone Encounter (Signed)
Pt called stating that she feels like she has a UTI so she went to the store and bought some test strips and said the test strips showed that she does have UTI. Pt is requesting that provider prescribe her an antibiotic.  (Last saw Dr Lajuana Ripple on 03/25/20 for UTI)

## 2020-04-03 NOTE — Telephone Encounter (Signed)
Tele visit

## 2020-04-03 NOTE — Telephone Encounter (Signed)
Covering pcp please advise. 

## 2020-04-04 ENCOUNTER — Encounter: Payer: Self-pay | Admitting: Family Medicine

## 2020-04-04 ENCOUNTER — Ambulatory Visit (INDEPENDENT_AMBULATORY_CARE_PROVIDER_SITE_OTHER): Payer: Medicare Other | Admitting: Family Medicine

## 2020-04-04 DIAGNOSIS — R531 Weakness: Secondary | ICD-10-CM | POA: Diagnosis not present

## 2020-04-04 LAB — URINALYSIS, COMPLETE
Bilirubin, UA: NEGATIVE
Glucose, UA: NEGATIVE
Ketones, UA: NEGATIVE
Leukocytes,UA: NEGATIVE
Nitrite, UA: NEGATIVE
Protein,UA: NEGATIVE
Specific Gravity, UA: 1.025 (ref 1.005–1.030)
Urobilinogen, Ur: 0.2 mg/dL (ref 0.2–1.0)
pH, UA: 5.5 (ref 5.0–7.5)

## 2020-04-04 LAB — MICROSCOPIC EXAMINATION: RBC, Urine: NONE SEEN /hpf (ref 0–2)

## 2020-04-04 NOTE — Progress Notes (Signed)
Virtual Visit via Telephone Note  I connected with Sandra Adams on 04/04/20 at 11:11 AM by telephone and verified that I am speaking with the correct person using two identifiers. Sandra Adams is currently located at home and nobody is currently with her during this visit. The provider, Loman Brooklyn, FNP is located in their home at time of visit.  I discussed the limitations, risks, security and privacy concerns of performing an evaluation and management service by telephone and the availability of in person appointments. I also discussed with the patient that there may be a patient responsible charge related to this service. The patient expressed understanding and agreed to proceed.  Subjective: PCP: Sharion Balloon, FNP  Chief Complaint  Patient presents with   Urinary Tract Infection   Patient reports she feels weak. She states she took an OTC urine test and it was positive. Denies dysuria, back pain, abdominal pain, and fever. Feels like she has an infection starting.    ROS: Per HPI  Current Outpatient Medications:    ALPRAZolam (XANAX) 0.25 MG tablet, Take 1 tablet (0.25 mg total) by mouth 2 (two) times daily as needed for anxiety., Disp: 60 tablet, Rfl: 5   azelastine (ASTELIN) 0.1 % nasal spray, Place 1 spray into both nostrils 2 (two) times daily., Disp: 30 mL, Rfl: 12   busPIRone (BUSPAR) 5 MG tablet, TAKE 1 TABLET BY MOUTH TWICE A DAY AS NEEDED, Disp: 180 tablet, Rfl: 0   cetirizine (ZYRTEC) 10 MG tablet, Take 1 tablet (10 mg total) by mouth daily., Disp: 30 tablet, Rfl: 11   fluticasone (FLONASE) 50 MCG/ACT nasal spray, SPRAY 2 SPRAYS INTO EACH NOSTRIL EVERY DAY, Disp: 48 mL, Rfl: 3   ibuprofen (ADVIL) 600 MG tablet, TAKE 1 TABLET BY MOUTH EVERY 8 HOURS AS NEEDED FOR MILD OR MODERATE PAIN, Disp: 90 tablet, Rfl: 0   ondansetron (ZOFRAN) 4 MG tablet, Take 1 tablet (4 mg total) by mouth every 8 (eight) hours as needed for nausea or vomiting., Disp: 20 tablet,  Rfl: 0   Probiotic Product (PRO-BIOTIC BLEND) CAPS, Take 1 capsule by mouth daily., Disp: 30 capsule, Rfl: 1   venlafaxine XR (EFFEXOR XR) 37.5 MG 24 hr capsule, Take 1 capsule (37.5 mg total) by mouth daily with breakfast., Disp: 90 capsule, Rfl: 1  Allergies  Allergen Reactions   Ciprofloxacin     Makes her feel weak,dizzy   Imitrex [Sumatriptan]     Blurred vision    Prednisone Nausea And Vomiting   Tylenol [Acetaminophen]     Tylenol #3,Headaches   Vicodin [Hydrocodone-Acetaminophen]     GI upset   Past Medical History:  Diagnosis Date   Anxiety    Arthritis    Depression    Menopausal symptom    Migraine    Scoliosis     Observations/Objective: A&O  No respiratory distress or wheezing audible over the phone Mood, judgement, and thought processes all WNL   Assessment and Plan: 1. Weakness - Assessing for UTI per patient's request.  - Urinalysis, Complete - Urine Culture   Follow Up Instructions:  I discussed the assessment and treatment plan with the patient. The patient was provided an opportunity to ask questions and all were answered. The patient agreed with the plan and demonstrated an understanding of the instructions.   The patient was advised to call back or seek an in-person evaluation if the symptoms worsen or if the condition fails to improve as anticipated.  The above  assessment and management plan was discussed with the patient. The patient verbalized understanding of and has agreed to the management plan. Patient is aware to call the clinic if symptoms persist or worsen. Patient is aware when to return to the clinic for a follow-up visit. Patient educated on when it is appropriate to go to the emergency department.   Time call ended: 11:15 AM  I provided 6 minutes of non-face-to-face time during this encounter.  Hendricks Limes, MSN, APRN, FNP-C [facility] 04/04/20

## 2020-04-06 LAB — URINE CULTURE: Organism ID, Bacteria: NO GROWTH

## 2020-04-11 ENCOUNTER — Encounter: Payer: Medicare Other | Admitting: Family

## 2020-04-14 ENCOUNTER — Ambulatory Visit: Payer: Medicare Other | Admitting: Urology

## 2020-04-29 ENCOUNTER — Ambulatory Visit: Payer: Medicare Other | Admitting: Family

## 2020-04-29 ENCOUNTER — Telehealth: Payer: Self-pay | Admitting: Family

## 2020-04-29 ENCOUNTER — Ambulatory Visit: Payer: Medicare Other | Admitting: Family Medicine

## 2020-04-29 ENCOUNTER — Encounter: Payer: Medicare Other | Admitting: Family

## 2020-04-29 NOTE — Telephone Encounter (Signed)
Appointment made. Patient aware. 

## 2020-05-01 ENCOUNTER — Other Ambulatory Visit: Payer: Self-pay | Admitting: Family

## 2020-05-08 DIAGNOSIS — K0889 Other specified disorders of teeth and supporting structures: Secondary | ICD-10-CM | POA: Diagnosis not present

## 2020-05-08 DIAGNOSIS — R220 Localized swelling, mass and lump, head: Secondary | ICD-10-CM | POA: Diagnosis not present

## 2020-05-12 ENCOUNTER — Ambulatory Visit: Payer: Medicare Other | Admitting: Urology

## 2020-05-20 ENCOUNTER — Telehealth: Payer: Self-pay | Admitting: Family

## 2020-05-20 NOTE — Telephone Encounter (Signed)
FYI: Pt called stating that her urine is a dark yellow. Wanted to know if she should be concerned. Pt said it does not burn when she urinates and is not having any other issues. Pt also says that she does drink a lot of dark sodas/ sugary drinks. Pt was advised to try drinking water only today to see if that helps clear up urine. Pt will call us back tomorrow for update.

## 2020-05-20 NOTE — Telephone Encounter (Signed)
Will wait for pt to call back with update tomorrow.

## 2020-05-24 ENCOUNTER — Other Ambulatory Visit: Payer: Self-pay | Admitting: Family

## 2020-05-26 ENCOUNTER — Other Ambulatory Visit: Payer: Self-pay | Admitting: Family

## 2020-05-26 DIAGNOSIS — F331 Major depressive disorder, recurrent, moderate: Secondary | ICD-10-CM

## 2020-05-26 DIAGNOSIS — F411 Generalized anxiety disorder: Secondary | ICD-10-CM

## 2020-06-05 ENCOUNTER — Other Ambulatory Visit: Payer: Medicare Other | Admitting: Adult Health

## 2020-06-10 ENCOUNTER — Ambulatory Visit: Payer: Medicare Other | Admitting: Obstetrics & Gynecology

## 2020-06-16 DIAGNOSIS — K0889 Other specified disorders of teeth and supporting structures: Secondary | ICD-10-CM | POA: Diagnosis not present

## 2020-06-18 DIAGNOSIS — Z1231 Encounter for screening mammogram for malignant neoplasm of breast: Secondary | ICD-10-CM | POA: Diagnosis not present

## 2020-06-26 ENCOUNTER — Encounter: Payer: Medicare Other | Admitting: Family

## 2020-06-27 ENCOUNTER — Other Ambulatory Visit: Payer: Self-pay | Admitting: Family

## 2020-06-27 DIAGNOSIS — F331 Major depressive disorder, recurrent, moderate: Secondary | ICD-10-CM

## 2020-06-27 DIAGNOSIS — F411 Generalized anxiety disorder: Secondary | ICD-10-CM

## 2020-06-28 ENCOUNTER — Other Ambulatory Visit: Payer: Self-pay | Admitting: Family

## 2020-07-07 ENCOUNTER — Encounter: Payer: Medicare Other | Admitting: Adult Health

## 2020-07-12 ENCOUNTER — Other Ambulatory Visit: Payer: Self-pay | Admitting: Family

## 2020-07-12 DIAGNOSIS — F411 Generalized anxiety disorder: Secondary | ICD-10-CM

## 2020-07-12 DIAGNOSIS — E663 Overweight: Secondary | ICD-10-CM

## 2020-07-14 ENCOUNTER — Telehealth: Payer: Self-pay | Admitting: Family

## 2020-07-18 ENCOUNTER — Ambulatory Visit: Payer: Medicare Other | Admitting: Family

## 2020-07-31 ENCOUNTER — Encounter: Payer: Self-pay | Admitting: Family

## 2020-07-31 ENCOUNTER — Other Ambulatory Visit: Payer: Self-pay

## 2020-07-31 ENCOUNTER — Ambulatory Visit (INDEPENDENT_AMBULATORY_CARE_PROVIDER_SITE_OTHER): Payer: Medicare Other | Admitting: Family

## 2020-07-31 ENCOUNTER — Other Ambulatory Visit: Payer: Self-pay | Admitting: Family

## 2020-07-31 VITALS — BP 121/69 | HR 74 | Temp 97.4°F | Ht 62.0 in | Wt 149.4 lb

## 2020-07-31 DIAGNOSIS — Z79899 Other long term (current) drug therapy: Secondary | ICD-10-CM | POA: Diagnosis not present

## 2020-07-31 DIAGNOSIS — Z1211 Encounter for screening for malignant neoplasm of colon: Secondary | ICD-10-CM

## 2020-07-31 DIAGNOSIS — F331 Major depressive disorder, recurrent, moderate: Secondary | ICD-10-CM

## 2020-07-31 DIAGNOSIS — E663 Overweight: Secondary | ICD-10-CM

## 2020-07-31 DIAGNOSIS — K219 Gastro-esophageal reflux disease without esophagitis: Secondary | ICD-10-CM

## 2020-07-31 DIAGNOSIS — M8949 Other hypertrophic osteoarthropathy, multiple sites: Secondary | ICD-10-CM | POA: Diagnosis not present

## 2020-07-31 DIAGNOSIS — R1030 Lower abdominal pain, unspecified: Secondary | ICD-10-CM | POA: Diagnosis not present

## 2020-07-31 DIAGNOSIS — G43009 Migraine without aura, not intractable, without status migrainosus: Secondary | ICD-10-CM

## 2020-07-31 DIAGNOSIS — F411 Generalized anxiety disorder: Secondary | ICD-10-CM

## 2020-07-31 DIAGNOSIS — M545 Low back pain: Secondary | ICD-10-CM

## 2020-07-31 DIAGNOSIS — G8929 Other chronic pain: Secondary | ICD-10-CM

## 2020-07-31 DIAGNOSIS — M159 Polyosteoarthritis, unspecified: Secondary | ICD-10-CM

## 2020-07-31 LAB — MICROSCOPIC EXAMINATION
Bacteria, UA: NONE SEEN
Epithelial Cells (non renal): NONE SEEN /hpf (ref 0–10)
RBC, Urine: NONE SEEN /hpf (ref 0–2)

## 2020-07-31 LAB — URINALYSIS, COMPLETE
Bilirubin, UA: NEGATIVE
Glucose, UA: NEGATIVE
Ketones, UA: NEGATIVE
Leukocytes,UA: NEGATIVE
Nitrite, UA: NEGATIVE
Protein,UA: NEGATIVE
Specific Gravity, UA: 1.025 (ref 1.005–1.030)
Urobilinogen, Ur: 0.2 mg/dL (ref 0.2–1.0)
pH, UA: 5 (ref 5.0–7.5)

## 2020-07-31 MED ORDER — ALPRAZOLAM 0.25 MG PO TABS: 0.2500 mg | ORAL_TABLET | Freq: Two times a day (BID) | ORAL | 5 refills | Status: DC | PRN

## 2020-07-31 MED ORDER — TRAMADOL HCL 50 MG PO TABS: 50.0000 mg | ORAL_TABLET | Freq: Four times a day (QID) | ORAL | 2 refills | Status: DC | PRN

## 2020-07-31 NOTE — Patient Instructions (Signed)
Health Maintenance, Female Adopting a healthy lifestyle and getting preventive care are important in promoting health and wellness. Ask your health care provider about:  The right schedule for you to have regular tests and exams.  Things you can do on your own to prevent diseases and keep yourself healthy. What should I know about diet, weight, and exercise? Eat a healthy diet   Eat a diet that includes plenty of vegetables, fruits, low-fat dairy products, and lean protein.  Do not eat a lot of foods that are high in solid fats, added sugars, or sodium. Maintain a healthy weight Body mass index (BMI) is used to identify weight problems. It estimates body fat based on height and weight. Your health care provider can help determine your BMI and help you achieve or maintain a healthy weight. Get regular exercise Get regular exercise. This is one of the most important things you can do for your health. Most adults should:  Exercise for at least 150 minutes each week. The exercise should increase your heart rate and make you sweat (moderate-intensity exercise).  Do strengthening exercises at least twice a week. This is in addition to the moderate-intensity exercise.  Spend less time sitting. Even light physical activity can be beneficial. Watch cholesterol and blood lipids Have your blood tested for lipids and cholesterol at 56 years of age, then have this test every 5 years. Have your cholesterol levels checked more often if:  Your lipid or cholesterol levels are high.  You are older than 56 years of age.  You are at high risk for heart disease. What should I know about cancer screening? Depending on your health history and family history, you may need to have cancer screening at various ages. This may include screening for:  Breast cancer.  Cervical cancer.  Colorectal cancer.  Skin cancer.  Lung cancer. What should I know about heart disease, diabetes, and high blood  pressure? Blood pressure and heart disease  High blood pressure causes heart disease and increases the risk of stroke. This is more likely to develop in people who have high blood pressure readings, are of African descent, or are overweight.  Have your blood pressure checked: ? Every 3-5 years if you are 18-39 years of age. ? Every year if you are 40 years old or older. Diabetes Have regular diabetes screenings. This checks your fasting blood sugar level. Have the screening done:  Once every three years after age 40 if you are at a normal weight and have a low risk for diabetes.  More often and at a younger age if you are overweight or have a high risk for diabetes. What should I know about preventing infection? Hepatitis B If you have a higher risk for hepatitis B, you should be screened for this virus. Talk with your health care provider to find out if you are at risk for hepatitis B infection. Hepatitis C Testing is recommended for:  Everyone born from 1945 through 1965.  Anyone with known risk factors for hepatitis C. Sexually transmitted infections (STIs)  Get screened for STIs, including gonorrhea and chlamydia, if: ? You are sexually active and are younger than 56 years of age. ? You are older than 56 years of age and your health care provider tells you that you are at risk for this type of infection. ? Your sexual activity has changed since you were last screened, and you are at increased risk for chlamydia or gonorrhea. Ask your health care provider if   you are at risk.  Ask your health care provider about whether you are at high risk for HIV. Your health care provider may recommend a prescription medicine to help prevent HIV infection. If you choose to take medicine to prevent HIV, you should first get tested for HIV. You should then be tested every 3 months for as long as you are taking the medicine. Pregnancy  If you are about to stop having your period (premenopausal) and  you may become pregnant, seek counseling before you get pregnant.  Take 400 to 800 micrograms (mcg) of folic acid every day if you become pregnant.  Ask for birth control (contraception) if you want to prevent pregnancy. Osteoporosis and menopause Osteoporosis is a disease in which the bones lose minerals and strength with aging. This can result in bone fractures. If you are 65 years old or older, or if you are at risk for osteoporosis and fractures, ask your health care provider if you should:  Be screened for bone loss.  Take a calcium or vitamin D supplement to lower your risk of fractures.  Be given hormone replacement therapy (HRT) to treat symptoms of menopause. Follow these instructions at home: Lifestyle  Do not use any products that contain nicotine or tobacco, such as cigarettes, e-cigarettes, and chewing tobacco. If you need help quitting, ask your health care provider.  Do not use street drugs.  Do not share needles.  Ask your health care provider for help if you need support or information about quitting drugs. Alcohol use  Do not drink alcohol if: ? Your health care provider tells you not to drink. ? You are pregnant, may be pregnant, or are planning to become pregnant.  If you drink alcohol: ? Limit how much you use to 0-1 drink a day. ? Limit intake if you are breastfeeding.  Be aware of how much alcohol is in your drink. In the [place] one drink equals one 12 oz bottle of beer (355 mL), one 5 oz glass of wine (148 mL), or one 1 oz glass of hard liquor (44 mL). General instructions  Schedule regular health, dental, and eye exams.  Stay current with your vaccines.  Tell your health care provider if: ? You often feel depressed. ? You have ever been abused or do not feel safe at home. Summary  Adopting a healthy lifestyle and getting preventive care are important in promoting health and wellness.  Follow your health care provider's instructions about healthy  diet, exercising, and getting tested or screened for diseases.  Follow your health care provider's instructions on monitoring your cholesterol and blood pressure. This information is not intended to replace advice given to you by your health care provider. Make sure you discuss any questions you have with your health care provider. Document Revised: 11/29/2018 Document Reviewed: 11/29/2018 Elsevier Patient Education  2020 [employer]vier Inc[employer]  

## 2020-07-31 NOTE — Progress Notes (Signed)
Subjective:    Patient ID: [name], female    DOB: 10-26-1966, 54 y.o.   MRN: [id number]  Chief Complaint  Patient presents with  . Medical Management of Chronic Issues    wants u/s for fibroids    Pt presents to the office today for chronic follow up. She is followed by GYN annually.  Gastroesophageal Reflux She complains of heartburn. This is a chronic problem. The current episode started more than 1 year ago. The problem occurs occasionally. She has tried a PPI for the symptoms. The treatment provided moderate relief.  Headache  This is a chronic problem. The current episode started more than 1 year ago. Episode frequency: once a month. The problem has been gradually improving. The pain quality is similar to prior headaches. The pain is mild. Associated symptoms include back pain. Pertinent negatives include no phonophobia or photophobia. The symptoms are aggravated by emotional stress. She has tried Excedrin for the symptoms. The treatment provided mild relief.  Back Pain This is a chronic problem. The current episode started more than 1 year ago. The problem occurs intermittently. The problem has been waxing and waning since onset. The quality of the pain is described as aching. The pain is at a severity of 3/10. The pain is mild.  Anxiety Presents for follow-up visit. Symptoms include depressed mood, excessive worry and restlessness. The quality of sleep is good.    Depression        This is a chronic problem.  The current episode started more than 1 year ago.   The onset quality is gradual.   Associated symptoms include irritable, restlessness and sad.  Associated symptoms include no helplessness and no hopelessness.  Past treatments include SNRIs - Serotonin and norepinephrine reuptake inhibitors.  Past medical history includes anxiety.       Review of Systems  Eyes: Negative for photophobia.  Gastrointestinal: Positive for heartburn.  Musculoskeletal: Positive for back  pain.  Psychiatric/Behavioral: Positive for depression.  All other systems reviewed and are negative.      Objective:   Physical Exam Vitals reviewed.  Constitutional:      General: She is irritable. She is not in acute distress.    Appearance: She is well-developed.  HENT:     Head: Normocephalic and atraumatic.     Right Ear: Tympanic membrane normal.     Left Ear: Tympanic membrane normal.  Eyes:     Pupils: Pupils are equal, round, and reactive to light.  Neck:     Thyroid: No thyromegaly.  Cardiovascular:     Rate and Rhythm: Normal rate and regular rhythm.     Heart sounds: Normal heart sounds. No murmur heard.   Pulmonary:     Effort: Pulmonary effort is normal. No respiratory distress.     Breath sounds: Normal breath sounds. No wheezing.  Abdominal:     General: Bowel sounds are normal. There is no distension.     Palpations: Abdomen is soft.     Tenderness: There is no abdominal tenderness.  Musculoskeletal:        General: No tenderness. Normal range of motion.     Cervical back: Normal range of motion and neck supple.  Skin:    General: Skin is warm and dry.  Neurological:     Mental Status: She is alert and oriented to person, place, and time.     Cranial Nerves: No cranial nerve deficit.     Deep Tendon Reflexes: Reflexes are normal  and symmetric.  Psychiatric:        Behavior: Behavior normal.        Thought Content: Thought content normal.        Judgment: Judgment normal.       BP 121/69   Pulse 74   Temp (!) 97.4 F (36.3 C) (Temporal)   Ht _0  (1.575 m)   Wt 149 lb 6.4 oz (67.8 kg)   LMP 10/16/2014   SpO2 98%   BMI 27.33 kg/m      Assessment & Plan:  TRISH MANCINELLI comes in today with chief complaint of Medical Management of Chronic Issues (wants u/s for fibroids )   Diagnosis and orders addressed:  1. Gastroesophageal reflux disease without esophagitis - CMP14+EGFR - CBC with Differential/Platelet  2. Primary  osteoarthritis involving multiple joints - CMP14+EGFR - CBC with Differential/Platelet  3. GAD (generalized anxiety disorder) - CMP14+EGFR - CBC with Differential/Platelet - ALPRAZolam (XANAX) 0.25 MG tablet; Take 1 tablet (0.25 mg total) by mouth 2 (two) times daily as needed for anxiety.  Dispense: 45 tablet; Refill: 5  4. Overweight (BMI 25.0-29.9) - CMP14+EGFR - CBC with Differential/Platelet - TSH - ALPRAZolam (XANAX) 0.25 MG tablet; Take 1 tablet (0.25 mg total) by mouth 2 (two) times daily as needed for anxiety.  Dispense: 45 tablet; Refill: 5  5. Migraine without aura and without status migrainosus, not intractable - CMP14+EGFR - CBC with Differential/Platelet - TSH  6. Controlled substance agreement signed - CMP14+EGFR - CBC with Differential/Platelet - ALPRAZolam (XANAX) 0.25 MG tablet; Take 1 tablet (0.25 mg total) by mouth 2 (two) times daily as needed for anxiety.  Dispense: 45 tablet; Refill: 5  7. Chronic bilateral low back pain without sciatica - CMP14+EGFR - CBC with Differential/Platelet - traMADol (ULTRAM) 50 MG tablet; Take 1 tablet (50 mg total) by mouth every 6 (six) hours as needed.  Dispense: 30 tablet; Refill: 2  8. Moderate episode of recurrent major depressive disorder (HCC) - CMP14+EGFR - CBC with Differential/Platelet  9. Lower abdominal pain - CMP14+EGFR - CBC with Differential/Platelet - Urinalysis, Complete  10. Colon cancer screening  - Cologuard   Labs pending Health Maintenance reviewed Diet and exercise encouraged  Follow up plan: 6 months    [name], FNP

## 2020-08-01 ENCOUNTER — Telehealth: Payer: Self-pay | Admitting: Family

## 2020-08-01 LAB — CMP14+EGFR
ALT: 48 IU/L — ABNORMAL HIGH (ref 0–32)
AST: 31 IU/L (ref 0–40)
Albumin/Globulin Ratio: 1.7 (ref 1.2–2.2)
Albumin: 4.5 g/dL (ref 3.8–4.9)
Alkaline Phosphatase: 147 IU/L — ABNORMAL HIGH (ref 48–121)
BUN/Creatinine Ratio: 9 (ref 9–23)
BUN: 7 mg/dL (ref 6–24)
Bilirubin Total: 0.4 mg/dL (ref 0.0–1.2)
CO2: 26 mmol/L (ref 20–29)
Calcium: 9.7 mg/dL (ref 8.7–10.2)
Chloride: 101 mmol/L (ref 96–106)
Creatinine, Ser: 0.74 mg/dL (ref 0.57–1.00)
GFR calc Af Amer: 105 mL/min/{1.73_m2} (ref >59)
GFR calc non Af Amer: 91 mL/min/{1.73_m2} (ref >59)
Globulin, Total: 2.7 g/dL (ref 1.5–4.5)
Glucose: 86 mg/dL (ref 65–99)
Potassium: 4 mmol/L (ref 3.5–5.2)
Sodium: 141 mmol/L (ref 134–144)
Total Protein: 7.2 g/dL (ref 6.0–8.5)

## 2020-08-01 LAB — CBC WITH DIFFERENTIAL/PLATELET
Basophils Absolute: 0.1 10*3/uL (ref 0.0–0.2)
Basos: 1 %
EOS (ABSOLUTE): 0.2 10*3/uL (ref 0.0–0.4)
Eos: 2 %
Hematocrit: 43.1 % (ref 34.0–46.6)
Hemoglobin: 14.3 g/dL (ref 11.1–15.9)
Immature Grans (Abs): 0 10*3/uL (ref 0.0–0.1)
Immature Granulocytes: 0 %
Lymphocytes Absolute: 3.8 10*3/uL — ABNORMAL HIGH (ref 0.7–3.1)
Lymphs: 40 %
MCH: 31.6 pg (ref 26.6–33.0)
MCHC: 33.2 g/dL (ref 31.5–35.7)
MCV: 95 fL (ref 79–97)
Monocytes Absolute: 0.6 10*3/uL (ref 0.1–0.9)
Monocytes: 6 %
Neutrophils Absolute: 4.8 10*3/uL (ref 1.4–7.0)
Neutrophils: 51 %
Platelets: 231 10*3/uL (ref 150–450)
RBC: 4.53 x10E6/uL (ref 3.77–5.28)
RDW: 12.3 % (ref 11.7–15.4)
WBC: 9.6 10*3/uL (ref 3.4–10.8)

## 2020-08-01 LAB — TSH: TSH: 1.55 u[IU]/mL (ref 0.450–4.500)

## 2020-08-01 NOTE — Telephone Encounter (Signed)
Pt wanted to review labs again = went over

## 2020-08-04 ENCOUNTER — Other Ambulatory Visit: Payer: Self-pay | Admitting: Family

## 2020-08-04 LAB — TOXASSURE SELECT 13 (MW), URINE

## 2020-08-05 ENCOUNTER — Telehealth: Payer: Self-pay | Admitting: Family

## 2020-08-05 NOTE — Telephone Encounter (Signed)
Aware of results per result note

## 2020-08-07 ENCOUNTER — Ambulatory Visit: Payer: Medicare Other | Admitting: Family

## 2020-08-11 ENCOUNTER — Ambulatory Visit: Payer: Medicare Other | Admitting: Adult Health

## 2020-08-11 DIAGNOSIS — R300 Dysuria: Secondary | ICD-10-CM | POA: Diagnosis not present

## 2020-08-11 DIAGNOSIS — R8290 Unspecified abnormal findings in urine: Secondary | ICD-10-CM | POA: Diagnosis not present

## 2020-08-11 DIAGNOSIS — R82998 Other abnormal findings in urine: Secondary | ICD-10-CM | POA: Diagnosis not present

## 2020-08-12 ENCOUNTER — Encounter: Payer: Self-pay | Admitting: Family

## 2020-08-12 ENCOUNTER — Ambulatory Visit (INDEPENDENT_AMBULATORY_CARE_PROVIDER_SITE_OTHER): Payer: Medicare Other | Admitting: Family

## 2020-08-12 DIAGNOSIS — B373 Candidiasis of vulva and vagina: Secondary | ICD-10-CM | POA: Diagnosis not present

## 2020-08-12 DIAGNOSIS — B3731 Acute candidiasis of vulva and vagina: Secondary | ICD-10-CM

## 2020-08-12 MED ORDER — FLUCONAZOLE 150 MG PO TABS: 150.0000 mg | ORAL_TABLET | ORAL | 0 refills | Status: DC | PRN

## 2020-08-12 NOTE — Progress Notes (Signed)
   Virtual Visit via telephone Note Due to COVID-19 pandemic this visit was conducted virtually. This visit type was conducted due to national recommendations for restrictions regarding the COVID-19 Pandemic (e.g. social distancing, sheltering in place) in an effort to limit this patient's exposure and mitigate transmission in our community. All issues noted in this document were discussed and addressed.  A physical exam was not performed with this format.  I connected with Sandra Adams on 08/12/20 at 12:40 pm by telephone and verified that I am speaking with the correct person using two identifiers. Sandra Adams is currently located at home and no one is currently with her during visit. The provider, [name], FNP is located in their office at time of visit.  I discussed the limitations, risks, security and privacy concerns of performing an evaluation and management service by telephone and the availability of in person appointments. I also discussed with the patient that there may be a patient responsible charge related to this service. The patient expressed understanding and agreed to proceed.   History and Present Illness:  PT was seen in the Urgent Care yesterday for UTI. She was started on macrobid 100 mg BID. She reports she is having vaginal itching.   Vaginal Itching The patient's primary symptoms include genital itching. This is a new problem. The current episode started yesterday. The problem occurs intermittently. The problem has been unchanged. Associated symptoms include dysuria and frequency. She has tried nothing for the symptoms. The treatment provided no relief.     Review of Systems  Genitourinary: Positive for dysuria and frequency.  All other systems reviewed and are negative.    Observations/Objective: No SOB or distress noted  Assessment and Plan: 1. Vagina, candidiasis Keep clean and dry Do not itch RTO if symptoms worsen or do improve -  fluconazole (DIFLUCAN) 150 MG tablet; Take 1 tablet (150 mg total) by mouth every three (3) days as needed.  Dispense: 3 tablet; Refill: 0     I discussed the assessment and treatment plan with the patient. The patient was provided an opportunity to ask questions and all were answered. The patient agreed with the plan and demonstrated an understanding of the instructions.   The patient was advised to call back or seek an in-person evaluation if the symptoms worsen or if the condition fails to improve as anticipated.  The above assessment and management plan was discussed with the patient. The patient verbalized understanding of and has agreed to the management plan. Patient is aware to call the clinic if symptoms persist or worsen. Patient is aware when to return to the clinic for a follow-up visit. Patient educated on when it is appropriate to go to the emergency department.   Time call ended: 12:48 pm     I provided 8 minutes of non-face-to-face time during this encounter.    [name], FNP

## 2020-08-18 ENCOUNTER — Ambulatory Visit: Payer: Medicare Other | Admitting: Adult Health

## 2020-08-22 ENCOUNTER — Ambulatory Visit (INDEPENDENT_AMBULATORY_CARE_PROVIDER_SITE_OTHER): Payer: Medicare Other | Admitting: Family

## 2020-08-22 ENCOUNTER — Encounter: Payer: Self-pay | Admitting: Family

## 2020-08-22 ENCOUNTER — Other Ambulatory Visit: Payer: Self-pay

## 2020-08-22 DIAGNOSIS — R350 Frequency of micturition: Secondary | ICD-10-CM | POA: Diagnosis not present

## 2020-08-22 NOTE — Progress Notes (Signed)
° °  Virtual Visit via telephone Note Due to COVID-19 pandemic this visit was conducted virtually. This visit type was conducted due to national recommendations for restrictions regarding the COVID-19 Pandemic (e.g. social distancing, sheltering in place) in an effort to limit this patient's exposure and mitigate transmission in our community. All issues noted in this document were discussed and addressed.  A physical exam was not performed with this format.  I connected with Sandra Adams on 08/22/20 at 2:11 pm  by telephone and verified that I am speaking with the correct person using two identifiers. Sandra Adams is currently located at home and no one is currently with her during visit. The provider, [name], FNP is located in their office at time of visit.  I discussed the limitations, risks, security and privacy concerns of performing an evaluation and management service by telephone and the availability of in person appointments. I also discussed with the patient that there may be a patient responsible charge related to this service. The patient expressed understanding and agreed to proceed.   History and Present Illness:  Urinary Frequency  This is a recurrent problem. The current episode started in the past 7 days. The problem occurs intermittently. The problem has been waxing and waning. The pain is at a severity of 0/10. The patient is experiencing no pain. Associated symptoms include frequency. Pertinent negatives include no flank pain, hematuria, nausea, urgency or vomiting. She has tried increased fluids for the symptoms.      Review of Systems  Gastrointestinal: Negative for nausea and vomiting.  Genitourinary: Positive for frequency. Negative for flank pain, hematuria and urgency.  All other systems reviewed and are negative.    Observations/Objective: No SOB or distress noted   Assessment and Plan: 1. Urinary frequency Force fluids AZO over the counter X2  days RTO if symptoms worsen or do not improve  Culture pending - Urinalysis, Complete - Urine Culture   Follow Up Instructions: As needed or if symptoms worsen    I discussed the assessment and treatment plan with the patient. The patient was provided an opportunity to ask questions and all were answered. The patient agreed with the plan and demonstrated an understanding of the instructions.   The patient was advised to call back or seek an in-person evaluation if the symptoms worsen or if the condition fails to improve as anticipated.  The above assessment and management plan was discussed with the patient. The patient verbalized understanding of and has agreed to the management plan. Patient is aware to call the clinic if symptoms persist or worsen. Patient is aware when to return to the clinic for a follow-up visit. Patient educated on when it is appropriate to go to the emergency department.   Time call ended:  2:24 pm   I provided 13 minutes of non-face-to-face time during this encounter.    [name], FNP

## 2020-08-27 ENCOUNTER — Ambulatory Visit: Payer: Medicare Other | Admitting: Adult Health

## 2020-09-10 ENCOUNTER — Telehealth: Payer: Self-pay | Admitting: Family

## 2020-09-11 ENCOUNTER — Encounter: Payer: Self-pay | Admitting: Family

## 2020-09-11 ENCOUNTER — Ambulatory Visit: Payer: Medicare Other | Admitting: Nurse Practitioner

## 2020-09-11 ENCOUNTER — Ambulatory Visit (INDEPENDENT_AMBULATORY_CARE_PROVIDER_SITE_OTHER): Payer: Medicare Other | Admitting: Family

## 2020-09-11 DIAGNOSIS — Z1211 Encounter for screening for malignant neoplasm of colon: Secondary | ICD-10-CM

## 2020-09-11 DIAGNOSIS — R399 Unspecified symptoms and signs involving the genitourinary system: Secondary | ICD-10-CM | POA: Diagnosis not present

## 2020-09-11 DIAGNOSIS — R350 Frequency of micturition: Secondary | ICD-10-CM | POA: Diagnosis not present

## 2020-09-11 LAB — MICROSCOPIC EXAMINATION
Bacteria, UA: NONE SEEN
RBC, Urine: NONE SEEN /hpf (ref 0–2)

## 2020-09-11 LAB — URINALYSIS, COMPLETE
Bilirubin, UA: NEGATIVE
Glucose, UA: NEGATIVE
Ketones, UA: NEGATIVE
Leukocytes,UA: NEGATIVE
Nitrite, UA: NEGATIVE
Protein,UA: NEGATIVE
Specific Gravity, UA: >1.03 — ABNORMAL HIGH (ref 1.005–1.030)
Urobilinogen, Ur: 0.2 mg/dL (ref 0.2–1.0)
pH, UA: 5.5 (ref 5.0–7.5)

## 2020-09-11 NOTE — Progress Notes (Signed)
   Virtual Visit via telephone Note Due to COVID-19 pandemic this visit was conducted virtually. This visit type was conducted due to national recommendations for restrictions regarding the COVID-19 Pandemic (e.g. social distancing, sheltering in place) in an effort to limit this patient's exposure and mitigate transmission in our community. All issues noted in this document were discussed and addressed.  A physical exam was not performed with this format.  I connected with Sandra Adams on 09/11/20 at 12:54 pm  by telephone and verified that I am speaking with the correct person using two identifiers. Sandra Adams is currently located at home and no one is currently with her during visit. The provider, [name], FNP is located in their office at time of visit.  I discussed the limitations, risks, security and privacy concerns of performing an evaluation and management service by telephone and the availability of in person appointments. I also discussed with the patient that there may be a patient responsible charge related to this service. The patient expressed understanding and agreed to proceed.   History and Present Illness:  Urinary Frequency  This is a new problem. The current episode started in the past 7 days. The problem has been waxing and waning. The pain is at a severity of 0/10. The patient is experiencing no pain. Associated symptoms include frequency and urgency. Pertinent negatives include no discharge, flank pain, hematuria, hesitancy, nausea or vomiting. She has tried increased fluids for the symptoms. The treatment provided mild relief.      Review of Systems  Gastrointestinal: Negative for nausea and vomiting.  Genitourinary: Positive for frequency and urgency. Negative for flank pain, hematuria and hesitancy.  All other systems reviewed and are negative.    Observations/Objective: No SOB or distress noted, pt anxious  Assessment and Plan: Sandra Adams  comes in today with chief complaint of No chief complaint on file.   Diagnosis and orders addressed:  1. Colon cancer screening - Ambulatory referral to Gastroenterology  2. UTI symptoms PT will come and leave urine Force fluids AZO over the counter X2 days RTO if symptoms worsen or do not improve Culture pending      I discussed the assessment and treatment plan with the patient. The patient was provided an opportunity to ask questions and all were answered. The patient agreed with the plan and demonstrated an understanding of the instructions.   The patient was advised to call back or seek an in-person evaluation if the symptoms worsen or if the condition fails to improve as anticipated.  The above assessment and management plan was discussed with the patient. The patient verbalized understanding of and has agreed to the management plan. Patient is aware to call the clinic if symptoms persist or worsen. Patient is aware when to return to the clinic for a follow-up visit. Patient educated on when it is appropriate to go to the emergency department.   Time call ended:  1:07 pm  I provided 13 minutes of non-face-to-face time during this encounter.    [name], FNP

## 2020-09-12 LAB — URINE CULTURE

## 2020-09-21 ENCOUNTER — Other Ambulatory Visit: Payer: Self-pay | Admitting: Family Medicine

## 2020-09-21 DIAGNOSIS — J329 Chronic sinusitis, unspecified: Secondary | ICD-10-CM

## 2020-09-23 ENCOUNTER — Encounter: Payer: Medicare Other | Admitting: Family

## 2020-10-01 ENCOUNTER — Other Ambulatory Visit: Payer: Self-pay | Admitting: Family

## 2020-10-16 ENCOUNTER — Other Ambulatory Visit: Payer: Self-pay | Admitting: Family Medicine

## 2020-10-16 DIAGNOSIS — J329 Chronic sinusitis, unspecified: Secondary | ICD-10-CM

## 2020-10-22 ENCOUNTER — Other Ambulatory Visit: Payer: Self-pay | Admitting: Family

## 2020-10-22 DIAGNOSIS — F411 Generalized anxiety disorder: Secondary | ICD-10-CM

## 2020-10-22 DIAGNOSIS — F331 Major depressive disorder, recurrent, moderate: Secondary | ICD-10-CM

## 2020-10-24 ENCOUNTER — Other Ambulatory Visit: Payer: Self-pay | Admitting: Family

## 2020-10-29 ENCOUNTER — Other Ambulatory Visit: Payer: Medicare Other | Admitting: Adult Health

## 2020-10-29 ENCOUNTER — Other Ambulatory Visit: Payer: Medicare Other | Admitting: Women's Health

## 2020-11-06 ENCOUNTER — Telehealth: Payer: Self-pay

## 2020-11-06 ENCOUNTER — Ambulatory Visit: Payer: Medicare Other | Admitting: Family

## 2020-11-06 MED ORDER — FLUTICASONE PROPIONATE 50 MCG/ACT NA SUSP: 2.0000 | Freq: Every day | NASAL | 6 refills | Status: DC

## 2020-11-06 NOTE — Telephone Encounter (Signed)
Prescription sent to pharmacy.

## 2020-11-15 ENCOUNTER — Other Ambulatory Visit: Payer: Self-pay | Admitting: Family

## 2020-11-15 DIAGNOSIS — J329 Chronic sinusitis, unspecified: Secondary | ICD-10-CM

## 2020-11-19 ENCOUNTER — Telehealth: Payer: Self-pay

## 2020-12-04 ENCOUNTER — Encounter: Payer: Self-pay | Admitting: Family

## 2020-12-04 ENCOUNTER — Ambulatory Visit (INDEPENDENT_AMBULATORY_CARE_PROVIDER_SITE_OTHER): Payer: Medicare Other | Admitting: Family

## 2020-12-04 ENCOUNTER — Other Ambulatory Visit: Payer: Self-pay | Admitting: Family

## 2020-12-04 DIAGNOSIS — R300 Dysuria: Secondary | ICD-10-CM | POA: Diagnosis not present

## 2020-12-04 DIAGNOSIS — G4700 Insomnia, unspecified: Secondary | ICD-10-CM | POA: Diagnosis not present

## 2020-12-04 DIAGNOSIS — J069 Acute upper respiratory infection, unspecified: Secondary | ICD-10-CM | POA: Diagnosis not present

## 2020-12-04 LAB — MICROSCOPIC EXAMINATION

## 2020-12-04 LAB — URINALYSIS, COMPLETE
Bilirubin, UA: NEGATIVE
Glucose, UA: NEGATIVE
Ketones, UA: NEGATIVE
Nitrite, UA: NEGATIVE
Protein,UA: NEGATIVE
Specific Gravity, UA: 1.025 (ref 1.005–1.030)
Urobilinogen, Ur: 0.2 mg/dL (ref 0.2–1.0)
pH, UA: 5.5 (ref 5.0–7.5)

## 2020-12-04 MED ORDER — TRAZODONE HCL 50 MG PO TABS: 50.0000 mg | ORAL_TABLET | Freq: Every evening | ORAL | 3 refills | Status: DC | PRN

## 2020-12-04 MED ORDER — FLUTICASONE PROPIONATE 50 MCG/ACT NA SUSP: 2.0000 | Freq: Every day | NASAL | 6 refills | Status: DC

## 2020-12-04 NOTE — Progress Notes (Signed)
Virtual Visit via telephone Note Due to COVID-19 pandemic this visit was conducted virtually. This visit type was conducted due to national recommendations for restrictions regarding the COVID-19 Pandemic (e.g. social distancing, sheltering in place) in an effort to limit this patient's exposure and mitigate transmission in our community. All issues noted in this document were discussed and addressed.  A physical exam was not performed with this format.  I connected with Sandra Adams on 12/04/20 at 10:57 AM  by telephone and verified that I am speaking with the correct person using two identifiers. Sandra Adams is currently located at home and no one is currently with her during visit. The provider, [name], FNP is located in their office at time of visit.  I discussed the limitations, risks, security and privacy concerns of performing an evaluation and management service by telephone and the availability of in person appointments. I also discussed with the patient that there may be a patient responsible charge related to this service. The patient expressed understanding and agreed to proceed.   History and Present Illness:  Pt calls today with multiple issues today. She reports she has "runny nose" and possibly a UTI. She reports she used the at home strips and one was purple.   URI  This is a new problem. The current episode started in the past 7 days. The problem has been waxing and waning. There has been no fever. Associated symptoms include congestion, rhinorrhea and sneezing. Pertinent negatives include no coughing, dysuria, ear pain, nausea, neck pain, sinus pain or sore throat. She has tried antihistamine and increased fluids for the symptoms. The treatment provided moderate relief.  Insomnia Primary symptoms: malaise/fatigue.  The current episode started more than one year. The onset quality is gradual. The problem occurs intermittently. The problem has been waxing and  waning since onset. The treatment provided mild relief.  Dysuria  This is a new problem. The current episode started in the past 7 days. The problem occurs intermittently. The problem has been waxing and waning. Pertinent negatives include no flank pain, hematuria or nausea. She has tried increased fluids for the symptoms. The treatment provided mild relief.      Review of Systems  Constitutional: Positive for malaise/fatigue.  HENT: Positive for congestion, rhinorrhea and sneezing. Negative for ear pain, sinus pain and sore throat.   Respiratory: Negative for cough.   Gastrointestinal: Negative for nausea.  Genitourinary: Negative for dysuria, flank pain and hematuria.  Musculoskeletal: Negative for neck pain.  Psychiatric/Behavioral: The patient has insomnia.   All other systems reviewed and are negative.    Observations/Objective: No SOB or distress noted, pt anxious  Assessment and Plan: Sandra Adams comes in today with chief complaint of No chief complaint on file.   Diagnosis and orders addressed:  1. Viral URI - Take meds as prescribed - Use a cool mist humidifier  -Use saline nose sprays frequently -Force fluids -For any cough or congestion  Use plain Mucinex- regular strength or max strength is fine -For fever or aces or pains- take tylenol or ibuprofen. -Throat lozenges if help -RTO if symptoms worsen or do not improve  - fluticasone (FLONASE) 50 MCG/ACT nasal spray; Place 2 sprays into both nostrils daily.  Dispense: 16 g; Refill: 6  2. Insomnia, unspecified type Will start trazodone  Sleep ritual  - traZODone (DESYREL) 50 MG tablet; Take 1-2 tablets (50-100 mg total) by mouth at bedtime as needed for sleep.  Dispense: 60 tablet; Refill:  3  3. Dysuria - Urinalysis, Complete       I discussed the assessment and treatment plan with the patient. The patient was provided an opportunity to ask questions and all were answered. The patient agreed with the  plan and demonstrated an understanding of the instructions.   The patient was advised to call back or seek an in-person evaluation if the symptoms worsen or if the condition fails to improve as anticipated.  The above assessment and management plan was discussed with the patient. The patient verbalized understanding of and has agreed to the management plan. Patient is aware to call the clinic if symptoms persist or worsen. Patient is aware when to return to the clinic for a follow-up visit. Patient educated on when it is appropriate to go to the emergency department.   Time call ended: 11:20 AM    I provided 23 minutes of non-face-to-face time during this encounter.    [name], FNP

## 2020-12-26 ENCOUNTER — Other Ambulatory Visit: Payer: Self-pay | Admitting: Family

## 2020-12-26 DIAGNOSIS — G4700 Insomnia, unspecified: Secondary | ICD-10-CM

## 2021-01-15 ENCOUNTER — Other Ambulatory Visit: Payer: Self-pay | Admitting: Family

## 2021-01-15 DIAGNOSIS — F331 Major depressive disorder, recurrent, moderate: Secondary | ICD-10-CM

## 2021-01-15 DIAGNOSIS — F411 Generalized anxiety disorder: Secondary | ICD-10-CM

## 2021-01-16 ENCOUNTER — Other Ambulatory Visit: Payer: Self-pay | Admitting: Family

## 2021-01-27 ENCOUNTER — Ambulatory Visit (INDEPENDENT_AMBULATORY_CARE_PROVIDER_SITE_OTHER): Payer: Medicare Other | Admitting: Nurse Practitioner

## 2021-01-27 ENCOUNTER — Encounter: Payer: Self-pay | Admitting: Nurse Practitioner

## 2021-01-27 DIAGNOSIS — Z91199 Patient's noncompliance with other medical treatment and regimen due to unspecified reason: Secondary | ICD-10-CM

## 2021-01-27 DIAGNOSIS — Z5329 Procedure and treatment not carried out because of patient's decision for other reasons: Secondary | ICD-10-CM | POA: Insufficient documentation

## 2021-02-03 ENCOUNTER — Ambulatory Visit: Payer: Medicare Other | Admitting: Family

## 2021-02-03 ENCOUNTER — Telehealth: Payer: Self-pay

## 2021-02-03 NOTE — Telephone Encounter (Signed)
Please schedule her a visit with me. IT can be a telephone visit.

## 2021-02-03 NOTE — Telephone Encounter (Incomplete)
  Prescription Request  02/03/2021  What is the name of the medication or equipment? PT NEEDS YEAST PILL AND FLONASE CALLED INTO PHARMACY. PT HAD APPT TODAY BUT HAD TO CANCEL BECAUSE SHE HAD EMERGENCY DENTIST APPT  Have you contacted your pharmacy to request a refill? (if applicable) NO  Which pharmacy would you like this sent to? [facility]   Patient notified that their request is being sent to the clinical staff for review and that they should receive a response within 2 business days.

## 2021-02-04 NOTE — Telephone Encounter (Signed)
Appt made

## 2021-02-05 ENCOUNTER — Encounter: Payer: Self-pay | Admitting: Family

## 2021-02-05 ENCOUNTER — Other Ambulatory Visit: Payer: Self-pay | Admitting: Family

## 2021-02-05 ENCOUNTER — Ambulatory Visit (INDEPENDENT_AMBULATORY_CARE_PROVIDER_SITE_OTHER): Payer: Medicare Other | Admitting: Family

## 2021-02-05 DIAGNOSIS — M545 Low back pain, unspecified: Secondary | ICD-10-CM

## 2021-02-05 DIAGNOSIS — E663 Overweight: Secondary | ICD-10-CM

## 2021-02-05 DIAGNOSIS — R319 Hematuria, unspecified: Secondary | ICD-10-CM

## 2021-02-05 DIAGNOSIS — M159 Polyosteoarthritis, unspecified: Secondary | ICD-10-CM

## 2021-02-05 DIAGNOSIS — Z79899 Other long term (current) drug therapy: Secondary | ICD-10-CM

## 2021-02-05 DIAGNOSIS — F331 Major depressive disorder, recurrent, moderate: Secondary | ICD-10-CM

## 2021-02-05 DIAGNOSIS — G8929 Other chronic pain: Secondary | ICD-10-CM | POA: Diagnosis not present

## 2021-02-05 DIAGNOSIS — F411 Generalized anxiety disorder: Secondary | ICD-10-CM | POA: Diagnosis not present

## 2021-02-05 DIAGNOSIS — M8949 Other hypertrophic osteoarthropathy, multiple sites: Secondary | ICD-10-CM

## 2021-02-05 DIAGNOSIS — R1030 Lower abdominal pain, unspecified: Secondary | ICD-10-CM

## 2021-02-05 DIAGNOSIS — J069 Acute upper respiratory infection, unspecified: Secondary | ICD-10-CM

## 2021-02-05 DIAGNOSIS — G4700 Insomnia, unspecified: Secondary | ICD-10-CM | POA: Diagnosis not present

## 2021-02-05 DIAGNOSIS — K219 Gastro-esophageal reflux disease without esophagitis: Secondary | ICD-10-CM | POA: Diagnosis not present

## 2021-02-05 LAB — WET PREP FOR TRICH, YEAST, CLUE
Clue Cell Exam: NEGATIVE
Trichomonas Exam: NEGATIVE
Yeast Exam: NEGATIVE

## 2021-02-05 LAB — URINALYSIS, COMPLETE
Bilirubin, UA: NEGATIVE
Glucose, UA: NEGATIVE
Ketones, UA: NEGATIVE
Leukocytes,UA: NEGATIVE
Nitrite, UA: NEGATIVE
Protein,UA: NEGATIVE
Specific Gravity, UA: >1.03 — ABNORMAL HIGH (ref 1.005–1.030)
Urobilinogen, Ur: 0.2 mg/dL (ref 0.2–1.0)
pH, UA: 5 (ref 5.0–7.5)

## 2021-02-05 LAB — MICROSCOPIC EXAMINATION
Bacteria, UA: NONE SEEN
WBC, UA: NONE SEEN /hpf (ref 0–5)

## 2021-02-05 MED ORDER — TRAZODONE HCL 50 MG PO TABS: 50.0000 mg | ORAL_TABLET | Freq: Every evening | ORAL | 2 refills | Status: DC | PRN

## 2021-02-05 MED ORDER — ALPRAZOLAM 0.25 MG PO TABS: 0.2500 mg | ORAL_TABLET | Freq: Two times a day (BID) | ORAL | 5 refills | Status: DC | PRN

## 2021-02-05 MED ORDER — VENLAFAXINE HCL ER 37.5 MG PO CP24
ORAL_CAPSULE | ORAL | 1 refills | Status: DC
Start: 2021-02-05 — End: 2021-08-18

## 2021-02-05 MED ORDER — BUSPIRONE HCL 5 MG PO TABS: 5.0000 mg | ORAL_TABLET | Freq: Two times a day (BID) | ORAL | 2 refills | Status: DC | PRN

## 2021-02-05 MED ORDER — FLUTICASONE PROPIONATE 50 MCG/ACT NA SUSP: 2.0000 | Freq: Every day | NASAL | 6 refills | Status: DC

## 2021-02-05 NOTE — Progress Notes (Signed)
Virtual Visit via telephone Note Due to COVID-19 pandemic this visit was conducted virtually. This visit type was conducted due to national recommendations for restrictions regarding the COVID-19 Pandemic (e.g. social distancing, sheltering in place) in an effort to limit this patient's exposure and mitigate transmission in our community. All issues noted in this document were discussed and addressed.  A physical exam was not performed with this format.  I connected with Sandra Adams on 02/05/21 at 10:16 AM  by telephone and verified that I am speaking with the correct person using two identifiers. Sandra Adams is currently located at home and no one is currently with her during visit. The provider, [name], FNP is located in their office at time of visit.  I discussed the limitations, risks, security and privacy concerns of performing an evaluation and management service by telephone and the availability of in person appointments. I also discussed with the patient that there may be a patient responsible charge related to this service. The patient expressed understanding and agreed to proceed.   History and Present Illness:  Depression        This is a chronic problem.  The current episode started more than 1 year ago.   The onset quality is gradual.   The problem occurs rarely.  Associated symptoms include irritable, restlessness and sad.  Associated symptoms include no hopelessness.  Past treatments include SNRIs - Serotonin and norepinephrine reuptake inhibitors.  Compliance with treatment is good.  Past medical history includes anxiety.   Anxiety Presents for follow-up visit. Symptoms include depressed mood, excessive worry, irritability, nervous/anxious behavior and restlessness. Symptoms occur most days. The severity of symptoms is moderate.    Back Pain This is a chronic problem. The current episode started more than 1 year ago. The problem occurs intermittently. The problem  has been waxing and waning since onset. The pain is present in the lumbar spine. The quality of the pain is described as aching. Associated symptoms include abdominal pain.  Gastroesophageal Reflux She complains of abdominal pain, belching and heartburn. This is a chronic problem. The current episode started more than 1 year ago. The problem occurs occasionally. The problem has been waxing and waning. The symptoms are aggravated by certain foods. Risk factors include obesity. She has tried a PPI and a diet change for the symptoms. The treatment provided mild relief.  Arthritis Presents for follow-up visit. She complains of pain and stiffness. Affected locations include the right knee and left knee.  Abdominal Pain This is a recurrent problem. The current episode started in the past 7 days. The problem occurs intermittently. The pain is located in the suprapubic region. The pain is mild. Associated symptoms include belching. She has tried nothing for the symptoms. The treatment provided no relief. Her past medical history is significant for GERD.      Review of Systems  Constitutional: Positive for irritability.  Gastrointestinal: Positive for abdominal pain and heartburn.  Musculoskeletal: Positive for arthritis, back pain and stiffness.  Psychiatric/Behavioral: Positive for depression. The patient is nervous/anxious.      Observations/Objective: No SOB or distress noted, slightly anxious.   Assessment and Plan: Sandra Adams comes in today with chief complaint of No chief complaint on file.   Diagnosis and orders addressed:  1. Gastroesophageal reflux disease without esophagitis - AMB Referral to [place]  2. Primary osteoarthritis involving multiple joints - AMB Referral to [place]  3. GAD (generalized anxiety disorder) - AMB  Referral to [facility] Coordinaton - venlafaxine XR (EFFEXOR-XR) 37.5 MG 24 hr capsule; TAKE 1 CAPSULE BY MOUTH  DAILY WITH BREAKFAST.  Dispense: 90 capsule; Refill: 1 - busPIRone (BUSPAR) 5 MG tablet; Take 1 tablet (5 mg total) by mouth 2 (two) times daily as needed. (Needs to be seen before next refill)  Dispense: 60 tablet; Refill: 2 - ALPRAZolam (XANAX) 0.25 MG tablet; Take 1 tablet (0.25 mg total) by mouth 2 (two) times daily as needed for anxiety.  Dispense: 45 tablet; Refill: 5  4. Moderate episode of recurrent major depressive disorder (HCC) - AMB Referral to [facility] - venlafaxine XR (EFFEXOR-XR) 37.5 MG 24 hr capsule; TAKE 1 CAPSULE BY MOUTH DAILY WITH BREAKFAST.  Dispense: 90 capsule; Refill: 1 - busPIRone (BUSPAR) 5 MG tablet; Take 1 tablet (5 mg total) by mouth 2 (two) times daily as needed. (Needs to be seen before next refill)  Dispense: 60 tablet; Refill: 2  5. Chronic bilateral low back pain without sciatica - AMB Referral to [facility]  6. Overweight (BMI 25.0-29.9) - AMB Referral to [facility] Coordin[facility] - ALPRAZolam (XANAX) 0.25 MG tablet; Take 1 tablet (0.25 mg total) by mouth 2 (two) times daily as needed for anxiety.  Dispense: 45 tablet; Refill: 5  7. Controlled substance agreement signed - AMB Referral to [place] - ALPRAZolam (XANAX) 0.25 MG tablet; Take 1 tablet (0.25 mg total) by mouth 2 (two) times daily as needed for anxiety.  Dispense: 45 tablet; Refill: 5  8. Insomnia, unspecified type - AMB Referral to [facility] - traZODone (DESYREL) 50 MG tablet; Take 1-2 tablets (50-100 mg total) by mouth at bedtime as needed. for sleep  Dispense: 180 tablet; Refill: 2  9. Lower abdominal pain - Urinalysis, Complete; Future - WET PREP FOR [place], YEAST, CLUE; Future - Urine Culture; Future   Labs pending Patient reviewed in [place] controlled database, no flags noted. Contract and drug screen are up to date.  Health Maintenance reviewed Diet and exercise encouraged  Follow up plan: 3 months for CPE       I discussed the assessment and treatment plan with the patient. The patient was provided an opportunity to ask questions and all were answered. The patient agreed with the plan and demonstrated an understanding of the instructions.   The patient was advised to call back or seek an in-person evaluation if the symptoms worsen or if the condition fails to improve as anticipated.  The above assessment and management plan was discussed with the patient. The patient verbalized understanding of and has agreed to the management plan. Patient is aware to call the clinic if symptoms persist or worsen. Patient is aware when to return to the clinic for a follow-up visit. Patient educated on when it is appropriate to go to the emergency department.   Time call ended:  10:40 AM   I provided 24 minutes of non-face-to-face time during this encounter.    [name], FNP

## 2021-02-05 NOTE — Addendum Note (Signed)
Addended by: Liliane Bade on: 02/05/2021 02:15 PM   Modules accepted: Orders

## 2021-02-07 LAB — URINE CULTURE: Organism ID, Bacteria: NO GROWTH

## 2021-02-09 ENCOUNTER — Ambulatory Visit: Payer: Medicare Other | Admitting: Urology

## 2021-02-18 ENCOUNTER — Telehealth: Payer: Self-pay

## 2021-02-18 NOTE — Chronic Care Management (AMB) (Signed)
  Chronic Care Management   Outreach Note  02/18/2021 Name: Sandra Adams MRN: [id number] DOB: July 04, 1966  Sandra Adams is a 55 y.o. year old female who is a primary care patient of Sharion Balloon, FNP. I reached out to Sandra Adams by phone today in response to a referral sent by Ms. Weston Brass Fayad's PCP, Sharion Balloon, FNP     An unsuccessful telephone outreach was attempted today. The patient was referred to the case management team for assistance with care management and care coordination.   Follow Up Plan: The care management team will reach out to the patient again over the next 5 days.  If patient returns call to provider office, please advise to call [facility]  at [place], [place], [place], [place] [address] Direct Dial: [phone] [email or web address] Website: [email or web address]

## 2021-02-25 ENCOUNTER — Encounter: Payer: Self-pay | Admitting: Family Medicine

## 2021-02-25 ENCOUNTER — Ambulatory Visit (INDEPENDENT_AMBULATORY_CARE_PROVIDER_SITE_OTHER): Payer: Medicare Other | Admitting: Family Medicine

## 2021-02-25 DIAGNOSIS — R350 Frequency of micturition: Secondary | ICD-10-CM

## 2021-02-25 LAB — MICROSCOPIC EXAMINATION: RBC, Urine: NONE SEEN /hpf (ref 0–2)

## 2021-02-25 LAB — URINALYSIS, COMPLETE
Bilirubin, UA: NEGATIVE
Glucose, UA: NEGATIVE
Ketones, UA: NEGATIVE
Leukocytes,UA: NEGATIVE
Nitrite, UA: NEGATIVE
Protein,UA: NEGATIVE
Specific Gravity, UA: 1.025 (ref 1.005–1.030)
Urobilinogen, Ur: 0.2 mg/dL (ref 0.2–1.0)
pH, UA: 5 (ref 5.0–7.5)

## 2021-02-25 NOTE — Progress Notes (Signed)
Virtual Visit via Telephone Note  I connected with Sandra Adams on 02/25/21 at 11:36 AM by telephone and verified that I am speaking with the correct person using two identifiers. Sandra Adams is currently located at home and nobody is currently with her during this visit. The provider, Loman Brooklyn, FNP is located in their office at time of visit.  I discussed the limitations, risks, security and privacy concerns of performing an evaluation and management service by telephone and the availability of in person appointments. I also discussed with the patient that there may be a patient responsible charge related to this service. The patient expressed understanding and agreed to proceed.  Subjective: PCP: Sharion Balloon, FNP  Chief Complaint  Patient presents with  . Urinary Tract Infection   Patient reports urinary frequency.  Denies any other symptoms of a UTI.  States her urologist told her she has an overactive bladder, but she just does not feel like that is what is going on right now.  She would like to be checked for a UTI.   ROS: Per HPI  Current Outpatient Medications:  .  ALPRAZolam (XANAX) 0.25 MG tablet, Take 1 tablet (0.25 mg total) by mouth 2 (two) times daily as needed for anxiety., Disp: 45 tablet, Rfl: 5 .  busPIRone (BUSPAR) 5 MG tablet, Take 1 tablet (5 mg total) by mouth 2 (two) times daily as needed. (Needs to be seen before next refill), Disp: 60 tablet, Rfl: 2 .  fluticasone (FLONASE) 50 MCG/ACT nasal spray, Place 2 sprays into both nostrils daily., Disp: 16 g, Rfl: 6 .  ibuprofen (ADVIL) 600 MG tablet, TAKE 1 TABLET BY MOUTH EVERY 8 HOURS AS NEEDED FOR MILD OR MODERATE PAIN, Disp: 90 tablet, Rfl: 0 .  traMADol (ULTRAM) 50 MG tablet, Take 1 tablet (50 mg total) by mouth every 6 (six) hours as needed., Disp: 30 tablet, Rfl: 2 .  traZODone (DESYREL) 50 MG tablet, Take 1-2 tablets (50-100 mg total) by mouth at bedtime as needed. for sleep, Disp: 180 tablet,  Rfl: 2 .  venlafaxine XR (EFFEXOR-XR) 37.5 MG 24 hr capsule, TAKE 1 CAPSULE BY MOUTH DAILY WITH BREAKFAST., Disp: 90 capsule, Rfl: 1  Allergies  Allergen Reactions  . Ciprofloxacin     Makes her feel weak,dizzy  . Imitrex [Sumatriptan]     Blurred vision   . Prednisone Nausea And Vomiting  . Tylenol [Acetaminophen]     Tylenol #3,Headaches  . Vicodin [Hydrocodone-Acetaminophen]     GI upset   Past Medical History:  Diagnosis Date  . Anxiety   . Arthritis   . Depression   . Menopausal symptom   . Migraine   . Scoliosis     Observations/Objective: A&O  No respiratory distress or wheezing audible over the phone Mood, judgement, and thought processes all WNL  Assessment and Plan: 1. Urinary frequency - Urinalysis, Complete; Future - Urine Culture; Future   Follow Up Instructions:  I discussed the assessment and treatment plan with the patient. The patient was provided an opportunity to ask questions and all were answered. The patient agreed with the plan and demonstrated an understanding of the instructions.   The patient was advised to call back or seek an in-person evaluation if the symptoms worsen or if the condition fails to improve as anticipated.  The above assessment and management plan was discussed with the patient. The patient verbalized understanding of and has agreed to the management plan. Patient is aware to  call the clinic if symptoms persist or worsen. Patient is aware when to return to the clinic for a follow-up visit. Patient educated on when it is appropriate to go to the emergency department.   Time call ended: 11:44 AM  I provided 8 minutes of non-face-to-face time during this encounter.  Hendricks Limes, MSN, APRN, FNP-C [facility] 02/25/21

## 2021-02-25 NOTE — Addendum Note (Signed)
Addended by: Liliane Bade on: 02/25/2021 02:09 PM   Modules accepted: Orders

## 2021-02-25 NOTE — Addendum Note (Signed)
Addended by: Karle Plumber on: 02/25/2021 02:14 PM   Modules accepted: Orders

## 2021-02-26 LAB — URINE CULTURE

## 2021-02-27 ENCOUNTER — Other Ambulatory Visit: Payer: Self-pay | Admitting: Family

## 2021-02-27 DIAGNOSIS — F331 Major depressive disorder, recurrent, moderate: Secondary | ICD-10-CM

## 2021-02-27 DIAGNOSIS — F411 Generalized anxiety disorder: Secondary | ICD-10-CM

## 2021-03-02 ENCOUNTER — Telehealth: Payer: Self-pay

## 2021-03-02 NOTE — Telephone Encounter (Signed)
Pt called to cancel her 6 mth appt for tomorrow. Says she has plenty of medicines and does not need to be seen.  Says she justs Christy to keep sending in all  medicines except Buspirone.

## 2021-03-03 ENCOUNTER — Ambulatory Visit: Payer: Medicare Other | Admitting: Family

## 2021-03-04 NOTE — Chronic Care Management (AMB) (Signed)
  Chronic Care Management   Note  03/04/2021 Name: Sandra Adams MRN: [id number] DOB: 1966/03/15  Sandra Adams is a 55 y.o. year old female who is a primary care patient of Sharion Balloon, FNP. I reached out to Sandra Adams by phone today in response to a referral sent by Ms. Weston Brass Cino's PCP, Sharion Balloon, FNP     Ms. Bhullar was given information about Chronic Care Management services today including:  1. CCM service includes personalized support from designated clinical staff supervised by her physician, including individualized plan of care and coordination with other care providers 2. 24/7 contact phone numbers for assistance for urgent and routine care needs. 3. Service will only be billed when office clinical staff spend 20 minutes or more in a month to coordinate care. 4. Only one practitioner may furnish and bill the service in a calendar month. 5. The patient may stop CCM services at any time (effective at the end of the month) by phone call to the office staff. 6. The patient will be responsible for cost sharing (co-pay) of up to 20% of the service fee (after annual deductible is met).  Patient agreed to services and verbal consent obtained.   Follow up plan: Telephone appointment with care management team member scheduled for:03/16/2021  Noreene Larsson, [place], [place], [place] [address] Direct Dial: [phone] [email or web address] Website: [email or web address]

## 2021-03-05 ENCOUNTER — Encounter: Payer: Self-pay | Admitting: Adult Health

## 2021-03-12 ENCOUNTER — Encounter: Payer: Self-pay | Admitting: Family Medicine

## 2021-03-13 ENCOUNTER — Encounter: Payer: Medicare Other | Admitting: Family

## 2021-03-16 ENCOUNTER — Ambulatory Visit (INDEPENDENT_AMBULATORY_CARE_PROVIDER_SITE_OTHER): Payer: Medicare Other | Admitting: Licensed Clinical Social Worker

## 2021-03-16 ENCOUNTER — Ambulatory Visit: Payer: Medicare Other | Admitting: *Deleted

## 2021-03-16 DIAGNOSIS — M159 Polyosteoarthritis, unspecified: Secondary | ICD-10-CM

## 2021-03-16 DIAGNOSIS — G43009 Migraine without aura, not intractable, without status migrainosus: Secondary | ICD-10-CM

## 2021-03-16 DIAGNOSIS — M8949 Other hypertrophic osteoarthropathy, multiple sites: Secondary | ICD-10-CM

## 2021-03-16 DIAGNOSIS — F331 Major depressive disorder, recurrent, moderate: Secondary | ICD-10-CM

## 2021-03-16 DIAGNOSIS — K219 Gastro-esophageal reflux disease without esophagitis: Secondary | ICD-10-CM

## 2021-03-16 DIAGNOSIS — M8589 Other specified disorders of bone density and structure, multiple sites: Secondary | ICD-10-CM

## 2021-03-16 DIAGNOSIS — F411 Generalized anxiety disorder: Secondary | ICD-10-CM

## 2021-03-16 NOTE — Patient Instructions (Signed)
Visit Information  PATIENT GOALS: Goals Addressed            This Visit's Progress   . Manage Anxiety and Stress issues faced       Timeframe:  Short-Term Goal Priority:  High Start Date:      03/16/21                       Expected End Date:          06/16/21             Follow Up Date 04/20/21   Manage My Emotions (Patient) Manage anxiety and stress issues faced    Why is this important?    When you are stressed, down or upset, your body reacts too.   For example, your blood pressure may get higher; you may have a headache or stomachache.   When your emotions get the best of you, your body's ability to fight off cold and flu gets weak.   These steps will help you manage your emotions.     Patient Self Care Activities:  Likes to walk her dog Eats meals independently Talks with family and friends via phone  Patient Coping Strengths:  . Support of friends . Does ADLs independently . Eats meals independently  Patient Self Care Deficits:  . Transport needs . Pain issues  Patient Goals:  - spend time or talk with others every day - practice relaxation or meditation daily - keep a calendar with appointment dates  Follow Up Plan:  LCSW to call client on 04/20/21      Norva Riffle.Amalio Loe MSW, LCSW Licensed Clinical Social Worker [facility] Care Management [phone]

## 2021-03-16 NOTE — Patient Instructions (Signed)
  Follow up plan:  Telephone follow up appointment with care management team member scheduled for: LCSW 04/20/21, RNCM pending  The patient has been provided with contact information for the care management team and has been advised to call with any health related questions or concerns.   Patient should reach out to PCP with any new or worsening symptoms  Chong Sicilian, BSN, RN-BC [facility] Direct Dial: [phone]

## 2021-03-16 NOTE — Chronic Care Management (AMB) (Signed)
Chronic Care Management    Clinical Social Work Note  03/16/2021 Name: Sandra Adams MRN: [id number] DOB: 1966/04/19  [name] is a 55 y.o. year old female who is a primary care patient of Sharion Balloon, FNP. The CCM team was consulted to assist the patient with chronic disease management and/or care coordination needs related to: Mental Health Counseling and Resources.   Engaged with patient by telephone for initial visit in response to provider referral for social work chronic care management and care coordination services.   Consent to Services:  The patient was given information about Chronic Care Management services, agreed to services, and gave verbal consent prior to initiation of services.  Please see initial visit note for detailed documentation.   Patient agreed to services and consent obtained.   Assessment: Review of patient past medical history, allergies, medications, and health status, including review of relevant consultants reports was performed today as part of a comprehensive evaluation and provision of chronic care management and care coordination services.     SDOH (Social Determinants of Health) assessments and interventions performed:  SDOH Interventions   Flowsheet Row Most Recent Value  SDOH Interventions   Depression Interventions/Treatment  Medication       Advanced Directives Status: See Vyn[employer] application for related entries.  CCM Care Plan  Allergies  Allergen Reactions  . Ciprofloxacin     Makes her feel weak,dizzy  . Imitrex [Sumatriptan]     Blurred vision   . Prednisone Nausea And Vomiting  . Tylenol [Acetaminophen]     Tylenol #3,Headaches  . Vicodin [Hydrocodone-Acetaminophen]     GI upset    Outpatient Encounter Medications as of 03/16/2021  Medication Sig  . ALPRAZolam (XANAX) 0.25 MG tablet Take 1 tablet (0.25 mg total) by mouth 2 (two) times daily as needed for anxiety.  . busPIRone (BUSPAR) 5 MG tablet Take 1 tablet (5  mg total) by mouth 2 (two) times daily as needed.  . fluticasone (FLONASE) 50 MCG/ACT nasal spray Place 2 sprays into both nostrils daily.  Marland Kitchen ibuprofen (ADVIL) 600 MG tablet TAKE 1 TABLET BY MOUTH EVERY 8 HOURS AS NEEDED FOR MILD OR MODERATE PAIN  . traMADol (ULTRAM) 50 MG tablet Take 1 tablet (50 mg total) by mouth every 6 (six) hours as needed.  . traZODone (DESYREL) 50 MG tablet Take 1-2 tablets (50-100 mg total) by mouth at bedtime as needed. for sleep  . venlafaxine XR (EFFEXOR-XR) 37.5 MG 24 hr capsule TAKE 1 CAPSULE BY MOUTH DAILY WITH BREAKFAST.   No facility-administered encounter medications on file as of 03/16/2021.    Patient Active Problem List   Diagnosis Date Noted  . No-show for appointment 01/27/2021  . Osteoarthritis 11/06/2019  . Osteopenia 07/21/2017  . Chronic back pain 12/31/2016  . Controlled substance agreement signed 12/31/2016  . Opioid dependence with opioid-induced disorder ([place]) 12/31/2016  . Overweight (BMI 25.0-29.9) 07/02/2016  . GERD (gastroesophageal reflux disease) 10/01/2014  . GAD (generalized anxiety disorder) 10/01/2014  . Depression 10/01/2014  . Menopausal hot flushes 09/16/2014  . Dysmenorrhea 05/20/2014  . Migraine without aura 03/27/2014    Conditions to be addressed/monitored: Monitor anxiety and stress issues faced by client   Care Plan : [place]  Updates made by Katha Cabal, LCSW since 03/16/2021 12:00 AM    Problem: Emotional Distress     Goal: Emotional Health Supported   Start Date: 03/16/2021  Expected End Date: 06/16/2021  This Visit's Progress: Not on track  Priority: High  Note:   Current Barriers:  . Chronic Mental Health needs related to anxiety and stress issues . Concerns about social isolation . Suicidal Ideation/Homicidal Ideation: No . Pain issues faced  Clinical Social Work Goal(s):  . patient will work with SW monthly by telephone or in person to reduce or manage symptoms related to anxiety or  stress issues . Patient will try to attend scheduled medical appointments . Patient will call RNCM or LCSW as needed for support  Interventions: . Patient interviewed and appropriate assessments performed: GAD-7; PHQ-9 . 1:1 collaboration with Sharion Balloon, FNP regarding development and update of comprehensive plan of care as evidenced by provider attestation and co-signature . Client and LCSW talked about pain issues of client (client talked about migraines faced) . Talked with client about transport needs of client. . Talked with client about appetite of client . Talked with client about sleeping issues of client . Talked with client about ambulation of client . Talked with client about mood of client and client management of mood . Talked with client about anxiety issues of client (client spoke of panic episodes faced) . Talked with client about knee issues on left knee . Provided counseling support for client  Patient Self Care Activities:  Likes to walk her dog Eats meals independently Talks with family and friends via phone  Patient Coping Strengths:  . Support of friends . Does ADLs independently . Eats meals independently  Patient Self Care Deficits:  . Transport needs . Pain issues  Patient Goals:  - spend time or talk with others every day - practice relaxation or meditation daily - keep a calendar with appointment dates  Follow Up Plan:  LCSW to call client on 04/20/21     Norva Riffle.Roselyne Stalnaker MSW, LCSW Licensed Clinical Social Worker [facility] Care Management [phone]

## 2021-03-16 NOTE — Chronic Care Management (AMB) (Signed)
  Chronic Care Management   Note  03/16/2021 Name: Sandra Adams MRN: [id number] DOB: [birth year]  Consulted by Theadore Nan, LCSW who spoke with patient today for an Initial CCM Telephone Visit. LCSW felt that patient would benefit from connecting with RN Care Manager for care coordination and chronic care management regarding her medical conditions. Her last PCP office visit was on 02/25/21 for urinary frequency. She has complaints of anxiety, difficulty going to sleep, and awareness of heart beat as well as GI and urinary complaints.  I asked embedded CCM care guides to reach out to patient to schedule an initial 60 minute RN intake. LCSW will continue to address psychosocial issues/concerns.  Follow up plan:  Telephone follow up appointment with care management team member scheduled for: LCSW 04/20/21, RNCM pending  The patient has been provided with contact information for the care management team and has been advised to call with any health related questions or concerns.   Patient should reach out to PCP with any new or worsening symptoms  Sandra Adams, BSN, RN-BC [place] [facility] [place] [facility] Direct Dial: [phone]

## 2021-03-17 ENCOUNTER — Telehealth: Payer: Self-pay

## 2021-03-17 DIAGNOSIS — J0100 Acute maxillary sinusitis, unspecified: Secondary | ICD-10-CM | POA: Diagnosis not present

## 2021-03-17 NOTE — Telephone Encounter (Signed)
Patient aware okay to take

## 2021-03-17 NOTE — Telephone Encounter (Signed)
Patient aware and verbalized understanding. °

## 2021-03-17 NOTE — Telephone Encounter (Signed)
Patient aware to call pharmacy for directions

## 2021-03-25 ENCOUNTER — Telehealth: Payer: Medicare Other

## 2021-03-25 ENCOUNTER — Telehealth: Payer: Self-pay | Admitting: *Deleted

## 2021-03-25 NOTE — Chronic Care Management (AMB) (Signed)
  Care Management   Note  03/25/2021 Name: Sandra Adams MRN: [id number] DOB: 01/24/1966  Sandra Adams is a 55 y.o. year old female who is a primary care patient of Sharion Balloon, FNP and is actively engaged with the care management team. I reached out to Sandra Adams by phone today to assist with re-scheduling an initial visit with the RN Case Manager  Follow up plan: Unsuccessful telephone outreach attempt made. The care management team will reach out to the patient again over the next 7 days.  If patient returns call to provider office, please advise to call [place] Lysle Morales at [place] [facility]

## 2021-04-01 NOTE — Chronic Care Management (AMB) (Signed)
  Care Management   Note  04/01/2021 Name: Sandra Adams MRN: [id number] DOB: [birth year]  Sandra Adams is a 55 y.o. year old female who is a primary care patient of Sharion Balloon, FNP and is actively engaged with the care management team. I reached out to Sandra Adams by phone today to assist with re-scheduling an initial visit with the RN Case Manager  Follow up plan: A second unsuccessful telephone outreach attempt made. The care management team will reach out to the patient again over the next 5 days.  If patient returns call to provider office, please advise to call [place] Lysle Morales at [place] [facility]

## 2021-04-07 NOTE — Chronic Care Management (AMB) (Signed)
  Care Management   Note  04/07/2021 Name: Sandra Adams MRN: [id number] DOB: [birth year]  Sandra Adams is a 55 y.o. year old female who is a primary care patient of Sharion Balloon, FNP and is actively engaged with the care management team. I reached out to Sandra Adams by phone today to assist with re-scheduling an initial visit with the RN Case Manager  Follow up plan: Telephone appointment with care management team member scheduled for: 04/15/2021  [place] [facility]

## 2021-04-15 ENCOUNTER — Ambulatory Visit (INDEPENDENT_AMBULATORY_CARE_PROVIDER_SITE_OTHER): Payer: Medicare Other | Admitting: *Deleted

## 2021-04-15 DIAGNOSIS — F331 Major depressive disorder, recurrent, moderate: Secondary | ICD-10-CM | POA: Diagnosis not present

## 2021-04-15 DIAGNOSIS — F411 Generalized anxiety disorder: Secondary | ICD-10-CM

## 2021-04-15 NOTE — Patient Instructions (Signed)
Visit Information  PATIENT GOALS: Goals Addressed            This Visit's Progress   . Management Plan Developed       Timeframe:  Long-Range Goal Priority:  Medium Start Date:   04/15/21                          Expected End Date:                       Follow-up: LCSW 04/20/21 and 04/30/21 with RNCM  . Take medication as prescribed . Talk with LCSW regarding psychosocial issues . Eat a healthy diet that is high in fiber, fruits, vegetables, lean proteins and healthy fats and to avoid simple carbohydrates (sugar, white bread, pasta, etc), fast food, and fried/greasy foods . Have a complete physical with PCP . Discuss medical concerns with PCP . Call RN Care Manager as needed       Patient verbalizes understanding of instructions provided today and agrees to view in [place].   Follow Up Plan:  . Telephone follow up appointment with care management team member scheduled for: 04/20/21 with LCSW and 04/30/21 with RNCM . The patient has been provided with contact information for the care management team and has been advised to call with any health related questions or concerns.  . Next PCP appointment scheduled for: 04/16/21 with [name], FNP  Chong Sicilian, BSN, RN-BC [place] [facility] [place] [facility] Direct Dial: [phone]

## 2021-04-15 NOTE — Chronic Care Management (AMB) (Signed)
Chronic Care Management   CCM RN Visit Note  04/15/2021 Name: Sandra Adams MRN: [id number] DOB: [birth year]  Subjective: Sandra Adams is a 55 y.o. year old female who is a primary care patient of Sharion Balloon, FNP. The care management team was consulted for assistance with disease management and care coordination needs.    Engaged with patient by telephone for follow up visit in response to provider referral for case management and/or care coordination services.   Consent to Services:  The patient was given information about Chronic Care Management services, agreed to services, and gave verbal consent prior to initiation of services.  Please see initial visit note for detailed documentation.   Patient agreed to services and verbal consent obtained.   Assessment: Review of patient past medical history, allergies, medications, health status, including review of consultants reports, laboratory and other test data, was performed as part of comprehensive evaluation and provision of chronic care management services.   SDOH (Social Determinants of Health) assessments and interventions performed:    CCM Care Plan  Allergies  Allergen Reactions  . Ciprofloxacin     Makes her feel weak,dizzy  . Imitrex [Sumatriptan]     Blurred vision   . Prednisone Nausea And Vomiting  . Tylenol [Acetaminophen]     Tylenol #3,Headaches  . Vicodin [Hydrocodone-Acetaminophen]     GI upset    Outpatient Encounter Medications as of 04/15/2021  Medication Sig  . ALPRAZolam (XANAX) 0.25 MG tablet Take 1 tablet (0.25 mg total) by mouth 2 (two) times daily as needed for anxiety.  . busPIRone (BUSPAR) 5 MG tablet Take 1 tablet (5 mg total) by mouth 2 (two) times daily as needed.  . fluticasone (FLONASE) 50 MCG/ACT nasal spray Place 2 sprays into both nostrils daily.  Marland Kitchen ibuprofen (ADVIL) 600 MG tablet TAKE 1 TABLET BY MOUTH EVERY 8 HOURS AS NEEDED FOR MILD OR MODERATE PAIN  . traMADol (ULTRAM) 50 MG  tablet Take 1 tablet (50 mg total) by mouth every 6 (six) hours as needed.  . traZODone (DESYREL) 50 MG tablet Take 1-2 tablets (50-100 mg total) by mouth at bedtime as needed. for sleep  . venlafaxine XR (EFFEXOR-XR) 37.5 MG 24 hr capsule TAKE 1 CAPSULE BY MOUTH DAILY WITH BREAKFAST.   No facility-administered encounter medications on file as of 04/15/2021.    Patient Active Problem List   Diagnosis Date Noted  . No-show for appointment 01/27/2021  . Osteoarthritis 11/06/2019  . Osteopenia 07/21/2017  . Chronic back pain 12/31/2016  . Controlled substance agreement signed 12/31/2016  . Opioid dependence with opioid-induced disorder ([place]) 12/31/2016  . Overweight (BMI 25.0-29.9) 07/02/2016  . GERD (gastroesophageal reflux disease) 10/01/2014  . GAD (generalized anxiety disorder) 10/01/2014  . Depression 10/01/2014  . Menopausal hot flushes 09/16/2014  . Dysmenorrhea 05/20/2014  . Migraine without aura 03/27/2014    Conditions to be addressed/monitored:Anxiety and Depression  Care Plan : RNCM: Wellness (Adult)  Updates made by Ilean China, RN since 04/15/2021 12:00 AM    Problem: Health Promotion & Health Maintenance   Priority: Medium    Long-Range Goal: Management Plan Developed   Start Date: 04/15/2021  This Visit's Progress: On track  Priority: Medium  Note:   Current Barriers:  . Ineffective Self Health Maintenance  . Transportation barriers . Frequently cancels appointments . Unable to independently drive  Nurse Case Manager Clinical Goal(s):  . patient will work with PCP and [facility] Clinical Staff to address needs related to completion of  health maintenance items and follow-up abnormal lab and imaging results . patient will meet with RN Care Manager to address self-management of chronic conditions and development of self-management plan  Interventions:  . 1:1 collaboration with Sharion Balloon, FNP regarding development and update of comprehensive plan of care as  evidenced by provider attestation and co-signature . Inter-disciplinary care team collaboration (see longitudinal plan of care) . Chart reviewed including relevant office notes, health maintenance check list, imaging reports, and lab results . Reviewed and discussed medications with patient o Compliant and no difficulty with affordability. Insurance pays 100%. . Discussed family and social support o Has support from mother, boyfriend, and friends . Reviewed providers recommendation to schedule a complete physical in May 2022.  Marland Kitchen Reviewed and discussed Health Maintenance items:  o Reviewed last pap results from 2018 and advised she is overdue for that o M[facility]ogram is scheduled at [facility] o Due for lipid panel o Overdue for colonoscopy. Did not do cologuard. Would like colonoscopy instead. . Reviewed and discussed chest CT from 2019. At that time, it was recommended that she could repeat in a year to f/u on left lower lobe .53mm pulmonary nodule. She is not a smoker and is low risk for cancer.  . Discussed persistently elevated but stable liver functions . Advised to avoid tylenol products . Reinforced a healthy diet that is high in fiber, fruits, vegetables, lean proteins and healthy fats and to avoid simple carbohydrates, fast food, and fried/greasy foods . Collaborated with PCP regarding patient's concern and request to follow-up on these abnormalities . Collaborated with front office staff to schedule a complete physical with pap with PCP . Collaborated with LCSW to provide an update. He's scheduled for telephone visit on 04/20/21. Marland Kitchen Discussed current health complaints: o Intermittent sharp pain in right upper chest. Appt scheduled with PCP. o Difficulty sleeping. Trazodone makes her nauseas and causes abd pain. Discussed sleep hygiene. Recommended she try OTC benadryl 25mg  or 50mg  in place of Trazodone. If that doesn't help, talk with PCP.  Marland Kitchen Discussed plans with patient for ongoing care  management follow up and provided patient with direct contact information for care management team . Provided with CCM team contact information and encouraged to reach out as needed.   Self Care Activities:  . Self administers medications as prescribed . Calls pharmacy for medication refills . Performs ADL's independently . Calls provider office for new concerns or questions  Patient Goals: Over the next 30 days, patient will: . Take medication as prescribed . Talk with LCSW regarding psychosocial issues . Eat a healthy diet that is high in fiber, fruits, vegetables, lean proteins and healthy fats and to avoid simple carbohydrates (sugar, white bread, pasta, etc), fast food, and fried/greasy foods . Have a complete physical with PCP . Discuss medical concerns with PCP . Call RN Care Manager as needed    Follow Up Plan:  . Telephone follow up appointment with care management team member scheduled for: 04/20/21 with LCSW and 04/30/21 with RNCM . The patient has been provided with contact information for the care management team and has been advised to call with any health related questions or concerns.  . Next PCP appointment scheduled for: 04/16/21 with [name], FNP  Chong Sicilian, BSN, RN-BC [place] [facility] [place] [facility] Direct Dial: [phone]

## 2021-04-16 ENCOUNTER — Encounter: Payer: Self-pay | Admitting: Family

## 2021-04-16 ENCOUNTER — Ambulatory Visit (INDEPENDENT_AMBULATORY_CARE_PROVIDER_SITE_OTHER): Payer: Medicare Other | Admitting: Family

## 2021-04-16 ENCOUNTER — Other Ambulatory Visit (HOSPITAL_COMMUNITY)
Admission: RE | Admit: 2021-04-16 | Discharge: 2021-04-16 | Disposition: A | Discharge status: Home / Self Care | Payer: Medicare Other | Source: Ambulatory Visit | Attending: Family | Admitting: Family

## 2021-04-16 ENCOUNTER — Other Ambulatory Visit: Payer: Self-pay

## 2021-04-16 VITALS — BP 137/80 | HR 81 | Temp 97.1°F | Ht 62.0 in | Wt 148.6 lb

## 2021-04-16 DIAGNOSIS — Z79899 Other long term (current) drug therapy: Secondary | ICD-10-CM | POA: Diagnosis not present

## 2021-04-16 DIAGNOSIS — M159 Polyosteoarthritis, unspecified: Secondary | ICD-10-CM

## 2021-04-16 DIAGNOSIS — E663 Overweight: Secondary | ICD-10-CM

## 2021-04-16 DIAGNOSIS — F331 Major depressive disorder, recurrent, moderate: Secondary | ICD-10-CM

## 2021-04-16 DIAGNOSIS — Z0000 Encounter for general adult medical examination without abnormal findings: Secondary | ICD-10-CM | POA: Diagnosis not present

## 2021-04-16 DIAGNOSIS — M8949 Other hypertrophic osteoarthropathy, multiple sites: Secondary | ICD-10-CM | POA: Diagnosis not present

## 2021-04-16 DIAGNOSIS — K219 Gastro-esophageal reflux disease without esophagitis: Secondary | ICD-10-CM

## 2021-04-16 DIAGNOSIS — F1129 Opioid dependence with unspecified opioid-induced disorder: Secondary | ICD-10-CM

## 2021-04-16 DIAGNOSIS — Z0001 Encounter for general adult medical examination with abnormal findings: Secondary | ICD-10-CM

## 2021-04-16 DIAGNOSIS — G8929 Other chronic pain: Secondary | ICD-10-CM

## 2021-04-16 DIAGNOSIS — Z01419 Encounter for gynecological examination (general) (routine) without abnormal findings: Secondary | ICD-10-CM | POA: Diagnosis present

## 2021-04-16 DIAGNOSIS — M545 Low back pain, unspecified: Secondary | ICD-10-CM

## 2021-04-16 DIAGNOSIS — Z1211 Encounter for screening for malignant neoplasm of colon: Secondary | ICD-10-CM

## 2021-04-16 DIAGNOSIS — M150 Primary generalized (osteo)arthritis: Secondary | ICD-10-CM

## 2021-04-16 DIAGNOSIS — G43009 Migraine without aura, not intractable, without status migrainosus: Secondary | ICD-10-CM | POA: Diagnosis not present

## 2021-04-16 DIAGNOSIS — F411 Generalized anxiety disorder: Secondary | ICD-10-CM

## 2021-04-16 MED ORDER — TRAMADOL HCL 50 MG PO TABS: 50.0000 mg | ORAL_TABLET | Freq: Four times a day (QID) | ORAL | 2 refills | Status: DC | PRN

## 2021-04-16 MED ORDER — ALPRAZOLAM 0.25 MG PO TABS: 0.2500 mg | ORAL_TABLET | Freq: Two times a day (BID) | ORAL | 5 refills | Status: DC | PRN

## 2021-04-16 NOTE — Progress Notes (Signed)
Subjective:    Patient ID: Sandra Adams, female    DOB: 1966-12-02, 55 y.o.   MRN: [id number]  Chief Complaint  Patient presents with  . Annual Exam    Physical with pap   Pt presents to the office today for CPE with pap.  Anxiety Presents for follow-up visit. Symptoms include excessive worry, insomnia, irritability, nervous/anxious behavior and restlessness. Symptoms occur occasionally. The severity of symptoms is moderate.    Gastroesophageal Reflux She complains of belching and a hoarse voice. This is a chronic problem. The current episode started more than 1 year ago. The problem occurs occasionally. The problem has been waxing and waning. The symptoms are aggravated by certain foods. She has tried a PPI for the symptoms. The treatment provided moderate relief.  Arthritis Presents for follow-up visit. She complains of pain and stiffness. The symptoms have been stable. Affected locations include the left knee and right knee. Her pain is at a severity of 3/10.  Depression        This is a chronic problem.  The current episode started more than 1 year ago.   The problem occurs intermittently.  The problem has been waxing and waning since onset.  Associated symptoms include insomnia, irritable, restlessness and sad.  Associated symptoms include no helplessness and no hopelessness.  Past medical history includes anxiety.   Insomnia Primary symptoms: difficulty falling asleep, malaise/fatigue.  The current episode started more than one year. The onset quality is gradual. PMH includes: depression.  Gynecologic Exam The patient's pertinent negatives include no genital itching or genital lesions. The current episode started more than 1 year ago. The problem occurs intermittently.      Review of Systems  Constitutional: Positive for irritability and malaise/fatigue.  HENT: Positive for hoarse voice.   Musculoskeletal: Positive for arthritis and stiffness.  Psychiatric/Behavioral:  Positive for depression. The patient is nervous/anxious and has insomnia.   All other systems reviewed and are negative.  Family History  Problem Relation Age of Onset  . Epilepsy Mother   . COPD Father   . Hyperlipidemia Father   . Heart disease Father   . Cancer Father   . COPD Sister   . Anxiety disorder Sister   . COPD Maternal Aunt   . Diabetes Maternal Uncle   . Diabetes Maternal Aunt        x2  . Colon cancer Neg Hx   . Colon polyps Neg Hx   . Esophageal cancer Neg Hx   . Gallbladder disease Neg Hx    Social History   Socioeconomic History  . Marital status: Divorced    Spouse name: Not on file  . Number of children: 0  . Years of education: 12th  . Highest education level: High school graduate  Occupational History  . 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 Smoking status: Never Smoker  . Smokeless tobacco: Never Used  Vaping Use  . Vaping Use: Never used  Substance and Sexual Activity  . Alcohol use: No  . Drug use: No  . Sexual activity: Not Currently    Partners: Male    Birth control/protection: None  Other Topics Concern  . Not on file  Social History Narrative   Single,no children   Right handed   12 th grade   No caffeine   Social Determinants of Health   Financial Resource Strain: Low Risk   . Difficulty of Paying Living Expenses:  Not very hard  Food Insecurity: No Food Insecurity  . Worried About Charity fundraiser in the Last Year: Never true  . Ran Out of Food in the Last Year: Never true  Transportation Needs: No Transportation Needs  . Lack of Transportation (Medical): No  . Lack of Transportation (Non-Medical): No  Physical Activity: Not on file  Stress: Not on file  Social Connections: Moderately Isolated  . Frequency of Communication with Friends and Family: More than three times a week  . Frequency of Social Gatherings with Friends and Family: More than three times a week  . Attends  Religious Services: More than 4 times per year  . Active Member of Clubs or Organizations: No  . Attends Archivist Meetings: Never  . Marital Status: Divorced       Objective:   Physical Exam Vitals reviewed.  Constitutional:      General: She is irritable. She is not in acute distress.    Appearance: She is well-developed.  HENT:     Head: Normocephalic and atraumatic.     Right Ear: Tympanic membrane normal.     Left Ear: Tympanic membrane normal.  Eyes:     Pupils: Pupils are equal, round, and reactive to light.  Neck:     Thyroid: No thyromegaly.  Cardiovascular:     Rate and Rhythm: Normal rate and regular rhythm.     Heart sounds: Normal heart sounds. No murmur heard.   Pulmonary:     Effort: Pulmonary effort is normal. No respiratory distress.     Breath sounds: Normal breath sounds. No wheezing.  Chest:  Breasts:     Right: No swelling, bleeding, inverted nipple, mass, nipple discharge, skin change or tenderness.     Left: No swelling, bleeding, inverted nipple, mass, nipple discharge, skin change or tenderness.    Abdominal:     General: Bowel sounds are normal. There is no distension.     Palpations: Abdomen is soft.     Tenderness: There is no abdominal tenderness.  Genitourinary:    General: Normal vulva.     Comments: Bimanual exam- no adnexal masses or tenderness, ovaries nonpalpable   Cervix parous and pink- Discharge noted.   Musculoskeletal:        General: No tenderness. Normal range of motion.     Cervical back: Normal range of motion and neck supple.  Skin:    General: Skin is warm and dry.  Neurological:     Mental Status: She is alert and oriented to person, place, and time.     Cranial Nerves: No cranial nerve deficit.     Deep Tendon Reflexes: Reflexes are normal and symmetric.  Psychiatric:        Behavior: Behavior normal.        Thought Content: Thought content normal.        Judgment: Judgment normal.        BP  137/80   Pulse 81   Temp (!) 97.1 F (36.2 C) (Temporal)   Ht 5' 2" (1.575 m)   Wt 148 lb 9.6 oz (67.4 kg)   LMP 10/16/2014   BMI 27.18 kg/m   Assessment & Plan:  Sandra Adams comes in today with chief complaint of Annual Exam (Physical with pap)   Diagnosis and orders addressed:  1. Annual physical exam - CMP14+EGFR - CBC with Differential/Platelet - Lipid panel - TSH - VITAMIN D 25 Hydroxy (Vit-D Deficiency, Fractures) - Ambulatory referral to Gastroenterology -  Cytology - PAP([place])  2. Gastroesophageal reflux disease without esophagitis - CMP14+EGFR - CBC with Differential/Platelet  3. Migraine without aura and without status migrainosus, not intractable - CMP14+EGFR - CBC with Differential/Platelet  4. Opioid dependence with opioid-induced disorder (HCC) - CMP14+EGFR - CBC with Differential/Platelet  5. Primary osteoarthritis involving multiple joints - CMP14+EGFR - CBC with Differential/Platelet  6. Chronic bilateral low back pain without sciatica - CMP14+EGFR - CBC with Differential/Platelet - traMADol (ULTRAM) 50 MG tablet; Take 1 tablet (50 mg total) by mouth every 6 (six) hours as needed.  Dispense: 30 tablet; Refill: 2  7. Controlled substance agreement signed - CMP14+EGFR - CBC with Differential/Platelet - ALPRAZolam (XANAX) 0.25 MG tablet; Take 1 tablet (0.25 mg total) by mouth 2 (two) times daily as needed for anxiety.  Dispense: 45 tablet; Refill: 5  8. Moderate episode of recurrent major depressive disorder (HCC) - CMP14+EGFR - CBC with Differential/Platelet  9. GAD (generalized anxiety disorder) - CMP14+EGFR - CBC with Differential/Platelet - ALPRAZolam (XANAX) 0.25 MG tablet; Take 1 tablet (0.25 mg total) by mouth 2 (two) times daily as needed for anxiety.  Dispense: 45 tablet; Refill: 5  10. Overweight (BMI 25.0-29.9) - CMP14+EGFR - CBC with Differential/Platelet - ALPRAZolam (XANAX) 0.25 MG tablet; Take 1 tablet (0.25 mg  total) by mouth 2 (two) times daily as needed for anxiety.  Dispense: 45 tablet; Refill: 5  11. Gynecologic exam normal - CMP14+EGFR - CBC with Differential/Platelet - Cytology - PAP([place])  12. Colon cancer screening - CMP14+EGFR - CBC with Differential/Platelet - Ambulatory referral to Gastroenterology   Labs pending Patient reviewed in [place] controlled database, no flags noted. Contract and drug screen are up to date.  Health Maintenance reviewed Diet and exercise encouraged  Follow up plan: 3 months    [name], FNP

## 2021-04-16 NOTE — Patient Instructions (Signed)
Health Maintenance, Female Adopting a healthy lifestyle and getting preventive care are important in promoting health and wellness. Ask your health care provider about:  The right schedule for you to have regular tests and exams.  Things you can do on your own to prevent diseases and keep yourself healthy. What should I know about diet, weight, and exercise? Eat a healthy diet  Eat a diet that includes plenty of vegetables, fruits, low-fat dairy products, and lean protein.  Do not eat a lot of foods that are high in solid fats, added sugars, or sodium.   Maintain a healthy weight Body mass index (BMI) is used to identify weight problems. It estimates body fat based on height and weight. Your health care provider can help determine your BMI and help you achieve or maintain a healthy weight. Get regular exercise Get regular exercise. This is one of the most important things you can do for your health. Most adults should:  Exercise for at least 150 minutes each week. The exercise should increase your heart rate and make you sweat (moderate-intensity exercise).  Do strengthening exercises at least twice a week. This is in addition to the moderate-intensity exercise.  Spend less time sitting. Even light physical activity can be beneficial. Watch cholesterol and blood lipids Have your blood tested for lipids and cholesterol at 57 years of age, then have this test every 5 years. Have your cholesterol levels checked more often if:  Your lipid or cholesterol levels are high.  You are older than 57 years of age.  You are at high risk for heart disease. What should I know about cancer screening? Depending on your health history and family history, you may need to have cancer screening at various ages. This may include screening for:  Breast cancer.  Cervical cancer.  Colorectal cancer.  Skin cancer.  Lung cancer. What should I know about heart disease, diabetes, and high blood  pressure? Blood pressure and heart disease  High blood pressure causes heart disease and increases the risk of stroke. This is more likely to develop in people who have high blood pressure readings, are of African descent, or are overweight.  Have your blood pressure checked: ? Every 3-5 years if you are 18-39 years of age. ? Every year if you are 40 years old or older. Diabetes Have regular diabetes screenings. This checks your fasting blood sugar level. Have the screening done:  Once every three years after age 40 if you are at a normal weight and have a low risk for diabetes.  More often and at a younger age if you are overweight or have a high risk for diabetes. What should I know about preventing infection? Hepatitis B If you have a higher risk for hepatitis B, you should be screened for this virus. Talk with your health care provider to find out if you are at risk for hepatitis B infection. Hepatitis C Testing is recommended for:  Everyone born from 1945 through 1965.  Anyone with known risk factors for hepatitis C. Sexually transmitted infections (STIs)  Get screened for STIs, including gonorrhea and chlamydia, if: ? You are sexually active and are younger than 57 years of age. ? You are older than 57 years of age and your health care provider tells you that you are at risk for this type of infection. ? Your sexual activity has changed since you were last screened, and you are at increased risk for chlamydia or gonorrhea. Ask your health care provider   if you are at risk.  Ask your health care provider about whether you are at high risk for HIV. Your health care provider may recommend a prescription medicine to help prevent HIV infection. If you choose to take medicine to prevent HIV, you should first get tested for HIV. You should then be tested every 3 months for as long as you are taking the medicine. Pregnancy  If you are about to stop having your period (premenopausal) and  you may become pregnant, seek counseling before you get pregnant.  Take 400 to 800 micrograms (mcg) of folic acid every day if you become pregnant.  Ask for birth control (contraception) if you want to prevent pregnancy. Osteoporosis and menopause Osteoporosis is a disease in which the bones lose minerals and strength with aging. This can result in bone fractures. If you are 65 years old or older, or if you are at risk for osteoporosis and fractures, ask your health care provider if you should:  Be screened for bone loss.  Take a calcium or vitamin D supplement to lower your risk of fractures.  Be given hormone replacement therapy (HRT) to treat symptoms of menopause. Follow these instructions at home: Lifestyle  Do not use any products that contain nicotine or tobacco, such as cigarettes, e-cigarettes, and chewing tobacco. If you need help quitting, ask your health care provider.  Do not use street drugs.  Do not share needles.  Ask your health care provider for help if you need support or information about quitting drugs. Alcohol use  Do not drink alcohol if: ? Your health care provider tells you not to drink. ? You are pregnant, may be pregnant, or are planning to become pregnant.  If you drink alcohol: ? Limit how much you use to 0-1 drink a day. ? Limit intake if you are breastfeeding.  Be aware of how much alcohol is in your drink. In the [place] one drink equals one 12 oz bottle of beer (355 mL), one 5 oz glass of wine (148 mL), or one 1 oz glass of hard liquor (44 mL). General instructions  Schedule regular health, dental, and eye exams.  Stay current with your vaccines.  Tell your health care provider if: ? You often feel depressed. ? You have ever been abused or do not feel safe at home. Summary  Adopting a healthy lifestyle and getting preventive care are important in promoting health and wellness.  Follow your health care provider's instructions about healthy  diet, exercising, and getting tested or screened for diseases.  Follow your health care provider's instructions on monitoring your cholesterol and blood pressure. This information is not intended to replace advice given to you by your health care provider. Make sure you discuss any questions you have with your health care provider. Document Revised: 11/29/2018 Document Reviewed: 11/29/2018 Elsevier Patient Education  2021 Elsevier Inc.  

## 2021-04-17 ENCOUNTER — Other Ambulatory Visit: Payer: Self-pay | Admitting: Family

## 2021-04-17 ENCOUNTER — Ambulatory Visit: Payer: Medicare Other | Admitting: Urology

## 2021-04-17 DIAGNOSIS — R748 Abnormal levels of other serum enzymes: Secondary | ICD-10-CM

## 2021-04-17 LAB — LIPID PANEL
Chol/HDL Ratio: 3.9 ratio (ref 0.0–4.4)
Cholesterol, Total: 209 mg/dL — ABNORMAL HIGH (ref 100–199)
HDL: 53 mg/dL (ref >39)
LDL Chol Calc (NIH): 125 mg/dL — ABNORMAL HIGH (ref 0–99)
Triglycerides: 175 mg/dL — ABNORMAL HIGH (ref 0–149)
VLDL Cholesterol Cal: 31 mg/dL (ref 5–40)

## 2021-04-17 LAB — CBC WITH DIFFERENTIAL/PLATELET
Basophils Absolute: 0.1 10*3/uL (ref 0.0–0.2)
Basos: 1 %
EOS (ABSOLUTE): 0.2 10*3/uL (ref 0.0–0.4)
Eos: 2 %
Hematocrit: 43.4 % (ref 34.0–46.6)
Hemoglobin: 14.6 g/dL (ref 11.1–15.9)
Immature Grans (Abs): 0 10*3/uL (ref 0.0–0.1)
Immature Granulocytes: 0 %
Lymphocytes Absolute: 3.8 10*3/uL — ABNORMAL HIGH (ref 0.7–3.1)
Lymphs: 38 %
MCH: 32 pg (ref 26.6–33.0)
MCHC: 33.6 g/dL (ref 31.5–35.7)
MCV: 95 fL (ref 79–97)
Monocytes Absolute: 0.7 10*3/uL (ref 0.1–0.9)
Monocytes: 7 %
Neutrophils Absolute: 5.2 10*3/uL (ref 1.4–7.0)
Neutrophils: 52 %
Platelets: 243 10*3/uL (ref 150–450)
RBC: 4.56 x10E6/uL (ref 3.77–5.28)
RDW: 12.6 % (ref 11.7–15.4)
WBC: 10 10*3/uL (ref 3.4–10.8)

## 2021-04-17 LAB — CMP14+EGFR
ALT: 52 IU/L — ABNORMAL HIGH (ref 0–32)
AST: 28 IU/L (ref 0–40)
Albumin/Globulin Ratio: 1.9 (ref 1.2–2.2)
Albumin: 4.7 g/dL (ref 3.8–4.9)
Alkaline Phosphatase: 134 IU/L — ABNORMAL HIGH (ref 44–121)
BUN/Creatinine Ratio: 10 (ref 9–23)
BUN: 8 mg/dL (ref 6–24)
Bilirubin Total: 0.2 mg/dL (ref 0.0–1.2)
CO2: 25 mmol/L (ref 20–29)
Calcium: 9.6 mg/dL (ref 8.7–10.2)
Chloride: 102 mmol/L (ref 96–106)
Creatinine, Ser: 0.77 mg/dL (ref 0.57–1.00)
Globulin, Total: 2.5 g/dL (ref 1.5–4.5)
Glucose: 80 mg/dL (ref 65–99)
Potassium: 3.9 mmol/L (ref 3.5–5.2)
Sodium: 141 mmol/L (ref 134–144)
Total Protein: 7.2 g/dL (ref 6.0–8.5)
eGFR: 90 mL/min/{1.73_m2} (ref >59)

## 2021-04-17 LAB — TSH: TSH: 2.27 u[IU]/mL (ref 0.450–4.500)

## 2021-04-17 LAB — VITAMIN D 25 HYDROXY (VIT D DEFICIENCY, FRACTURES): Vit D, 25-Hydroxy: 23.2 ng/mL — ABNORMAL LOW (ref 30.0–100.0)

## 2021-04-20 ENCOUNTER — Telehealth: Payer: Self-pay | Admitting: Family

## 2021-04-20 ENCOUNTER — Ambulatory Visit (INDEPENDENT_AMBULATORY_CARE_PROVIDER_SITE_OTHER): Payer: Medicare Other | Admitting: Licensed Clinical Social Worker

## 2021-04-20 DIAGNOSIS — F331 Major depressive disorder, recurrent, moderate: Secondary | ICD-10-CM

## 2021-04-20 DIAGNOSIS — G43009 Migraine without aura, not intractable, without status migrainosus: Secondary | ICD-10-CM

## 2021-04-20 DIAGNOSIS — M8589 Other specified disorders of bone density and structure, multiple sites: Secondary | ICD-10-CM

## 2021-04-20 DIAGNOSIS — M159 Polyosteoarthritis, unspecified: Secondary | ICD-10-CM

## 2021-04-20 DIAGNOSIS — M8949 Other hypertrophic osteoarthropathy, multiple sites: Secondary | ICD-10-CM

## 2021-04-20 DIAGNOSIS — K219 Gastro-esophageal reflux disease without esophagitis: Secondary | ICD-10-CM

## 2021-04-20 DIAGNOSIS — F411 Generalized anxiety disorder: Secondary | ICD-10-CM

## 2021-04-20 LAB — CYTOLOGY - PAP
Adequacy: ABSENT
Diagnosis: NEGATIVE

## 2021-04-20 NOTE — Chronic Care Management (AMB) (Signed)
Chronic Care Management    Clinical Social Work Note  04/20/2021 Name: Sandra Adams MRN: [id number] DOB: 08-09-1966  [name] is a 55 y.o. year old female who is a primary care patient of Sharion Balloon, FNP. The CCM team was consulted to assist the patient with chronic disease management and/or care coordination needs related to: [employer] .   Engaged with patient by telephone for follow up visit in response to provider referral for social work chronic care management and care coordination services.   Consent to Services:  The patient was given information about Chronic Care Management services, agreed to services, and gave verbal consent prior to initiation of services.  Please see initial visit note for detailed documentation.   Patient agreed to services and consent obtained.   Assessment: Review of patient past medical history, allergies, medications, and health status, including review of relevant consultants reports was performed today as part of a comprehensive evaluation and provision of chronic care management and care coordination services.     SDOH (Social Determinants of Health) assessments and interventions performed:  SDOH Interventions   Flowsheet Row Most Recent Value  SDOH Interventions   Depression Interventions/Treatment  Medication       Advanced Directives Status: See [employer]yn[employer] application for related entries.  CCM Care Plan  Allergies  Allergen Reactions  . Ciprofloxacin     Makes her feel weak,dizzy  . Imitrex [Sumatriptan]     Blurred vision   . Prednisone Nausea And Vomiting  . Tylenol [Acetaminophen]     Tylenol #3,Headaches  . Vicodin [Hydrocodone-Acetaminophen]     GI upset    Outpatient Encounter Medications as of 04/20/2021  Medication Sig  . ALPRAZolam (XANAX) 0.25 MG tablet Take 1 tablet (0.25 mg total) by mouth 2 (two) times daily as needed for anxiety.  . busPIRone (BUSPAR) 5 MG tablet Take 1 tablet (5 mg total) by mouth  2 (two) times daily as needed.  . fluticasone (FLONASE) 50 MCG/ACT nasal spray Place 2 sprays into both nostrils daily.  Marland Kitchen ibuprofen (ADVIL) 600 MG tablet TAKE 1 TABLET BY MOUTH EVERY 8 HOURS AS NEEDED FOR MILD OR MODERATE PAIN  . traMADol (ULTRAM) 50 MG tablet Take 1 tablet (50 mg total) by mouth every 6 (six) hours as needed.  . traZODone (DESYREL) 50 MG tablet Take 1-2 tablets (50-100 mg total) by mouth at bedtime as needed. for sleep  . venlafaxine XR (EFFEXOR-XR) 37.5 MG 24 hr capsule TAKE 1 CAPSULE BY MOUTH DAILY WITH BREAKFAST.   No facility-administered encounter medications on file as of 04/20/2021.    Patient Active Problem List   Diagnosis Date Noted  . Osteoarthritis 11/06/2019  . Osteopenia 07/21/2017  . Chronic back pain 12/31/2016  . Controlled substance agreement signed 12/31/2016  . Opioid dependence with opioid-induced disorder ([place]) 12/31/2016  . Overweight (BMI 25.0-29.9) 07/02/2016  . GERD (gastroesophageal reflux disease) 10/01/2014  . GAD (generalized anxiety disorder) 10/01/2014  . Depression 10/01/2014  . Menopausal hot flushes 09/16/2014  . Dysmenorrhea 05/20/2014  . Migraine without aura 03/27/2014    Conditions to be addressed/monitored: Monitor client management of anxiety and stress issues faced   Care Plan : LCSW Care Plan  Updates made by Katha Cabal, LCSW since 04/20/2021 12:00 AM    Problem: Emotional Distress     Goal: Emotional Health Supported;Manage anxiety issues faced   Start Date: 03/16/2021  Expected End Date: 06/16/2021  This Visit's Progress: Not on track  Recent Progress: Not  on track  Priority: High  Note:   Current Barriers:  . Chronic Mental Health needs related to anxiety and stress issues . Concerns about social isolation . Suicidal Ideation/Homicidal Ideation: No . Pain issues faced  Clinical Social Work Goal(s):  . patient will work with SW monthly by telephone or in person to reduce or manage symptoms related to  anxiety or stress issues . Patient will try to attend scheduled medical appointments in next 30 days . Patient will call RNCM or LCSW as needed for support in next 30 days  Interventions: . 1:1 collaboration with Sharion Balloon, FNP regarding development and update of comprehensive plan of care as evidenced by provider attestation and co-signature . Client and LCSW talked about pain issues of client (client talked about migraines faced) . Talked with client about transport needs of client.(uses [employer] bus) . Talked with client about appetite of client . Talked with client about sleeping issues of client . Talked with client about ambulation of client . Talked with client about mood of client and client management of mood . Talked with client about anxiety issues of client (client spoke of panic episodes faced) . Talked with client about knee issues on left knee . Provided counseling support for client . Talked with client about self care (getting adequate rest, allowing time for relaxation and hobbies, eating meals on normal schedule) . Talked with client about family support and support from friends . Talked with client about relaxation techniques (listens to music, exercises) . Talked with client about upcoming client medical appointments . Talked with client about management of health issues faced . Talked with client about support from [facility] for client nursing needs . Collaborated with RNCM regarding needs of client  Patient Self Care Activities:  Likes to walk her dog Eats meals independently Talks with family and friends via phone  Patient Coping Strengths:  . Support of friends . Does ADLs independently . Eats meals independently  Patient Self Care Deficits:  . Transport needs . Pain issues  Patient Goals:  - spend time or talk with others every day - practice relaxation or meditation daily - keep a calendar with appointment dates  Follow Up Plan:  LCSW to call client  on 05/28/21    Norva Riffle.Mirabelle Cyphers MSW, LCSW Licensed Clinical Social Worker [facility] Care Management [phone]

## 2021-04-20 NOTE — Patient Instructions (Signed)
Visit Information  PATIENT GOALS: Goals Addressed            This Visit's Progress   . Manage Anxiety and Stress issues faced       Timeframe:  Short-Term Goal Priority:  High Progress: Not On Track Start Date:      03/16/21                       Expected End Date:          06/16/21             Follow Up Date 05/28/21   Manage My Emotions (Patient) Manage anxiety and stress issues faced    Why is this important?    When you are stressed, down or upset, your body reacts too.   For example, your blood pressure may get higher; you may have a headache or stomachache.   When your emotions get the best of you, your body's ability to fight off cold and flu gets weak.   These steps will help you manage your emotions.     Patient Self Care Activities:  Likes to walk her dog Eats meals independently Talks with family and friends via phone  Patient Coping Strengths:  . Support of friends . Does ADLs independently . Eats meals independently  Patient Self Care Deficits:  . Transport needs . Pain issues  Patient Goals:  - spend time or talk with others every day - practice relaxation or meditation daily - keep a calendar with appointment dates  Follow Up Plan:  LCSW to call client on 05/28/21      Norva Riffle.Candis Kabel MSW, LCSW Licensed Clinical Social Worker [facility] Care Management [phone]

## 2021-04-27 ENCOUNTER — Other Ambulatory Visit: Payer: Self-pay

## 2021-04-27 ENCOUNTER — Encounter: Payer: Self-pay | Admitting: Family Medicine

## 2021-04-27 ENCOUNTER — Ambulatory Visit (HOSPITAL_COMMUNITY)
Admission: RE | Admit: 2021-04-27 | Discharge: 2021-04-27 | Disposition: A | Discharge status: Home / Self Care | Payer: Medicare Other | Source: Ambulatory Visit | Attending: Family | Admitting: Family

## 2021-04-27 DIAGNOSIS — R748 Abnormal levels of other serum enzymes: Secondary | ICD-10-CM | POA: Insufficient documentation

## 2021-04-27 DIAGNOSIS — K760 Fatty (change of) liver, not elsewhere classified: Secondary | ICD-10-CM | POA: Diagnosis not present

## 2021-04-27 DIAGNOSIS — K7689 Other specified diseases of liver: Secondary | ICD-10-CM | POA: Diagnosis not present

## 2021-04-29 ENCOUNTER — Telehealth: Payer: Self-pay

## 2021-04-29 NOTE — Telephone Encounter (Signed)
Patient aware of pap smear results and ultra sound result.

## 2021-04-30 ENCOUNTER — Encounter: Payer: Self-pay | Admitting: Physician Assistant

## 2021-04-30 ENCOUNTER — Telehealth: Payer: Medicare Other | Admitting: *Deleted

## 2021-05-10 DIAGNOSIS — R109 Unspecified abdominal pain: Secondary | ICD-10-CM | POA: Diagnosis not present

## 2021-05-10 DIAGNOSIS — R300 Dysuria: Secondary | ICD-10-CM | POA: Diagnosis not present

## 2021-05-10 DIAGNOSIS — R350 Frequency of micturition: Secondary | ICD-10-CM | POA: Diagnosis not present

## 2021-05-13 ENCOUNTER — Telehealth: Payer: Self-pay | Admitting: Family

## 2021-05-13 NOTE — Telephone Encounter (Signed)
Pt thinks she may of had a kidney stone but possibly passed it and took a picture of it. She wants to see [facility] tomorrow. Pt scheduled with Christy tomorrow at 9:40.

## 2021-05-14 ENCOUNTER — Ambulatory Visit: Payer: Medicare Other | Admitting: Family

## 2021-05-15 ENCOUNTER — Encounter: Payer: Self-pay | Admitting: Family

## 2021-05-25 ENCOUNTER — Ambulatory Visit: Payer: Medicare Other | Admitting: Physician Assistant

## 2021-05-27 ENCOUNTER — Other Ambulatory Visit: Payer: Self-pay | Admitting: Family

## 2021-05-27 DIAGNOSIS — F331 Major depressive disorder, recurrent, moderate: Secondary | ICD-10-CM

## 2021-05-27 DIAGNOSIS — F411 Generalized anxiety disorder: Secondary | ICD-10-CM

## 2021-05-28 ENCOUNTER — Ambulatory Visit (INDEPENDENT_AMBULATORY_CARE_PROVIDER_SITE_OTHER): Payer: Medicare Other | Admitting: Licensed Clinical Social Worker

## 2021-05-28 ENCOUNTER — Telehealth: Payer: Self-pay | Admitting: *Deleted

## 2021-05-28 DIAGNOSIS — M8589 Other specified disorders of bone density and structure, multiple sites: Secondary | ICD-10-CM

## 2021-05-28 DIAGNOSIS — K219 Gastro-esophageal reflux disease without esophagitis: Secondary | ICD-10-CM

## 2021-05-28 DIAGNOSIS — F331 Major depressive disorder, recurrent, moderate: Secondary | ICD-10-CM

## 2021-05-28 DIAGNOSIS — G43009 Migraine without aura, not intractable, without status migrainosus: Secondary | ICD-10-CM

## 2021-05-28 DIAGNOSIS — F411 Generalized anxiety disorder: Secondary | ICD-10-CM

## 2021-05-28 DIAGNOSIS — M8949 Other hypertrophic osteoarthropathy, multiple sites: Secondary | ICD-10-CM

## 2021-05-28 DIAGNOSIS — M159 Polyosteoarthritis, unspecified: Secondary | ICD-10-CM

## 2021-05-28 NOTE — Chronic Care Management (AMB) (Signed)
Chronic Care Management    Clinical Social Work Note  05/28/2021 Name: JELENE ALBANO MRN: [id number] DOB: [birth year]  [name] is a 55 y.o. year old female who is a primary care patient of Sharion Balloon, FNP. The CCM team was consulted to assist the patient with chronic disease management and/or care coordination needs related to: [employer] .   Engaged with patient by telephone for follow up visit in response to provider referral for social work chronic care management and care coordination services.   Consent to Services:  The patient was given information about Chronic Care Management services, agreed to services, and gave verbal consent prior to initiation of services.  Please see initial visit note for detailed documentation.   Patient agreed to services and consent obtained.   Assessment: Review of patient past medical history, allergies, medications, and health status, including review of relevant consultants reports was performed today as part of a comprehensive evaluation and provision of chronic care management and care coordination services.     SDOH (Social Determinants of Health) assessments and interventions performed:  SDOH Interventions    Flowsheet Row Most Recent Value  SDOH Interventions   Depression Interventions/Treatment  Medication        Advanced Directives Status: See Vynca application for related entries.  CCM Care Plan  Allergies  Allergen Reactions   Ciprofloxacin     Makes her feel weak,dizzy   Imitrex [Sumatriptan]     Blurred vision    Prednisone Nausea And Vomiting   Tylenol [Acetaminophen]     Tylenol #3,Headaches   Vicodin [Hydrocodone-Acetaminophen]     GI upset    Outpatient Encounter Medications as of 05/28/2021  Medication Sig   ALPRAZolam (XANAX) 0.25 MG tablet Take 1 tablet (0.25 mg total) by mouth 2 (two) times daily as needed for anxiety.   busPIRone (BUSPAR) 5 MG tablet TAKE 1 TABLET BY MOUTH 2 TIMES DAILY  AS NEEDED.   fluticasone (FLONASE) 50 MCG/ACT nasal spray Place 2 sprays into both nostrils daily.   ibuprofen (ADVIL) 600 MG tablet TAKE 1 TABLET BY MOUTH EVERY 8 HOURS AS NEEDED FOR MILD OR MODERATE PAIN   traMADol (ULTRAM) 50 MG tablet Take 1 tablet (50 mg total) by mouth every 6 (six) hours as needed.   traZODone (DESYREL) 50 MG tablet Take 1-2 tablets (50-100 mg total) by mouth at bedtime as needed. for sleep   venlafaxine XR (EFFEXOR-XR) 37.5 MG 24 hr capsule TAKE 1 CAPSULE BY MOUTH DAILY WITH BREAKFAST.   No facility-administered encounter medications on file as of 05/28/2021.    Patient Active Problem List   Diagnosis Date Noted   Osteoarthritis 11/06/2019   Osteopenia 07/21/2017   Chronic back pain 12/31/2016   Controlled substance agreement signed 12/31/2016   Opioid dependence with opioid-induced disorder ([place]) 12/31/2016   Overweight (BMI 25.0-29.9) 07/02/2016   GERD (gastroesophageal reflux disease) 10/01/2014   GAD (generalized anxiety disorder) 10/01/2014   Depression 10/01/2014   Menopausal hot flushes 09/16/2014   Dysmenorrhea 05/20/2014   Migraine without aura 03/27/2014    Conditions to be addressed/monitored: Monitor client management of anxiety and stress symptoms  Care Plan : LCSW Care Plan  Updates made by Katha Cabal, LCSW since 05/28/2021 12:00 AM     Problem: Emotional Distress      Goal: Emotional Health Supported;Manage anxiety issues faced   Start Date: 05/28/2021  Expected End Date: 08/28/2021  This Visit's Progress: On track  Recent Progress: Not on track  Priority: High  Note:   Current Barriers:  Chronic Mental Health needs related to anxiety and stress issues Concerns about social isolation Suicidal Ideation/Homicidal Ideation: No Pain issues faced  Clinical Social Work Goal(s):  patient will work with SW monthly by telephone or in person to reduce or manage symptoms related to anxiety or stress issues Patient will try to attend  scheduled medical appointments in next 30 days Patient will call RNCM or LCSW as needed for support in next 30 days  Interventions: 1:1 collaboration with Sharion Balloon, FNP regarding development and update of comprehensive plan of care as evidenced by provider attestation and co-signature Talked with client about her kidney issues  Talked with client about transport needs of client.(uses [employer] bus) Talked with client about appetite of client (decreased appetite) Talked with client about sleeping issues of client Talked with client about ambulation of client Talked with client about mood of client and client management of mood Talked with client about anxiety issues of client (client spoke of panic episodes faced) Talked with client about knee issues on left knee Talked with client about difficulty in bending over (pain in left knee and pain in her back) Provided counseling support for client Talked with client about self care (getting adequate rest, allowing time for relaxation and hobbies, eating meals on normal schedule) Talked with client about family support and support from friends Talked with Lenna about her decreased energy Talked with client about relaxation techniques (listens to music, exercises) Talked with client about upcoming client medical appointments Talked with client about management of health issues faced Talked with client about support from [facility] for client nursing needs Talked with client about vision of client Collaborated with RNCM about needs of client   Patient Self Care Activities:  Likes to walk her dog Eats meals independently Talks with family and friends via phone  Patient Coping Strengths:  Support of friends Does ADLs independently Eats meals independently  Patient Self Care Deficits:  Transport needs Pain issues  Patient Goals:  - spend time or talk with others every day - practice relaxation or meditation daily - keep a calendar with  appointment dates      Follow Up Plan: LCSW to call client on 07/10/21  Norva Riffle.Shean Gerding MSW, LCSW Licensed Clinical Social Worker [facility] Care Management [phone]

## 2021-05-28 NOTE — Telephone Encounter (Signed)
05/28/2021  LCSW spoke with patient today and she complains of back pain and knee pain and is requesting a script for a wheelchair. She is using a cane for ambulation at times now. Please schedule patient for evaluation. I would recommend physical therapy if PCP is agreeable instead of a wheelchair. I'm afraid she will be dependent on a wheelchair long term if she begins to use one.   Forwarding to [facility] Clinical Staff for review and action.   Chong Sicilian, BSN, RN-BC Embedded Chronic Care Manager [facility] / [place] Management Direct Dial: [phone]

## 2021-05-28 NOTE — Telephone Encounter (Signed)
Called patient--no answer and vm not set up.

## 2021-05-28 NOTE — Patient Instructions (Signed)
Visit Information   PATIENT GOALS:   Goals Addressed             This Visit's Progress    Manage Anxiety and Stress issues faced       Timeframe:  Short-Term Goal Priority:  High Progress: On Track Start Date:     05/28/21                   Expected End Date:         08/28/21             Follow Up Date 07/10/21   Manage My Emotions (Patient) Manage anxiety and stress issues faced    Why is this important?   When you are stressed, down or upset, your body reacts too.  For example, your blood pressure may get higher; you may have a headache or stomachache.  When your emotions get the best of you, your body's ability to fight off cold and flu gets weak.  These steps will help you manage your emotions.     Patient Self Care Activities:  Likes to walk her dog Eats meals independently Talks with family and friends via phone  Patient Coping Strengths:  Support of friends Does ADLs independently Eats meals independently  Patient Self Care Deficits:  Transport needs Pain issues  Patient Goals:  - spend time or talk with others every day - practice relaxation or meditation daily - keep a calendar with appointment dates  Follow Up Plan:  LCSW to call client on 07/10/21           Norva Riffle.Lelani Garnett MSW, LCSW Licensed Clinical Social Worker [facility] Care Management [phone]

## 2021-05-28 NOTE — Telephone Encounter (Signed)
Pt needs to be seen

## 2021-06-10 ENCOUNTER — Ambulatory Visit: Payer: Medicare Other | Admitting: Urology

## 2021-06-10 DIAGNOSIS — R350 Frequency of micturition: Secondary | ICD-10-CM

## 2021-06-15 ENCOUNTER — Ambulatory Visit: Payer: Medicare Other | Admitting: *Deleted

## 2021-06-15 DIAGNOSIS — M8949 Other hypertrophic osteoarthropathy, multiple sites: Secondary | ICD-10-CM | POA: Diagnosis not present

## 2021-06-15 DIAGNOSIS — F331 Major depressive disorder, recurrent, moderate: Secondary | ICD-10-CM | POA: Diagnosis not present

## 2021-06-15 DIAGNOSIS — F411 Generalized anxiety disorder: Secondary | ICD-10-CM

## 2021-06-19 ENCOUNTER — Encounter: Payer: Self-pay | Admitting: *Deleted

## 2021-06-19 NOTE — Chronic Care Management (AMB) (Signed)
Chronic Care Management   CCM RN Visit Note  06/15/21 Name: HAADIYA FROGGE MRN: [id number] DOB: 05-06-1966  Subjective: [name] is a 55 y.o. year old female who is a primary care patient of Sharion Balloon, FNP. The care management team was consulted for assistance with disease management and care coordination needs.    Engaged with patient by telephone for follow up visit in response to provider referral for case management and/or care coordination services.   Consent to Services:  The patient was given information about Chronic Care Management services, agreed to services, and gave verbal consent prior to initiation of services.  Please see initial visit note for detailed documentation.   Patient agreed to services and verbal consent obtained.   Assessment: Review of patient past medical history, allergies, medications, health status, including review of consultants reports, laboratory and other test data, was performed as part of comprehensive evaluation and provision of chronic care management services.   SDOH (Social Determinants of Health) assessments and interventions performed:    CCM Care Plan  Allergies  Allergen Reactions   Ciprofloxacin     Makes her feel weak,dizzy   Imitrex [Sumatriptan]     Blurred vision    Prednisone Nausea And Vomiting   Tylenol [Acetaminophen]     Tylenol #3,Headaches   Vicodin [Hydrocodone-Acetaminophen]     GI upset    Outpatient Encounter Medications as of 06/15/2021  Medication Sig   ALPRAZolam (XANAX) 0.25 MG tablet Take 1 tablet (0.25 mg total) by mouth 2 (two) times daily as needed for anxiety.   busPIRone (BUSPAR) 5 MG tablet TAKE 1 TABLET BY MOUTH 2 TIMES DAILY AS NEEDED.   fluticasone (FLONASE) 50 MCG/ACT nasal spray Place 2 sprays into both nostrils daily.   ibuprofen (ADVIL) 600 MG tablet TAKE 1 TABLET BY MOUTH EVERY 8 HOURS AS NEEDED FOR MILD OR MODERATE PAIN   traMADol (ULTRAM) 50 MG tablet Take 1 tablet (50 mg  total) by mouth every 6 (six) hours as needed.   traZODone (DESYREL) 50 MG tablet Take 1-2 tablets (50-100 mg total) by mouth at bedtime as needed. for sleep   venlafaxine XR (EFFEXOR-XR) 37.5 MG 24 hr capsule TAKE 1 CAPSULE BY MOUTH DAILY WITH BREAKFAST.   No facility-administered encounter medications on file as of 06/15/2021.    Patient Active Problem List   Diagnosis Date Noted   Osteoarthritis 11/06/2019   Osteopenia 07/21/2017   Chronic back pain 12/31/2016   Controlled substance agreement signed 12/31/2016   Opioid dependence with opioid-induced disorder ([place]) 12/31/2016   Overweight (BMI 25.0-29.9) 07/02/2016   GERD (gastroesophageal reflux disease) 10/01/2014   GAD (generalized anxiety disorder) 10/01/2014   Depression 10/01/2014   Menopausal hot flushes 09/16/2014   Dysmenorrhea 05/20/2014   Migraine without aura 03/27/2014    Conditions to be addressed/monitored:Anxiety, Depression, and need for updated health maintenance items  Care Plan : RNCM: Wellness (Adult)     Problem: Health Promotion & Health Maintenance   Priority: Medium     Long-Range Goal: Management Plan Developed   Start Date: 04/15/2021  This Visit's Progress: On track  Recent Progress: On track  Priority: Medium  Note:   Current Barriers:  Ineffective Self Health Maintenance  Transportation barriers Frequently cancels appointments Unable to independently drive  Nurse Case Manager Clinical Goal(s):  patient will work with PCP and [facility] Clinical Staff to address needs related to completion of health maintenance items and follow-up abnormal lab and imaging results patient will meet with  RN Care Manager to address self-management of chronic conditions and development of self-management plan  Interventions:  1:1 collaboration with Sharion Balloon, FNP regarding development and update of comprehensive plan of care as evidenced by provider attestation and co-signature Inter-disciplinary care team  collaboration (see longitudinal plan of care) Chart reviewed including relevant office notes, health maintenance check list, imaging reports, and lab results Reviewed and discussed medications with patient Compliant and no difficulty with affordability. Insurance pays 100%. Discussed family and social support Has support from mother, boyfriend, and friends Confirmed that patient did recently have a complete physical including a pap smear Reviewed and discussed Health Maintenance items:  [facility] Patient has been outreached to schedule this Overdue for colonoscopy Referral to GI was placed Reinforced need to avoid tylenol products Reinforced a healthy diet that is high in fiber, fruits, vegetables, lean proteins and healthy fats and to avoid simple carbohydrates, fast food, and fried/greasy foods Discussed plans with patient for ongoing care management follow up and provided patient with direct contact information for care management team  Self Care Activities:  Self administers medications as prescribed Calls pharmacy for medication refills Performs ADL's independently Calls provider office for new concerns or questions  Patient Goals: Over the next 30 days, patient will: Take medication as prescribed Talk with LCSW regarding psychosocial issues Eat a healthy diet that is high in fiber, fruits, vegetables, lean proteins and healthy fats and to avoid simple carbohydrates (sugar, white bread, pasta, etc), fast food, and fried/greasy foods Schedule mammogram Schedule colonoscopy Discuss medical concerns with PCP Call RN Care Manager as needed  Follow Up Plan:  Telephone follow up appointment with care management team member scheduled for: 07/22/21 with [facility] The patient has been provided with contact information for the care management team and has been advised to call with any health related questions or concerns.       Plan:Telephone follow up appointment  with care management team member scheduled for:  07/22/21 with RNCM  Chong Sicilian, BSN, RN-BC [place] / [place] Management Direct Dial: [phone]

## 2021-06-19 NOTE — Patient Instructions (Signed)
Visit Information  PATIENT GOALS:  Goals Addressed             This Visit's Progress    Management Plan Developed   On track    Timeframe:  Long-Range Goal Priority:  Medium Start Date:   04/15/21                          Expected End Date:                       Follow-up: 07/22/21  Take medication as prescribed Talk with LCSW regarding psychosocial issues Eat a healthy diet that is high in fiber, fruits, vegetables, lean proteins and healthy fats and to avoid simple carbohydrates (sugar, white bread, pasta, etc), fast food, and fried/greasy foods Schedule mammogram Schedule colonoscopy Discuss medical concerns with PCP Call [place] as needed         Patient verbalizes understanding of instructions provided today and agrees to view in [place].   Telephone follow up appointment with care management team member scheduled for: 07/22/21 with RNCM  Chong Sicilian, BSN, RN-BC [place] / [place] Management Direct Dial: [phone]

## 2021-06-23 DIAGNOSIS — H10231 Serous conjunctivitis, except viral, right eye: Secondary | ICD-10-CM | POA: Diagnosis not present

## 2021-06-24 DIAGNOSIS — Z1231 Encounter for screening mammogram for malignant neoplasm of breast: Secondary | ICD-10-CM | POA: Diagnosis not present

## 2021-06-25 DIAGNOSIS — R928 Other abnormal and inconclusive findings on diagnostic imaging of breast: Secondary | ICD-10-CM

## 2021-07-10 ENCOUNTER — Telehealth: Payer: Medicare Other

## 2021-07-22 ENCOUNTER — Telehealth: Payer: Self-pay | Admitting: *Deleted

## 2021-07-22 ENCOUNTER — Telehealth: Payer: Medicare Other | Admitting: *Deleted

## 2021-07-23 NOTE — Telephone Encounter (Signed)
  Care Management   Follow Up Note   07/22/2021 Name: Sandra Adams MRN: [id number] DOB: 06-16-1966   Referred by: Sharion Balloon, FNP Reason for referral : No chief complaint on file.   An unsuccessful telephone outreach was attempted today. The patient was referred to the case management team for assistance with care management and care coordination.   Follow Up Plan: Forwarding to [facility] Care Guides for outreach and rescheduling.   Chong Sicilian, BSN, RN-BC Embedded Chronic Care Manager [facility] / [place] Management Direct Dial: [phone]

## 2021-07-29 ENCOUNTER — Ambulatory Visit (INDEPENDENT_AMBULATORY_CARE_PROVIDER_SITE_OTHER): Payer: Medicare Other | Admitting: Nurse Practitioner

## 2021-07-29 ENCOUNTER — Other Ambulatory Visit: Payer: Self-pay

## 2021-07-29 VITALS — BP 114/80 | HR 93 | Temp 97.4°F | Ht 62.0 in | Wt 140.0 lb

## 2021-07-29 DIAGNOSIS — L299 Pruritus, unspecified: Secondary | ICD-10-CM | POA: Diagnosis not present

## 2021-07-29 MED ORDER — HYDROCORTISONE 1 % EX LOTN: 1.0000 "application " | TOPICAL_LOTION | Freq: Two times a day (BID) | CUTANEOUS | 0 refills | Status: DC

## 2021-07-29 MED ORDER — CETIRIZINE HCL 10 MG PO TABS: 10.0000 mg | ORAL_TABLET | Freq: Every day | ORAL | 0 refills | Status: DC

## 2021-07-29 NOTE — Assessment & Plan Note (Signed)
pruritus from fleabites.  Symptoms not well controlled.  Patient unable to take steroids.  Started hydrocortisone cream, oatmeal soak, moisturizer, and Zyrtec 10 mg tablet by mouth daily.  Follow-up for worsening unresolved symptoms.

## 2021-07-29 NOTE — Progress Notes (Signed)
Acute Office Visit  Subjective:    Patient ID: Sandra Adams, female    DOB: Apr 05, 1966, 55 y.o.   MRN: [id number]  Chief Complaint  Patient presents with   Rash    Rash This is a new problem. The current episode started yesterday. The problem has been gradually worsening since onset. The affected locations include the left lower leg and right lower leg. The rash is characterized by dryness and itchiness. She was exposed to an insect bite/sting. Pertinent negatives include no anorexia, congestion, cough, facial edema, fever or shortness of breath. Past treatments include nothing.    Past Medical History:  Diagnosis Date   Anxiety    Arthritis    Depression    Menopausal symptom    Migraine    Scoliosis     Past Surgical History:  Procedure Laterality Date   KNEE SURGERY Left 1974 and 1979    Family History  Problem Relation Age of Onset   Epilepsy Mother    COPD Father    Hyperlipidemia Father    Heart disease Father    Cancer Father    COPD Sister    Anxiety disorder Sister    COPD Maternal Aunt    Diabetes Maternal Uncle    Diabetes Maternal Aunt        x2   Colon cancer Neg Hx    Colon polyps Neg Hx    Esophageal cancer Neg Hx    Gallbladder disease Neg Hx     Social History   Socioeconomic History   Marital status: Divorced    Spouse name: Not on file   Number of children: 0   Years of education: 12th   Highest education level: High school graduate  Occupational History   Occupation: [employer]    Comment: Back : [employer] Knee surgery   Tobacco Use   Smoking status: Never   Smokeless tobacco: Never  Vaping Use   Vaping Use: Never used  Substance and Sexual Activity   Alcohol use: No   Drug use: No   Sexual activity: Not Currently    Partners: Male    Birth control/protection: None  Other Topics Concern   Not on file  Social History Narrative   Single,no children   Right handed   12 th grade   No caffeine   Social Determinants of  Radio broadcast assistant Strain: Low Risk    Difficulty of Paying Living Expenses: Not very hard  Food Insecurity: No Food Insecurity   Worried About Running Out of Food in the Last Year: Never true   Ran Out of Food in the Last Year: Never true  Transportation Needs: No Transportation Needs   Lack of Transportation (Medical): No   Lack of Transportation (Non-Medical): No  Physical Activity: Not on file  Stress: Not on file  Social Connections: Moderately Isolated   Frequency of Communication with Friends and Family: More than three times a week   Frequency of Social Gatherings with Friends and Family: More than three times a week   Attends Religious Services: More than 4 times per year   Active Member of [employer] or Organizations: No   Attends Archivist Meetings: Never   Marital Status: Divorced  Human resources officer Violence: Not on file    Outpatient Medications Prior to Visit  Medication Sig Dispense Refill   ALPRAZolam (XANAX) 0.25 MG tablet Take 1 tablet (0.25 mg total) by mouth 2 (two) times daily as needed for anxiety. [place]  tablet 5   busPIRone (BUSPAR) 5 MG tablet TAKE 1 TABLET BY MOUTH 2 TIMES DAILY AS NEEDED. 180 tablet 0   fluticasone (FLONASE) 50 MCG/ACT nasal spray Place 2 sprays into both nostrils daily. 16 g 6   ibuprofen (ADVIL) 600 MG tablet TAKE 1 TABLET BY MOUTH EVERY 8 HOURS AS NEEDED FOR MILD OR MODERATE PAIN 90 tablet 0   traMADol (ULTRAM) 50 MG tablet Take 1 tablet (50 mg total) by mouth every 6 (six) hours as needed. 30 tablet 2   traZODone (DESYREL) 50 MG tablet Take 1-2 tablets (50-100 mg total) by mouth at bedtime as needed. for sleep 180 tablet 2   venlafaxine XR (EFFEXOR-XR) 37.5 MG 24 hr capsule TAKE 1 CAPSULE BY MOUTH DAILY WITH BREAKFAST. 90 capsule 1   No facility-administered medications prior to visit.    Allergies  Allergen Reactions   Ciprofloxacin     Makes her feel weak,dizzy   Imitrex [Sumatriptan]     Blurred vision     Prednisone Nausea And Vomiting   Tylenol [Acetaminophen]     Tylenol #3,Headaches   Vicodin [Hydrocodone-Acetaminophen]     GI upset    Review of Systems  Constitutional:  Negative for fever.  HENT:  Negative for congestion.   Eyes: Negative.   Respiratory:  Negative for cough and shortness of breath.   Gastrointestinal:  Negative for anorexia.  Skin:  Positive for rash.  All other systems reviewed and are negative.     Objective:    Physical Exam Vitals reviewed.  Constitutional:      Appearance: Normal appearance.  HENT:     Head: Normocephalic.     Mouth/Throat:     Mouth: Mucous membranes are moist.     Pharynx: Oropharynx is clear.  Eyes:     Conjunctiva/sclera: Conjunctivae normal.  Cardiovascular:     Rate and Rhythm: Normal rate and regular rhythm.  Pulmonary:     Breath sounds: Normal breath sounds.  Abdominal:     General: Bowel sounds are normal.  Skin:    General: Skin is dry.     Findings: Rash present.  Neurological:     Mental Status: She is oriented to person, place, and time.  Psychiatric:        Behavior: Behavior normal.    BP 114/80   Pulse 93   Temp (!) 97.4 F (36.3 C) (Temporal)   Ht 5' 2"  (1.575 m)   Wt 140 lb (63.5 kg)   LMP 10/16/2014   SpO2 97%   BMI 25.61 kg/m  Wt Readings from Last 3 Encounters:  07/29/21 140 lb (63.5 kg)  04/16/21 148 lb 9.6 oz (67.4 kg)  07/31/20 149 lb 6.4 oz (67.8 kg)    Health Maintenance Due  Topic Date Due   Zoster Vaccines- Shingrix (1 of 2) Never done   TETANUS/TDAP  12/20/2017   DEXA SCAN  05/10/2019   Fecal DNA (Cologuard)  01/17/2021    There are no preventive care reminders to display for this patient.   Lab Results  Component Value Date   TSH 2.270 04/16/2021   Lab Results  Component Value Date   WBC 10.0 04/16/2021   HGB 14.6 04/16/2021   HCT 43.4 04/16/2021   MCV 95 04/16/2021   PLT 243 04/16/2021   Lab Results  Component Value Date   NA 141 04/16/2021   K 3.9  04/16/2021   CO2 25 04/16/2021   GLUCOSE 80 04/16/2021   BUN 8 04/16/2021  CREATININE 0.77 04/16/2021   BILITOT 0.2 04/16/2021   ALKPHOS 134 (H) 04/16/2021   AST 28 04/16/2021   ALT 52 (H) 04/16/2021   PROT 7.2 04/16/2021   ALBUMIN 4.7 04/16/2021   CALCIUM 9.6 04/16/2021   EGFR 90 04/16/2021   Lab Results  Component Value Date   CHOL 209 (H) 04/16/2021   Lab Results  Component Value Date   HDL 53 04/16/2021   Lab Results  Component Value Date   LDLCALC 125 (H) 04/16/2021   Lab Results  Component Value Date   TRIG 175 (H) 04/16/2021   Lab Results  Component Value Date   CHOLHDL 3.9 04/16/2021   No results found for: HGBA1C     Assessment & Plan:   Problem List Items Addressed This Visit       Musculoskeletal and Integument   Pruritus - Primary    pruritus from fleabites.  Symptoms not well controlled.  Patient unable to take steroids.  Started hydrocortisone cream, oatmeal soak, moisturizer, and Zyrtec 10 mg tablet by mouth daily.  Follow-up for worsening unresolved symptoms.       Relevant Medications   cetirizine (ZYRTEC ALLERGY) 10 MG tablet   hydrocortisone 1 % lotion     Meds ordered this encounter  Medications   cetirizine (ZYRTEC ALLERGY) 10 MG tablet    Sig: Take 1 tablet (10 mg total) by mouth daily.    Dispense:  30 tablet    Refill:  0    Order Specific Question:   Supervising Provider    Answer:   Janora Norlander [[id number]]   hydrocortisone 1 % lotion    Sig: Apply 1 application topically 2 (two) times daily.    Dispense:  118 mL    Refill:  0    Order Specific Question:   Supervising Provider    Answer:   Janora Norlander [[id number]]     Ivy Lynn, NP

## 2021-07-29 NOTE — Patient Instructions (Addendum)
Pruritus Pruritus is an itchy feeling on the skin. One of the most common causes is dry skin, but many different things can cause itching. Most cases of itching do notrequire medical attention. Sometimes itchy skin can turn into a rash. Follow these instructions at home: Skin care  Apply moisturizing lotion to your skin as needed. Lotion that contains petroleum jelly is best. Take medicines or apply medicated creams only as told by your health care provider. This may include: Corticosteroid cream. Anti-itch lotions. Oral antihistamines. Apply a cool, wet cloth (cool compress) to the affected areas. Take baths with one of the following: Epsom salts. You can get these at your local pharmacy or grocery store. Follow the instructions on the packaging. Baking soda. Pour a small amount into the bath as told by your health care provider. Colloidal oatmeal. You can get this at your local pharmacy or grocery store. Follow the instructions on the packaging. Apply baking soda paste to your skin. To make the paste, stir water into a small amount of baking soda until it reaches a paste-like consistency. Do not scratch your skin. Do not take hot showers or baths, which can make itching worse. A cool shower may help with itching as long as you apply moisturizing lotion after the shower. Do not use scented soaps, detergents, perfumes, and cosmetic products. Instead, use gentle, unscented versions of these items.  General instructions Avoid wearing tight clothes. Keep a journal to help find out what is causing your itching. Write down: What you eat and drink. What cosmetic products you use. What soaps or detergents you use. What you wear, including jewelry. Use a humidifier. This keeps the air moist, which helps to prevent dry skin. Be aware of any changes in your itchiness. Contact a health care provider if: The itching does not go away after several days. You are unusually thirsty or urinating more  than normal. Your skin tingles or feels numb. Your skin or the white parts of your eyes turn yellow (jaundice). You feel weak. You have any of the following: Night sweats. Tiredness (fatigue). Weight loss. Abdominal pain. Summary Pruritus is an itchy feeling on the skin. One of the most common causes is dry skin, but many different conditions and factors can cause itching. Apply moisturizing lotion to your skin as needed. Lotion that contains petroleum jelly is best. Take medicines or apply medicated creams only as told by your health care provider. Do not take hot showers or baths. Do not use scented soaps, detergents, perfumes, or cosmetic products. This information is not intended to replace advice given to you by your health care provider. Make sure you discuss any questions you have with your healthcare provider. Document Revised: 12/20/2017 Document Reviewed: 12/20/2017 Elsevier Patient Education  2022 [place], Adult An insect bite can make your skin red, itchy, and swollen. Some insects can spread disease to people with a bite. However, most insect bites do not lead todisease, and most are not serious. What are the causes? Insects may bite for many reasons, including: Hunger. To defend themselves. Insects that bite include: Spiders. Mosquitoes. Ticks. Fleas. Ants. Flies. Kissing bugs. Chiggers. What are the signs or symptoms? Symptoms of this condition include: Itching or pain in the bite area. Redness and swelling in the bite area. An open wound (skin ulcer). Symptoms often last for 2-4 days. In rare cases, a person may have a very bad allergic reaction (anaphylactic reaction) to a bite. Symptoms of an anaphylactic reaction may include:  Feeling warm in the face (flushed). Your face may turn red. Itchy, red, swollen areas of skin (hives). Swelling of the: Eyes. Lips. Face. Mouth. Tongue. Throat. Trouble with any of  these: Breathing. Talking. Swallowing. Loud breathing (wheezing). Feeling dizzy or light-headed. Passing out (fainting). Pain or cramps in your belly. Throwing up (vomiting). Watery poop (diarrhea). How is this treated? Treatment is usually not needed. Symptoms often go away on their own. When treatment is needed, it may involve: Putting a cream or lotion on the bite area. This helps with itching. Taking an antibiotic medicine. This treatment is needed if the bite area gets infected. Getting a tetanus shot, if you are not up to date on this vaccine. Putting ice on the affected area. Using medicines called antihistamines. This treatment may be needed if you have itching or an allergic reaction to the insect bite. Giving yourself a shot of medicine (epinephrine) using an auto-injector "pen" if you have an anaphylactic reaction to a bite. Your doctor will teach you how to use this pen. Follow these instructions at home: Bite area care  Do not scratch the bite area. Keep the bite area clean and dry. Wash the bite area every day with soap and water as told by your doctor. Check the bite area every day for signs of infection. Check for: Redness, swelling, or pain. Fluid or blood. Warmth. Pus or a bad smell.  Managing pain, itching, and swelling  You may put any of these on the bite area as told by your doctor: A paste made of baking soda and water. Cortisone cream. Calamine lotion. If told, put ice on the bite area. Put ice in a plastic bag. Place a towel between your skin and the bag. Leave the ice on for 20 minutes, 2-3 times a day.  General instructions Apply or take over-the-counter and prescription medicines only as told by your doctor. If you were prescribed an antibiotic medicine, take or apply it as told by your doctor. Do not stop using the antibiotic even if your condition improves. Keep all follow-up visits as told by your doctor. This is important. How is this  prevented? To help you have a lower risk of insect bites: When you are outside, wear clothing that covers your arms and legs. Use insect repellent. The best insect repellents contain one of these: DEET. Picaridin. Oil of lemon eucalyptus (OLE). [id number]. Consider spraying your clothing with a pesticide called permethrin. Permethrin helps prevent insect bites. It works for several weeks and for up to 5-6 clothing washes. Do not apply permethrin directly to the skin. If your home windows do not have screens, think about putting some in. If you will be sleeping in an area where there are mosquitoes, consider covering your sleeping area with a mosquito net. Contact a doctor if: You have redness, swelling, or pain in the bite area. You have fluid or blood coming from the bite area. The bite area feels warm to the touch. You have pus or a bad smell coming from the bite area. You have a fever. Get help right away if: You have joint pain. You have a rash. You feel more tired or sleepy than you normally do. You have neck pain. You have a headache. You feel weaker than you normally do. You have signs of an anaphylactic reaction. Signs may include: Feeling warm in the face. Itchy, red, swollen areas of skin. Swelling of your: Eyes. Lips. Face. Mouth. Tongue. Throat. Trouble with any of these: Breathing.  Talking. Swallowing. Loud breathing. Feeling dizzy or light-headed. Passing out. Pain or cramps in your belly. Throwing up. Watery poop. These symptoms may be an emergency. Do not wait to see if the symptoms will go away. Do this right away: Use your auto-injector pen as you have been told. Get medical help. Call your local emergency services (911 in the [place] Do not drive yourself to the hospital. Summary An insect bite can make your skin red, itchy, and swollen. Treatment is usually not needed. Symptoms often go away on their own. Do not scratch the bite area. Keep it clean and  dry. Ice can help with pain and itching from the bite. This information is not intended to replace advice given to you by your health care provider. Make sure you discuss any questions you have with your healthcare provider. Document Revised: 10/28/2020 Document Reviewed: 06/16/2018 Elsevier Patient Education  2022 [employer].

## 2021-07-30 ENCOUNTER — Other Ambulatory Visit: Payer: Self-pay | Admitting: Family

## 2021-07-30 ENCOUNTER — Encounter: Payer: Self-pay | Admitting: Family

## 2021-07-30 ENCOUNTER — Ambulatory Visit (INDEPENDENT_AMBULATORY_CARE_PROVIDER_SITE_OTHER): Payer: Medicare Other | Admitting: Family

## 2021-07-30 DIAGNOSIS — G4700 Insomnia, unspecified: Secondary | ICD-10-CM

## 2021-07-30 DIAGNOSIS — R531 Weakness: Secondary | ICD-10-CM | POA: Diagnosis not present

## 2021-07-30 DIAGNOSIS — J301 Allergic rhinitis due to pollen: Secondary | ICD-10-CM | POA: Diagnosis not present

## 2021-07-30 DIAGNOSIS — R232 Flushing: Secondary | ICD-10-CM | POA: Diagnosis not present

## 2021-07-30 MED ORDER — DOXEPIN HCL 10 MG PO CAPS: 10.0000 mg | ORAL_CAPSULE | Freq: Every evening | ORAL | 2 refills | Status: DC | PRN

## 2021-07-30 MED ORDER — CETIRIZINE HCL 10 MG PO TABS
10.0000 mg | ORAL_TABLET | Freq: Every day | ORAL | 2 refills | Status: DC
Start: 2021-07-30 — End: 2022-02-19

## 2021-07-30 MED ORDER — FLUTICASONE PROPIONATE 50 MCG/ACT NA SUSP: 2.0000 | Freq: Every day | NASAL | 6 refills | Status: DC

## 2021-07-30 MED ORDER — COVID-19 AT-HOME TEST VI KIT: 1.0000 | PACK | 2 refills | Status: DC | PRN

## 2021-07-30 MED ORDER — DOXYCYCLINE HYCLATE 100 MG PO TABS: 100.0000 mg | ORAL_TABLET | Freq: Two times a day (BID) | ORAL | 0 refills | Status: DC

## 2021-07-30 NOTE — Progress Notes (Signed)
Virtual Visit  Note Due to COVID-19 pandemic this visit was conducted virtually. This visit type was conducted due to national recommendations for restrictions regarding the COVID-19 Pandemic (e.g. social distancing, sheltering in place) in an effort to limit this patient's exposure and mitigate transmission in our community. All issues noted in this document were discussed and addressed.  A physical exam was not performed with this format.  I connected with Sandra Adams on 07/30/21 at  3:06 pm  by telephone and verified that I am speaking with the correct person using two identifiers. Sandra Adams is currently located at [facility] and no one is currently with her during visit. The provider, [name], FNP is located in their office at time of visit.  I discussed the limitations, risks, security and privacy concerns of performing an evaluation and management service by telephone and the availability of in person appointments. I also discussed with the patient that there may be a patient responsible charge related to this service. The patient expressed understanding and agreed to proceed.   History and Present Illness:  HPI Pt calls today with weakness. She reports this started a hour ago after she ate [place] food. She states she ate a peppermint and started feeling better. Thinks she might have had a hot flash.    She is worried about COVID and wants a rx sent to the pharmacy for her to pick up a rx.   She wants to know what type of vitamin she should take that will help with her fatigue. She is currently taking a One a day.   She reports she has insomnia. She could not take the trazodone because it made her hear things.   She reports she has been trying to dance before bed to help get her tired.   She also requesting refill on her nasal spray and zyrtec.    Review of Systems  Constitutional:  Positive for malaise/fatigue.  All other systems reviewed and are  negative.   Observations/Objective: No SOB or distress noted, anxious.   Assessment and Plan: 1. Weakness - COVID-19 At-Home Test KIT; Place 1 kit into both nostrils as needed.  Dispense: 1 kit; Refill: 2  2. Hot flashes - COVID-19 At-Home Test KIT; Place 1 kit into both nostrils as needed.  Dispense: 1 kit; Refill: 2  3. Insomnia, unspecified type Will start doxepin Sleep ritual  Stress management  - doxepin (SINEQUAN) 10 MG capsule; Take 1 capsule (10 mg total) by mouth at bedtime as needed.  Dispense: 90 capsule; Refill: 2  4. Allergic rhinitis due to pollen, unspecified seasonality Continue zyrtec and flonase  - fluticasone (FLONASE) 50 MCG/ACT nasal spray; Place 2 sprays into both nostrils daily.  Dispense: 16 g; Refill: 6 - cetirizine (ZYRTEC ALLERGY) 10 MG tablet; Take 1 tablet (10 mg total) by mouth daily.  Dispense: 90 tablet; Refill: 2    I discussed the assessment and treatment plan with the patient. The patient was provided an opportunity to ask questions and all were answered. The patient agreed with the plan and demonstrated an understanding of the instructions.   The patient was advised to call back or seek an in-person evaluation if the symptoms worsen or if the condition fails to improve as anticipated.  The above assessment and management plan was discussed with the patient. The patient verbalized understanding of and has agreed to the management plan. Patient is aware to call the clinic if symptoms persist or worsen. Patient is aware  when to return to the clinic for a follow-up visit. Patient educated on when it is appropriate to go to the emergency department.   Time call ended:  3:28 pm   I provided 22 minutes of  non face-to-face time during this encounter.    [name], FNP

## 2021-07-30 NOTE — Patient Instructions (Signed)

## 2021-08-07 ENCOUNTER — Telehealth: Payer: Self-pay

## 2021-08-07 NOTE — Chronic Care Management (AMB) (Signed)
  Care Management   Note  08/07/2021 Name: Sandra Adams MRN: [id number] DOB: Jun 20, 1966  Sandra Adams is a 55 y.o. year old female who is a primary care patient of Sharion Balloon, FNP and is actively engaged with the care management team. I reached out to Sandra Adams by phone today to assist with re-scheduling a follow up visit with the RN Case Manager  Follow up plan: Unsuccessful telephone outreach attempt made. A HIPAA compliant phone message was left for the patient providing contact information and requesting a return call.  The care management team will reach out to the patient again over the next 7 days.  If patient returns call to provider office, please advise to call [facility]  at [place], [place], [place], [place] [address] Direct Dial: [phone] [email or web address] Website: [email or web address]

## 2021-08-13 DIAGNOSIS — L239 Allergic contact dermatitis, unspecified cause: Secondary | ICD-10-CM | POA: Diagnosis not present

## 2021-08-13 NOTE — Chronic Care Management (AMB) (Signed)
  Care Management   Note  08/13/2021 Name: Sandra Adams MRN: [id number] DOB: 05-Oct-1966  [name] is a 55 y.o. year old female who is a primary care patient of Sharion Balloon, FNP and is actively engaged with the care management team. I reached out to [name] by phone today to assist with re-scheduling a follow up visit with the RN Case Manager  Follow up plan: Telephone appointment with care management team member scheduled for:08/25/2021  Noreene Larsson, [place], [place], [address] [address] Direct Dial: [phone] [name]: [email or web address]

## 2021-08-17 DIAGNOSIS — R928 Other abnormal and inconclusive findings on diagnostic imaging of breast: Secondary | ICD-10-CM | POA: Diagnosis not present

## 2021-08-18 ENCOUNTER — Ambulatory Visit (INDEPENDENT_AMBULATORY_CARE_PROVIDER_SITE_OTHER): Payer: Medicare Other | Admitting: Family Medicine

## 2021-08-18 ENCOUNTER — Encounter: Payer: Self-pay | Admitting: Family Medicine

## 2021-08-18 ENCOUNTER — Other Ambulatory Visit: Payer: Self-pay

## 2021-08-18 ENCOUNTER — Ambulatory Visit (INDEPENDENT_AMBULATORY_CARE_PROVIDER_SITE_OTHER): Payer: Medicare Other | Admitting: Licensed Clinical Social Worker

## 2021-08-18 VITALS — BP 122/77 | HR 73 | Ht 62.0 in | Wt 140.0 lb

## 2021-08-18 DIAGNOSIS — F411 Generalized anxiety disorder: Secondary | ICD-10-CM

## 2021-08-18 DIAGNOSIS — H6501 Acute serous otitis media, right ear: Secondary | ICD-10-CM | POA: Diagnosis not present

## 2021-08-18 DIAGNOSIS — M159 Polyosteoarthritis, unspecified: Secondary | ICD-10-CM

## 2021-08-18 DIAGNOSIS — M8949 Other hypertrophic osteoarthropathy, multiple sites: Secondary | ICD-10-CM | POA: Diagnosis not present

## 2021-08-18 DIAGNOSIS — G43009 Migraine without aura, not intractable, without status migrainosus: Secondary | ICD-10-CM

## 2021-08-18 DIAGNOSIS — K219 Gastro-esophageal reflux disease without esophagitis: Secondary | ICD-10-CM

## 2021-08-18 DIAGNOSIS — F331 Major depressive disorder, recurrent, moderate: Secondary | ICD-10-CM | POA: Diagnosis not present

## 2021-08-18 DIAGNOSIS — M8589 Other specified disorders of bone density and structure, multiple sites: Secondary | ICD-10-CM

## 2021-08-18 MED ORDER — METHYLPREDNISOLONE ACETATE 40 MG/ML IJ SUSP
40.0000 mg | Freq: Once | INTRAMUSCULAR | Status: AC
  Administered 2021-08-18: 40 mg via INTRAMUSCULAR

## 2021-08-18 NOTE — Chronic Care Management (AMB) (Signed)
Chronic Care Management    Clinical Social Work Note  08/18/2021 Name: Sandra Adams MRN: [id number] DOB: [birth year]  Sandra Adams is a 55 y.o. year old female who is a primary care patient of Sandra Balloon, FNP. The CCM team was consulted to assist the patient with chronic disease management and/or care coordination needs related to: [employer] .   Engaged with patient by telephone for follow up visit in response to provider referral for social work chronic care management and care coordination services.   Consent to Services:  The patient was given information about Chronic Care Management services, agreed to services, and gave verbal consent prior to initiation of services.  Please see initial visit note for detailed documentation.   Patient agreed to services and consent obtained.   Assessment: Review of patient past medical history, allergies, medications, and health status, including review of relevant consultants reports was performed today as part of a comprehensive evaluation and provision of chronic care management and care coordination services.     SDOH (Social Determinants of Health) assessments and interventions performed:  SDOH Interventions    Flowsheet Row Most Recent Value  SDOH Interventions   Social Connections Interventions Other (Comment)  [at risk for social isolation]  Depression Interventions/Treatment  Medication        Advanced Directives Status: See Vynca application for related entries.  CCM Care Plan  Allergies  Allergen Reactions   Ciprofloxacin     Makes her feel weak,dizzy   Imitrex [Sumatriptan]     Blurred vision    Prednisone Nausea And Vomiting   Tylenol [Acetaminophen]     Tylenol #3,Headaches   Vicodin [Hydrocodone-Acetaminophen]     GI upset    Outpatient Encounter Medications as of 08/18/2021  Medication Sig   ALPRAZolam (XANAX) 0.25 MG tablet Take 1 tablet (0.25 mg total) by mouth 2 (two) times daily as needed  for anxiety.   busPIRone (BUSPAR) 5 MG tablet TAKE 1 TABLET BY MOUTH 2 TIMES DAILY AS NEEDED.   cetirizine (ZYRTEC ALLERGY) 10 MG tablet Take 1 tablet (10 mg total) by mouth daily.   fluticasone (FLONASE) 50 MCG/ACT nasal spray Place 2 sprays into both nostrils daily.   hydrocortisone 1 % lotion Apply 1 application topically 2 (two) times daily.   ibuprofen (ADVIL) 600 MG tablet TAKE 1 TABLET BY MOUTH EVERY 8 HOURS AS NEEDED FOR MILD OR MODERATE PAIN   traMADol (ULTRAM) 50 MG tablet Take 1 tablet (50 mg total) by mouth every 6 (six) hours as needed.   No facility-administered encounter medications on file as of 08/18/2021.    Patient Active Problem List   Diagnosis Date Noted   Pruritus 07/29/2021   Osteoarthritis 11/06/2019   Osteopenia 07/21/2017   Chronic back pain 12/31/2016   Controlled substance agreement signed 12/31/2016   Opioid dependence with opioid-induced disorder ([facility]) 12/31/2016   Overweight (BMI 25.0-29.9) 07/02/2016   GERD (gastroesophageal reflux disease) 10/01/2014   GAD (generalized anxiety disorder) 10/01/2014   Depression 10/01/2014   Menopausal hot flushes 09/16/2014   Dysmenorrhea 05/20/2014   Migraine without aura 03/27/2014    Conditions to be addressed/monitored: monitor client management of anxiety and stress issues  Care Plan : LCSW Care Plan  Updates made by Sandra Cabal, LCSW since 08/18/2021 12:00 AM     Problem: Emotional Distress      Goal: Emotional Health Supported;Manage anxiety issues faced   Start Date: 08/18/2021  Expected End Date: 11/16/2021  This Visit's Progress: On  track  Recent Progress: On track  Priority: High  Note:   Current Barriers:  Chronic Mental Health needs related to anxiety and stress issues Concerns about social isolation Suicidal Ideation/Homicidal Ideation: No Pain issues faced  Clinical Social Work Goal(s):  patient will work with SW monthly by telephone or in person to reduce or manage symptoms  related to anxiety or stress issues Patient will try to attend scheduled medical appointments in next 30 days Patient will call RNCM or LCSW as needed for support in next 30 days  Interventions: 1:1 collaboration with Sandra Balloon, FNP regarding development and update of comprehensive plan of care as evidenced by provider attestation and co-signature Talked with client about her kidney issues  Talked with client about transport needs of client.(uses [employer] bus as needed; uses [employer] van as needed) Talked with client about appetite of client  Talked with client about sleeping issues of client Talked with client about ambulation of client Talked with client about mood of client and client management of mood (she said she has been a little sad recently due to death of a close family friend) Talked with client about anxiety issues of client (client spoke of panic episodes faced; LCSW talked with Sandra Adams about managing panic episodes by taking a nap , for example, or by getting into a quiet space or quiet environment to help her calm her anxiety symptoms. She said that both of these techniques worked well in helping her manage panic attacks) Talked with client about knee issues on left knee Talked with Sandra Adams about her decreased energy Talked with client about upcoming client medical appointments Talked with client about management of health issues faced Talked with client about support from [facility] for client nursing needs Talked with client about vision of client Provided counseling support for client  Patient Self Care Activities:  Likes to walk her dog Eats meals independently Talks with family and friends via phone  Patient Coping Strengths:  Support of friends Does ADLs independently Eats meals independently  Patient Self Care Deficits:  Transport needs Pain issues  Patient Goals:  - spend time or talk with others every day - practice relaxation or meditation daily - keep a  calendar with appointment dates  Follow Up Plan:  LCSW to call client on 09/30/21 to assess client needs     Sandra Adams.Marisa Hage MSW, LCSW Licensed Clinical Social Worker [facility] Care Management [phone]

## 2021-08-18 NOTE — Patient Instructions (Signed)
Visit Information  PATIENT GOALS:  Goals Addressed             This Visit's Progress    Manage Anxiety and Stress issues faced       Timeframe:  Short-Term Goal Priority:  High Progress: On Track Start Date:     08/18/21                   Expected End Date:   11/16/21             Follow Up Date 09/30/21   Manage My Emotions (Patient) Manage anxiety and stress issues faced    Why is this important?   When you are stressed, down or upset, your body reacts too.  For example, your blood pressure may get higher; you may have a headache or stomachache.  When your emotions get the best of you, your body's ability to fight off cold and flu gets weak.  These steps will help you manage your emotions.     Patient Self Care Activities:  Likes to walk her dog Eats meals independently Talks with family and friends via phone  Patient Coping Strengths:  Support of friends Does ADLs independently Eats meals independently  Patient Self Care Deficits:  Transport needs Pain issues  Patient Goals:  - spend time or talk with others every day - practice relaxation or meditation daily - keep a calendar with appointment dates  Follow Up Plan:  LCSW to call client on 09/30/21 to assess client needs    Sandra Adams MSW, LCSW Licensed Clinical Social Worker [facility] Care Management [phone]

## 2021-08-18 NOTE — Patient Instructions (Signed)
Zyrtec every night. Flonase twice daily for next 7 days then once daily after.  Warm compresses.

## 2021-08-18 NOTE — Progress Notes (Signed)
Subjective:  Patient ID: [name], female    DOB: [birth year], 54 y.o.   MRN: [id number]  Patient Care Team: Sharion Balloon, FNP as PCP - General (Family Medicine) Jonnie Kind, MD (Inactive) as Consulting Physician (Obstetrics and Gynecology) Katha Cabal, LCSW as [place] Management (Licensed Clinical Social Worker) Ilean China, RN as Case Scientist, physiological Complaint:  Ear Pain (Right/ Went swimming yesterday)   HPI: Sandra Adams is a 55 y.o. female presenting on 08/18/2021 for Ear Pain (Right/ Went swimming yesterday)   Pt reports she woke up this morning with pain/fullness in her right ear. States she did go swimming in the pool yesterday. No fever, chills, weakness, dizziness, headaches, or fatigue.   Otalgia  There is pain in the right ear. This is a new problem. The current episode started today. The problem occurs constantly. The problem has been unchanged. There has been no fever. The pain is at a severity of 3/10. The pain is mild. Pertinent negatives include no abdominal pain, coughing, diarrhea, ear discharge, headaches, hearing loss, neck pain, rash, rhinorrhea, sore throat or vomiting. She has tried nothing for the symptoms. The treatment provided no relief.    Relevant past medical, surgical, family, and social history reviewed and updated as indicated.  Allergies and medications reviewed and updated. Data reviewed: Chart in Epic.   Past Medical History:  Diagnosis Date   Anxiety    Arthritis    Depression    Menopausal symptom    Migraine    Scoliosis     Past Surgical History:  Procedure Laterality Date   KNEE SURGERY Left 1974 and 1979    Social History   Socioeconomic History   Marital status: Divorced    Spouse name: Not on file   Number of children: 0   Years of education: 12th   Highest education level: High school graduate  Occupational History   Occupation: [employer]    Comment: Back : [place]  Knee surgery   Tobacco Use   Smoking status: Never   Smokeless tobacco: Never  Vaping Use   Vaping Use: Never used  Substance and Sexual Activity   Alcohol use: No   Drug use: No   Sexual activity: Not Currently    Partners: Male    Birth control/protection: None  Other Topics Concern   Not on file  Social History Narrative   Single,no children   Right handed   12 th grade   No caffeine   Social Determinants of Radio broadcast assistant Strain: Low Risk    Difficulty of Paying Living Expenses: Not very hard  Food Insecurity: No Food Insecurity   Worried About Charity fundraiser in the Last Year: Never true   [place] in the Last Year: Never true  Transportation Needs: No Transportation Needs   Lack of Transportation (Medical): No   Lack of Transportation (Non-Medical): No  Physical Activity: Not on file  Stress: Not on file  Social Connections: Moderately Isolated   Frequency of Communication with Friends and Family: More than three times a week   Frequency of Social Gatherings with Friends and Family: More than three times a week   Attends Religious Services: More than 4 times per year   Active Member of [employer] or Organizations: No   Attends Archivist Meetings: Never   Marital Status: Divorced  Human resources officer Violence: Not on file  Outpatient Encounter Medications as of 08/18/2021  Medication Sig   ALPRAZolam (XANAX) 0.25 MG tablet Take 1 tablet (0.25 mg total) by mouth 2 (two) times daily as needed for anxiety.   busPIRone (BUSPAR) 5 MG tablet TAKE 1 TABLET BY MOUTH 2 TIMES DAILY AS NEEDED.   cetirizine (ZYRTEC ALLERGY) 10 MG tablet Take 1 tablet (10 mg total) by mouth daily.   fluticasone (FLONASE) 50 MCG/ACT nasal spray Place 2 sprays into both nostrils daily.   hydrocortisone 1 % lotion Apply 1 application topically 2 (two) times daily.   ibuprofen (ADVIL) 600 MG tablet TAKE 1 TABLET BY MOUTH EVERY 8 HOURS AS NEEDED FOR MILD OR MODERATE  PAIN   traMADol (ULTRAM) 50 MG tablet Take 1 tablet (50 mg total) by mouth every 6 (six) hours as needed.   [DISCONTINUED] COVID-19 At-Home Test KIT Place 1 kit into both nostrils as needed.   [DISCONTINUED] doxepin (SINEQUAN) 10 MG capsule Take 1 capsule (10 mg total) by mouth at bedtime as needed.   [DISCONTINUED] venlafaxine XR (EFFEXOR-XR) 37.5 MG 24 hr capsule TAKE 1 CAPSULE BY MOUTH DAILY WITH BREAKFAST.   Facility-Administered Encounter Medications as of 08/18/2021  Medication   methylPREDNISolone acetate (DEPO-MEDROL) injection 40 mg    Allergies  Allergen Reactions   Ciprofloxacin     Makes her feel weak,dizzy   Imitrex [Sumatriptan]     Blurred vision    Prednisone Nausea And Vomiting   Tylenol [Acetaminophen]     Tylenol #3,Headaches   Vicodin [Hydrocodone-Acetaminophen]     GI upset    Review of Systems  Constitutional:  Negative for activity change, appetite change, chills, diaphoresis, fatigue, fever and unexpected weight change.  HENT:  Positive for ear pain. Negative for congestion, dental problem, drooling, ear discharge, facial swelling, hearing loss, mouth sores, nosebleeds, postnasal drip, rhinorrhea, sinus pressure, sinus pain, sneezing, sore throat, tinnitus, trouble swallowing and voice change.   Eyes: Negative.   Respiratory:  Negative for cough, chest tightness and shortness of breath.   Cardiovascular:  Negative for chest pain, palpitations and leg swelling.  Gastrointestinal:  Negative for abdominal pain, blood in stool, constipation, diarrhea, nausea and vomiting.  Endocrine: Negative.   Genitourinary:  Positive for decreased urine volume. Negative for difficulty urinating, dysuria, frequency and urgency.  Musculoskeletal:  Negative for arthralgias, myalgias and neck pain.  Skin: Negative.  Negative for rash.  Allergic/Immunologic: Negative.   Neurological:  Negative for dizziness, tremors, seizures, syncope, facial asymmetry, speech difficulty,  weakness, light-headedness, numbness and headaches.  Hematological: Negative.   Psychiatric/Behavioral:  Negative for confusion, hallucinations, sleep disturbance and suicidal ideas.   All other systems reviewed and are negative.      Objective:  BP 122/77   Pulse 73   Ht _0  (1.575 m)   Wt 140 lb (63.5 kg)   LMP 10/16/2014   SpO2 96%   BMI 25.61 kg/m    Wt Readings from Last 3 Encounters:  08/18/21 140 lb (63.5 kg)  07/29/21 140 lb (63.5 kg)  04/16/21 148 lb 9.6 oz (67.4 kg)    Physical Exam Vitals and nursing note reviewed.  Constitutional:      General: She is not in acute distress.    Appearance: Normal appearance. She is well-developed, well-groomed and normal weight. She is not ill-appearing, toxic-appearing or diaphoretic.  HENT:     Head: Normocephalic and atraumatic.     Jaw: There is normal jaw occlusion.     Right Ear: Hearing normal. No decreased hearing  noted. No laceration, drainage, swelling or tenderness. A middle ear effusion is present. There is no impacted cerumen. No foreign body. No mastoid tenderness. No PE tube. No hemotympanum. Tympanic membrane is bulging. Tympanic membrane is not injected, scarred, perforated, erythematous or retracted. Tympanic membrane has normal mobility.     Left Ear: Hearing, tympanic membrane, ear canal and external ear normal.     Nose: Nose normal.     Mouth/Throat:     Lips: Pink.     Mouth: Mucous membranes are moist.     Pharynx: Oropharynx is clear. Uvula midline.  Eyes:     General: Lids are normal.     Extraocular Movements: Extraocular movements intact.     Conjunctiva/sclera: Conjunctivae normal.     Pupils: Pupils are equal, round, and reactive to light.  Neck:     Thyroid: No thyroid mass, thyromegaly or thyroid tenderness.     Vascular: No carotid bruit or JVD.     Trachea: Trachea and phonation normal.  Cardiovascular:     Rate and Rhythm: Normal rate and regular rhythm.     Chest Wall: PMI is not  displaced.     Pulses: Normal pulses.     Heart sounds: Normal heart sounds. No murmur heard.   No friction rub. No gallop.  Pulmonary:     Effort: Pulmonary effort is normal. No respiratory distress.     Breath sounds: Normal breath sounds. No wheezing.  Abdominal:     General: Bowel sounds are normal. There is no distension or abdominal bruit.     Palpations: Abdomen is soft. There is no hepatomegaly or splenomegaly.     Tenderness: There is no abdominal tenderness. There is no right CVA tenderness or left CVA tenderness.     Hernia: No hernia is present.  Musculoskeletal:        General: Normal range of motion.     Cervical back: Normal range of motion and neck supple.     Right lower leg: No edema.     Left lower leg: No edema.  Lymphadenopathy:     Cervical: No cervical adenopathy.  Skin:    General: Skin is warm and dry.     Capillary Refill: Capillary refill takes less than 2 seconds.     Coloration: Skin is not cyanotic, jaundiced or pale.     Findings: No rash.  Neurological:     General: No focal deficit present.     Mental Status: She is alert and oriented to person, place, and time.     Cranial Nerves: Cranial nerves are intact. No cranial nerve deficit.     Sensory: Sensation is intact. No sensory deficit.     Motor: Motor function is intact. No weakness.     Coordination: Coordination is intact. Coordination normal.     Gait: Gait is intact. Gait normal.     Deep Tendon Reflexes: Reflexes are normal and symmetric. Reflexes normal.  Psychiatric:        Attention and Perception: Attention and perception normal.        Mood and Affect: Mood and affect normal.        Speech: Speech normal.        Behavior: Behavior normal. Behavior is cooperative.        Thought Content: Thought content normal.        Cognition and Memory: Cognition and memory normal.        Judgment: Judgment normal.    Results for orders placed  or performed in visit on 04/16/21  CMP14+EGFR   Result Value Ref Range   Glucose 80 65 - 99 mg/dL   BUN 8 6 - 24 mg/dL   Creatinine, Ser 0.77 0.57 - 1.00 mg/dL   eGFR 90 >59 mL/min/1.73   BUN/Creatinine Ratio 10 9 - 23   Sodium 141 134 - 144 mmol/L   Potassium 3.9 3.5 - 5.2 mmol/L   Chloride 102 96 - 106 mmol/L   CO2 25 20 - 29 mmol/L   Calcium 9.6 8.7 - 10.2 mg/dL   Total Protein 7.2 6.0 - 8.5 g/dL   Albumin 4.7 3.8 - 4.9 g/dL   Globulin, Total 2.5 1.5 - 4.5 g/dL   Albumin/Globulin Ratio 1.9 1.2 - 2.2   Bilirubin Total 0.2 0.0 - 1.2 mg/dL   Alkaline Phosphatase 134 (H) 44 - 121 IU/L   AST 28 0 - 40 IU/L   ALT 52 (H) 0 - 32 IU/L  CBC with Differential/Platelet  Result Value Ref Range   WBC 10.0 3.4 - 10.8 x10E3/uL   RBC 4.56 3.77 - 5.28 x10E6/uL   Hemoglobin 14.6 11.1 - 15.9 g/dL   Hematocrit 43.4 34.0 - 46.6 %   MCV 95 79 - 97 fL   MCH 32.0 26.6 - 33.0 pg   MCHC 33.6 31.5 - 35.7 g/dL   RDW 12.6 11.7 - 15.4 %   Platelets 243 150 - 450 x10E3/uL   Neutrophils 52 Not Estab. %   Lymphs 38 Not Estab. %   Monocytes 7 Not Estab. %   Eos 2 Not Estab. %   Basos 1 Not Estab. %   Neutrophils Absolute 5.2 1.4 - 7.0 x10E3/uL   Lymphocytes Absolute 3.8 (H) 0.7 - 3.1 x10E3/uL   Monocytes Absolute 0.7 0.1 - 0.9 x10E3/uL   EOS (ABSOLUTE) 0.2 0.0 - 0.4 x10E3/uL   Basophils Absolute 0.1 0.0 - 0.2 x10E3/uL   Immature Granulocytes 0 Not Estab. %   Immature Grans (Abs) 0.0 0.0 - 0.1 x10E3/uL  Lipid panel  Result Value Ref Range   Cholesterol, Total 209 (H) 100 - 199 mg/dL   Triglycerides 175 (H) 0 - 149 mg/dL   HDL 53 >39 mg/dL   VLDL Cholesterol Cal 31 5 - 40 mg/dL   LDL Chol Calc (NIH) 125 (H) 0 - 99 mg/dL   Chol/HDL Ratio 3.9 0.0 - 4.4 ratio  TSH  Result Value Ref Range   TSH 2.270 0.450 - 4.500 uIU/mL  VITAMIN D 25 Hydroxy (Vit-D Deficiency, Fractures)  Result Value Ref Range   Vit D, 25-Hydroxy 23.2 (L) 30.0 - 100.0 ng/mL  Cytology - PAP([place])  Result Value Ref Range   Adequacy      Satisfactory for evaluation;  transformation zone component ABSENT.   Diagnosis      - Negative for intraepithelial lesion or malignancy (NILM)       Pertinent labs & imaging results that were available during my care of the patient were reviewed by me and considered in my medical decision making.  Assessment & Plan:  Kadeisha was seen today for ear pain.  Diagnoses and all orders for this visit:  Non-recurrent acute serous otitis media of right ear Right ear effusion. No erythema, fever, or other indications of acute bacterial infection. External canal normal. Symptomatic care discussed in detail. Report any new, worsening, or persistent symptoms. Flonase, Zyrtec, and motrin as discussed.  -     methylPREDNISolone acetate (DEPO-MEDROL) injection 40 mg     Continue all  other maintenance medications.  Follow up plan: Return if symptoms worsen or fail to improve.   Continue healthy lifestyle choices, including diet (rich in fruits, vegetables, and lean proteins, and low in salt and simple carbohydrates) and exercise (at least 30 minutes of moderate physical activity daily).  Educational handout given for otitis media with effusion  The above assessment and management plan was discussed with the patient. The patient verbalized understanding of and has agreed to the management plan. Patient is aware to call the clinic if they develop any new symptoms or if symptoms persist or worsen. Patient is aware when to return to the clinic for a follow-up visit. Patient educated on when it is appropriate to go to the emergency department.   Monia Pouch, FNP-C [facility] [phone]

## 2021-08-19 DIAGNOSIS — H6980 Other specified disorders of Eustachian tube, unspecified ear: Secondary | ICD-10-CM | POA: Diagnosis not present

## 2021-08-23 DIAGNOSIS — M25511 Pain in right shoulder: Secondary | ICD-10-CM | POA: Diagnosis not present

## 2021-08-23 DIAGNOSIS — S40021A Contusion of right upper arm, initial encounter: Secondary | ICD-10-CM | POA: Diagnosis not present

## 2021-08-25 ENCOUNTER — Telehealth: Payer: Medicare Other | Admitting: *Deleted

## 2021-08-26 ENCOUNTER — Other Ambulatory Visit: Payer: Self-pay | Admitting: Family

## 2021-08-26 DIAGNOSIS — F331 Major depressive disorder, recurrent, moderate: Secondary | ICD-10-CM

## 2021-08-26 DIAGNOSIS — F411 Generalized anxiety disorder: Secondary | ICD-10-CM

## 2021-08-27 ENCOUNTER — Ambulatory Visit (INDEPENDENT_AMBULATORY_CARE_PROVIDER_SITE_OTHER): Payer: Medicare Other | Admitting: Family Medicine

## 2021-08-27 ENCOUNTER — Encounter: Payer: Self-pay | Admitting: Family Medicine

## 2021-08-27 DIAGNOSIS — N898 Other specified noninflammatory disorders of vagina: Secondary | ICD-10-CM | POA: Diagnosis not present

## 2021-08-27 DIAGNOSIS — R300 Dysuria: Secondary | ICD-10-CM | POA: Diagnosis not present

## 2021-08-27 MED ORDER — FLUCONAZOLE 150 MG PO TABS: ORAL_TABLET | ORAL | 0 refills | Status: DC

## 2021-08-27 NOTE — Progress Notes (Signed)
   Virtual Visit  Note Due to COVID-19 pandemic this visit was conducted virtually. This visit type was conducted due to national recommendations for restrictions regarding the COVID-19 Pandemic (e.g. social distancing, sheltering in place) in an effort to limit this patient's exposure and mitigate transmission in our community. All issues noted in this document were discussed and addressed.  A physical exam was not performed with this format.  I connected with Sandra Adams on 08/27/21 at 1632 by telephone and verified that I am speaking with the correct person using two identifiers. Sandra Adams is currently located at home and no one is currently with her during the visit. The provider, Gwenlyn Perking, FNP is located in their office at time of visit.  I discussed the limitations, risks, security and privacy concerns of performing an evaluation and management service by telephone and the availability of in person appointments. I also discussed with the patient that there may be a patient responsible charge related to this service. The patient expressed understanding and agreed to proceed.  CC: vaginal itching  History and Present Illness:  HPI Sandra Adams reports vaginal itching x 2 days. She also reports urinary odor and dysuria. She denies fever, flank pain, abdominal pain, urgency, frequency, nausea, vomiting, or vaginal discharge. She reports that history of yeast infections that present in this way. She denies history of UTI. She would not be able to get to the office in time today to leave a urine specimen for testing.     ROS As per HPI.  Observations/Objective: Alert and oriented x 3. Able to speak in full sentences without difficulty.    Assessment and Plan: [name] was seen today for vaginal itching.  Diagnoses and all orders for this visit:  Vaginal itching Diflucan as below.  -     fluconazole (DIFLUCAN) 150 MG tablet; Take one tablet by mouth now. May repeat in 3  days if symptoms persist.  Dysuria Discussed that symptoms could be due to yeast and/or bacteria. She is not able to get to office in time to leave urine for UA. She declined empiric antibiotics. There is not a previous history of UTIs. Discussed return precautions.    Follow Up Instructions: Return to office for new or worsening symptoms, or if symptoms persist.     I discussed the assessment and treatment plan with the patient. The patient was provided an opportunity to ask questions and all were answered. The patient agreed with the plan and demonstrated an understanding of the instructions.   The patient was advised to call back or seek an in-person evaluation if the symptoms worsen or if the condition fails to improve as anticipated.  The above assessment and management plan was discussed with the patient. The patient verbalized understanding of and has agreed to the management plan. Patient is aware to call the clinic if symptoms persist or worsen. Patient is aware when to return to the clinic for a follow-up visit. Patient educated on when it is appropriate to go to the emergency department.   Time call ended: 1645   I provided 13 minutes of  non face-to-face time during this encounter.    Gwenlyn Perking, FNP

## 2021-09-01 ENCOUNTER — Ambulatory Visit (INDEPENDENT_AMBULATORY_CARE_PROVIDER_SITE_OTHER): Payer: Medicare Other | Admitting: Family Medicine

## 2021-09-01 ENCOUNTER — Other Ambulatory Visit: Payer: Self-pay

## 2021-09-01 ENCOUNTER — Encounter: Payer: Self-pay | Admitting: Family Medicine

## 2021-09-01 VITALS — BP 119/83 | HR 72 | Temp 97.5°F | Ht 62.0 in | Wt 139.0 lb

## 2021-09-01 DIAGNOSIS — N898 Other specified noninflammatory disorders of vagina: Secondary | ICD-10-CM

## 2021-09-01 DIAGNOSIS — J301 Allergic rhinitis due to pollen: Secondary | ICD-10-CM

## 2021-09-01 DIAGNOSIS — N390 Urinary tract infection, site not specified: Secondary | ICD-10-CM

## 2021-09-01 DIAGNOSIS — B9620 Unspecified Escherichia coli [E. coli] as the cause of diseases classified elsewhere: Secondary | ICD-10-CM

## 2021-09-01 DIAGNOSIS — R300 Dysuria: Secondary | ICD-10-CM

## 2021-09-01 LAB — WET PREP FOR TRICH, YEAST, CLUE
Clue Cell Exam: NEGATIVE
Trichomonas Exam: NEGATIVE
Yeast Exam: NEGATIVE

## 2021-09-01 LAB — URINALYSIS, COMPLETE
Bilirubin, UA: NEGATIVE
Glucose, UA: NEGATIVE
Ketones, UA: NEGATIVE
Nitrite, UA: NEGATIVE
Protein,UA: NEGATIVE
Specific Gravity, UA: 1.025 (ref 1.005–1.030)
Urobilinogen, Ur: 0.2 mg/dL (ref 0.2–1.0)
pH, UA: 5.5 (ref 5.0–7.5)

## 2021-09-01 LAB — MICROSCOPIC EXAMINATION: Renal Epithel, UA: NONE SEEN /hpf

## 2021-09-01 MED ORDER — FLUTICASONE PROPIONATE 50 MCG/ACT NA SUSP: 2.0000 | Freq: Every day | NASAL | 6 refills | Status: DC

## 2021-09-01 NOTE — Progress Notes (Signed)
Subjective:  Patient ID: Sandra Adams, female    DOB: 1966-11-16, 55 y.o.   MRN: [id number]  Patient Care Team: Sharion Balloon, FNP as PCP - General (Family Medicine) Jonnie Kind, MD (Inactive) as Consulting Physician (Obstetrics and Gynecology) Katha Cabal, LCSW as [facility] (Licensed Clinical Social Worker) Ilean China, RN as Case Scientist, physiological Complaint:  Vaginal Itching (Possible yeast infection, right ear tingling, right eye discomfort)   HPI: Sandra Adams is a 55 y.o. female presenting on 09/01/2021 for Vaginal Itching (Possible yeast infection, right ear tingling, right eye discomfort)   Pt presents with complaints of dysuria and malodorous urine for 2 days and vaginal pruritis without discharge. She also complains of rhinorrhea and sneezing for several days. States she uses Flonase with great relief of these symptoms but is almost out and would like a refill.   Vaginal Itching The patient's primary symptoms include genital itching. The patient's pertinent negatives include no pelvic pain or vaginal discharge. This is a recurrent problem. The current episode started yesterday. The problem occurs intermittently. The problem has been waxing and waning. The patient is experiencing no pain. She is not pregnant. Associated symptoms include dysuria. Pertinent negatives include no abdominal pain, anorexia, back pain, chills, constipation, diarrhea, discolored urine, fever, flank pain, frequency, headaches, hematuria, joint pain, joint swelling, nausea, painful intercourse, rash, sore throat, urgency or vomiting. The symptoms are aggravated by urinating. She has tried nothing for the symptoms. The treatment provided no relief. She is not sexually active.  Dysuria  This is a recurrent problem. The current episode started yesterday. The problem occurs intermittently. The problem has been waxing and waning. The quality of the pain is  described as aching and burning. The pain is at a severity of 2/10. The pain is mild. There has been no fever. She is Not sexually active. There is No history of pyelonephritis. Pertinent negatives include no chills, discharge, flank pain, frequency, hematuria, hesitancy, nausea, possible pregnancy, urgency or vomiting. She has tried nothing for the symptoms. The treatment provided no relief.    Relevant past medical, surgical, family, and social history reviewed and updated as indicated.  Allergies and medications reviewed and updated. Data reviewed: Chart in Epic.   Past Medical History:  Diagnosis Date   Anxiety    Arthritis    Depression    Menopausal symptom    Migraine    Scoliosis     Past Surgical History:  Procedure Laterality Date   KNEE SURGERY Left 1974 and 1979    Social History   Socioeconomic History   Marital status: Divorced    Spouse name: Not on file   Number of children: 0   Years of education: 12th   Highest education level: High school graduate  Occupational History   Occupation: [employer]    Comment: Back : [place] Knee surgery   Tobacco Use   Smoking status: Never   Smokeless tobacco: Never  Vaping Use   Vaping Use: Never used  Substance and Sexual Activity   Alcohol use: No   Drug use: No   Sexual activity: Not Currently    Partners: Male    Birth control/protection: None  Other Topics Concern   Not on file  Social History Narrative   Single,no children   Right handed   12 th grade   No caffeine   Social Determinants of Health   Financial Resource Strain: Low Risk  Difficulty of Paying Living Expenses: Not very hard  Food Insecurity: No Food Insecurity   Worried About Running Out of Food in the Last Year: Never true   Ran Out of Food in the Last Year: Never true  Transportation Needs: No Transportation Needs   Lack of Transportation (Medical): No   Lack of Transportation (Non-Medical): No  Physical Activity: Not on file   Stress: Not on file  Social Connections: Moderately Isolated   Frequency of Communication with Friends and Family: Twice a week   Frequency of Social Gatherings with Friends and Family: Twice a week   Attends Religious Services: More than 4 times per year   Active Member of [employer] or Organizations: No   Attends Archivist Meetings: Never   Marital Status: Divorced  Human resources officer Violence: Not on file    Outpatient Encounter Medications as of 09/01/2021  Medication Sig   ALPRAZolam (XANAX) 0.25 MG tablet Take 1 tablet (0.25 mg total) by mouth 2 (two) times daily as needed for anxiety.   cetirizine (ZYRTEC ALLERGY) 10 MG tablet Take 1 tablet (10 mg total) by mouth daily.   fluconazole (DIFLUCAN) 150 MG tablet Take one tablet by mouth now. May repeat in 3 days if symptoms persist.   hydrocortisone 1 % lotion Apply 1 application topically 2 (two) times daily.   ibuprofen (ADVIL) 600 MG tablet TAKE 1 TABLET BY MOUTH EVERY 8 HOURS AS NEEDED FOR MILD OR MODERATE PAIN   traMADol (ULTRAM) 50 MG tablet Take 1 tablet (50 mg total) by mouth every 6 (six) hours as needed.   [DISCONTINUED] fluticasone (FLONASE) 50 MCG/ACT nasal spray Place 2 sprays into both nostrils daily.   busPIRone (BUSPAR) 5 MG tablet TAKE 1 TABLET BY MOUTH TWICE A DAY AS NEEDED (Patient not taking: Reported on 09/01/2021)   fluticasone (FLONASE) 50 MCG/ACT nasal spray Place 2 sprays into both nostrils daily.   No facility-administered encounter medications on file as of 09/01/2021.    Allergies  Allergen Reactions   Ciprofloxacin     Makes her feel weak,dizzy   Imitrex [Sumatriptan]     Blurred vision    Prednisone Nausea And Vomiting   Tylenol [Acetaminophen]     Tylenol #3,Headaches   Vicodin [Hydrocodone-Acetaminophen]     GI upset    Review of Systems  Constitutional:  Negative for activity change, appetite change, chills, diaphoresis, fatigue, fever and unexpected weight change.  HENT:  Positive for  rhinorrhea and sneezing. Negative for congestion, dental problem, drooling, ear discharge, ear pain, facial swelling, hearing loss, mouth sores, nosebleeds, postnasal drip, sinus pressure, sinus pain, sore throat, tinnitus, trouble swallowing and voice change.   Eyes: Negative.   Respiratory:  Negative for cough, chest tightness and shortness of breath.   Cardiovascular:  Negative for chest pain, palpitations and leg swelling.  Gastrointestinal:  Negative for abdominal pain, anorexia, blood in stool, constipation, diarrhea, nausea and vomiting.  Endocrine: Negative.   Genitourinary:  Positive for dysuria and vaginal pain (pruritis). Negative for decreased urine volume, difficulty urinating, dyspareunia, enuresis, flank pain, frequency, genital sores, hematuria, hesitancy, menstrual problem, pelvic pain, urgency, vaginal bleeding and vaginal discharge.  Musculoskeletal:  Negative for arthralgias, back pain, joint pain and myalgias.  Skin: Negative.  Negative for rash.  Allergic/Immunologic: Negative.  Negative for food allergies.  Neurological:  Negative for dizziness, seizures and headaches.  Hematological: Negative.   Psychiatric/Behavioral:  Negative for confusion, hallucinations, sleep disturbance and suicidal ideas.   All other systems reviewed and are  negative.      Objective:  BP 119/83   Pulse 72   Temp (!) 97.5 F (36.4 C)   Ht 5' 2" (1.575 m)   Wt 139 lb (63 kg)   LMP 10/16/2014   SpO2 96%   BMI 25.42 kg/m    Wt Readings from Last 3 Encounters:  09/01/21 139 lb (63 kg)  08/18/21 140 lb (63.5 kg)  07/29/21 140 lb (63.5 kg)    Physical Exam Vitals and nursing note reviewed.  Constitutional:      General: She is not in acute distress.    Appearance: Normal appearance. She is well-developed, well-groomed and normal weight. She is not ill-appearing, toxic-appearing or diaphoretic.  HENT:     Head: Normocephalic and atraumatic.     Jaw: There is normal jaw occlusion.      Right Ear: Hearing normal. A middle ear effusion is present. Tympanic membrane is not erythematous, retracted or bulging.     Left Ear: Hearing normal. A middle ear effusion is present. Tympanic membrane is not erythematous, retracted or bulging.     Nose: Rhinorrhea present. Rhinorrhea is clear.     Right Turbinates: Enlarged and pale.     Left Turbinates: Enlarged and pale.     Mouth/Throat:     Lips: Pink.     Mouth: Mucous membranes are moist.     Tongue: No lesions.     Palate: No mass and lesions.     Pharynx: Oropharynx is clear. Uvula midline.     Tonsils: No tonsillar exudate or tonsillar abscesses.  Eyes:     General: Lids are normal.     Extraocular Movements: Extraocular movements intact.     Conjunctiva/sclera: Conjunctivae normal.     Pupils: Pupils are equal, round, and reactive to light.  Neck:     Thyroid: No thyroid mass, thyromegaly or thyroid tenderness.     Vascular: No carotid bruit or JVD.     Trachea: Trachea and phonation normal.  Cardiovascular:     Rate and Rhythm: Normal rate and regular rhythm.     Chest Wall: PMI is not displaced.     Pulses: Normal pulses.     Heart sounds: Normal heart sounds. No murmur heard.   No friction rub. No gallop.  Pulmonary:     Effort: Pulmonary effort is normal. No respiratory distress.     Breath sounds: Normal breath sounds. No wheezing.  Abdominal:     General: Bowel sounds are normal. There is no distension or abdominal bruit.     Palpations: Abdomen is soft. There is no hepatomegaly, splenomegaly or mass.     Tenderness: There is no abdominal tenderness. There is no right CVA tenderness, left CVA tenderness, guarding or rebound.     Hernia: No hernia is present.  Musculoskeletal:        General: Normal range of motion.     Cervical back: Normal range of motion and neck supple.     Right lower leg: No edema.     Left lower leg: No edema.  Lymphadenopathy:     Cervical: No cervical adenopathy.  Skin:     General: Skin is warm and dry.     Capillary Refill: Capillary refill takes less than 2 seconds.     Coloration: Skin is not cyanotic, jaundiced or pale.     Findings: No rash.  Neurological:     General: No focal deficit present.     Mental Status: She is alert  and oriented to person, place, and time.     Cranial Nerves: Cranial nerves are intact. No cranial nerve deficit.     Sensory: Sensation is intact. No sensory deficit.     Motor: Motor function is intact. No weakness.     Coordination: Coordination is intact. Coordination normal.     Gait: Gait is intact. Gait normal.     Deep Tendon Reflexes: Reflexes are normal and symmetric. Reflexes normal.  Psychiatric:        Attention and Perception: Attention and perception normal.        Mood and Affect: Mood and affect normal.        Speech: Speech normal.        Behavior: Behavior normal. Behavior is cooperative.        Thought Content: Thought content normal.        Cognition and Memory: Cognition and memory normal.        Judgment: Judgment normal.    Results for orders placed or performed in visit on 04/16/21  CMP14+EGFR  Result Value Ref Range   Glucose 80 65 - 99 mg/dL   BUN 8 6 - 24 mg/dL   Creatinine, Ser 0.77 0.57 - 1.00 mg/dL   eGFR 90 >59 mL/min/1.73   BUN/Creatinine Ratio 10 9 - 23   Sodium 141 134 - 144 mmol/L   Potassium 3.9 3.5 - 5.2 mmol/L   Chloride 102 96 - 106 mmol/L   CO2 25 20 - 29 mmol/L   Calcium 9.6 8.7 - 10.2 mg/dL   Total Protein 7.2 6.0 - 8.5 g/dL   Albumin 4.7 3.8 - 4.9 g/dL   Globulin, Total 2.5 1.5 - 4.5 g/dL   Albumin/Globulin Ratio 1.9 1.2 - 2.2   Bilirubin Total 0.2 0.0 - 1.2 mg/dL   Alkaline Phosphatase 134 (H) 44 - 121 IU/L   AST 28 0 - 40 IU/L   ALT 52 (H) 0 - 32 IU/L  CBC with Differential/Platelet  Result Value Ref Range   WBC 10.0 3.4 - 10.8 x10E3/uL   RBC 4.56 3.77 - 5.28 x10E6/uL   Hemoglobin 14.6 11.1 - 15.9 g/dL   Hematocrit 43.4 34.0 - 46.6 %   MCV 95 79 - 97 fL   MCH  32.0 26.6 - 33.0 pg   MCHC 33.6 31.5 - 35.7 g/dL   RDW 12.6 11.7 - 15.4 %   Platelets 243 150 - 450 x10E3/uL   Neutrophils 52 Not Estab. %   Lymphs 38 Not Estab. %   Monocytes 7 Not Estab. %   Eos 2 Not Estab. %   Basos 1 Not Estab. %   Neutrophils Absolute 5.2 1.4 - 7.0 x10E3/uL   Lymphocytes Absolute 3.8 (H) 0.7 - 3.1 x10E3/uL   Monocytes Absolute 0.7 0.1 - 0.9 x10E3/uL   EOS (ABSOLUTE) 0.2 0.0 - 0.4 x10E3/uL   Basophils Absolute 0.1 0.0 - 0.2 x10E3/uL   Immature Granulocytes 0 Not Estab. %   Immature Grans (Abs) 0.0 0.0 - 0.1 x10E3/uL  Lipid panel  Result Value Ref Range   Cholesterol, Total 209 (H) 100 - 199 mg/dL   Triglycerides 175 (H) 0 - 149 mg/dL   HDL 53 >39 mg/dL   VLDL Cholesterol Cal 31 5 - 40 mg/dL   LDL Chol Calc (NIH) 125 (H) 0 - 99 mg/dL   Chol/HDL Ratio 3.9 0.0 - 4.4 ratio  TSH  Result Value Ref Range   TSH 2.270 0.450 - 4.500 uIU/mL  VITAMIN D 25 Hydroxy (  Vit-D Deficiency, Fractures)  Result Value Ref Range   Vit D, 25-Hydroxy 23.2 (L) 30.0 - 100.0 ng/mL  Cytology - PAP([facility])  Result Value Ref Range   Adequacy      Satisfactory for evaluation; transformation zone component ABSENT.   Diagnosis      - Negative for intraepithelial lesion or malignancy (NILM)       Pertinent labs & imaging results that were available during my care of the patient were reviewed by me and considered in my medical decision making.  Assessment & Plan:  Penny was seen today for vaginal itching.  Diagnoses and all orders for this visit:  Dysuria Urinalysis and culture pending, will treat if warranted. Increase water intake and avoid bladder irritants.  -     Urine Culture -     Urinalysis, Complete  Vaginal itching Wet prep pending. Will treat if warranted. Vaginal hygiene discussed in detail.  -     WET PREP FOR [place], YEAST, CLUE  Seasonal allergic rhinitis due to pollen Symptomatic care discussed in detail. Will refill Flonase.  -     fluticasone (FLONASE)  50 MCG/ACT nasal spray; Place 2 sprays into both nostrils daily.     Continue all other maintenance medications.  Follow up plan: Return if symptoms worsen or fail to improve.   Continue healthy lifestyle choices, including diet (rich in fruits, vegetables, and lean proteins, and low in salt and simple carbohydrates) and exercise (at least 30 minutes of moderate physical activity daily).  Educational handout given for seasonal allergies  The above assessment and management plan was discussed with the patient. The patient verbalized understanding of and has agreed to the management plan. Patient is aware to call the clinic if they develop any new symptoms or if symptoms persist or worsen. Patient is aware when to return to the clinic for a follow-up visit. Patient educated on when it is appropriate to go to the emergency department.   Monia Pouch, FNP-C [facility] [phone]

## 2021-09-06 LAB — URINE CULTURE

## 2021-09-07 ENCOUNTER — Ambulatory Visit: Payer: Medicare Other | Admitting: Nurse Practitioner

## 2021-09-07 MED ORDER — AMOXICILLIN-POT CLAVULANATE 875-125 MG PO TABS: 1.0000 | ORAL_TABLET | Freq: Two times a day (BID) | ORAL | 0 refills | Status: AC

## 2021-09-07 NOTE — Addendum Note (Signed)
Addended by: Baruch Gouty on: 09/07/2021 10:00 AM   Modules accepted: Orders

## 2021-09-17 ENCOUNTER — Other Ambulatory Visit: Payer: Self-pay | Admitting: Family

## 2021-09-17 ENCOUNTER — Ambulatory Visit (INDEPENDENT_AMBULATORY_CARE_PROVIDER_SITE_OTHER): Payer: Medicare Other | Admitting: Family

## 2021-09-17 DIAGNOSIS — F331 Major depressive disorder, recurrent, moderate: Secondary | ICD-10-CM

## 2021-09-17 DIAGNOSIS — Z91199 Patient's noncompliance with other medical treatment and regimen due to unspecified reason: Secondary | ICD-10-CM

## 2021-09-17 DIAGNOSIS — F411 Generalized anxiety disorder: Secondary | ICD-10-CM

## 2021-09-17 NOTE — Telephone Encounter (Signed)
APPT MADE

## 2021-09-17 NOTE — Telephone Encounter (Signed)
  Prescription Request  09/17/2021  Is this a "Controlled Substance" medicine? no Have you seen your PCP in the last 2 weeks? yes If YES, route message to pool  -  If NO, patient needs to be scheduled for appointment.  What is the name of the medication or equipment?pt is on antibiotics and now has yeast infection  Have you contacted your pharmacy to request a refill?   Which [facility] you [facility]   Patient notified that their request is being sent to the clinical staff for review and that they should receive a response within 2 business days.

## 2021-09-17 NOTE — Telephone Encounter (Signed)
Any advise or instructions for the yeast infection or does she NTBS

## 2021-09-17 NOTE — Telephone Encounter (Signed)
pt is on antibiotics and now has yeast infection, Please Advise  Venlafaxine not on current med list, Next OV 04/19/22

## 2021-09-17 NOTE — Progress Notes (Signed)
Attempted to contact patient. Unable to reach patient.   [name], FNP

## 2021-09-30 ENCOUNTER — Ambulatory Visit (INDEPENDENT_AMBULATORY_CARE_PROVIDER_SITE_OTHER): Payer: Medicare Other | Admitting: Licensed Clinical Social Worker

## 2021-09-30 DIAGNOSIS — G43009 Migraine without aura, not intractable, without status migrainosus: Secondary | ICD-10-CM

## 2021-09-30 DIAGNOSIS — K219 Gastro-esophageal reflux disease without esophagitis: Secondary | ICD-10-CM

## 2021-09-30 DIAGNOSIS — M8589 Other specified disorders of bone density and structure, multiple sites: Secondary | ICD-10-CM

## 2021-09-30 DIAGNOSIS — M159 Polyosteoarthritis, unspecified: Secondary | ICD-10-CM

## 2021-09-30 DIAGNOSIS — F331 Major depressive disorder, recurrent, moderate: Secondary | ICD-10-CM

## 2021-09-30 DIAGNOSIS — F411 Generalized anxiety disorder: Secondary | ICD-10-CM

## 2021-09-30 NOTE — Patient Instructions (Signed)
Visit Information  PATIENT GOALS:  Goals Addressed             This Visit's Progress    Manage Anxiety and Stress issues faced. Manage pain issues       Timeframe:  Short-Term Goal Priority:  Medium Progress: On Track Start Date:     09/30/21                   Expected End Date:   12/25/21             Follow Up Date 11/20/21 at 3:00 PM   Manage My Emotions (Patient) Manage anxiety and stress issues faced . Manage pain issues   Why is this important?   When you are stressed, down or upset, your body reacts too.  For example, your blood pressure may get higher; you may have a headache or stomachache.  When your emotions get the best of you, your body's ability to fight off cold and flu gets weak.  These steps will help you manage your emotions.     Patient Self Care Activities:  Likes to walk her dog Eats meals independently Talks with family and friends via phone  Patient Coping Strengths:  Support of friends Does ADLs independently Eats meals independently  Patient Self Care Deficits:  Transport needs Pain issues  Patient Goals:  - spend time or talk with others every day - practice relaxation or meditation daily - keep a calendar with appointment dates  Follow Up Plan:  LCSW to call client on 11/20/21 at 3:00 PM to assess client needs   Norva Riffle.Boykin Baetz MSW, LCSW Licensed Clinical Social Worker [facility] Care Management [phone]
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
## 2021-10-01 ENCOUNTER — Ambulatory Visit (INDEPENDENT_AMBULATORY_CARE_PROVIDER_SITE_OTHER): Payer: Medicare Other | Admitting: Family

## 2021-10-01 DIAGNOSIS — Z91199 Patient's noncompliance with other medical treatment and regimen due to unspecified reason: Secondary | ICD-10-CM

## 2021-10-01 NOTE — Progress Notes (Signed)
    Attempted to call patient. No answer. Will close visit.    [name], FNP

## 2021-10-05 ENCOUNTER — Other Ambulatory Visit: Payer: Self-pay | Admitting: Family

## 2021-10-08 ENCOUNTER — Telehealth: Payer: Self-pay | Admitting: Family

## 2021-10-08 NOTE — Telephone Encounter (Signed)
Tried calling patient schedule Medicare Annual Wellness Visit (AWV) either virtually or in office.  No answer   Last AWV ;10/16/2019  please schedule at anytime with health coach  This should be a 45 minute visit.

## 2021-10-19 DIAGNOSIS — F331 Major depressive disorder, recurrent, moderate: Secondary | ICD-10-CM

## 2021-10-19 DIAGNOSIS — M159 Polyosteoarthritis, unspecified: Secondary | ICD-10-CM | POA: Diagnosis not present

## 2021-10-20 ENCOUNTER — Ambulatory Visit: Payer: Medicare Other | Admitting: Family

## 2021-10-23 ENCOUNTER — Ambulatory Visit: Payer: Medicare Other | Admitting: Family

## 2021-11-07 ENCOUNTER — Other Ambulatory Visit: Payer: Self-pay | Admitting: Family

## 2021-11-07 DIAGNOSIS — F411 Generalized anxiety disorder: Secondary | ICD-10-CM

## 2021-11-07 DIAGNOSIS — E663 Overweight: Secondary | ICD-10-CM

## 2021-11-07 DIAGNOSIS — Z79899 Other long term (current) drug therapy: Secondary | ICD-10-CM

## 2021-11-20 ENCOUNTER — Encounter: Payer: Self-pay | Admitting: Family

## 2021-11-20 ENCOUNTER — Telehealth: Payer: Medicare Other

## 2021-11-20 ENCOUNTER — Ambulatory Visit (INDEPENDENT_AMBULATORY_CARE_PROVIDER_SITE_OTHER): Payer: Medicare Other | Admitting: Family

## 2021-11-20 VITALS — BP 121/78 | HR 73 | Temp 97.4°F | Ht 62.0 in | Wt 141.0 lb

## 2021-11-20 DIAGNOSIS — Z1211 Encounter for screening for malignant neoplasm of colon: Secondary | ICD-10-CM

## 2021-11-20 DIAGNOSIS — F411 Generalized anxiety disorder: Secondary | ICD-10-CM

## 2021-11-20 DIAGNOSIS — R300 Dysuria: Secondary | ICD-10-CM | POA: Diagnosis not present

## 2021-11-20 DIAGNOSIS — M159 Polyosteoarthritis, unspecified: Secondary | ICD-10-CM

## 2021-11-20 DIAGNOSIS — F331 Major depressive disorder, recurrent, moderate: Secondary | ICD-10-CM

## 2021-11-20 DIAGNOSIS — Z79899 Other long term (current) drug therapy: Secondary | ICD-10-CM | POA: Diagnosis not present

## 2021-11-20 DIAGNOSIS — E663 Overweight: Secondary | ICD-10-CM

## 2021-11-20 DIAGNOSIS — K219 Gastro-esophageal reflux disease without esophagitis: Secondary | ICD-10-CM | POA: Diagnosis not present

## 2021-11-20 DIAGNOSIS — J301 Allergic rhinitis due to pollen: Secondary | ICD-10-CM

## 2021-11-20 DIAGNOSIS — Z780 Asymptomatic menopausal state: Secondary | ICD-10-CM | POA: Diagnosis not present

## 2021-11-20 DIAGNOSIS — N898 Other specified noninflammatory disorders of vagina: Secondary | ICD-10-CM

## 2021-11-20 LAB — CMP14+EGFR
ALT: 26 IU/L (ref 0–32)
AST: 23 IU/L (ref 0–40)
Albumin/Globulin Ratio: 1.9 (ref 1.2–2.2)
Albumin: 4.5 g/dL (ref 3.8–4.9)
Alkaline Phosphatase: 125 IU/L — ABNORMAL HIGH (ref 44–121)
BUN/Creatinine Ratio: 12 (ref 9–23)
BUN: 10 mg/dL (ref 6–24)
Bilirubin Total: 0.5 mg/dL (ref 0.0–1.2)
CO2: 25 mmol/L (ref 20–29)
Calcium: 9.7 mg/dL (ref 8.7–10.2)
Chloride: 104 mmol/L (ref 96–106)
Creatinine, Ser: 0.81 mg/dL (ref 0.57–1.00)
Globulin, Total: 2.4 g/dL (ref 1.5–4.5)
Glucose: 117 mg/dL — ABNORMAL HIGH (ref 70–99)
Potassium: 4.1 mmol/L (ref 3.5–5.2)
Sodium: 143 mmol/L (ref 134–144)
Total Protein: 6.9 g/dL (ref 6.0–8.5)
eGFR: 85 mL/min/{1.73_m2} (ref >59)

## 2021-11-20 LAB — CBC WITH DIFFERENTIAL/PLATELET
Basophils Absolute: 0.1 10*3/uL (ref 0.0–0.2)
Basos: 1 %
EOS (ABSOLUTE): 0.3 10*3/uL (ref 0.0–0.4)
Eos: 3 %
Hematocrit: 42.2 % (ref 34.0–46.6)
Hemoglobin: 14.2 g/dL (ref 11.1–15.9)
Immature Grans (Abs): 0 10*3/uL (ref 0.0–0.1)
Immature Granulocytes: 0 %
Lymphocytes Absolute: 4.1 10*3/uL — ABNORMAL HIGH (ref 0.7–3.1)
Lymphs: 44 %
MCH: 32.4 pg (ref 26.6–33.0)
MCHC: 33.6 g/dL (ref 31.5–35.7)
MCV: 96 fL (ref 79–97)
Monocytes Absolute: 0.7 10*3/uL (ref 0.1–0.9)
Monocytes: 7 %
Neutrophils Absolute: 4.1 10*3/uL (ref 1.4–7.0)
Neutrophils: 45 %
Platelets: 220 10*3/uL (ref 150–450)
RBC: 4.38 x10E6/uL (ref 3.77–5.28)
RDW: 12.3 % (ref 11.7–15.4)
WBC: 9.2 10*3/uL (ref 3.4–10.8)

## 2021-11-20 LAB — MICROSCOPIC EXAMINATION: Renal Epithel, UA: NONE SEEN /hpf

## 2021-11-20 LAB — URINALYSIS, COMPLETE
Bilirubin, UA: NEGATIVE
Glucose, UA: NEGATIVE
Ketones, UA: NEGATIVE
Nitrite, UA: NEGATIVE
Specific Gravity, UA: >1.03 — ABNORMAL HIGH (ref 1.005–1.030)
Urobilinogen, Ur: 0.2 mg/dL (ref 0.2–1.0)
pH, UA: 5.5 (ref 5.0–7.5)

## 2021-11-20 MED ORDER — OMEPRAZOLE 20 MG PO CPDR: 20.0000 mg | DELAYED_RELEASE_CAPSULE | Freq: Every day | ORAL | 1 refills | Status: DC

## 2021-11-20 MED ORDER — ALPRAZOLAM 0.25 MG PO TABS: 0.2500 mg | ORAL_TABLET | Freq: Two times a day (BID) | ORAL | 2 refills | Status: DC | PRN

## 2021-11-20 MED ORDER — FLUTICASONE PROPIONATE 50 MCG/ACT NA SUSP: 2.0000 | Freq: Every day | NASAL | 6 refills | Status: DC

## 2021-11-20 NOTE — Patient Instructions (Signed)

## 2021-11-20 NOTE — Progress Notes (Signed)
Subjective:    Patient ID: Sandra Adams, female    DOB: 01-07-1966, 55 y.o.   MRN: [id number]  Chief Complaint  Patient presents with   Medical Management of Chronic Issues   Urinary Tract Infection   PT presents to the office today for chronic follow up.  Urinary Frequency  This is a new problem. The current episode started 1 to 4 weeks ago. The problem occurs intermittently. The patient is experiencing no pain. Associated symptoms include frequency and urgency. Pertinent negatives include no hematuria, hesitancy or nausea. She has tried increased fluids for the symptoms. The treatment provided mild relief.  Gastroesophageal Reflux She complains of belching and heartburn. She reports no nausea. This is a chronic problem. The current episode started more than 1 year ago. The problem occurs occasionally. She has tried a PPI for the symptoms. The treatment provided moderate relief.  Arthritis Presents for follow-up visit. She complains of pain and stiffness. She reports no joint swelling. The symptoms have been stable. Affected locations include the left knee, right knee, left hip and right hip. Her pain is at a severity of 6/10.  Anxiety Presents for follow-up visit. Symptoms include excessive worry, irritability, nervous/anxious behavior and restlessness. Patient reports no nausea. Symptoms occur occasionally. The severity of symptoms is moderate.    Depression        This is a chronic problem.  The current episode started more than 1 year ago.   The onset quality is gradual.   The problem occurs intermittently.  Associated symptoms include irritable, restlessness and sad.  Associated symptoms include no helplessness and no hopelessness.  Past treatments include SNRIs - Serotonin and norepinephrine reuptake inhibitors.  Past medical history includes anxiety.      Review of Systems  Constitutional:  Positive for irritability.  Gastrointestinal:  Positive for heartburn. Negative for  nausea.  Genitourinary:  Positive for frequency and urgency. Negative for hematuria and hesitancy.  Musculoskeletal:  Positive for arthritis and stiffness. Negative for joint swelling.  Psychiatric/Behavioral:  Positive for depression. The patient is nervous/anxious.   All other systems reviewed and are negative.     Objective:   Physical Exam Vitals reviewed.  Constitutional:      General: She is irritable. She is not in acute distress.    Appearance: She is well-developed.  HENT:     Head: Normocephalic and atraumatic.     Right Ear: Tympanic membrane normal.     Left Ear: Tympanic membrane normal.  Eyes:     Pupils: Pupils are equal, round, and reactive to light.  Neck:     Thyroid: No thyromegaly.  Cardiovascular:     Rate and Rhythm: Normal rate and regular rhythm.     Heart sounds: Normal heart sounds. No murmur heard. Pulmonary:     Effort: Pulmonary effort is normal. No respiratory distress.     Breath sounds: Normal breath sounds. No wheezing.  Abdominal:     General: Bowel sounds are normal. There is no distension.     Palpations: Abdomen is soft.     Tenderness: There is no abdominal tenderness.  Musculoskeletal:        General: No tenderness. Normal range of motion.     Cervical back: Normal range of motion and neck supple.  Skin:    General: Skin is warm and dry.  Neurological:     Mental Status: She is alert and oriented to person, place, and time.     Cranial Nerves: No cranial  nerve deficit.     Deep Tendon Reflexes: Reflexes are normal and symmetric.  Psychiatric:        Behavior: Behavior normal.        Thought Content: Thought content normal.        Judgment: Judgment normal.     BP 121/78   Pulse 73   Temp (!) 97.4 F (36.3 C) (Temporal)   Ht _0  (1.575 m)   Wt 141 lb (64 kg)   LMP 10/16/2014   BMI 25.79 kg/m       Assessment & Plan:  Sandra Adams comes in today with chief complaint of Medical Management of Chronic Issues and  Urinary Tract Infection   Diagnosis and orders addressed:  1. Vaginal itching  - CMP14+EGFR - CBC with Differential/Platelet  2. Dysuria  - Urinalysis, Complete - CMP14+EGFR - CBC with Differential/Platelet - Urine Culture  3. Gastroesophageal reflux disease without esophagitis - CMP14+EGFR - CBC with Differential/Platelet - omeprazole (PRILOSEC) 20 MG capsule; Take 1 capsule (20 mg total) by mouth daily.  Dispense: 90 capsule; Refill: 1  4. Primary osteoarthritis involving multiple joints - CMP14+EGFR - CBC with Differential/Platelet  5. GAD (generalized anxiety disorder) - ToxASSURE Select 13 (MW), Urine - ALPRAZolam (XANAX) 0.25 MG tablet; Take 1 tablet (0.25 mg total) by mouth 2 (two) times daily as needed for anxiety.  Dispense: 45 tablet; Refill: 2 - CMP14+EGFR - CBC with Differential/Platelet  6. Controlled substance agreement signed - ToxASSURE Select 13 (MW), Urine - ALPRAZolam (XANAX) 0.25 MG tablet; Take 1 tablet (0.25 mg total) by mouth 2 (two) times daily as needed for anxiety.  Dispense: 45 tablet; Refill: 2 - CMP14+EGFR - CBC with Differential/Platelet - Drug Screen 10 W/Conf, Se  7. Overweight (BMI 25.0-29.9) - ALPRAZolam (XANAX) 0.25 MG tablet; Take 1 tablet (0.25 mg total) by mouth 2 (two) times daily as needed for anxiety.  Dispense: 45 tablet; Refill: 2 - CMP14+EGFR - CBC with Differential/Platelet  8. Moderate episode of recurrent major depressive disorder (HCC) - CMP14+EGFR - CBC with Differential/Platelet  9. Post-menopausal - DG [facility] DEXA  10. Colon cancer screening  - Cologuard  11. Seasonal allergic rhinitis due to pollen - fluticasone (FLONASE) 50 MCG/ACT nasal spray; Place 2 sprays into both nostrils daily.  Dispense: 16 g; Refill: 6   Labs pending Patient reviewed in [place] controlled database, no flags noted. Contract and drug screen are up to date.  Health Maintenance reviewed Diet and exercise encouraged  Follow up plan: 3  months    [name], FNP

## 2021-11-22 ENCOUNTER — Other Ambulatory Visit: Payer: Self-pay | Admitting: Family

## 2021-11-22 DIAGNOSIS — F411 Generalized anxiety disorder: Secondary | ICD-10-CM

## 2021-11-22 DIAGNOSIS — F331 Major depressive disorder, recurrent, moderate: Secondary | ICD-10-CM

## 2021-11-23 ENCOUNTER — Telehealth: Payer: Self-pay | Admitting: Family

## 2021-11-23 LAB — URINE CULTURE

## 2021-11-23 NOTE — Telephone Encounter (Signed)
Pt called to let Alyse Low know that the wrong stomach medicine was sent in for her. Pt says she cant take Omeprazole because it makes her feel really weird. Needs Pantoprazole sent in for her.

## 2021-11-24 ENCOUNTER — Ambulatory Visit: Payer: Medicare Other | Admitting: Family

## 2021-11-24 MED ORDER — PANTOPRAZOLE SODIUM 20 MG PO TBEC: 20.0000 mg | DELAYED_RELEASE_TABLET | Freq: Every day | ORAL | 1 refills | Status: DC

## 2021-11-24 NOTE — Telephone Encounter (Signed)
Prescription sent to pharmacy.

## 2021-11-26 ENCOUNTER — Telehealth: Payer: Self-pay | Admitting: Family

## 2021-11-26 ENCOUNTER — Ambulatory Visit: Payer: Medicare Other | Admitting: Licensed Clinical Social Worker

## 2021-11-26 DIAGNOSIS — M8589 Other specified disorders of bone density and structure, multiple sites: Secondary | ICD-10-CM

## 2021-11-26 DIAGNOSIS — F331 Major depressive disorder, recurrent, moderate: Secondary | ICD-10-CM

## 2021-11-26 DIAGNOSIS — F411 Generalized anxiety disorder: Secondary | ICD-10-CM

## 2021-11-26 DIAGNOSIS — G43009 Migraine without aura, not intractable, without status migrainosus: Secondary | ICD-10-CM

## 2021-11-26 DIAGNOSIS — M159 Polyosteoarthritis, unspecified: Secondary | ICD-10-CM

## 2021-11-26 DIAGNOSIS — K219 Gastro-esophageal reflux disease without esophagitis: Secondary | ICD-10-CM

## 2021-11-26 LAB — DRUG SCREEN 10 W/CONF, SERUM
Amphetamines, IA: NEGATIVE ng/mL
Barbiturates, IA: NEGATIVE ug/mL
Benzodiazepines, IA: NEGATIVE ng/mL
Cocaine & Metabolite, IA: NEGATIVE ng/mL
Methadone, IA: NEGATIVE ng/mL
Opiates, IA: NEGATIVE ng/mL
Oxycodones, IA: NEGATIVE ng/mL
Phencyclidine, IA: NEGATIVE ng/mL
Propoxyphene, IA: NEGATIVE ng/mL
THC(Marijuana) Metabolite, IA: NEGATIVE ng/mL

## 2021-11-26 MED ORDER — PANTOPRAZOLE SODIUM 20 MG PO TBEC: 20.0000 mg | DELAYED_RELEASE_TABLET | Freq: Every day | ORAL | 1 refills | Status: DC

## 2021-11-26 NOTE — Telephone Encounter (Signed)
Patient aware and verbalized understanding. °

## 2021-11-26 NOTE — Patient Instructions (Addendum)
Visit Information  Patient Goals:  Manage My Emotions (Patient). Manage anxiety and stress issues faced. Manage pain issues  Timeframe:  Short-Term Goal Priority:  High Progress: Not On Track Start Date:    11/26/21                   Expected End Date:   02/19/22             Follow Up Date 01/22/22 at 4:00 PM   Manage My Emotions (Patient) Manage anxiety and stress issues faced . Manage pain issues   Why is this important?   When you are stressed, down or upset, your body reacts too.  For example, your blood pressure may get higher; you may have a headache or stomachache.  When your emotions get the best of you, your body's ability to fight off cold and flu gets weak.  These steps will help you manage your emotions.     Patient Self Care Activities:  Likes to walk her dog Eats meals independently Talks with family and friends via phone  Patient Coping Strengths:  Support of friends Does ADLs independently Eats meals independently  Patient Self Care Deficits:  Transport needs Pain issues  Patient Goals:  - spend time or talk with others every day - practice relaxation or meditation daily - keep a calendar with appointment dates  Follow Up Plan:  LCSW to call client on 01/22/22 at 4:00 PM to assess client needs  Norva Riffle.Aila Terra MSW, LCSW Licensed Clinical Social Worker [facility] Care Management [phone]
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
## 2021-11-29 LAB — COLOGUARD

## 2021-11-30 ENCOUNTER — Telehealth: Payer: Self-pay

## 2021-11-30 NOTE — Telephone Encounter (Signed)
Called patient to do AWV but patient is currently at the hairdresser and is unable to do her visit. Pt requests visit to be rescheduled.  Pt asked about her lab results. Results discussed. Pt asks if she should be taking Vitamin D and how much she should take? Thank you!

## 2021-12-05 DIAGNOSIS — Z1211 Encounter for screening for malignant neoplasm of colon: Secondary | ICD-10-CM | POA: Diagnosis not present

## 2021-12-07 ENCOUNTER — Ambulatory Visit (INDEPENDENT_AMBULATORY_CARE_PROVIDER_SITE_OTHER): Payer: Medicare Other

## 2021-12-07 VITALS — Ht 62.0 in | Wt 141.0 lb

## 2021-12-07 DIAGNOSIS — Z0000 Encounter for general adult medical examination without abnormal findings: Secondary | ICD-10-CM

## 2021-12-07 NOTE — Progress Notes (Signed)
Subjective:   Sandra Adams is a 55 y.o. female who presents for Medicare Annual (Subsequent) preventive examination. Virtual Visit via Telephone Note  I connected with  Sandra Adams on 12/07/21 at 11:15 AM EST by telephone and verified that I am speaking with the correct person using two identifiers.  Location: Patient: Home Provider: [facility] Persons participating in the virtual visit: patient/Nurse Health Advisor   I discussed the limitations, risks, security and privacy concerns of performing an evaluation and management service by telephone and the availability of in person appointments. The patient expressed understanding and agreed to proceed.  Interactive audio and video telecommunications were attempted between this nurse and patient, however failed, due to patient having technical difficulties OR patient did not have access to video capability.  We continued and completed visit with audio only.  Some vital signs may be absent or patient reported.   Sandra Driver, LPN  Review of Systems     Cardiac Risk Factors include: sedentary lifestyle;Other (see comment), Risk factor comments: Osteopenia, Back pain, Migraines, Anxiety.     Objective:    Today's Vitals   12/07/21 1118  Weight: 141 lb (64 kg)  Height: 5\' 2"  (1.575 m)   Body mass index is 25.79 kg/m.  Advanced Directives 12/07/2021 08/16/2019 04/22/2017 06/14/2016 05/09/2015  Does Patient Have a Medical Advance Directive? No No No No No  Would patient like information on creating a medical advance directive? No - Patient declined No - Patient declined Yes (MAU/Ambulatory/Procedural Areas - Information given) - No - patient declined information    Current Medications (verified) Outpatient Encounter Medications as of 12/07/2021  Medication Sig   ALPRAZolam (XANAX) 0.25 MG tablet Take 1 tablet (0.25 mg total) by mouth 2 (two) times daily as needed for anxiety.   cetirizine (ZYRTEC ALLERGY) 10 MG tablet Take 1  tablet (10 mg total) by mouth daily.   fluticasone (FLONASE) 50 MCG/ACT nasal spray Place 2 sprays into both nostrils daily.   pantoprazole (PROTONIX) 20 MG tablet Take 1 tablet (20 mg total) by mouth daily.   hydrocortisone 1 % lotion Apply 1 application topically 2 (two) times daily. (Patient not taking: Reported on 12/07/2021)   No facility-administered encounter medications on file as of 12/07/2021.    Allergies (verified) Ciprofloxacin, Imitrex [sumatriptan], Prednisone, Tylenol [acetaminophen], and Vicodin [hydrocodone-acetaminophen]   History: Past Medical History:  Diagnosis Date   Anxiety    Arthritis    Depression    Menopausal symptom    Migraine    Scoliosis    Past Surgical History:  Procedure Laterality Date   KNEE SURGERY Left 1974 and 1979   Family History  Problem Relation Age of Onset   Epilepsy Mother    COPD Father    Hyperlipidemia Father    Heart disease Father    Cancer Father    COPD Sister    Anxiety disorder Sister    COPD Maternal Aunt    Diabetes Maternal Uncle    Diabetes Maternal Aunt        x2   Colon cancer Neg Hx    Colon polyps Neg Hx    Esophageal cancer Neg Hx    Gallbladder disease Neg Hx    Social History   Socioeconomic History   Marital status: Divorced    Spouse name: Not on file   Number of children: 0   Years of education: 12th   Highest education level: High school graduate  Occupational History   Occupation:  Diability    Comment: Back : [place] Knee surgery   Tobacco Use   Smoking status: Never   Smokeless tobacco: Never  Vaping Use   Vaping Use: Never used  Substance and Sexual Activity   Alcohol use: No   Drug use: No   Sexual activity: Not Currently    Partners: Male    Birth control/protection: None  Other Topics Concern   Not on file  Social History Narrative   Single, no children. Right handed. 12 th grade. No caffeine.   Divorced since 2004.   Social Determinants of Health   Financial  Resource Strain: Low Risk    Difficulty of Paying Living Expenses: Not hard at all  Food Insecurity: No Food Insecurity   Worried About Charity fundraiser in the Last Year: Never true   [place] in the Last Year: Never true  Transportation Needs: No Transportation Needs   Lack of Transportation (Medical): No   Lack of Transportation (Non-Medical): No  Physical Activity: Insufficiently Active   Days of Exercise per Week: 3 days   Minutes of Exercise per Session: 30 min  Stress: Stress Concern Present   Feeling of Stress : Rather much  Social Connections: Moderately Integrated   Frequency of Communication with Friends and Family: More than three times a week   Frequency of Social Gatherings with Friends and Family: More than three times a week   Attends Religious Services: More than 4 times per year   Active Member of [employer] or Organizations: Yes   Attends Music therapist: More than 4 times per year   Marital Status: Divorced    Tobacco Counseling Counseling given: Not Answered   Clinical Intake:  Pre-visit preparation completed: Yes  Pain : No/denies pain     BMI - recorded: 25.79 Nutritional Status: BMI 25 -29 Overweight Nutritional Risks: None Diabetes: No  How often do you need to have someone help you when you read instructions, pamphlets, or other written materials from your doctor or pharmacy?: 1 - Never  Diabetic?No  Interpreter Needed?: No  Information entered by :: Sandra Tifini Reeder, lpn   Activities of Daily Living In your present state of health, do you have any difficulty performing the following activities: 12/07/2021  Hearing? N  Vision? N  Difficulty concentrating or making decisions? N  Walking or climbing stairs? N  Dressing or bathing? N  Doing errands, shopping? N  Preparing Food and eating ? N  Using the Toilet? N  In the past six months, have you accidently leaked urine? N  Do you have problems with loss of bowel control? N   Managing your Medications? N  Managing your Finances? N  Housekeeping or managing your Housekeeping? N  Some recent data might be hidden    Patient Care Team: Sharion Balloon, FNP as PCP - General (Family Medicine) Jonnie Kind, MD (Inactive) as Consulting Physician (Obstetrics and Gynecology) Sandra Cabal, LCSW as [place] [employer] (Licensed Clinical Social Worker) Ilean China, RN as Case Manager  Indicate any recent [place] you may have received from other than [facility] providers in the past year (date may be approximate).     Assessment:   This is a routine wellness examination for Clarke.  Hearing/Vision screen Hearing Screening - Comments:: No hearing issues.  Vision Screening - Comments:: [facility]. 2021.  Dietary issues and exercise activities discussed: Current Exercise Habits: Home exercise routine, Type of exercise:  walking;stretching, Time (Minutes): 30, Frequency (Times/Week): 5, Weekly Exercise (Minutes/Week): 150, Intensity: Mild, Exercise limited by: orthopedic condition(s);psychological condition(s)   Goals Addressed             This Visit's Progress    awv   On track    08/16/2019 AWV Goal: Exercise for General Health  Patient will verbalize understanding of the benefits of increased physical activity: Exercising regularly is important. It will improve your overall fitness, flexibility, and endurance. Regular exercise also will improve your overall health. It can help you control your weight, reduce stress, and improve your bone density. Over the next year, patient will increase physical activity as tolerated with a goal of at least 150 minutes of moderate physical activity per week.  You can tell that you are exercising at a moderate intensity if your heart starts beating faster and you start breathing faster but can still hold a conversation. Moderate-intensity exercise ideas include: Walking 1  mile (1.6 km) in about 15 minutes Biking Hiking Golfing Dancing Water aerobics Patient will verbalize understanding of everyday activities that increase physical activity by providing examples like the following: Yard work, such as: Sales promotion account executive Gardening Washing windows or floors Patient will be able to explain general safety guidelines for exercising:  Before you start a new exercise program, talk with your health care provider. Do not exercise so much that you hurt yourself, feel dizzy, or get very short of breath. Wear comfortable clothes and wear shoes with good support. Drink plenty of water while you exercise to prevent dehydration or heat stroke. Work out until your breathing and your heartbeat get faster.      Manage Anxiety and Stress issues faced. Manage pain issues. Manage My Emotions (Patient)   Not on track    Timeframe:  Short-Term Goal Priority:  High Progress: Not On Track Start Date:    11/26/21                   Expected End Date:   02/19/22             Follow Up Date 01/22/22 at 4:00 PM   Manage My Emotions (Patient) Manage anxiety and stress issues faced . Manage pain issues   Why is this important?   When you are stressed, down or upset, your body reacts too.  For example, your blood pressure may get higher; you may have a headache or stomachache.  When your emotions get the best of you, your body's ability to fight off cold and flu gets weak.  These steps will help you manage your emotions.     Patient Self Care Activities:  Likes to walk her dog Eats meals independently Talks with family and friends via phone  Patient Coping Strengths:  Support of friends Does ADLs independently Eats meals independently  Patient Self Care Deficits:  Transport needs Pain issues  Patient Goals:  - spend time or talk with others every day - practice relaxation or meditation  daily - keep a calendar with appointment dates  Follow Up Plan:  LCSW to call client on 01/22/22 at 4:00 PM to assess client needs       Depression Screen PHQ 2/9 Scores 12/07/2021 11/26/2021 11/20/2021 09/30/2021 09/01/2021 08/18/2021 08/18/2021  PHQ - 2 Score 1 2 1 2  0 2 2  PHQ- 9 Score 3 8 4 6 2 5 5     Fall Risk Fall Risk  12/07/2021 11/20/2021 08/18/2021 04/16/2021 08/16/2019  Falls in the past year? 0 0 0 0 0  Number falls in past yr: 0 - - 0 -  Injury with Fall? 0 - - 0 -  Comment - - - - -  Risk Factor Category  - - - - -  Risk for fall due to : No Fall Risks - - - -  Follow up Falls prevention discussed - - - -    FALL RISK PREVENTION PERTAINING TO THE HOME:  Any stairs in or around the home? No  If so, are there any without handrails? No  Home free of loose throw rugs in walkways, pet beds, electrical cords, etc? Yes  Adequate lighting in your home to reduce risk of falls? Yes   ASSISTIVE DEVICES UTILIZED TO PREVENT FALLS:  Life alert? No  Use of a cane, walker or w/Adams? No  Grab bars in the bathroom? Yes  Shower chair or bench in shower? No  Elevated toilet seat or a handicapped toilet? No   TIMED UP AND GO:  Was the test performed? No .  Phone visit.   Cognitive Function: MMSE - Mini Mental State Exam 04/22/2017 05/09/2015  Not completed: - Refused  Orientation to time 5 5  Orientation to Place 5 5  Registration 3 3  Attention/ Calculation 3 5  Recall 3 2  Language- name 2 objects 2 2  Language- repeat 1 1  Language- follow 3 step command 1 3  Language- read & follow direction 1 1  Write a sentence 1 1  Copy design 1 1  Total score 26 29     6CIT Screen 12/07/2021 08/16/2019  What Year? 0 points 0 points  What month? 0 points 0 points  What time? 0 points 0 points  Count back from 20 0 points 0 points  Months in reverse 2 points 0 points  Repeat phrase 2 points 0 points  Total Score 4 0    Immunizations Immunization History  Administered Date(s)  Administered   Tdap 12/21/2007    TDAP status: Due, Education has been provided regarding the importance of this vaccine. Advised may receive this vaccine at local pharmacy or Health Dept. Aware to provide a copy of the vaccination record if obtained from local pharmacy or Health Dept. Verbalized acceptance and understanding.  Flu Vaccine status: Declined, Education has been provided regarding the importance of this vaccine but patient still declined. Advised may receive this vaccine at local pharmacy or Health Dept. Aware to provide a copy of the vaccination record if obtained from local pharmacy or Health Dept. Verbalized acceptance and understanding.  Pneumococcal vaccine status: Declined,  Education has been provided regarding the importance of this vaccine but patient still declined. Advised may receive this vaccine at local pharmacy or Health Dept. Aware to provide a copy of the vaccination record if obtained from local pharmacy or Health Dept. Verbalized acceptance and understanding.   Covid-19 vaccine status: Declined, Education has been provided regarding the importance of this vaccine but patient still declined. Advised may receive this vaccine at local pharmacy or Health Dept.or vaccine clinic. Aware to provide a copy of the vaccination record if obtained from local pharmacy or Health Dept. Verbalized acceptance and understanding.  Qualifies for Shingles Vaccine? Yes   Zostavax completed No   Shingrix Completed?: No.    Education has been provided regarding the importance of this vaccine. Patient has been advised to call insurance company to determine out of pocket expense  if they have not yet received this vaccine. Advised may also receive vaccine at local pharmacy or Health Dept. Verbalized acceptance and understanding.  Screening Tests Health Maintenance  Topic Date Due   Zoster Vaccines- Shingrix (1 of 2) Never done   DEXA SCAN  05/10/2019   INFLUENZA VACCINE  03/19/2022  (Originally 07/20/2021)   Pneumococcal Vaccine 35-60 Years old (1 - PCV) 07/29/2022 (Originally 08/28/1972)   TETANUS/TDAP  09/01/2022 (Originally 12/20/2017)   Fecal DNA (Cologuard)  09/01/2022 (Originally 01/17/2021)   MAMMOGRAM  06/25/2023   PAP SMEAR-Modifier  04/16/2024   Hepatitis Adams Screening  Completed   HIV Screening  Completed   HPV VACCINES  Aged Out    Health Maintenance  Health Maintenance Due  Topic Date Due   Zoster Vaccines- Shingrix (1 of 2) Never done   DEXA SCAN  05/10/2019    Colorectal cancer screening: Type of screening: Cologuard. Completed 01/17/2018. Repeat every 3 years  Mammogram status: Completed 08/17/2021. Repeat every year  Bone Density status: Ordered Pt declined at this time. Pt provided with contact info and advised to call to schedule appt.  Lung Cancer Screening: (Low Dose CT Chest recommended if Age 46-80 years, 30 pack-year currently smoking OR have quit w/in 15years.) does not qualify.    Additional Screening:  Hepatitis Adams Screening: does qualify; Completed Due  Vision Screening: Recommended annual ophthalmology exams for early detection of glaucoma and other disorders of the eye. Is the patient up to date with their annual eye exam?  Yes  Who is the provider or what is the name of the office in which the patient attends annual eye exams? [facility] If pt is not established with a provider, would they like to be referred to a provider to establish care? No .   Dental Screening: Recommended annual dental exams for proper oral hygiene  Community Resource Referral / Chronic Care Management: CRR required this visit?  No   CCM required this visit?  No      Plan:     I have personally reviewed and noted the following in the patients chart:   Medical and social history Use of alcohol, tobacco or illicit drugs  Current medications and supplements including opioid prescriptions.  Functional ability and status Nutritional  status Physical activity Advanced directives List of other physicians Hospitalizations, surgeries, and ER visits in previous 12 months Vitals Screenings to include cognitive, depression, and falls Referrals and appointments  In addition, I have reviewed and discussed with patient certain preventive protocols, quality metrics, and best practice recommendations. A written personalized care plan for preventive services as well as general preventive health recommendations were provided to patient.     Sandra Driver, LPN   49/20/1007   Nurse Notes: Pt Adams/o issues with depression and anxiety. Offered counseling services but pt declined at this time. Discussed vaccines and patient declines all at this time. Cologuard  and bone denisty due but patient would like to wait at this time.

## 2021-12-07 NOTE — Patient Instructions (Signed)
Ms. Sandra Adams , Thank you for taking time to come for your Medicare Wellness Visit. I appreciate your ongoing commitment to your health goals. Please review the following plan we discussed and let me know if I can assist you in the future.   Screening recommendations/referrals: Colonoscopy: Cologuard done 01/17/2018. Repeat due. Call new MD after the first year to schedule.  Mammogram: Done 08/17/2021. Repeat annually. Call new MD after the first year to schedule.   Bone Density: Done 05/09/2017. Due. Call new MD after the first year to schedule.   Recommended yearly ophthalmology/optometry visit for glaucoma screening and checkup Recommended yearly dental visit for hygiene and checkup  Vaccinations: Influenza vaccine: Declined.  Pneumococcal vaccine: Due at age age 45. Tdap vaccine: Done 12/21/2007. Repeat in 10 years  Shingles vaccine: Shingrix discussed. Please contact your pharmacy for coverage information.    Covid-19: Declined.  Advanced directives: Advance directive discussed with you today. Even though you declined this today, please call our office should you change your mind, and we can give you the proper paperwork for you to fill out.   Conditions/risks identified: Aim for 30 minutes of exercise or brisk walking each day, drink 6-8 glasses of water and eat lots of fruits and vegetables. KEEP UP THE GOOD WORK!!!  Next appointment: Follow up in one year for your annual wellness visit. 2023.  Preventive Care 40-64 Years, Female Preventive care refers to lifestyle choices and visits with your health care provider that can promote health and wellness. What does preventive care include? A yearly physical exam. This is also called an annual well check. Dental exams once or twice a year. Routine eye exams. Ask your health care provider how often you should have your eyes checked. Personal lifestyle choices, including: Daily care of your teeth and gums. Regular physical  activity. Eating a healthy diet. Avoiding tobacco and drug use. Limiting alcohol use. Practicing safe sex. Taking low-dose aspirin daily starting at age 83. Taking vitamin and mineral supplements as recommended by your health care provider. What happens during an annual well check? The services and screenings done by your health care provider during your annual well check will depend on your age, overall health, lifestyle risk factors, and family history of disease. Counseling  Your health care provider may ask you questions about your: Alcohol use. Tobacco use. Drug use. Emotional well-being. Home and relationship well-being. Sexual activity. Eating habits. Work and work Statistician. Method of birth control. Menstrual cycle. Pregnancy history. Screening  You may have the following tests or measurements: Height, weight, and BMI. Blood pressure. Lipid and cholesterol levels. These may be checked every 5 years, or more frequently if you are over 100 years old. Skin check. Lung cancer screening. You may have this screening every year starting at age 21 if you have a 30-pack-year history of smoking and currently smoke or have quit within the past 15 years. Fecal occult blood test (FOBT) of the stool. You may have this test every year starting at age 41. Flexible sigmoidoscopy or colonoscopy. You may have a sigmoidoscopy every 5 years or a colonoscopy every 10 years starting at age 26. Hepatitis C blood test. Hepatitis B blood test. Sexually transmitted disease (STD) testing. Diabetes screening. This is done by checking your blood sugar (glucose) after you have not eaten for a while (fasting). You may have this done every 1-3 years. Mammogram. This may be done every 1-2 years. Talk to your health care provider about when you should start having regular  mammograms. This may depend on whether you have a family history of breast cancer. BRCA-related cancer screening. This may be done if  you have a family history of breast, ovarian, tubal, or peritoneal cancers. Pelvic exam and Pap test. This may be done every 3 years starting at age 28. Starting at age 12, this may be done every 5 years if you have a Pap test in combination with an HPV test. Bone density scan. This is done to screen for osteoporosis. You may have this scan if you are at high risk for osteoporosis. Discuss your test results, treatment options, and if necessary, the need for more tests with your health care provider. Vaccines  Your health care provider may recommend certain vaccines, such as: Influenza vaccine. This is recommended every year. Tetanus, diphtheria, and acellular pertussis (Tdap, Td) vaccine. You may need a Td booster every 10 years. Zoster vaccine. You may need this after age 71. Pneumococcal 13-valent conjugate (PCV13) vaccine. You may need this if you have certain conditions and were not previously vaccinated. Pneumococcal polysaccharide (PPSV23) vaccine. You may need one or two doses if you smoke cigarettes or if you have certain conditions. Talk to your health care provider about which screenings and vaccines you need and how often you need them. This information is not intended to replace advice given to you by your health care provider. Make sure you discuss any questions you have with your health care provider. Document Released: 01/02/2016 Document Revised: 08/25/2016 Document Reviewed: 10/07/2015 Elsevier Interactive Patient Education  2017 [place] Prevention in the Home Falls can cause injuries. They can happen to people of all ages. There are many things you can do to make your home safe and to help prevent falls. What can I do on the outside of my home? Regularly fix the edges of walkways and driveways and fix any cracks. Remove anything that might make you trip as you walk through a door, such as a raised step or threshold. Trim any bushes or trees on the path to your  home. Use bright outdoor lighting. Clear any walking paths of anything that might make someone trip, such as rocks or tools. Regularly check to see if handrails are loose or broken. Make sure that both sides of any steps have handrails. Any raised decks and porches should have guardrails on the edges. Have any leaves, snow, or ice cleared regularly. Use sand or salt on walking paths during winter. Clean up any spills in your garage right away. This includes oil or grease spills. What can I do in the bathroom? Use night lights. Install grab bars by the toilet and in the tub and shower. Do not use towel bars as grab bars. Use non-skid mats or decals in the tub or shower. If you need to sit down in the shower, use a plastic, non-slip stool. Keep the floor dry. Clean up any water that spills on the floor as soon as it happens. Remove soap buildup in the tub or shower regularly. Attach bath mats securely with double-sided non-slip rug tape. Do not have throw rugs and other things on the floor that can make you trip. What can I do in the bedroom? Use night lights. Make sure that you have a light by your bed that is easy to reach. Do not use any sheets or blankets that are too big for your bed. They should not hang down onto the floor. Have a firm chair that has side  arms. You can use this for support while you get dressed. Do not have throw rugs and other things on the floor that can make you trip. What can I do in the kitchen? Clean up any spills right away. Avoid walking on wet floors. Keep items that you use a lot in easy-to-reach places. If you need to reach something above you, use a strong step stool that has a grab bar. Keep electrical cords out of the way. Do not use floor polish or wax that makes floors slippery. If you must use wax, use non-skid floor wax. Do not have throw rugs and other things on the floor that can make you trip. What can I do with my stairs? Do not leave any  items on the stairs. Make sure that there are handrails on both sides of the stairs and use them. Fix handrails that are broken or loose. Make sure that handrails are as long as the stairways. Check any carpeting to make sure that it is firmly attached to the stairs. Fix any carpet that is loose or worn. Avoid having throw rugs at the top or bottom of the stairs. If you do have throw rugs, attach them to the floor with carpet tape. Make sure that you have a light switch at the top of the stairs and the bottom of the stairs. If you do not have them, ask someone to add them for you. What else can I do to help prevent falls? Wear shoes that: Do not have high heels. Have rubber bottoms. Are comfortable and fit you well. Are closed at the toe. Do not wear sandals. If you use a stepladder: Make sure that it is fully opened. Do not climb a closed stepladder. Make sure that both sides of the stepladder are locked into place. Ask someone to hold it for you, if possible. Clearly mark and make sure that you can see: Any grab bars or handrails. First and last steps. Where the edge of each step is. Use tools that help you move around (mobility aids) if they are needed. These include: Canes. Walkers. Scooters. Crutches. Turn on the lights when you go into a dark area. Replace any light bulbs as soon as they burn out. Set up your furniture so you have a clear path. Avoid moving your furniture around. If any of your floors are uneven, fix them. If there are any pets around you, be aware of where they are. Review your medicines with your doctor. Some medicines can make you feel dizzy. This can increase your chance of falling. Ask your doctor what other things that you can do to help prevent falls. This information is not intended to replace advice given to you by your health care provider. Make sure you discuss any questions you have with your health care provider. Document Released: 10/02/2009  Document Revised: 05/13/2016 Document Reviewed: 01/10/2015 Elsevier Interactive Patient Education  2017 [employer].

## 2021-12-18 ENCOUNTER — Telehealth: Payer: Medicare Other

## 2021-12-20 LAB — COLOGUARD: COLOGUARD: NEGATIVE

## 2021-12-31 ENCOUNTER — Ambulatory Visit: Payer: Medicare Other | Admitting: Family Medicine

## 2022-01-11 ENCOUNTER — Ambulatory Visit: Payer: Medicare Other | Admitting: Family

## 2022-01-15 ENCOUNTER — Other Ambulatory Visit: Payer: Self-pay | Admitting: Family

## 2022-01-15 DIAGNOSIS — G4700 Insomnia, unspecified: Secondary | ICD-10-CM

## 2022-01-19 ENCOUNTER — Other Ambulatory Visit: Payer: Medicare Other

## 2022-01-22 ENCOUNTER — Telehealth: Payer: Medicare Other

## 2022-01-26 ENCOUNTER — Encounter: Payer: Self-pay | Admitting: Family Medicine

## 2022-01-26 ENCOUNTER — Ambulatory Visit (INDEPENDENT_AMBULATORY_CARE_PROVIDER_SITE_OTHER): Payer: Medicare Other | Admitting: Family Medicine

## 2022-01-26 DIAGNOSIS — N3001 Acute cystitis with hematuria: Secondary | ICD-10-CM

## 2022-01-26 DIAGNOSIS — N390 Urinary tract infection, site not specified: Secondary | ICD-10-CM | POA: Diagnosis not present

## 2022-01-26 LAB — WET PREP FOR TRICH, YEAST, CLUE
Clue Cell Exam: NEGATIVE
Trichomonas Exam: NEGATIVE
Yeast Exam: NEGATIVE

## 2022-01-26 MED ORDER — SULFAMETHOXAZOLE-TRIMETHOPRIM 800-160 MG PO TABS: 1.0000 | ORAL_TABLET | Freq: Two times a day (BID) | ORAL | 0 refills | Status: AC

## 2022-01-26 NOTE — Progress Notes (Signed)
Virtual Visit via telephone Note Due to COVID-19 pandemic this visit was conducted virtually. This visit type was conducted due to national recommendations for restrictions regarding the COVID-19 Pandemic (e.g. social distancing, sheltering in place) in an effort to limit this patient's exposure and mitigate transmission in our community. All issues noted in this document were discussed and addressed.  A physical exam was not performed with this format.   I connected with Sandra Adams on 01/26/2022 at 1645 by telephone and verified that I am speaking with the correct person using two identifiers. Sandra Adams is currently located at home and family is currently with them during visit. The provider, Monia Pouch, FNP is located in their office at time of visit.  I discussed the limitations, risks, security and privacy concerns of performing an evaluation and management service by virtual visit and the availability of in person appointments. I also discussed with the patient that there may be a patient responsible charge related to this service. The patient expressed understanding and agreed to proceed.  Subjective:  Patient ID: Sandra Adams, female    DOB: 09/27/1966, 56 y.o.   MRN: [id number]  Chief Complaint:  Urinary Tract Infection   HPI: Sandra Adams is a 56 y.o. female presenting on 01/26/2022 for Urinary Tract Infection   Urinary Tract Infection  This is a new problem. The current episode started yesterday. The problem occurs every urination. The problem has been waxing and waning. The quality of the pain is described as burning and aching. The pain is at a severity of 2/10. The pain is mild. There has been no fever. She is Not sexually active. There is No history of pyelonephritis. Associated symptoms include frequency and urgency. Pertinent negatives include no chills, discharge, flank pain, hematuria, hesitancy, nausea, possible pregnancy, sweats or vomiting. She has tried  increased fluids for the symptoms. The treatment provided no relief.    Relevant past medical, surgical, family, and social history reviewed and updated as indicated.  Allergies and medications reviewed and updated.   Past Medical History:  Diagnosis Date   Anxiety    Arthritis    Depression    Menopausal symptom    Migraine    Scoliosis     Past Surgical History:  Procedure Laterality Date   KNEE SURGERY Left 1974 and 1979    Social History   Socioeconomic History   Marital status: Divorced    Spouse name: Not on file   Number of children: 0   Years of education: 12th   Highest education level: High school graduate  Occupational History   Occupation: [employer]    Comment: Back : [employer] Knee surgery   Tobacco Use   Smoking status: Never   Smokeless tobacco: Never  Vaping Use   Vaping Use: Never used  Substance and Sexual Activity   Alcohol use: No   Drug use: No   Sexual activity: Not Currently    Partners: Male    Birth control/protection: None  Other Topics Concern   Not on file  Social History Narrative   Single, no children. Right handed. 12 th grade. No caffeine.   Divorced since 2004.   Social Determinants of Health   Financial Resource Strain: Low Risk    Difficulty of Paying Living Expenses: Not hard at all  Food Insecurity: No Food Insecurity   Worried About Charity fundraiser in the Last Year: Never true   [place] in the Last Year:  Never true  Transportation Needs: No Transportation Needs   Lack of Transportation (Medical): No   Lack of Transportation (Non-Medical): No  Physical Activity: Insufficiently Active   Days of Exercise per Week: 3 days   Minutes of Exercise per Session: 30 min  Stress: Stress Concern Present   Feeling of Stress : Rather much  Social Connections: Moderately Integrated   Frequency of Communication with Friends and Family: More than three times a week   Frequency of Social Gatherings with Friends and  Family: More than three times a week   Attends Religious Services: More than 4 times per year   Active Member of [employer] or Organizations: Yes   Attends Music therapist: More than 4 times per year   Marital Status: Divorced  Human resources officer Violence: Not At Risk   Fear of Current or Ex-Partner: No   Emotionally Abused: No   Physically Abused: No   Sexually Abused: No    Outpatient Encounter Medications as of 01/26/2022  Medication Sig   sulfamethoxazole-trimethoprim (BACTRIM DS) 800-160 MG tablet Take 1 tablet by mouth 2 (two) times daily for 5 days.   ALPRAZolam (XANAX) 0.25 MG tablet Take 1 tablet (0.25 mg total) by mouth 2 (two) times daily as needed for anxiety.   cetirizine (ZYRTEC ALLERGY) 10 MG tablet Take 1 tablet (10 mg total) by mouth daily.   fluticasone (FLONASE) 50 MCG/ACT nasal spray Place 2 sprays into both nostrils daily.   hydrocortisone 1 % lotion Apply 1 application topically 2 (two) times daily. (Patient not taking: Reported on 12/07/2021)   pantoprazole (PROTONIX) 20 MG tablet Take 1 tablet (20 mg total) by mouth daily.   No facility-administered encounter medications on file as of 01/26/2022.    Allergies  Allergen Reactions   Ciprofloxacin     Makes her feel weak,dizzy   Imitrex [Sumatriptan]     Blurred vision    Prednisone Nausea And Vomiting   Tylenol [Acetaminophen]     Tylenol #3,Headaches   Vicodin [Hydrocodone-Acetaminophen]     GI upset    Review of Systems  Constitutional:  Negative for appetite change, chills, diaphoresis, fatigue, fever and unexpected weight change.  HENT: Negative.    Eyes: Negative.   Respiratory:  Negative for cough, chest tightness and shortness of breath.   Cardiovascular:  Negative for chest pain, palpitations and leg swelling.  Gastrointestinal:  Negative for abdominal pain, blood in stool, constipation, diarrhea, nausea and vomiting.  Endocrine: Negative.   Genitourinary:  Positive for dysuria, frequency  and urgency. Negative for decreased urine volume, difficulty urinating, dyspareunia, enuresis, flank pain, genital sores, hematuria, hesitancy, menstrual problem, pelvic pain, vaginal bleeding, vaginal discharge and vaginal pain.  Musculoskeletal:  Negative for arthralgias and myalgias.  Skin: Negative.   Allergic/Immunologic: Negative.   Neurological:  Negative for dizziness, weakness and headaches.  Hematological: Negative.   Psychiatric/Behavioral:  Negative for confusion, hallucinations, sleep disturbance and suicidal ideas.   All other systems reviewed and are negative.       Observations/Objective: No vital signs or physical exam, this was a virtual health encounter.  Pt alert and oriented, answers all questions appropriately, and able to speak in full sentences.    Assessment and Plan: Sandra Adams was seen today for urinary tract infection.  Diagnoses and all orders for this visit:  Acute cystitis with hematuria Wet prep normal. Urinalysis with moderate bacteria and trace blood. Previous culture reviewed and antibiotic therapy based on this. Culture pending and will change therapy if warranted.  Symptomatic care discussed in detail. Report any new, worsening, or persistent symptoms. No indications of acute pyelonephritis.  -     WET PREP FOR TRICH, YEAST, CLUE -     Urinalysis, Routine w reflex microscopic -     CULTURE, URINE COMPREHENSIVE -     sulfamethoxazole-trimethoprim (BACTRIM DS) 800-160 MG tablet; Take 1 tablet by mouth 2 (two) times daily for 5 days.     Follow Up Instructions: Return if symptoms worsen or fail to improve.    I discussed the assessment and treatment plan with the patient. The patient was provided an opportunity to ask questions and all were answered. The patient agreed with the plan and demonstrated an understanding of the instructions.   The patient was advised to call back or seek an in-person evaluation if the symptoms worsen or if the condition  fails to improve as anticipated.  The above assessment and management plan was discussed with the patient. The patient verbalized understanding of and has agreed to the management plan. Patient is aware to call the clinic if they develop any new symptoms or if symptoms persist or worsen. Patient is aware when to return to the clinic for a follow-up visit. Patient educated on when it is appropriate to go to the emergency department.    I provided 15 minutes of time during this telephone encounter.   Monia Pouch, FNP-C [facility] [address] [place], [place] [address] [phone] 01/26/2022

## 2022-01-27 LAB — URINALYSIS, ROUTINE W REFLEX MICROSCOPIC
Bilirubin, UA: NEGATIVE
Glucose, UA: NEGATIVE
Ketones, UA: NEGATIVE
Leukocytes,UA: NEGATIVE
Nitrite, UA: NEGATIVE
Protein,UA: NEGATIVE
RBC, UA: NEGATIVE
Specific Gravity, UA: 1.02 (ref 1.005–1.030)
Urobilinogen, Ur: 0.2 mg/dL (ref 0.2–1.0)
pH, UA: 7.5 (ref 5.0–7.5)

## 2022-01-28 LAB — CULTURE, URINE COMPREHENSIVE

## 2022-02-15 ENCOUNTER — Telehealth: Payer: Medicare Other

## 2022-02-15 DIAGNOSIS — R300 Dysuria: Secondary | ICD-10-CM | POA: Diagnosis not present

## 2022-02-15 DIAGNOSIS — N3000 Acute cystitis without hematuria: Secondary | ICD-10-CM | POA: Diagnosis not present

## 2022-02-15 DIAGNOSIS — H6983 Other specified disorders of Eustachian tube, bilateral: Secondary | ICD-10-CM | POA: Diagnosis not present

## 2022-02-15 DIAGNOSIS — J029 Acute pharyngitis, unspecified: Secondary | ICD-10-CM | POA: Diagnosis not present

## 2022-02-15 DIAGNOSIS — J329 Chronic sinusitis, unspecified: Secondary | ICD-10-CM | POA: Diagnosis not present

## 2022-02-19 ENCOUNTER — Ambulatory Visit (INDEPENDENT_AMBULATORY_CARE_PROVIDER_SITE_OTHER): Payer: Medicare Other | Admitting: Family

## 2022-02-19 ENCOUNTER — Encounter: Payer: Self-pay | Admitting: Family

## 2022-02-19 VITALS — BP 137/79 | HR 99 | Temp 97.1°F | Ht 62.0 in | Wt 143.8 lb

## 2022-02-19 DIAGNOSIS — J069 Acute upper respiratory infection, unspecified: Secondary | ICD-10-CM | POA: Diagnosis not present

## 2022-02-19 DIAGNOSIS — J301 Allergic rhinitis due to pollen: Secondary | ICD-10-CM | POA: Diagnosis not present

## 2022-02-19 DIAGNOSIS — N3001 Acute cystitis with hematuria: Secondary | ICD-10-CM

## 2022-02-19 LAB — MICROSCOPIC EXAMINATION: RBC, Urine: NONE SEEN /hpf (ref 0–2)

## 2022-02-19 LAB — URINALYSIS, COMPLETE
Bilirubin, UA: NEGATIVE
Glucose, UA: NEGATIVE
Ketones, UA: NEGATIVE
Nitrite, UA: NEGATIVE
Protein,UA: NEGATIVE
Specific Gravity, UA: 1.025 (ref 1.005–1.030)
Urobilinogen, Ur: 0.2 mg/dL (ref 0.2–1.0)
pH, UA: 5 (ref 5.0–7.5)

## 2022-02-19 MED ORDER — GUAIFENESIN-DM 100-10 MG/5ML PO SYRP: 5.0000 mL | ORAL_SOLUTION | ORAL | 0 refills | Status: DC | PRN

## 2022-02-19 MED ORDER — CETIRIZINE HCL 10 MG PO TABS: 10.0000 mg | ORAL_TABLET | Freq: Every day | ORAL | 2 refills | Status: DC

## 2022-02-19 NOTE — Patient Instructions (Signed)
Upper Respiratory Infection, Adult ?An upper respiratory infection (URI) is a common viral infection of the nose, throat, and upper air passages that lead to the lungs. The most common type of URI is the common cold. URIs usually get better on their own, without medical treatment. ?What are the causes? ?A URI is caused by a virus. You may catch a virus by: ?Breathing in droplets from an infected person's cough or sneeze. ?Touching something that has been exposed to the virus (is contaminated) and then touching your mouth, nose, or eyes. ?What increases the risk? ?You are more likely to get a URI if: ?You are very young or very old. ?You have close contact with others, such as at work, school, or a health care facility. ?You smoke. ?You have long-term (chronic) heart or lung disease. ?You have a weakened disease-fighting system (immune system). ?You have nasal allergies or asthma. ?You are experiencing a lot of stress. ?You have poor nutrition. ?What are the signs or symptoms? ?A URI usually involves some of the following symptoms: ?Runny or stuffy (congested) nose. ?Cough. ?Sneezing. ?Sore throat. ?Headache. ?Fatigue. ?Fever. ?Loss of appetite. ?Pain in your forehead, behind your eyes, and over your cheekbones (sinus pain). ?Muscle aches. ?Redness or irritation of the eyes. ?Pressure in the ears or face. ?How is this diagnosed? ?This condition may be diagnosed based on your medical history and symptoms, and a physical exam. Your health care provider may use a swab to take a mucus sample from your nose (nasal swab). This sample can be tested to determine what virus is causing the illness. ?How is this treated? ?URIs usually get better on their own within 7-10 days. Medicines cannot cure URIs, but your health care provider may recommend certain medicines to help relieve symptoms, such as: ?Over-the-counter cold medicines. ?Cough suppressants. Coughing is a type of defense against infection that helps to clear the  respiratory system, so take these medicines only as recommended by your health care provider. ?Fever-reducing medicines. ?Follow these instructions at home: ?Activity ?Rest as needed. ?If you have a fever, stay home from work or school until your fever is gone or until your health care provider says your URI cannot spread to other people (is no longer contagious). Your health care provider may have you wear a face mask to prevent your infection from spreading. ?Relieving symptoms ?Gargle with a mixture of salt and water 3-4 times a day or as needed. To make salt water, completely dissolve ?-1 tsp (3-6 g) of salt in 1 cup (237 mL) of warm water. ?Use a cool-mist humidifier to add moisture to the air. This can help you breathe more easily. ?Eating and drinking ? ?Drink enough fluid to keep your urine pale yellow. ?Eat soups and other clear broths. ?General instructions ? ?Take over-the-counter and prescription medicines only as told by your health care provider. These include cold medicines, fever reducers, and cough suppressants. ?Do not use any products that contain nicotine or tobacco. These products include cigarettes, chewing tobacco, and vaping devices, such as e-cigarettes. If you need help quitting, ask your health care provider. ?Stay away from secondhand smoke. ?Stay up to date on all immunizations, including the yearly (annual) flu vaccine. ?Keep all follow-up visits. This is important. ?How to prevent the spread of infection to others ?URIs can be contagious. To prevent the infection from spreading: ?Wash your hands with soap and water for at least 20 seconds. If soap and water are not available, use hand sanitizer. ?Avoid touching your mouth,  face, eyes, or nose. ?Cough or sneeze into a tissue or your sleeve or elbow instead of into your hand or into the air. ? ?Contact a health care provider if: ?You are getting worse instead of better. ?You have a fever or chills. ?Your mucus is brown or red. ?You have  yellow or brown discharge coming from your nose. ?You have pain in your face, especially when you bend forward. ?You have swollen neck glands. ?You have pain while swallowing. ?You have white areas in the back of your throat. ?Get help right away if: ?You have shortness of breath that gets worse. ?You have severe or persistent: ?Headache. ?Ear pain. ?Sinus pain. ?Chest pain. ?You have chronic lung disease along with any of the following: ?Making high-pitched whistling sounds when you breathe, most often when you breathe out (wheezing). ?Prolonged cough (more than 14 days). ?Coughing up blood. ?A change in your usual mucus. ?You have a stiff neck. ?You have changes in your: ?Vision. ?Hearing. ?Thinking. ?Mood. ?These symptoms may be an emergency. Get help right away. Call 911. ?Do not wait to see if the symptoms will go away. ?Do not drive yourself to the hospital. ?Summary ?An upper respiratory infection (URI) is a common infection of the nose, throat, and upper air passages that lead to the lungs. ?A URI is caused by a virus. ?URIs usually get better on their own within 7-10 days. ?Medicines cannot cure URIs, but your health care provider may recommend certain medicines to help relieve symptoms. ?This information is not intended to replace advice given to you by your health care provider. Make sure you discuss any questions you have with your health care provider. ?Document Revised: 07/08/2021 Document Reviewed: 07/08/2021 ?Elsevier Patient Education ? [place]. ? ?

## 2022-02-19 NOTE — Progress Notes (Signed)
? ?Subjective:  ? ? Patient ID: Sandra Adams, female    DOB: 05-Jul-1966, 56 y.o.   MRN: [id number] ? ?Chief Complaint  ?Patient presents with  ? Follow-up  ?  Cough, uti sore throat was - for strep did have uti   ? ?Pt presents to the office today for Urgent Care follow up. She went to the Urgent Care on 02/15/22 for UTI symptoms, URI symptoms. She was given Keflex. Her urine culture was negative.   ?Nasal spray and pyridium. She reports her URI symptoms are the same.  ?URI  ?This is a new problem. The current episode started 1 to 4 weeks ago. The problem has been gradually worsening. There has been no fever. Associated symptoms include congestion, coughing, ear pain, headaches, rhinorrhea, sneezing and a sore throat. Pertinent negatives include no nausea or sinus pain. She has tried acetaminophen and increased fluids for the symptoms. The treatment provided mild relief.  ? ? ? ?Review of Systems  ?HENT:  Positive for congestion, ear pain, rhinorrhea, sneezing and sore throat. Negative for sinus pain.   ?Respiratory:  Positive for cough.   ?Gastrointestinal:  Negative for nausea.  ?Neurological:  Positive for headaches.  ?All other systems reviewed and are negative. ? ?   ?Objective:  ? Physical Exam ?Vitals reviewed.  ?Constitutional:   ?   General: She is not in acute distress. ?   Appearance: She is well-developed.  ?HENT:  ?   Head: Normocephalic and atraumatic.  ?   Right Ear: Tympanic membrane normal.  ?   Left Ear: Tympanic membrane normal.  ?   Nose:  ?   Right Turbinates: Pale.  ?   Left Turbinates: Pale.  ?Eyes:  ?   Pupils: Pupils are equal, round, and reactive to light.  ?Neck:  ?   Thyroid: No thyromegaly.  ?Cardiovascular:  ?   Rate and Rhythm: Normal rate and regular rhythm.  ?   Heart sounds: Normal heart sounds. No murmur heard. ?Pulmonary:  ?   Effort: Pulmonary effort is normal. No respiratory distress.  ?   Breath sounds: Normal breath sounds. No wheezing.  ?Abdominal:  ?   General: Bowel  sounds are normal. There is no distension.  ?   Palpations: Abdomen is soft.  ?   Tenderness: There is abdominal tenderness.  ?Musculoskeletal:     ?   General: No tenderness. Normal range of motion.  ?   Cervical back: Normal range of motion and neck supple.  ?Skin: ?   General: Skin is warm and dry.  ?Neurological:  ?   Mental Status: She is alert and oriented to person, place, and time.  ?   Cranial Nerves: No cranial nerve deficit.  ?   Deep Tendon Reflexes: Reflexes are normal and symmetric.  ?Psychiatric:     ?   Behavior: Behavior normal.     ?   Thought Content: Thought content normal.     ?   Judgment: Judgment normal.  ? ? ? ?BP 137/79   Pulse 99   Temp (!) 97.1 ?F (36.2 ?C) (Temporal)   Ht 5\' 2"  (1.575 m)   Wt 143 lb 12.8 oz (65.2 kg)   LMP 10/16/2014   BMI 26.30 kg/m?  ? ? ?   ?Assessment & Plan:  ? ?[name] comes in today with chief complaint of Follow-up (Cough, uti sore throat was - for strep did have uti ) ? ? ?Diagnosis and orders addressed: ? ?1. Acute  cystitis with hematuria ? ?- Urinalysis, Complete ?- Urine Culture ? ?2. Viral URI ?- Take meds as prescribed ?- Use a cool mist humidifier  ?-Use saline nose sprays frequently ?-Force fluids ?-For any cough or congestion ? Use plain Mucinex- regular strength or max strength is fine ?-For fever or aces or pains- take tylenol or ibuprofen. ?-Throat lozenges if help ?-Start zyrtec and will give cough medicine  ?- cetirizine (ZYRTEC ALLERGY) 10 MG tablet; Take 1 tablet (10 mg total) by mouth daily.  Dispense: 90 tablet; Refill: 2 ?- guaiFENesin-dextromethorphan (ROBITUSSIN DM) 100-10 MG/5ML syrup; Take 5 mLs by mouth every 4 (four) hours as needed for cough.  Dispense: 473 mL; Refill: 0 ? ?3. Allergic rhinitis due to pollen, unspecified seasonality ?- cetirizine (ZYRTEC ALLERGY) 10 MG tablet; Take 1 tablet (10 mg total) by mouth daily.  Dispense: 90 tablet; Refill: 2 ? ? ? ?Health Maintenance reviewed ?Diet and exercise  encouraged ? ?Follow up plan: ?As needed ? ?[name], FNP ? ? ?

## 2022-02-22 LAB — URINE CULTURE: Organism ID, Bacteria: NO GROWTH

## 2022-02-27 DIAGNOSIS — J309 Allergic rhinitis, unspecified: Secondary | ICD-10-CM | POA: Diagnosis not present

## 2022-02-27 DIAGNOSIS — K0889 Other specified disorders of teeth and supporting structures: Secondary | ICD-10-CM | POA: Diagnosis not present

## 2022-02-27 DIAGNOSIS — R0982 Postnasal drip: Secondary | ICD-10-CM | POA: Diagnosis not present

## 2022-03-10 ENCOUNTER — Ambulatory Visit (INDEPENDENT_AMBULATORY_CARE_PROVIDER_SITE_OTHER): Payer: Medicare Other | Admitting: Family Medicine

## 2022-03-10 ENCOUNTER — Encounter: Payer: Self-pay | Admitting: Family Medicine

## 2022-03-10 VITALS — BP 121/73 | HR 90 | Temp 97.8°F | Ht 62.0 in | Wt 148.1 lb

## 2022-03-10 DIAGNOSIS — H6593 Unspecified nonsuppurative otitis media, bilateral: Secondary | ICD-10-CM

## 2022-03-10 MED ORDER — NAPROXEN 250 MG PO TABS: 500.0000 mg | ORAL_TABLET | Freq: Two times a day (BID) | ORAL | 0 refills | Status: DC

## 2022-03-10 MED ORDER — DM-GUAIFENESIN ER 30-600 MG PO TB12: 2.0000 | ORAL_TABLET | Freq: Two times a day (BID) | ORAL | 0 refills | Status: DC

## 2022-03-10 NOTE — Progress Notes (Signed)
? ?Acute Office Visit ? ?Subjective:  ? ? Patient ID: Sandra Adams, female    DOB: 25-Jul-1966, 56 y.o.   MRN: [id number] ? ?Chief Complaint  ?Patient presents with  ? Ear Fullness  ? ? ?HPI ?Patient is in today for ear pain x 1 day. Her ears feel congestion and her hearing is muffled. She has been dealing with some postnasal drip and nasal congestion lately. She denies fever or drainage from her ears. She used some sweet oil with some improvement. She has recently been treat with keflex for a UTI and amoxicillin for a tooth infection. She has been taking zyrtec and flonase daily.  ? ?Past Medical History:  ?Diagnosis Date  ? Anxiety   ? Arthritis   ? Depression   ? Menopausal symptom   ? Migraine   ? Scoliosis   ? ? ?Past Surgical History:  ?Procedure Laterality Date  ? KNEE SURGERY Left 1974 and 1979  ? ? ?Family History  ?Problem Relation Age of Onset  ? Epilepsy Mother   ? COPD Father   ? Hyperlipidemia Father   ? Heart disease Father   ? Cancer Father   ? COPD Sister   ? Anxiety disorder Sister   ? COPD Maternal Aunt   ? Diabetes Maternal Uncle   ? Diabetes Maternal Aunt   ?     x2  ? Colon cancer Neg Hx   ? Colon polyps Neg Hx   ? Esophageal cancer Neg Hx   ? Gallbladder disease Neg Hx   ? ? ?Social History  ? ?Socioeconomic History  ? Marital status: Divorced  ?  Spouse name: Not on file  ? Number of children: 0  ? Years of education: 12th  ? Highest education level: High school graduate  ?Occupational History  ? Occupation: [employer]  ?  Comment: Back : [employer] K[employer] surgery   ?Tobacco Use  ? Smoking status: Never  ? Smokeless tobacco: Never  ?Vaping Use  ? Vaping Use: Never used  ?Substance and Sexual Activity  ? Alcohol use: No  ? Drug use: No  ? Sexual activity: Not Currently  ?  Partners: Male  ?  Birth control/protection: None  ?Other Topics Concern  ? Not on file  ?Social History Narrative  ? Single, no children. Right handed. 12 th grade. No caffeine.  ? Divorced since 2004.  ? ?Social Determinants  of Health  ? ?Financial Resource Strain: Low Risk   ? Difficulty of Paying Living Expenses: Not hard at all  ?Food Insecurity: No Food Insecurity  ? Worried About Charity fundraiser in the Last Year: Never true  ? Ran Out of Food in the Last Year: Never true  ?Transportation Needs: No Transportation Needs  ? Lack of Transportation (Medical): No  ? Lack of Transportation (Non-Medical): No  ?Physical Activity: Insufficiently Active  ? Days of Exercise per Week: 3 days  ? Minutes of Exercise per Session: 30 min  ?Stress: Stress Concern Present  ? Feeling of Stress : Rather much  ?Social Connections: Moderately Integrated  ? Frequency of Communication with Friends and Family: More than three times a week  ? Frequency of Social Gatherings with Friends and Family: More than three times a week  ? Attends Religious Services: More than 4 times per year  ? Active Member of Clubs or Organizations: Yes  ? Attends Archivist Meetings: More than 4 times per year  ? Marital Status: Divorced  ?Intimate Partner Violence: Not  At Risk  ? Fear of Current or Ex-Partner: No  ? Emotionally Abused: No  ? Physically Abused: No  ? Sexually Abused: No  ? ? ?Outpatient Medications Prior to Visit  ?Medication Sig Dispense Refill  ? ALPRAZolam (XANAX) 0.25 MG tablet Take 1 tablet (0.25 mg total) by mouth 2 (two) times daily as needed for anxiety. 45 tablet 2  ? cetirizine (ZYRTEC ALLERGY) 10 MG tablet Take 1 tablet (10 mg total) by mouth daily. 90 tablet 2  ? fluticasone (FLONASE) 50 MCG/ACT nasal spray Place 2 sprays into both nostrils daily. 16 g 6  ? cephALEXin (KEFLEX) 500 MG capsule Take 1,000 mg by mouth 2 (two) times daily.    ? guaiFENesin-dextromethorphan (ROBITUSSIN DM) 100-10 MG/5ML syrup Take 5 mLs by mouth every 4 (four) hours as needed for cough. 473 mL 0  ? ?No facility-administered medications prior to visit.  ? ? ?Allergies  ?Allergen Reactions  ? Ciprofloxacin   ?  Makes her feel weak,dizzy  ? Imitrex [Sumatriptan]    ?  Blurred vision ?  ? Prednisone Nausea And Vomiting  ? Tylenol [Acetaminophen]   ?  Tylenol #3,Headaches  ? Vicodin [Hydrocodone-Acetaminophen]   ?  GI upset  ? ? ?Review of Systems ?As per HPI.  ?   ?Objective:  ?  ?Physical Exam ?Vitals and nursing note reviewed.  ?Constitutional:   ?   General: She is not in acute distress. ?   Appearance: She is not ill-appearing, toxic-appearing or diaphoretic.  ?HENT:  ?   Head:  ?   Jaw: No trismus, tenderness or swelling.  ?   Right Ear: Ear canal and external ear normal. A middle ear effusion is present. Tympanic membrane is not injected, scarred, perforated, erythematous, retracted or bulging.  ?   Left Ear: Ear canal and external ear normal. A middle ear effusion is present. Tympanic membrane is not injected, scarred, perforated, erythematous, retracted or bulging.  ?   Mouth/Throat:  ?   Mouth: Mucous membranes are moist.  ?   Dentition: Dental caries present. No dental tenderness or dental abscesses.  ?Pulmonary:  ?   Effort: Pulmonary effort is normal. No respiratory distress.  ?Skin: ?   General: Skin is warm and dry.  ?Neurological:  ?   Mental Status: She is alert and oriented to person, place, and time.  ?Psychiatric:     ?   Mood and Affect: Mood normal.     ?   Behavior: Behavior normal.  ? ? ?BP 121/73   Pulse 90   Temp 97.8 ?F (36.6 ?C) (Temporal)   Ht 5' 2"  (1.575 m)   Wt 148 lb 2 oz (67.2 kg)   LMP 10/16/2014   BMI 27.09 kg/m?  ?Wt Readings from Last 3 Encounters:  ?03/10/22 148 lb 2 oz (67.2 kg)  ?02/19/22 143 lb 12.8 oz (65.2 kg)  ?12/07/21 141 lb (64 kg)  ? ? ?Health Maintenance Due  ?Topic Date Due  ? DEXA SCAN  05/10/2019  ? ? ?There are no preventive care reminders to display for this patient. ? ? ?Lab Results  ?Component Value Date  ? TSH 2.270 04/16/2021  ? ?Lab Results  ?Component Value Date  ? WBC 9.2 11/20/2021  ? HGB 14.2 11/20/2021  ? HCT 42.2 11/20/2021  ? MCV 96 11/20/2021  ? PLT 220 11/20/2021  ? ?Lab Results  ?Component Value Date  ?  NA 143 11/20/2021  ? K 4.1 11/20/2021  ? CO2 25 11/20/2021  ?  GLUCOSE 117 (H) 11/20/2021  ? BUN 10 11/20/2021  ? CREATININE 0.81 11/20/2021  ? BILITOT 0.5 11/20/2021  ? ALKPHOS 125 (H) 11/20/2021  ? AST 23 11/20/2021  ? ALT 26 11/20/2021  ? PROT 6.9 11/20/2021  ? ALBUMIN 4.5 11/20/2021  ? CALCIUM 9.7 11/20/2021  ? EGFR 85 11/20/2021  ? ?Lab Results  ?Component Value Date  ? CHOL 209 (H) 04/16/2021  ? ?Lab Results  ?Component Value Date  ? HDL 53 04/16/2021  ? ?Lab Results  ?Component Value Date  ? LDLCALC 125 (H) 04/16/2021  ? ?Lab Results  ?Component Value Date  ? TRIG 175 (H) 04/16/2021  ? ?Lab Results  ?Component Value Date  ? CHOLHDL 3.9 04/16/2021  ? ?No results found for: HGBA1C ? ?   ?Assessment & Plan:  ? ?Antanasia was seen today for ear fullness. ? ?Diagnoses and all orders for this visit: ? ?Fluid level behind tympanic membrane of both ears ?No infection currently. Mucinex DM and naproxen ordered. Do not take other NSAIDs with naproxen. Continue flonase and zyrtec daily.  ?-     dextromethorphan-guaiFENesin (MUCINEX DM) 30-600 MG 12hr tablet; Take 2 tablets by mouth 2 (two) times daily. ?-     naproxen (NAPROSYN) 250 MG tablet; Take 2 tablets (500 mg total) by mouth 2 (two) times daily with a meal. ? ?Return to office for new or worsening symptoms, or if symptoms persist.  ? ?The patient indicates understanding of these issues and agrees with the plan. ? ?Gwenlyn Perking, FNP ? ?

## 2022-03-10 NOTE — Patient Instructions (Signed)
Otitis Media With Effusion, Adult ?Otitis media with effusion (OME) is inflammation and fluid (effusion) in the middle ear without having an ear infection. The middle ear is the space behind the eardrum. The middle ear is connected to the back of the throat by a narrow tube (eustachian tube). Normally the eustachian tube drains fluid out of the middle ear. A swollen eustachian tube can become blocked and cause fluid to collect in the middle ear. ?OME often goes away without treatment. Sometimes OME can lead to hearing problems and recurrent acute ear infections (acute otitis media). These conditions may require treatment. ?What are the causes? ?OME is caused by a blocked eustachian tube. This can result from: ?Allergies. ?Upper respiratory infections. ?Enlarged adenoids. The adenoids are areas of soft tissue located high in the back of the throat, behind the nose and the roof of the mouth. They are part of the body's natural defense system (immune system). ?Rapid changes in pressure, like when an airplane is descending or during scuba diving. ?In some cases, the cause of this condition is not known. ?What are the signs or symptoms? ?Common symptoms of this condition include: ?A feeling of fullness in your ear. ?Decreased hearing in the affected ear. ?Fluid draining into the ear canal. ?Pain in the ear. ?In some cases, there are no symptoms. ?How is this diagnosed? ?A health care provider can diagnose OME based on signs and symptoms of the condition. Your provider will also do a physical exam to check for fluid behind the eardrum. During the exam, your health care provider will use an instrument called an otoscope to look in your ear. ?Your health care provider may do other tests, such as: ?A hearing test. ?A tympanogram. This is a test that shows how well the eardrum moves in response to air pressure in the ear canal. It provides a graph for your health care provider to review. ?A pneumatic otoscopy. This is a test  to check how your eardrum moves in response to changes in pressure. It is done by squeezing a small amount of air into the ear. ?How is this treated? ?Treatment for OME depends on the cause of the condition and the severity of symptoms. The first step is often waiting to see if the fluid drains on its own in a few weeks. Home care treatment may include: ?Over-the-counter pain relievers. ?A warm, moist cloth placed over the ear. ?Severe cases may require a procedure to insert tubes in the ears (tympanostomy tubes) to drain the fluid. ?Follow these instructions at home: ?Take over-the-counter and prescription medicines only as told by your health care provider. ?Keep all follow-up visits. ?Contact a health care provider if: ?You have pain that gets worse. ?Hearing in your affected ear gets worse. ?You have fluid draining from your ear canal. ?You have dizziness. ?You develop a fever. ?Get help right away if: ?You develop a severe headache. ?You completely lose hearing in the affected ear. ?You have bleeding from your ear canal. ?You have sudden and severe pain in your ear. ?These symptoms may represent a serious problem that is an emergency. Do not wait to see if the symptoms will go away. Get medical help right away. Call your local emergency services (911 in the [place] Do not drive yourself to the hospital. ?Summary ?Otitis media with effusion (OME) is inflammation and fluid (effusion) in the middle ear without having an ear infection. ?A swollen eustachian tube can become blocked and cause fluid to collect in the middle ear. ?  Treatment for OME depends on the cause of the condition and the severity of symptoms. ?Many times, treatment is not needed because the fluid drains on its own in a few weeks. ?Sometimes OME can lead to hearing problems and recurrent acute ear infections (acute otitis media), which may require treatment. ?This information is not intended to replace advice given to you by your health care  provider. Make sure you discuss any questions you have with your health care provider. ?Document Revised: 04/02/2021 Document Reviewed: 04/02/2021 ?Elsevier Patient Education ? [place]. ? ?

## 2022-03-11 ENCOUNTER — Other Ambulatory Visit: Payer: Self-pay | Admitting: Family

## 2022-03-11 DIAGNOSIS — F411 Generalized anxiety disorder: Secondary | ICD-10-CM

## 2022-03-11 DIAGNOSIS — H6980 Other specified disorders of Eustachian tube, unspecified ear: Secondary | ICD-10-CM | POA: Diagnosis not present

## 2022-03-11 DIAGNOSIS — F331 Major depressive disorder, recurrent, moderate: Secondary | ICD-10-CM

## 2022-03-16 ENCOUNTER — Encounter: Payer: Self-pay | Admitting: Family

## 2022-03-16 ENCOUNTER — Ambulatory Visit (INDEPENDENT_AMBULATORY_CARE_PROVIDER_SITE_OTHER): Payer: Medicare Other | Admitting: Family

## 2022-03-16 VITALS — BP 118/67 | HR 87 | Temp 97.8°F | Ht 62.0 in | Wt 141.4 lb

## 2022-03-16 DIAGNOSIS — H6982 Other specified disorders of Eustachian tube, left ear: Secondary | ICD-10-CM

## 2022-03-16 DIAGNOSIS — J301 Allergic rhinitis due to pollen: Secondary | ICD-10-CM | POA: Diagnosis not present

## 2022-03-16 MED ORDER — CETIRIZINE HCL 10 MG PO TABS: 10.0000 mg | ORAL_TABLET | Freq: Every day | ORAL | 2 refills | Status: DC

## 2022-03-16 MED ORDER — FLUTICASONE PROPIONATE 50 MCG/ACT NA SUSP: 2.0000 | Freq: Every day | NASAL | 6 refills | Status: DC

## 2022-03-16 NOTE — Patient Instructions (Signed)
Eustachian Tube Dysfunction °Eustachian tube dysfunction refers to a condition in which a blockage develops in the narrow passage that connects the middle ear to the back of the nose (eustachian tube). The eustachian tube regulates air pressure in the middle ear by letting air move between the ear and nose. It also helps to drain fluid from the middle ear space. °Eustachian tube dysfunction can affect one or both ears. When the eustachian tube does not function properly, air pressure, fluid, or both can build up in the middle ear. °What are the causes? °This condition occurs when the eustachian tube becomes blocked or cannot open normally. Common causes of this condition include: °Ear infections. °Colds and other infections that affect the nose, mouth, and throat (upper respiratory tract). °Allergies. °Irritation from cigarette smoke. °Irritation from stomach acid coming up into the esophagus (gastroesophageal reflux). The esophagus is the part of the body that moves food from the mouth to the stomach. °Sudden changes in air pressure, such as from descending in an airplane or scuba diving. °Abnormal growths in the nose or throat, such as: °Growths that line the nose (nasal polyps). °Abnormal growth of cells (tumors). °Enlarged tissue at the back of the throat (adenoids). °What increases the risk? °You are more likely to develop this condition if: °You smoke. °You are overweight. °You are a child who has: °Certain birth defects of the mouth, such as cleft palate. °Large tonsils or adenoids. °What are the signs or symptoms? °Common symptoms of this condition include: °A feeling of fullness in the ear. °Ear pain. °Clicking or popping noises in the ear. °Ringing in the ear (tinnitus). °Hearing loss. °Loss of balance. °Dizziness. °Symptoms may get worse when the air pressure around you changes, such as when you travel to an area of high elevation, fly on an airplane, or go scuba diving. °How is this diagnosed? °This  condition may be diagnosed based on: °Your symptoms. °A physical exam of your ears, nose, and throat. °Tests, such as those that measure: °The movement of your eardrum. °Your hearing (audiometry). °How is this treated? °Treatment depends on the cause and severity of your condition. °In mild cases, you may relieve your symptoms by moving air into your ears. This is called "popping the ears." °In more severe cases, or if you have symptoms of fluid in your ears, treatment may include: °Medicines to relieve congestion (decongestants). °Medicines that treat allergies (antihistamines). °Nasal sprays or ear drops that contain medicines that reduce swelling (steroids). °A procedure to drain the fluid in your eardrum. In this procedure, a small tube may be placed in the eardrum to: °Drain the fluid. °Restore the air in the middle ear space. °A procedure to insert a balloon device through the nose to inflate the opening of the eustachian tube (balloon dilation). °Follow these instructions at home: °Lifestyle °Do not do any of the following until your health care provider approves: °Travel to high altitudes. °Fly in airplanes. °Work in a pressurized cabin or room. °Scuba dive. °Do not use any products that contain nicotine or tobacco. These products include cigarettes, chewing tobacco, and vaping devices, such as e-cigarettes. If you need help quitting, ask your health care provider. °Keep your ears dry. Wear fitted earplugs during showering and bathing. Dry your ears completely after. °General instructions °Take over-the-counter and prescription medicines only as told by your health care provider. °Use techniques to help pop your ears as recommended by your health care provider. These may include: °Chewing gum. °Yawning. °Frequent, forceful swallowing. °Closing   your mouth, holding your nose closed, and gently blowing as if you are trying to blow air out of your nose. °Keep all follow-up visits. This is important. °Contact a  health care provider if: °Your symptoms do not go away after treatment. °Your symptoms come back after treatment. °You are unable to pop your ears. °You have: °A fever. °Pain in your ear. °Pain in your head or neck. °Fluid draining from your ear. °Your hearing suddenly changes. °You become very dizzy. °You lose your balance. °Get help right away if: °You have a sudden, severe increase in any of your symptoms. °Summary °Eustachian tube dysfunction refers to a condition in which a blockage develops in the eustachian tube. °It can be caused by ear infections, allergies, inhaled irritants, or abnormal growths in the nose or throat. °Symptoms may include ear pain or fullness, hearing loss, or ringing in the ears. °Mild cases are treated with techniques to unblock the ears, such as yawning or chewing gum. °More severe cases are treated with medicines or procedures. °This information is not intended to replace advice given to you by your health care provider. Make sure you discuss any questions you have with your health care provider. °Document Revised: 02/16/2021 Document Reviewed: 02/16/2021 °Elsevier Patient Education © 2022 [employer] ° °

## 2022-03-16 NOTE — Progress Notes (Signed)
? ?Subjective:  ? ? Patient ID: Sandra Adams, female    DOB: 1966-01-09, 56 y.o.   MRN: [id number] ? ?Chief Complaint  ?Patient presents with  ? Ear Fullness  ?  Left   ? ?Pt presents to the office today with left ear fullness. This has been on going for the last week. She was seen in our office on 03/10/22 and given naprosyn and then seen at the Urgent Care on 03/11/22 and told to take decongestant and  flonase without relief.  ?Ear Fullness  ?There is pain in the left ear. The current episode started 1 to 4 weeks ago. The problem occurs constantly. The problem has been gradually worsening. There has been no fever. The patient is experiencing no pain. Associated symptoms include hearing loss. Pertinent negatives include no coughing, ear discharge, headaches, neck pain, rhinorrhea or sore throat. She has tried NSAIDs and ear drops for the symptoms. The treatment provided mild relief.  ? ? ? ?Review of Systems  ?HENT:  Positive for hearing loss. Negative for ear discharge, rhinorrhea and sore throat.   ?Respiratory:  Negative for cough.   ?Musculoskeletal:  Negative for neck pain.  ?Neurological:  Negative for headaches.  ?All other systems reviewed and are negative. ? ?   ?Objective:  ? Physical Exam ?Vitals reviewed.  ?Constitutional:   ?   General: She is not in acute distress. ?   Appearance: She is well-developed.  ?HENT:  ?   Head: Normocephalic and atraumatic.  ?   Right Ear: Tympanic membrane normal.  ?   Left Ear: Tympanic membrane normal.  ?   Mouth/Throat:  ?   Pharynx: Posterior oropharyngeal erythema present.  ?Eyes:  ?   Pupils: Pupils are equal, round, and reactive to light.  ?Neck:  ?   Thyroid: No thyromegaly.  ?Cardiovascular:  ?   Rate and Rhythm: Normal rate and regular rhythm.  ?   Heart sounds: Normal heart sounds. No murmur heard. ?Pulmonary:  ?   Effort: Pulmonary effort is normal. No respiratory distress.  ?   Breath sounds: Normal breath sounds. No wheezing.  ?Abdominal:  ?   General:  Bowel sounds are normal. There is no distension.  ?   Palpations: Abdomen is soft.  ?   Tenderness: There is no abdominal tenderness.  ?Musculoskeletal:     ?   General: No tenderness. Normal range of motion.  ?   Cervical back: Normal range of motion and neck supple.  ?Skin: ?   General: Skin is warm and dry.  ?Neurological:  ?   Mental Status: She is alert and oriented to person, place, and time.  ?   Cranial Nerves: No cranial nerve deficit.  ?   Deep Tendon Reflexes: Reflexes are normal and symmetric.  ?Psychiatric:     ?   Behavior: Behavior normal.     ?   Thought Content: Thought content normal.     ?   Judgment: Judgment normal.  ? ? ? ?BP 118/67   Pulse 87   Temp 97.8 ?F (36.6 ?C) (Temporal)   Ht '5\' 2"'$  (1.575 m)   Wt 141 lb 6.4 oz (64.1 kg)   LMP 10/16/2014   BMI 25.86 kg/m?  ? ? ?   ?Assessment & Plan:  ?CORSICA FRANSON comes in today with chief complaint of Ear Fullness (Left ) ? ? ?Diagnosis and orders addressed: ? ?1. Eustachian tube dysfunction, left ?- Take meds as prescribed ?- Use a cool mist  humidifier  ?-Use saline nose sprays frequently ?-Force fluids ?-For any cough or congestion ? Use plain Mucinex- regular strength or max strength is fine ?-For fever or aces or pains- take tylenol or ibuprofen. ?-Throat lozenges if help ?-New toothbrush in 3 days ?- cetirizine (ZYRTEC ALLERGY) 10 MG tablet; Take 1 tablet (10 mg total) by mouth daily.  Dispense: 90 tablet; Refill: 2 ?- fluticasone (FLONASE) 50 MCG/ACT nasal spray; Place 2 sprays into both nostrils daily.  Dispense: 16 g; Refill: 6 ? ?2. Allergic rhinitis due to pollen, unspecified seasonality ?- cetirizine (ZYRTEC ALLERGY) 10 MG tablet; Take 1 tablet (10 mg total) by mouth daily.  Dispense: 90 tablet; Refill: 2 ? ? ? ? ?[name], FNP ? ? ? ?

## 2022-03-23 ENCOUNTER — Other Ambulatory Visit: Payer: Medicare Other

## 2022-03-24 ENCOUNTER — Telehealth: Payer: Medicare Other

## 2022-03-24 ENCOUNTER — Telehealth: Payer: Self-pay | Admitting: Family

## 2022-03-24 NOTE — Telephone Encounter (Signed)
Called patient - line disconnected - pt okay to do DXA on 5/1 when she comes in for ov ?

## 2022-03-25 ENCOUNTER — Ambulatory Visit (INDEPENDENT_AMBULATORY_CARE_PROVIDER_SITE_OTHER): Payer: Medicare Other | Admitting: Family Medicine

## 2022-03-25 ENCOUNTER — Encounter: Payer: Self-pay | Admitting: Family Medicine

## 2022-03-25 VITALS — BP 132/87 | HR 83 | Ht 62.0 in | Wt 141.0 lb

## 2022-03-25 DIAGNOSIS — R399 Unspecified symptoms and signs involving the genitourinary system: Secondary | ICD-10-CM

## 2022-03-25 LAB — URINALYSIS
Bilirubin, UA: NEGATIVE
Glucose, UA: NEGATIVE
Ketones, UA: NEGATIVE
Nitrite, UA: NEGATIVE
Protein,UA: NEGATIVE
Specific Gravity, UA: >1.03 — ABNORMAL HIGH (ref 1.005–1.030)
Urobilinogen, Ur: 0.2 mg/dL (ref 0.2–1.0)
pH, UA: 5 (ref 5.0–7.5)

## 2022-03-25 MED ORDER — PREDNISONE 20 MG PO TABS: 40.0000 mg | ORAL_TABLET | Freq: Every day | ORAL | 0 refills | Status: AC

## 2022-03-25 NOTE — Progress Notes (Signed)
? ?BP 132/87   Pulse 83   Ht '5\' 2"'$  (1.575 m)   Wt 141 lb (64 kg)   LMP 10/16/2014   SpO2 100%   BMI 25.79 kg/m?   ? ?Subjective:  ? ?Patient ID: Sandra Adams, female    DOB: [birth year], 56 y.o.   MRN: [id number] ? ?HPI: ?Sandra Adams is a 56 y.o. female presenting on 03/25/2022 for Urinary Tract Infection (Positive AZO test at home. Denies pain, bleeding, frequency) ? ? ?HPI ?Patient comes in saying that she took a positive Azo test that showed possible UTI and that is why she is coming in.  When asked about whether she had any symptoms.  She denies any frequency or dysuria or urgency.  She denies any fevers or chills or abdominal pain or flank pain.  She just "felt 1 coming on". ? ?Relevant past medical, surgical, family and social history reviewed and updated as indicated. Interim medical history since our last visit reviewed. ?Allergies and medications reviewed and updated. ? ?Review of Systems  ?Constitutional:  Negative for chills and fever.  ?HENT:  Positive for congestion and ear pain. Negative for ear discharge.   ?Eyes:  Negative for redness and visual disturbance.  ?Respiratory:  Positive for cough. Negative for chest tightness and shortness of breath.   ?Cardiovascular:  Negative for chest pain and leg swelling.  ?Gastrointestinal:  Negative for abdominal pain.  ?Genitourinary:  Negative for decreased urine volume, difficulty urinating, dysuria, flank pain, frequency, hematuria, urgency, vaginal bleeding, vaginal discharge and vaginal pain.  ?Musculoskeletal:  Negative for back pain and gait problem.  ?Skin:  Negative for rash.  ?Neurological:  Negative for light-headedness and headaches.  ?Psychiatric/Behavioral:  Negative for agitation and behavioral problems.   ?All other systems reviewed and are negative. ? ?Per HPI unless specifically indicated above ? ? ?Allergies as of 03/25/2022   ? ?   Reactions  ? Ciprofloxacin   ? Makes her feel weak,dizzy  ? Imitrex [sumatriptan]   ? Blurred vision  ?  Prednisone Nausea And Vomiting  ? Tylenol [acetaminophen]   ? Tylenol #3,Headaches  ? Vicodin [hydrocodone-acetaminophen]   ? GI upset  ? ?  ? ?  ?Medication List  ?  ? ?  ? Accurate as of March 25, 2022  1:43 PM. If you have any questions, ask your nurse or doctor.  ?  ?  ? ?  ? ?ALPRAZolam 0.25 MG tablet ?Commonly known as: Duanne Moron ?Take 1 tablet (0.25 mg total) by mouth 2 (two) times daily as needed for anxiety. ?  ?cetirizine 10 MG tablet ?Commonly known as: ZyrTEC Allergy ?Take 1 tablet (10 mg total) by mouth daily. ?  ?dextromethorphan-guaiFENesin 30-600 MG 12hr tablet ?Commonly known as: Home DM ?Take 2 tablets by mouth 2 (two) times daily. ?  ?fluticasone 50 MCG/ACT nasal spray ?Commonly known as: FLONASE ?Place 2 sprays into both nostrils daily. ?  ?naproxen 250 MG tablet ?Commonly known as: NAPROSYN ?Take 2 tablets (500 mg total) by mouth 2 (two) times daily with a meal. ?  ?predniSONE 20 MG tablet ?Commonly known as: DELTASONE ?Take 2 tablets (40 mg total) by mouth daily with breakfast for 5 days. ?  ? ?  ? ? ? ?Objective:  ? ?BP 132/87   Pulse 83   Ht '5\' 2"'$  (1.575 m)   Wt 141 lb (64 kg)   LMP 10/16/2014   SpO2 100%   BMI 25.79 kg/m?   ?Wt Readings from Last 3 Encounters:  ?  03/25/22 141 lb (64 kg)  ?03/16/22 141 lb 6.4 oz (64.1 kg)  ?03/10/22 148 lb 2 oz (67.2 kg)  ?  ?Physical Exam ?Vitals and nursing note reviewed.  ?Constitutional:   ?   General: She is not in acute distress. ?   Appearance: She is well-developed. She is not diaphoretic.  ?Eyes:  ?   Conjunctiva/sclera: Conjunctivae normal.  ?   Pupils: Pupils are equal, round, and reactive to light.  ?Cardiovascular:  ?   Rate and Rhythm: Normal rate and regular rhythm.  ?   Heart sounds: Normal heart sounds. No murmur heard. ?Pulmonary:  ?   Effort: Pulmonary effort is normal. No respiratory distress.  ?   Breath sounds: Normal breath sounds. No wheezing.  ?Musculoskeletal:     ?   General: No swelling or tenderness. Normal range of motion.   ?Skin: ?   General: Skin is warm and dry.  ?   Findings: No rash.  ?Neurological:  ?   Mental Status: She is alert and oriented to person, place, and time.  ?   Coordination: Coordination normal.  ?Psychiatric:     ?   Behavior: Behavior normal.  ? ? ?Urinalysis: Trace leukocytes, otherwise negative, recommended hydration ? ?Assessment & Plan:  ? ?Problem List Items Addressed This Visit   ?None ?Visit Diagnoses   ? ? UTI symptoms    -  Primary  ? Relevant Orders  ? Urinalysis  ? Urine Culture  ? ?  ?  ?Urine looks clean, recommended hydration. ? ?Patient says she has a little bit of sinus congestion but already being treated for that. ?Follow up plan: ?Return if symptoms worsen or fail to improve. ? ?Counseling provided for all of the vaccine components ?Orders Placed This Encounter  ?Procedures  ? Urine Culture  ? Urinalysis  ? ? ?Caryl Pina, MD ?[place] ?03/25/2022, 1:43 PM ? ? ? ? ?

## 2022-03-25 NOTE — Telephone Encounter (Signed)
Prednisone 40 mg Prescription sent to pharmacy  ? ?

## 2022-03-25 NOTE — Telephone Encounter (Signed)
Pt aware and scheduled for uti f/u ?

## 2022-03-27 LAB — URINE CULTURE: Organism ID, Bacteria: NO GROWTH

## 2022-04-08 ENCOUNTER — Other Ambulatory Visit: Payer: Self-pay | Admitting: Family

## 2022-04-08 DIAGNOSIS — E663 Overweight: Secondary | ICD-10-CM

## 2022-04-08 DIAGNOSIS — F411 Generalized anxiety disorder: Secondary | ICD-10-CM

## 2022-04-08 DIAGNOSIS — K0889 Other specified disorders of teeth and supporting structures: Secondary | ICD-10-CM

## 2022-04-08 DIAGNOSIS — Z79899 Other long term (current) drug therapy: Secondary | ICD-10-CM

## 2022-04-09 ENCOUNTER — Ambulatory Visit: Payer: Medicare Other | Admitting: Family Medicine

## 2022-04-09 DIAGNOSIS — M549 Dorsalgia, unspecified: Secondary | ICD-10-CM | POA: Diagnosis not present

## 2022-04-09 DIAGNOSIS — N3091 Cystitis, unspecified with hematuria: Secondary | ICD-10-CM | POA: Diagnosis not present

## 2022-04-16 DIAGNOSIS — K0889 Other specified disorders of teeth and supporting structures: Secondary | ICD-10-CM | POA: Diagnosis not present

## 2022-04-16 DIAGNOSIS — S025XXB Fracture of tooth (traumatic), initial encounter for open fracture: Secondary | ICD-10-CM | POA: Diagnosis not present

## 2022-04-19 ENCOUNTER — Other Ambulatory Visit: Payer: Medicare Other

## 2022-04-19 ENCOUNTER — Ambulatory Visit: Payer: Medicare Other | Admitting: Family

## 2022-04-26 ENCOUNTER — Ambulatory Visit (INDEPENDENT_AMBULATORY_CARE_PROVIDER_SITE_OTHER): Payer: Medicare Other | Admitting: Family Medicine

## 2022-04-26 ENCOUNTER — Encounter: Payer: Self-pay | Admitting: Family Medicine

## 2022-04-26 DIAGNOSIS — J069 Acute upper respiratory infection, unspecified: Secondary | ICD-10-CM

## 2022-04-26 MED ORDER — BENZONATATE 100 MG PO CAPS: 100.0000 mg | ORAL_CAPSULE | Freq: Three times a day (TID) | ORAL | 0 refills | Status: DC | PRN

## 2022-04-26 NOTE — Progress Notes (Signed)
? ?  Virtual Visit  Note ?Due to COVID-19 pandemic this visit was conducted virtually. This visit type was conducted due to national recommendations for restrictions regarding the COVID-19 Pandemic (e.g. social distancing, sheltering in place) in an effort to limit this patient's exposure and mitigate transmission in our community. All issues noted in this document were discussed and addressed.  A physical exam was not performed with this format. ? ?I connected with [name] on 04/26/22 at 1302 by telephone and verified that I am speaking with the correct person using two identifiers. [name] is currently located at [employer] and her husband is currently with her during the visit. The provider, Gwenlyn Perking, FNP is located in their office at time of visit. ? ?I discussed the limitations, risks, security and privacy concerns of performing an evaluation and management service by telephone and the availability of in person appointments. I also discussed with the patient that there may be a patient responsible charge related to this service. The patient expressed understanding and agreed to proceed. ? ?CC: congestion ? ?History and Present Illness: ? ?HPI ?Mayvis reports a cough and congestion x 4 days. She also reports ear fullness. She denies sore throat, fever, body aches, chills, shortness of breath, or chest pain. She started taking zyrtec D yesterday and this has been helpful.  ? ? ? ?ROS ?As per HPI.  ? ?Observations/Objective: ?Alert and oriented x 3. Able to speak in full sentences without difficulty.  ? ?Assessment and Plan: ?Allante was seen today for nasal congestion. ? ?Diagnoses and all orders for this visit: ? ?Viral URI ?Continue Zyrtec D. Tessalon perles as below. Nasal saline, hydration. Return to office for new or worsening symptoms, or if symptoms persist.  ?-     benzonatate (TESSALON PERLES) 100 MG capsule; Take 1 capsule (100 mg total) by mouth 3 (three) times daily as  needed for cough. ? ? ? ? ?Follow Up Instructions: ?As needed.  ? ?  ?I discussed the assessment and treatment plan with the patient. The patient was provided an opportunity to ask questions and all were answered. The patient agreed with the plan and demonstrated an understanding of the instructions. ?  ?The patient was advised to call back or seek an in-person evaluation if the symptoms worsen or if the condition fails to improve as anticipated. ? ?The above assessment and management plan was discussed with the patient. The patient verbalized understanding of and has agreed to the management plan. Patient is aware to call the clinic if symptoms persist or worsen. Patient is aware when to return to the clinic for a follow-up visit. Patient educated on when it is appropriate to go to the emergency department.  ? ?Time call ended:  1314 ? ?I provided 12 minutes of  non face-to-face time during this encounter. ? ? ? ?Gwenlyn Perking, FNP ? ? ?

## 2022-05-03 ENCOUNTER — Encounter: Payer: Self-pay | Admitting: Family

## 2022-05-03 ENCOUNTER — Encounter: Payer: Medicare Other | Admitting: Family

## 2022-05-18 ENCOUNTER — Other Ambulatory Visit: Payer: Self-pay | Admitting: Family

## 2022-05-18 DIAGNOSIS — E663 Overweight: Secondary | ICD-10-CM

## 2022-05-18 DIAGNOSIS — Z79899 Other long term (current) drug therapy: Secondary | ICD-10-CM

## 2022-05-18 DIAGNOSIS — F411 Generalized anxiety disorder: Secondary | ICD-10-CM

## 2022-05-20 ENCOUNTER — Ambulatory Visit (INDEPENDENT_AMBULATORY_CARE_PROVIDER_SITE_OTHER): Payer: Medicare Other

## 2022-05-20 ENCOUNTER — Encounter: Payer: Self-pay | Admitting: Family

## 2022-05-20 ENCOUNTER — Ambulatory Visit (INDEPENDENT_AMBULATORY_CARE_PROVIDER_SITE_OTHER): Payer: Medicare Other | Admitting: Family

## 2022-05-20 VITALS — BP 133/78 | HR 91 | Temp 98.6°F | Ht 62.0 in | Wt 142.0 lb

## 2022-05-20 DIAGNOSIS — Z0001 Encounter for general adult medical examination with abnormal findings: Secondary | ICD-10-CM

## 2022-05-20 DIAGNOSIS — Z0000 Encounter for general adult medical examination without abnormal findings: Secondary | ICD-10-CM

## 2022-05-20 DIAGNOSIS — M8589 Other specified disorders of bone density and structure, multiple sites: Secondary | ICD-10-CM | POA: Diagnosis not present

## 2022-05-20 DIAGNOSIS — E663 Overweight: Secondary | ICD-10-CM

## 2022-05-20 DIAGNOSIS — Z780 Asymptomatic menopausal state: Secondary | ICD-10-CM

## 2022-05-20 DIAGNOSIS — F411 Generalized anxiety disorder: Secondary | ICD-10-CM

## 2022-05-20 DIAGNOSIS — Z79899 Other long term (current) drug therapy: Secondary | ICD-10-CM

## 2022-05-20 DIAGNOSIS — R300 Dysuria: Secondary | ICD-10-CM

## 2022-05-20 DIAGNOSIS — M159 Polyosteoarthritis, unspecified: Secondary | ICD-10-CM

## 2022-05-20 DIAGNOSIS — G43009 Migraine without aura, not intractable, without status migrainosus: Secondary | ICD-10-CM | POA: Diagnosis not present

## 2022-05-20 DIAGNOSIS — F331 Major depressive disorder, recurrent, moderate: Secondary | ICD-10-CM

## 2022-05-20 DIAGNOSIS — K219 Gastro-esophageal reflux disease without esophagitis: Secondary | ICD-10-CM

## 2022-05-20 LAB — URINALYSIS, COMPLETE
Bilirubin, UA: NEGATIVE
Glucose, UA: NEGATIVE
Ketones, UA: NEGATIVE
Nitrite, UA: NEGATIVE
Protein,UA: NEGATIVE
RBC, UA: NEGATIVE
Specific Gravity, UA: 1.025 (ref 1.005–1.030)
Urobilinogen, Ur: 0.2 mg/dL (ref 0.2–1.0)
pH, UA: 6 (ref 5.0–7.5)

## 2022-05-20 LAB — MICROSCOPIC EXAMINATION
RBC, Urine: NONE SEEN /hpf (ref 0–2)
Renal Epithel, UA: NONE SEEN /hpf

## 2022-05-20 MED ORDER — ALPRAZOLAM 0.25 MG PO TABS: 0.2500 mg | ORAL_TABLET | Freq: Two times a day (BID) | ORAL | 2 refills | Status: DC | PRN

## 2022-05-20 NOTE — Progress Notes (Signed)
Subjective:    Patient ID: Sandra Adams, female    DOB: 1966/11/12, 56 y.o.   MRN: [id number]  Chief Complaint  Patient presents with   Annual Exam   Vaginal Pain    Burning - 4 days now   Pt presents to the office today for CPE without pap.  Vaginal Pain The patient's pertinent negatives include no genital itching or vaginal discharge. Associated symptoms include discolored urine and urgency.  Migraine  This is a chronic problem. The current episode started more than 1 year ago. The problem occurs intermittently. The problem has been waxing and waning. The pain is located in the Right unilateral region. The pain does not radiate. The quality of the pain is described as aching. The pain is mild. Associated symptoms include dizziness. Pertinent negatives include no phonophobia or photophobia. The symptoms are aggravated by fatigue and medications. She has tried NSAIDs for the symptoms. The treatment provided moderate relief.  Gastroesophageal Reflux She complains of belching and heartburn. This is a chronic problem. The current episode started more than 1 year ago. The problem occurs occasionally. Associated symptoms include fatigue. She has tried a PPI for the symptoms. The treatment provided moderate relief.  Arthritis Presents for follow-up visit. She complains of pain and stiffness. The symptoms have been stable. Affected locations include the left knee, right knee, left MCP, right MCP, left hip and right hip. Her pain is at a severity of 2/10. Associated symptoms include fatigue.  Anxiety Presents for follow-up visit. Symptoms include dizziness, excessive worry, irritability, nervous/anxious behavior and restlessness. Patient reports no suicidal ideas. Symptoms occur most days. The severity of symptoms is moderate. Nighttime awakenings: early a.m. for rest of night.    Depression        This is a chronic problem.  The current episode started more than 1 year ago.   Associated  symptoms include fatigue, helplessness, hopelessness, irritable, restlessness and sad.  Associated symptoms include no suicidal ideas.  Past treatments include nothing.  Past medical history includes anxiety.      Review of Systems  Constitutional:  Positive for fatigue and irritability.  Eyes:  Negative for photophobia.  Gastrointestinal:  Positive for heartburn.  Genitourinary:  Positive for urgency and vaginal pain. Negative for vaginal discharge.  Musculoskeletal:  Positive for arthritis and stiffness.  Neurological:  Positive for dizziness.  Psychiatric/Behavioral:  Positive for depression. Negative for suicidal ideas. The patient is nervous/anxious.   All other systems reviewed and are negative.     Objective:   Physical Exam Vitals reviewed.  Constitutional:      General: She is irritable. She is not in acute distress.    Appearance: She is well-developed.  HENT:     Head: Normocephalic and atraumatic.     Right Ear: Tympanic membrane normal.     Left Ear: Tympanic membrane normal.  Eyes:     Pupils: Pupils are equal, round, and reactive to light.  Neck:     Thyroid: No thyromegaly.  Cardiovascular:     Rate and Rhythm: Normal rate and regular rhythm.     Heart sounds: Normal heart sounds. No murmur heard. Pulmonary:     Effort: Pulmonary effort is normal. No respiratory distress.     Breath sounds: Normal breath sounds. No wheezing.  Abdominal:     General: Bowel sounds are normal. There is no distension.     Palpations: Abdomen is soft.     Tenderness: There is no abdominal tenderness.  Musculoskeletal:  General: No tenderness. Normal range of motion.     Cervical back: Normal range of motion and neck supple.  Skin:    General: Skin is warm and dry.  Neurological:     Mental Status: She is alert and oriented to person, place, and time.     Cranial Nerves: No cranial nerve deficit.     Deep Tendon Reflexes: Reflexes are normal and symmetric.  Psychiatric:         Behavior: Behavior normal.        Thought Content: Thought content normal.        Judgment: Judgment normal.      BP 133/78   Pulse 91   Temp 98.6 F (37 C)   Ht _0  (1.575 m)   Wt 142 lb (64.4 kg)   LMP 10/16/2014   SpO2 97%   BMI 25.97 kg/m      Assessment & Plan:  Sandra Adams comes in today with chief complaint of Annual Exam and Vaginal Pain (Burning - 4 days now)   Diagnosis and orders addressed:  1. GAD (generalized anxiety disorder) - ALPRAZolam (XANAX) 0.25 MG tablet; Take 1 tablet (0.25 mg total) by mouth 2 (two) times daily as needed for anxiety.  Dispense: 45 tablet; Refill: 2 - CBC with Differential/Platelet - CMP14+EGFR  2. Controlled substance agreement signed - ALPRAZolam (XANAX) 0.25 MG tablet; Take 1 tablet (0.25 mg total) by mouth 2 (two) times daily as needed for anxiety.  Dispense: 45 tablet; Refill: 2 - CBC with Differential/Platelet - CMP14+EGFR  3. Overweight (BMI 25.0-29.9) - ALPRAZolam (XANAX) 0.25 MG tablet; Take 1 tablet (0.25 mg total) by mouth 2 (two) times daily as needed for anxiety.  Dispense: 45 tablet; Refill: 2 - CBC with Differential/Platelet - CMP14+EGFR  4. Annual physical exam - DG [facility] DEXA; Future - Urine Culture - Urinalysis, Complete - Lipid panel - CBC with Differential/Platelet - CMP14+EGFR - TSH  5. Migraine without aura and without status migrainosus, not intractable - CBC with Differential/Platelet - CMP14+EGFR  6. Gastroesophageal reflux disease without esophagitis - CBC with Differential/Platelet - CMP14+EGFR  7. Primary osteoarthritis involving multiple joints - CBC with Differential/Platelet - CMP14+EGFR  8. Moderate episode of recurrent major depressive disorder (HCC) - CBC with Differential/Platelet - CMP14+EGFR  9. Dysuria - Urine Culture - Urinalysis, Complete - CBC with Differential/Platelet - CMP14+EGFR  10. Post-menopause - DG [facility] DEXA; Future - CBC with  Differential/Platelet - CMP14+EGFR   Labs pending Health Maintenance reviewed Diet and exercise encouraged  Follow up plan: 6 months    [name], FNP

## 2022-05-20 NOTE — Patient Instructions (Signed)

## 2022-05-21 ENCOUNTER — Other Ambulatory Visit: Payer: Self-pay | Admitting: Family

## 2022-05-21 LAB — CBC WITH DIFFERENTIAL/PLATELET
Basophils Absolute: 0.1 10*3/uL (ref 0.0–0.2)
Basos: 1 %
EOS (ABSOLUTE): 0.3 10*3/uL (ref 0.0–0.4)
Eos: 3 %
Hematocrit: 40.2 % (ref 34.0–46.6)
Hemoglobin: 13.9 g/dL (ref 11.1–15.9)
Immature Grans (Abs): 0 10*3/uL (ref 0.0–0.1)
Immature Granulocytes: 0 %
Lymphocytes Absolute: 3.5 10*3/uL — ABNORMAL HIGH (ref 0.7–3.1)
Lymphs: 36 %
MCH: 32.9 pg (ref 26.6–33.0)
MCHC: 34.6 g/dL (ref 31.5–35.7)
MCV: 95 fL (ref 79–97)
Monocytes Absolute: 0.6 10*3/uL (ref 0.1–0.9)
Monocytes: 7 %
Neutrophils Absolute: 5.1 10*3/uL (ref 1.4–7.0)
Neutrophils: 53 %
Platelets: 202 10*3/uL (ref 150–450)
RBC: 4.22 x10E6/uL (ref 3.77–5.28)
RDW: 13 % (ref 11.7–15.4)
WBC: 9.6 10*3/uL (ref 3.4–10.8)

## 2022-05-21 LAB — CMP14+EGFR
ALT: 51 IU/L — ABNORMAL HIGH (ref 0–32)
AST: 28 IU/L (ref 0–40)
Albumin/Globulin Ratio: 1.8 (ref 1.2–2.2)
Albumin: 4.6 g/dL (ref 3.8–4.9)
Alkaline Phosphatase: 125 IU/L — ABNORMAL HIGH (ref 44–121)
BUN/Creatinine Ratio: 14 (ref 9–23)
BUN: 11 mg/dL (ref 6–24)
Bilirubin Total: 0.4 mg/dL (ref 0.0–1.2)
CO2: 25 mmol/L (ref 20–29)
Calcium: 9.5 mg/dL (ref 8.7–10.2)
Chloride: 104 mmol/L (ref 96–106)
Creatinine, Ser: 0.79 mg/dL (ref 0.57–1.00)
Globulin, Total: 2.6 g/dL (ref 1.5–4.5)
Glucose: 76 mg/dL (ref 70–99)
Potassium: 3.9 mmol/L (ref 3.5–5.2)
Sodium: 144 mmol/L (ref 134–144)
Total Protein: 7.2 g/dL (ref 6.0–8.5)
eGFR: 87 mL/min/{1.73_m2} (ref >59)

## 2022-05-21 LAB — LIPID PANEL
Chol/HDL Ratio: 3.6 ratio (ref 0.0–4.4)
Cholesterol, Total: 196 mg/dL (ref 100–199)
HDL: 55 mg/dL (ref >39)
LDL Chol Calc (NIH): 118 mg/dL — ABNORMAL HIGH (ref 0–99)
Triglycerides: 132 mg/dL (ref 0–149)
VLDL Cholesterol Cal: 23 mg/dL (ref 5–40)

## 2022-05-21 LAB — TSH: TSH: 1.61 u[IU]/mL (ref 0.450–4.500)

## 2022-05-22 LAB — URINE CULTURE

## 2022-05-31 ENCOUNTER — Encounter: Payer: Self-pay | Admitting: *Deleted

## 2022-06-16 ENCOUNTER — Telehealth: Payer: Self-pay | Admitting: Family

## 2022-06-16 NOTE — Telephone Encounter (Signed)
Mailbox is full, unable to leave message.

## 2022-06-23 NOTE — Telephone Encounter (Signed)
No answer, no voicemail.  Results notes letter was mailed to patient on 05/31/22.

## 2022-06-29 ENCOUNTER — Telehealth: Payer: Self-pay | Admitting: Family

## 2022-06-29 ENCOUNTER — Ambulatory Visit (INDEPENDENT_AMBULATORY_CARE_PROVIDER_SITE_OTHER): Payer: Medicare Other | Admitting: Family

## 2022-06-29 ENCOUNTER — Encounter: Payer: Self-pay | Admitting: Family

## 2022-06-29 DIAGNOSIS — R350 Frequency of micturition: Secondary | ICD-10-CM | POA: Diagnosis not present

## 2022-06-29 DIAGNOSIS — R531 Weakness: Secondary | ICD-10-CM

## 2022-06-29 NOTE — Telephone Encounter (Signed)
Patient would like for a nurse to call her about her labs from 6/1

## 2022-06-29 NOTE — Addendum Note (Signed)
Addended by: Milas Hock on: 06/29/2022 04:52 PM   Modules accepted: Orders

## 2022-06-29 NOTE — Telephone Encounter (Signed)
Labs reviewed with pt 

## 2022-06-29 NOTE — Progress Notes (Signed)
Virtual Visit  Note Due to COVID-19 pandemic this visit was conducted virtually. This visit type was conducted due to national recommendations for restrictions regarding the COVID-19 Pandemic (e.g. social distancing, sheltering in place) in an effort to limit this patient's exposure and mitigate transmission in our community. All issues noted in this document were discussed and addressed.  A physical exam was not performed with this format.  I connected with Sandra Adams on 06/29/22 at 4:19 AM by telephone and verified that I am speaking with the correct person using two identifiers. Sandra Adams is currently located at home and no one is currently with her during visit. The provider, [name], FNP is located in their office at time of visit.  I discussed the limitations, risks, security and privacy concerns of performing an evaluation and management service by telephone and the availability of in person appointments. I also discussed with the patient that there may be a patient responsible charge related to this service. The patient expressed understanding and agreed to proceed.  Sandra Adams, Sandra Adams are scheduled for a virtual visit with your provider today.    Just as we do with appointments in the office, we must obtain your consent to participate.  Your consent will be active for this visit and any virtual visit you may have with one of our providers in the next 365 days.    If you have a MyChart account, I can also send a copy of this consent to you electronically.  All virtual visits are billed to your insurance company just like a traditional visit in the office.  As this is a virtual visit, video technology does not allow for your provider to perform a traditional examination.  This may limit your provider's ability to fully assess your condition.  If your provider identifies any concerns that need to be evaluated in person or the need to arrange testing such as labs, EKG, etc, we will  make arrangements to do so.    Although advances in technology are sophisticated, we cannot ensure that it will always work on either your end or our end.  If the connection with a video visit is poor, we may have to switch to a telephone visit.  With either a video or telephone visit, we are not always able to ensure that we have a secure connection.   I need to obtain your verbal consent now.   Are you willing to proceed with your visit today?   Sandra Adams has provided verbal consent on 06/29/2022 for a virtual visit (video or telephone).   [name], [place] 06/29/2022  4:20 PM   History and Present Illness:  Pt calls the office today with weakness and has hx of UTI's. Urinary Frequency  This is a recurrent problem. The current episode started in the past 7 days. The problem occurs intermittently. The problem has been waxing and waning. The pain is at a severity of 3/10. The patient is experiencing no pain. Associated symptoms include frequency and urgency. Pertinent negatives include no hematuria, nausea or vomiting. She has tried increased fluids for the symptoms. The treatment provided mild relief.       Review of Systems  Gastrointestinal:  Negative for nausea and vomiting.  Genitourinary:  Positive for frequency and urgency. Negative for hematuria.  All other systems reviewed and are negative.    Observations/Objective: No SOB or distress noted   Assessment and Plan: 1. Urinary frequency - Urinalysis, Complete; Future  2.  Weakness  She will bring urine in today or tomorrow Force fluids Rest Encourage healthy diet  Follow up as needed      I discussed the assessment and treatment plan with the patient. The patient was provided an opportunity to ask questions and all were answered. The patient agreed with the plan and demonstrated an understanding of the instructions.   The patient was advised to call back or seek an in-person evaluation if the symptoms worsen  or if the condition fails to improve as anticipated.  The above assessment and management plan was discussed with the patient. The patient verbalized understanding of and has agreed to the management plan. Patient is aware to call the clinic if symptoms persist or worsen. Patient is aware when to return to the clinic for a follow-up visit. Patient educated on when it is appropriate to go to the emergency department.   Time call ended:  4:33 pm   I provided 14 minutes of  non face-to-face time during this encounter.    [name], FNP

## 2022-06-30 LAB — URINALYSIS, COMPLETE
Bilirubin, UA: NEGATIVE
Glucose, UA: NEGATIVE
Nitrite, UA: NEGATIVE
Protein,UA: NEGATIVE
Specific Gravity, UA: >1.03 — ABNORMAL HIGH (ref 1.005–1.030)
Urobilinogen, Ur: 0.2 mg/dL (ref 0.2–1.0)
pH, UA: 5 (ref 5.0–7.5)

## 2022-07-01 LAB — URINE CULTURE

## 2022-07-11 ENCOUNTER — Other Ambulatory Visit: Payer: Self-pay | Admitting: Family

## 2022-07-15 ENCOUNTER — Ambulatory Visit: Payer: Self-pay | Admitting: *Deleted

## 2022-07-15 NOTE — Chronic Care Management (AMB) (Signed)
  Chronic Care Management   Note  07/15/2022 Name: Sandra Adams MRN: [id number] DOB: 07-Mar-1966   Patient has either met RN Care Management goals, is stable from [place] Management perspective, or has not recently engaged with the RN Care Manager. I am removing RN Care Manager from Care Team and closing [name]. Patient is currently engaged with another CCM team member, Theadore Nan, LCSW. If they are engaged with another CCM Team Member, I will forward this case closure encounter to them. Patient does not have a current CCM referral placed since 04/19/22. CCM enrollment status was not accurate and was changed to "not enrolled".  Their PCP can place a new referral if the patient needs Care Management or Care Coordination services in the future.  Chong Sicilian, BSN, RN-BC Embedded Chronic Care Manager [facility] Direct Dial: [phone]

## 2022-07-15 NOTE — Patient Instructions (Signed)
[name]  I have either worked with you through the Chronic Care Management Program at [facility] or I was listed on your Care Team at some point within the last 4 years. Due to program changes I am removing myself from your care team because you've either met our goals, your conditions are stable, you no longer require care management, we haven't engaged within the past 6 months, or we have never worked together through the [facility].   If you are currently active with another CCM Team Member, you will remain active with them unless they reach out to you with additional information.   If you feel that you need services in the future,  please talk with your primary care provider and request a new referral for Care Management or Care Coordination. This does not affect your status as a patient at [facility].   Thank you for allowing me to participate in your your healthcare journey.  Chong Sicilian, BSN, RN-BC Embedded Chronic Care Manager [facility] / [facility] Direct Dial: [phone]

## 2022-07-20 ENCOUNTER — Ambulatory Visit (INDEPENDENT_AMBULATORY_CARE_PROVIDER_SITE_OTHER): Payer: Medicare Other | Admitting: Nurse Practitioner

## 2022-07-20 ENCOUNTER — Encounter: Payer: Self-pay | Admitting: Nurse Practitioner

## 2022-07-20 VITALS — BP 130/81 | HR 88 | Temp 98.6°F | Ht 62.0 in | Wt 143.0 lb

## 2022-07-20 DIAGNOSIS — H9203 Otalgia, bilateral: Secondary | ICD-10-CM | POA: Diagnosis not present

## 2022-07-20 MED ORDER — DOXYCYCLINE HYCLATE 100 MG PO TABS: 100.0000 mg | ORAL_TABLET | Freq: Two times a day (BID) | ORAL | 0 refills | Status: DC

## 2022-07-20 MED ORDER — IBUPROFEN 600 MG PO TABS: 600.0000 mg | ORAL_TABLET | Freq: Three times a day (TID) | ORAL | 0 refills | Status: DC | PRN

## 2022-07-20 NOTE — Patient Instructions (Signed)
Earache, Adult ?An earache, or ear pain, can be caused by many things, including: ?An infection. ?Ear wax buildup. ?Ear pressure. ?Something in the ear that should not be there (foreign body). ?A sore throat. ?Tooth problems. ?Jaw problems. ?Treatment of the earache will depend on the cause. If the cause is not clear or cannot be determined, you may need to watch your symptoms until your earache goes away or until a cause is found. ?Follow these instructions at home: ?Medicines ?Take or apply over-the-counter and prescription medicines only as told by your health care provider. ?If you were prescribed an antibiotic medicine, use it as told by your health care provider. Do not stop using the antibiotic even if you start to feel better. ?Do not put anything in your ear other than medicine that is prescribed by your health care provider. ?Managing pain ?If directed, apply heat to the affected area as often as told by your health care provider. Use the heat source that your health care provider recommends, such as a moist heat pack or a heating pad. ?Place a towel between your skin and the heat source. ?Leave the heat on for 20-30 minutes. ?Remove the heat if your skin turns bright red. This is especially important if you are unable to feel pain, heat, or cold. You may have a greater risk of getting burned. ?If directed, put ice on the affected area as often as told by your health care provider. To do this: ? ?  ? ?Put ice in a plastic bag. ?Place a towel between your skin and the bag. ?Leave the ice on for 20 minutes, 2-3 times a day. ?General instructions ?Pay attention to any changes in your symptoms. ?Try resting in an upright position instead of lying down. This may help to reduce pressure in your ear and relieve pain. ?Chew gum if it helps to relieve your ear pain. ?Treat any allergies as told by your health care provider. ?Drink enough fluid to keep your urine pale yellow. ?It is up to you to get the results of  any tests that were done. Ask your health care provider, or the department that is doing the tests, when your results will be ready. ?Keep all follow-up visits as told by your health care provider. This is important. ?Contact a health care provider if: ?Your pain does not improve within 2 days. ?Your earache gets worse. ?You have new symptoms. ?You have a fever. ?Get help right away if you: ?Have a severe headache. ?Have a stiff neck. ?Have trouble swallowing. ?Have redness or swelling behind your ear. ?Have fluid or blood coming from your ear. ?Have hearing loss. ?Feel dizzy. ?Summary ?An earache, or ear pain, can be caused by many things. ?Treatment of the earache will depend on the cause. Follow recommendations from your health care provider to treat your ear pain. ?If the cause is not clear or cannot be determined, you may need to watch your symptoms until your earache goes away or until a cause is found. ?Keep all follow-up visits as told by your health care provider. This is important. ?This information is not intended to replace advice given to you by your health care provider. Make sure you discuss any questions you have with your health care provider. ?Document Revised: 07/13/2019 Document Reviewed: 07/14/2019 ?Elsevier Patient Education ? 2023 Elsevier Inc. ? ?

## 2022-07-20 NOTE — Progress Notes (Signed)
Acute Office Visit  Subjective:     Patient ID: Sandra Adams, female    DOB: 11-09-1966, 56 y.o.   MRN: [id number]  Chief Complaint  Patient presents with   Ear Fullness    Both ears , pressure - hard hearing    Ear Fullness  There is pain in both ears. This is a new problem. Episode onset: in the past 2-3 days. The problem occurs constantly. The problem has been unchanged. There has been no fever. The pain is moderate. Pertinent negatives include no headaches, hearing loss or rash. She has tried nothing for the symptoms.     Review of Systems  Constitutional: Negative.  Negative for chills and fever.  HENT:  Positive for ear pain. Negative for hearing loss.   Respiratory: Negative.    Cardiovascular: Negative.   Skin: Negative.  Negative for itching and rash.  Neurological:  Negative for headaches.  All other systems reviewed and are negative.       Objective:    BP 130/81   Pulse 88   Temp 98.6 F (37 C)   Ht '5\' 2"'$  (1.575 m)   Wt 143 lb (64.9 kg)   LMP 10/16/2014   SpO2 98%   BMI 26.16 kg/m  BP Readings from Last 3 Encounters:  07/20/22 130/81  05/20/22 133/78  03/25/22 132/87   Wt Readings from Last 3 Encounters:  07/20/22 143 lb (64.9 kg)  05/20/22 142 lb (64.4 kg)  03/25/22 141 lb (64 kg)      Physical Exam Vitals and nursing note reviewed.  Constitutional:      Appearance: Normal appearance.  HENT:     Head: Normocephalic.     Right Ear: External ear normal. Tenderness present. No drainage or swelling. There is no impacted cerumen. No foreign body. No mastoid tenderness.     Left Ear: External ear normal. Tenderness present. No drainage or swelling. A middle ear effusion is present. There is no impacted cerumen. No foreign body. No mastoid tenderness.     Nose: Nose normal.     Mouth/Throat:     Mouth: Mucous membranes are moist.     Pharynx: Oropharynx is clear.  Eyes:     Conjunctiva/sclera: Conjunctivae normal.  Cardiovascular:      Rate and Rhythm: Normal rate and regular rhythm.     Pulses: Normal pulses.     Heart sounds: Normal heart sounds.  Pulmonary:     Effort: Pulmonary effort is normal.     Breath sounds: Normal breath sounds.  Abdominal:     General: Bowel sounds are normal.  Musculoskeletal:        General: Normal range of motion.  Skin:    General: Skin is warm.     Findings: No erythema.  Neurological:     General: No focal deficit present.     Mental Status: She is alert and oriented to person, place, and time.  Psychiatric:        Behavior: Behavior normal.     No results found for any visits on 07/20/22.      Assessment & Plan:  Bilateral ear pain not weill controlled. Started patient Doxycycline, tylenol or ibuprofen for pain as needed, education provided with printed hand out given, follow up with worsening unresolved symptoms.    Problem List Items Addressed This Visit   None Visit Diagnoses     Otalgia of both ears    -  Primary   Relevant Medications   ibuprofen (  ADVIL) 600 MG tablet   doxycycline (VIBRA-TABS) 100 MG tablet       Meds ordered this encounter  Medications   ibuprofen (ADVIL) 600 MG tablet    Sig: Take 1 tablet (600 mg total) by mouth every 8 (eight) hours as needed.    Dispense:  30 tablet    Refill:  0    Order Specific Question:   Supervising Provider    Answer:   Claretta Fraise [[id number]]   doxycycline (VIBRA-TABS) 100 MG tablet    Sig: Take 1 tablet (100 mg total) by mouth 2 (two) times daily.    Dispense:  14 tablet    Refill:  0    Order Specific Question:   Supervising Provider    Answer:   Claretta Fraise [phone]    Return if symptoms worsen or fail to improve.  Ivy Lynn, NP

## 2022-07-21 DIAGNOSIS — H6983 Other specified disorders of Eustachian tube, bilateral: Secondary | ICD-10-CM | POA: Diagnosis not present

## 2022-07-21 DIAGNOSIS — H6591 Unspecified nonsuppurative otitis media, right ear: Secondary | ICD-10-CM | POA: Diagnosis not present

## 2022-08-14 DIAGNOSIS — G8929 Other chronic pain: Secondary | ICD-10-CM | POA: Diagnosis not present

## 2022-08-14 DIAGNOSIS — M79644 Pain in right finger(s): Secondary | ICD-10-CM | POA: Diagnosis not present

## 2022-08-17 ENCOUNTER — Ambulatory Visit (INDEPENDENT_AMBULATORY_CARE_PROVIDER_SITE_OTHER): Payer: Medicare Other

## 2022-08-17 ENCOUNTER — Encounter: Payer: Self-pay | Admitting: Family

## 2022-08-17 ENCOUNTER — Ambulatory Visit (INDEPENDENT_AMBULATORY_CARE_PROVIDER_SITE_OTHER): Payer: Medicare Other | Admitting: Family

## 2022-08-17 VITALS — BP 140/73 | HR 80 | Temp 97.0°F | Ht 62.0 in | Wt 141.0 lb

## 2022-08-17 DIAGNOSIS — M79644 Pain in right finger(s): Secondary | ICD-10-CM | POA: Diagnosis not present

## 2022-08-17 DIAGNOSIS — M7989 Other specified soft tissue disorders: Secondary | ICD-10-CM | POA: Diagnosis not present

## 2022-08-17 MED ORDER — IBUPROFEN 600 MG PO TABS: 600.0000 mg | ORAL_TABLET | Freq: Three times a day (TID) | ORAL | 0 refills | Status: DC | PRN

## 2022-08-17 MED ORDER — GABAPENTIN 100 MG PO CAPS: 100.0000 mg | ORAL_CAPSULE | Freq: Three times a day (TID) | ORAL | 3 refills | Status: DC

## 2022-08-17 NOTE — Progress Notes (Signed)
Subjective:    Patient ID: Sandra Adams, female    DOB: October 27, 1966, 56 y.o.   MRN: [id number]  Chief Complaint  Patient presents with   thumb pain   PT presents to the office today with right thumb pain that started several months ago. Denies any injury. Reports mild numbness around her nail and joint.   Denies any hx of gout.   She went to the Urgent Care three days ago. She was given naproxen that has mildly help. She was given a wrist splint that helped.  Hand Pain  The incident occurred more than 1 week ago. There was no injury mechanism. Pain location: right thumb. The quality of the pain is described as aching. The pain is at a severity of 3/10. The pain is mild. The pain has been Intermittent since the incident. Associated symptoms include numbness and tingling. Nothing aggravates the symptoms. She has tried NSAIDs for the symptoms. The treatment provided mild relief.      Review of Systems  Neurological:  Positive for tingling and numbness.  All other systems reviewed and are negative.      Objective:   Physical Exam Vitals reviewed.  Constitutional:      General: She is not in acute distress.    Appearance: She is well-developed.  HENT:     Head: Normocephalic and atraumatic.  Eyes:     Pupils: Pupils are equal, round, and reactive to light.  Neck:     Thyroid: No thyromegaly.  Cardiovascular:     Rate and Rhythm: Normal rate and regular rhythm.     Heart sounds: Normal heart sounds. No murmur heard. Pulmonary:     Effort: Pulmonary effort is normal. No respiratory distress.     Breath sounds: Normal breath sounds. No wheezing.  Abdominal:     General: Bowel sounds are normal. There is no distension.     Palpations: Abdomen is soft.     Tenderness: There is no abdominal tenderness.  Musculoskeletal:        General: Tenderness (pain in right thumb nail with flexion) present. Normal range of motion.     Cervical back: Normal range of motion and neck  supple.     Comments: Full ROM   Skin:    General: Skin is warm and dry.  Neurological:     Mental Status: She is alert and oriented to person, place, and time.     Cranial Nerves: No cranial nerve deficit.     Deep Tendon Reflexes: Reflexes are normal and symmetric.  Psychiatric:        Behavior: Behavior normal.        Thought Content: Thought content normal.        Judgment: Judgment normal.     BP (!) 140/73   Pulse 80   Temp (!) 97 F (36.1 C) (Temporal)   Ht _0  (1.575 m)   Wt 141 lb (64 kg)   LMP 10/16/2014   BMI 25.79 kg/m        Assessment & Plan:  Sandra Adams comes in today with chief complaint of thumb pain   Diagnosis and orders addressed:  1. Pain of right thumb Rest Continue to splint  PT stopped naprosyn and start motrin 600 mg TID prn ROM exercise  Labs pending  Follow up if symptoms worsen or do not improve  - DG Finger Thumb Right; Future - gabapentin (NEURONTIN) 100 MG capsule; Take 1 capsule (100 mg total) by mouth  3 (three) times daily.  Dispense: 90 capsule; Refill: 3 - ibuprofen (ADVIL) 600 MG tablet; Take 1 tablet (600 mg total) by mouth every 8 (eight) hours as needed.  Dispense: 30 tablet; Refill: 0 - BMP8+EGFR - Uric acid    [name], FNP

## 2022-08-17 NOTE — Patient Instructions (Signed)
Hand Pain Many things can cause hand pain. Some common causes are: An injury. Repeating the same movement with your hand over and over (overuse). Osteoporosis. Arthritis. Lumps in the tendons or joints of the hand and wrist (ganglion cysts). Nerve compression syndromes (carpal tunnel syndrome). Inflammation of the tendons (tendinitis). Infection. Follow these instructions at home: Pay attention to any changes in your symptoms. Take these actions to help with your discomfort: Managing pain, stiffness, and swelling  Take over-the-counter and prescription medicines only as told by your health care provider. Wear a hand splint or support as told by your health care provider. If directed, put ice on the affected area: Put ice in a plastic bag. Place a towel between your skin and the bag. Leave the ice on for 20 minutes, 2-3 times a day. Activity Take breaks from repetitive activity often. Avoid activities that make your pain worse. Minimize stress on your hands and wrists as much as possible. Do stretches or exercises as told by your health care provider. Do not do activities that make your pain worse. Contact a health care provider if: Your pain does not get better after a few days of self-care. Your pain gets worse. Your pain affects your ability to do your daily activities. Get help right away if: Your hand becomes warm, red, or swollen. Your hand is numb or tingling. Your hand is extremely swollen or deformed. Your hand or fingers turn white or blue. You cannot move your hand, wrist, or fingers. Summary Many things can cause hand pain. Contact your health care provider if your pain does not get better after a few days of self care. Minimize stress on your hands and wrists as much as possible. Do not do activities that make your pain worse. This information is not intended to replace advice given to you by your health care provider. Make sure you discuss any questions you have  with your health care provider. Document Revised: 03/25/2022 Document Reviewed: 03/26/2021 Elsevier Patient Education  2023 Elsevier Inc.  

## 2022-08-18 LAB — BMP8+EGFR
BUN/Creatinine Ratio: 13 (ref 9–23)
BUN: 11 mg/dL (ref 6–24)
CO2: 25 mmol/L (ref 20–29)
Calcium: 9.8 mg/dL (ref 8.7–10.2)
Chloride: 103 mmol/L (ref 96–106)
Creatinine, Ser: 0.82 mg/dL (ref 0.57–1.00)
Glucose: 93 mg/dL (ref 70–99)
Potassium: 5 mmol/L (ref 3.5–5.2)
Sodium: 144 mmol/L (ref 134–144)
eGFR: 83 mL/min/{1.73_m2} (ref >59)

## 2022-08-18 LAB — URIC ACID: Uric Acid: 5.3 mg/dL (ref 3.0–7.2)

## 2022-08-30 ENCOUNTER — Encounter: Payer: Self-pay | Admitting: Family

## 2022-08-30 ENCOUNTER — Ambulatory Visit (INDEPENDENT_AMBULATORY_CARE_PROVIDER_SITE_OTHER): Payer: Medicare Other | Admitting: Family

## 2022-08-30 DIAGNOSIS — U071 COVID-19: Secondary | ICD-10-CM | POA: Diagnosis not present

## 2022-08-30 DIAGNOSIS — J029 Acute pharyngitis, unspecified: Secondary | ICD-10-CM | POA: Diagnosis not present

## 2022-08-30 DIAGNOSIS — R350 Frequency of micturition: Secondary | ICD-10-CM | POA: Diagnosis not present

## 2022-08-30 MED ORDER — MOLNUPIRAVIR EUA 200MG CAPSULE: 4.0000 | ORAL_CAPSULE | Freq: Two times a day (BID) | ORAL | 0 refills | Status: AC

## 2022-08-30 NOTE — Progress Notes (Signed)
Virtual Visit  Note Due to COVID-19 pandemic this visit was conducted virtually. This visit type was conducted due to national recommendations for restrictions regarding the COVID-19 Pandemic (e.g. social distancing, sheltering in place) in an effort to limit this patient's exposure and mitigate transmission in our community. All issues noted in this document were discussed and addressed.  A physical exam was not performed with this format.  I connected with [name] on 08/30/22 at 11:51 AM by telephone and verified that I am speaking with the correct person using two identifiers. [name] is currently located at home and no one is currently with her during visit. The provider, [name], FNP is located in their office at time of visit.  I discussed the limitations, risks, security and privacy concerns of performing an evaluation and management service by telephone and the availability of in person appointments. I also discussed with the patient that there may be a patient responsible charge related to this service. The patient expressed understanding and agreed to proceed.  Ms. cyera, balboni are scheduled for a virtual visit with your provider today.    Just as we do with appointments in the office, we must obtain your consent to participate.  Your consent will be active for this visit and any virtual visit you may have with one of our providers in the next 365 days.    If you have a MyChart account, I can also send a copy of this consent to you electronically.  All virtual visits are billed to your insurance company just like a traditional visit in the office.  As this is a virtual visit, video technology does not allow for your provider to perform a traditional examination.  This may limit your provider's ability to fully assess your condition.  If your provider identifies any concerns that need to be evaluated in person or the need to arrange testing such as labs, EKG, etc, we  will make arrangements to do so.    Although advances in technology are sophisticated, we cannot ensure that it will always work on either your end or our end.  If the connection with a video visit is poor, we may have to switch to a telephone visit.  With either a video or telephone visit, we are not always able to ensure that we have a secure connection.   I need to obtain your verbal consent now.   Are you willing to proceed with your visit today?   HIND CHESLER has provided verbal consent on 08/30/2022 for a virtual visit (video or telephone).   [name], [place] 08/30/2022  11:50 AM    History and Present Illness:  PT calls the office today with COVID. States her symptoms started two days ago with sore throat, fever, chills, and body aches.  Cough This is a new problem. The current episode started in the past 7 days. The problem has been waxing and waning. The problem occurs every few minutes. The cough is Non-productive. Associated symptoms include chills, a fever, headaches, myalgias, nasal congestion, postnasal drip, rhinorrhea and a sore throat. Pertinent negatives include no ear congestion, ear pain or shortness of breath. She has tried rest (tylenol) for the symptoms. The treatment provided mild relief.      Review of Systems  Constitutional:  Positive for chills and fever.  HENT:  Positive for postnasal drip, rhinorrhea and sore throat. Negative for ear pain.   Respiratory:  Positive for cough. Negative for shortness of  breath.   Musculoskeletal:  Positive for myalgias.  Neurological:  Positive for headaches.     Observations/Objective: No SOB or distress noted, intermittent cough  Assessment and Plan: 1. Telehealth encounter for confirmed COVID-19 COVID positive, rest, force fluids, tylenol as needed, Quarantine for at least 5 days and you are fever free, then must wear a mask out in public from day 1-27, report any worsening symptoms such as increased shortness of  breath, swelling, or continued high fevers. Possible adverse effects discussed with antivirals.  - molnupiravir EUA (LAGEVRIO) 200 mg CAPS capsule; Take 4 capsules (800 mg total) by mouth 2 (two) times daily for 5 days.  Dispense: 40 capsule; Refill: 0     I discussed the assessment and treatment plan with the patient. The patient was provided an opportunity to ask questions and all were answered. The patient agreed with the plan and demonstrated an understanding of the instructions.   The patient was advised to call back or seek an in-person evaluation if the symptoms worsen or if the condition fails to improve as anticipated.  The above assessment and management plan was discussed with the patient. The patient verbalized understanding of and has agreed to the management plan. Patient is aware to call the clinic if symptoms persist or worsen. Patient is aware when to return to the clinic for a follow-up visit. Patient educated on when it is appropriate to go to the emergency department.   Time call ended:  12:03 pm   I provided 12 minutes of  non face-to-face time during this encounter.    [name], FNP

## 2022-09-02 ENCOUNTER — Telehealth: Payer: Self-pay | Admitting: Family

## 2022-09-02 NOTE — Telephone Encounter (Signed)
Pt says that she cannot take molnupiravir EUA (LAGEVRIO) 200 mg CAPS capsule. It makes her stomach hurts and makes her feel nervous. Please call back

## 2022-09-02 NOTE — Telephone Encounter (Signed)
NA

## 2022-09-02 NOTE — Telephone Encounter (Signed)
This fine. She can rest, force fluids, tylenol as needed.

## 2022-09-03 ENCOUNTER — Telehealth: Payer: Self-pay | Admitting: Family

## 2022-09-03 NOTE — Telephone Encounter (Signed)
Lmtcb.

## 2022-09-03 NOTE — Telephone Encounter (Signed)
No other medications I can prescribe her. This is viral and will run its course. Can use OTC medications.

## 2022-09-03 NOTE — Telephone Encounter (Signed)
  Incoming Patient Call  09/03/2022  What symptoms do you have? Weak, meds make her nervous and still testing positive for COVID  How long have you been sick? Since 9-11  Have you been seen for this problem? YES Had a televisit 9-11 with Alyse Low  If your provider decides to give you a prescription, which pharmacy would you like for it to be sent to? [facility] in [place]   Patient informed that this information will be sent to the clinical staff for review and that they should receive a follow up call.

## 2022-09-04 DIAGNOSIS — N3090 Cystitis, unspecified without hematuria: Secondary | ICD-10-CM | POA: Diagnosis not present

## 2022-09-04 DIAGNOSIS — R39198 Other difficulties with micturition: Secondary | ICD-10-CM | POA: Diagnosis not present

## 2022-09-14 ENCOUNTER — Ambulatory Visit: Payer: Medicare Other | Admitting: Family Medicine

## 2022-09-14 DIAGNOSIS — R300 Dysuria: Secondary | ICD-10-CM

## 2022-09-15 ENCOUNTER — Encounter: Payer: Self-pay | Admitting: Family Medicine

## 2022-09-15 ENCOUNTER — Ambulatory Visit (INDEPENDENT_AMBULATORY_CARE_PROVIDER_SITE_OTHER): Payer: Medicare Other | Admitting: Family Medicine

## 2022-09-15 VITALS — BP 118/71 | HR 86 | Temp 97.0°F | Resp 20 | Ht 62.0 in | Wt 141.0 lb

## 2022-09-15 DIAGNOSIS — Z87440 Personal history of urinary (tract) infections: Secondary | ICD-10-CM

## 2022-09-15 DIAGNOSIS — H6982 Other specified disorders of Eustachian tube, left ear: Secondary | ICD-10-CM | POA: Diagnosis not present

## 2022-09-15 DIAGNOSIS — N390 Urinary tract infection, site not specified: Secondary | ICD-10-CM | POA: Diagnosis not present

## 2022-09-15 DIAGNOSIS — R300 Dysuria: Secondary | ICD-10-CM

## 2022-09-15 DIAGNOSIS — N898 Other specified noninflammatory disorders of vagina: Secondary | ICD-10-CM

## 2022-09-15 LAB — WET PREP FOR TRICH, YEAST, CLUE
Clue Cell Exam: NEGATIVE
Trichomonas Exam: NEGATIVE
Yeast Exam: NEGATIVE

## 2022-09-15 LAB — URINALYSIS, ROUTINE W REFLEX MICROSCOPIC
Bilirubin, UA: NEGATIVE
Glucose, UA: NEGATIVE
Leukocytes,UA: NEGATIVE
Nitrite, UA: NEGATIVE
Protein,UA: NEGATIVE
Specific Gravity, UA: 1.025 (ref 1.005–1.030)
Urobilinogen, Ur: 0.2 mg/dL (ref 0.2–1.0)
pH, UA: 6.5 (ref 5.0–7.5)

## 2022-09-15 LAB — MICROSCOPIC EXAMINATION
Bacteria, UA: NONE SEEN
RBC, Urine: NONE SEEN /hpf (ref 0–2)
Renal Epithel, UA: NONE SEEN /hpf

## 2022-09-15 MED ORDER — FLUTICASONE PROPIONATE 50 MCG/ACT NA SUSP: 2.0000 | Freq: Every day | NASAL | 6 refills | Status: DC

## 2022-09-15 NOTE — Progress Notes (Signed)
Assessment & Plan:  1. History of UTI Advised to complete antibiotics. Discussed change in antibiotic is not indicated as antibiotic seems to be effective since her symptoms have resolved. Also discussed yellow urine indicates she needs to drink more water to obtain that pale yellow urine color. Will culture urine just to ensure nothing grows. - Urinalysis, Routine w reflex microscopic - Urine Culture - Microscopic Examination  2. Vaginal itching - WET PREP FOR TRICH, YEAST, CLUE - negative   Follow up plan: Return if symptoms worsen or fail to improve.  Hendricks Limes, MSN, APRN, FNP-C [facility]  Subjective:   Patient ID: Sandra Adams, female    DOB: 25-Dec-1965, 56 y.o.   MRN: [id number]  HPI: Sandra Adams is a 56 y.o. female presenting on 09/15/2022 for Urinary Tract Infection  Patient is accompanied by her significant other, who she is okay with being present.  Patient is concerned that she still has a urinary tract infection. She was seen at urgent care on 09/04/2022 at which time she was prescribed Keflex. She states she doesn't normally take Keflex and she needs Macrobid. Symptoms present at diagnosis include abnormal urine odor, urgency, frequency, and pressure with urination. Today she reports these symptoms have resolved, but her urine is still yellow. She does not drink any water throughout the day and states she only drinks Sprite and Ginger Ale. She also reports she was experiencing vaginal discharge, burning, and itching that has resolved. She wants to make sure she doesn't have a yeast infection.   ROS: Negative unless specifically indicated above in HPI.   Relevant past medical history reviewed and updated as indicated.   Allergies and medications reviewed and updated.   Current Outpatient Medications:    ALPRAZolam (XANAX) 0.25 MG tablet, Take 1 tablet (0.25 mg total) by mouth 2 (two) times daily as needed for anxiety., Disp: 45  tablet, Rfl: 2   fluticasone (FLONASE) 50 MCG/ACT nasal spray, Place 2 sprays into both nostrils daily., Disp: 16 g, Rfl: 6   gabapentin (NEURONTIN) 100 MG capsule, Take 1 capsule (100 mg total) by mouth 3 (three) times daily., Disp: 90 capsule, Rfl: 3   ibuprofen (ADVIL) 600 MG tablet, Take 1 tablet (600 mg total) by mouth every 8 (eight) hours as needed., Disp: 30 tablet, Rfl: 0  Allergies  Allergen Reactions   Ciprofloxacin     Makes her feel weak,dizzy   Imitrex [Sumatriptan]     Blurred vision    Prednisone Nausea And Vomiting   Tylenol [Acetaminophen]     Tylenol #3,Headaches   Vicodin [Hydrocodone-Acetaminophen]     GI upset    Objective:   BP 118/71   Pulse 86   Temp (!) 97 F (36.1 C)   Resp 20   Ht '5\' 2"'$  (1.575 m)   Wt 141 lb (64 kg)   LMP 10/16/2014   SpO2 98%   BMI 25.79 kg/m    Physical Exam Vitals reviewed.  Constitutional:      General: She is not in acute distress.    Appearance: Normal appearance. She is not ill-appearing, toxic-appearing or diaphoretic.  HENT:     Head: Normocephalic and atraumatic.  Eyes:     General: No scleral icterus.       Right eye: No discharge.        Left eye: No discharge.     Conjunctiva/sclera: Conjunctivae normal.  Cardiovascular:     Rate and Rhythm: Normal rate.  Pulmonary:  Effort: Pulmonary effort is normal. No respiratory distress.  Abdominal:     Tenderness: There is no right CVA tenderness or left CVA tenderness.  Musculoskeletal:        General: Normal range of motion.     Cervical back: Normal range of motion.  Skin:    General: Skin is warm and dry.     Capillary Refill: Capillary refill takes less than 2 seconds.  Neurological:     General: No focal deficit present.     Mental Status: She is alert and oriented to person, place, and time. Mental status is at baseline.  Psychiatric:        Mood and Affect: Mood normal.        Behavior: Behavior normal.        Thought Content: Thought content  normal.        Judgment: Judgment normal.

## 2022-09-16 ENCOUNTER — Encounter: Payer: Self-pay | Admitting: Family Medicine

## 2022-09-17 LAB — URINE CULTURE: Organism ID, Bacteria: NO GROWTH

## 2022-11-17 ENCOUNTER — Other Ambulatory Visit: Payer: Self-pay | Admitting: Family

## 2022-11-17 DIAGNOSIS — Z79899 Other long term (current) drug therapy: Secondary | ICD-10-CM

## 2022-11-17 DIAGNOSIS — E663 Overweight: Secondary | ICD-10-CM

## 2022-11-17 DIAGNOSIS — F411 Generalized anxiety disorder: Secondary | ICD-10-CM

## 2022-11-19 ENCOUNTER — Ambulatory Visit: Payer: Medicare Other | Admitting: Family

## 2022-11-30 ENCOUNTER — Encounter: Payer: Self-pay | Admitting: Family

## 2022-11-30 ENCOUNTER — Ambulatory Visit (INDEPENDENT_AMBULATORY_CARE_PROVIDER_SITE_OTHER): Payer: Medicare Other | Admitting: Family

## 2022-11-30 VITALS — BP 133/77 | HR 77 | Temp 97.0°F | Ht 62.0 in | Wt 142.0 lb

## 2022-11-30 DIAGNOSIS — M159 Polyosteoarthritis, unspecified: Secondary | ICD-10-CM

## 2022-11-30 DIAGNOSIS — H6992 Unspecified Eustachian tube disorder, left ear: Secondary | ICD-10-CM | POA: Diagnosis not present

## 2022-11-30 DIAGNOSIS — K219 Gastro-esophageal reflux disease without esophagitis: Secondary | ICD-10-CM

## 2022-11-30 DIAGNOSIS — R300 Dysuria: Secondary | ICD-10-CM

## 2022-11-30 DIAGNOSIS — E663 Overweight: Secondary | ICD-10-CM | POA: Diagnosis not present

## 2022-11-30 DIAGNOSIS — M79644 Pain in right finger(s): Secondary | ICD-10-CM | POA: Diagnosis not present

## 2022-11-30 DIAGNOSIS — F411 Generalized anxiety disorder: Secondary | ICD-10-CM

## 2022-11-30 DIAGNOSIS — Z79899 Other long term (current) drug therapy: Secondary | ICD-10-CM

## 2022-11-30 DIAGNOSIS — F331 Major depressive disorder, recurrent, moderate: Secondary | ICD-10-CM

## 2022-11-30 LAB — MICROSCOPIC EXAMINATION
Bacteria, UA: NONE SEEN
Epithelial Cells (non renal): NONE SEEN /hpf (ref 0–10)
RBC, Urine: NONE SEEN /hpf (ref 0–2)
Renal Epithel, UA: NONE SEEN /hpf

## 2022-11-30 LAB — URINALYSIS, COMPLETE
Bilirubin, UA: NEGATIVE
Glucose, UA: NEGATIVE
Ketones, UA: NEGATIVE
Leukocytes,UA: NEGATIVE
Nitrite, UA: NEGATIVE
RBC, UA: NEGATIVE
Specific Gravity, UA: >1.03 — ABNORMAL HIGH (ref 1.005–1.030)
Urobilinogen, Ur: 0.2 mg/dL (ref 0.2–1.0)
pH, UA: 5.5 (ref 5.0–7.5)

## 2022-11-30 MED ORDER — ESCITALOPRAM OXALATE 10 MG PO TABS: 10.0000 mg | ORAL_TABLET | Freq: Every day | ORAL | 3 refills | Status: DC

## 2022-11-30 MED ORDER — FLUTICASONE PROPIONATE 50 MCG/ACT NA SUSP: 2.0000 | Freq: Every day | NASAL | 6 refills | Status: DC

## 2022-11-30 MED ORDER — GABAPENTIN 100 MG PO CAPS: 100.0000 mg | ORAL_CAPSULE | Freq: Three times a day (TID) | ORAL | 3 refills | Status: DC

## 2022-11-30 MED ORDER — ALPRAZOLAM 0.25 MG PO TABS: 0.2500 mg | ORAL_TABLET | Freq: Two times a day (BID) | ORAL | 2 refills | Status: DC | PRN

## 2022-11-30 MED ORDER — IBUPROFEN 600 MG PO TABS: 600.0000 mg | ORAL_TABLET | Freq: Three times a day (TID) | ORAL | 0 refills | Status: DC | PRN

## 2022-11-30 NOTE — Patient Instructions (Signed)
Insomnia Insomnia is a sleep disorder that makes it difficult to fall asleep or stay asleep. Insomnia can cause fatigue, low energy, difficulty concentrating, mood swings, and poor performance at work or school. There are three different ways to classify insomnia: Difficulty falling asleep. Difficulty staying asleep. Waking up too early in the morning. Any type of insomnia can be long-term (chronic) or short-term (acute). Both are common. Short-term insomnia usually lasts for 3 months or less. Chronic insomnia occurs at least three times a week for longer than 3 months. What are the causes? Insomnia may be caused by another condition, situation, or substance, such as: Having certain mental health conditions, such as anxiety and depression. Using caffeine, alcohol, tobacco, or drugs. Having gastrointestinal conditions, such as gastroesophageal reflux disease (GERD). Having certain medical conditions. These include: Asthma. Alzheimer's disease. Stroke. Chronic pain. An overactive thyroid gland (hyperthyroidism). Other sleep disorders, such as restless legs syndrome and sleep apnea. Menopause. Sometimes, the cause of insomnia may not be known. What increases the risk? Risk factors for insomnia include: Gender. Females are affected more often than males. Age. Insomnia is more common as people get older. Stress and certain medical and mental health conditions. Lack of exercise. Having an irregular work schedule. This may include working night shifts and traveling between different time zones. What are the signs or symptoms? If you have insomnia, the main symptom is having trouble falling asleep or having trouble staying asleep. This may lead to other symptoms, such as: Feeling tired or having low energy. Feeling nervous about going to sleep. Not feeling rested in the morning. Having trouble concentrating. Feeling irritable, anxious, or depressed. How is this diagnosed? This condition  may be diagnosed based on: Your symptoms and medical history. Your health care provider may ask about: Your sleep habits. Any medical conditions you have. Your mental health. A physical exam. How is this treated? Treatment for insomnia depends on the cause. Treatment may focus on treating an underlying condition that is causing the insomnia. Treatment may also include: Medicines to help you sleep. Counseling or therapy. Lifestyle adjustments to help you sleep better. Follow these instructions at home: Eating and drinking  Limit or avoid alcohol, caffeinated beverages, and products that contain nicotine and tobacco, especially close to bedtime. These can disrupt your sleep. Do not eat a large meal or eat spicy foods right before bedtime. This can lead to digestive discomfort that can make it hard for you to sleep. Sleep habits  Keep a sleep diary to help you and your health care provider figure out what could be causing your insomnia. Write down: When you sleep. When you wake up during the night. How well you sleep and how rested you feel the next day. Any side effects of medicines you are taking. What you eat and drink. Make your bedroom a dark, comfortable place where it is easy to fall asleep. Put up shades or blackout curtains to block light from outside. Use a white noise machine to block noise. Keep the temperature cool. Limit screen use before bedtime. This includes: Not watching TV. Not using your smartphone, tablet, or computer. Stick to a routine that includes going to bed and waking up at the same times every day and night. This can help you fall asleep faster. Consider making a quiet activity, such as reading, part of your nighttime routine. Try to avoid taking naps during the day so that you sleep better at night. Get out of bed if you are still awake after   15 minutes of trying to sleep. Keep the lights down, but try reading or doing a quiet activity. When you feel  sleepy, go back to bed. General instructions Take over-the-counter and prescription medicines only as told by your health care provider. Exercise regularly as told by your health care provider. However, avoid exercising in the hours right before bedtime. Use relaxation techniques to manage stress. Ask your health care provider to suggest some techniques that may work well for you. These may include: Breathing exercises. Routines to release muscle tension. Visualizing peaceful scenes. Make sure that you drive carefully. Do not drive if you feel very sleepy. Keep all follow-up visits. This is important. Contact a health care provider if: You are tired throughout the day. You have trouble in your daily routine due to sleepiness. You continue to have sleep problems, or your sleep problems get worse. Get help right away if: You have thoughts about hurting yourself or someone else. Get help right away if you feel like you may hurt yourself or others, or have thoughts about taking your own life. Go to your nearest emergency room or: Call 911. Call the National Suicide Prevention Lifeline at 1-800-273-8255 or 988. This is open 24 hours a day. Text the Crisis Text Line at 741741. Summary Insomnia is a sleep disorder that makes it difficult to fall asleep or stay asleep. Insomnia can be long-term (chronic) or short-term (acute). Treatment for insomnia depends on the cause. Treatment may focus on treating an underlying condition that is causing the insomnia. Keep a sleep diary to help you and your health care provider figure out what could be causing your insomnia. This information is not intended to replace advice given to you by your health care provider. Make sure you discuss any questions you have with your health care provider. Document Revised: 11/16/2021 Document Reviewed: 11/16/2021 Elsevier Patient Education  2023 Elsevier Inc.  

## 2022-11-30 NOTE — Progress Notes (Signed)
Subjective:    Patient ID: Sandra Adams, female    DOB: Jan 08, 1966, 56 y.o.   MRN: [id number]  Chief Complaint  Patient presents with   Urinary Tract Infection    Yellow in color    Medical Management of Chronic Issues   Ear Fullness   Pt presents to the office today for chronic follow up and complaints of UTI symptoms.  Ear Fullness  Pertinent negatives include no vomiting.  Dysuria  This is a new problem. The current episode started in the past 7 days. The problem has been waxing and waning. The quality of the pain is described as burning. The patient is experiencing no pain. Associated symptoms include frequency and urgency. Pertinent negatives include no hematuria, nausea or vomiting.  Gastroesophageal Reflux She complains of belching and heartburn. She reports no nausea. This is a chronic problem. The current episode started more than 1 year ago. The problem occurs occasionally. The problem has been waxing and waning. Associated symptoms include fatigue. She has tried a diet change for the symptoms. The treatment provided mild relief.  Arthritis Presents for follow-up visit. She complains of pain and stiffness. The symptoms have been stable. Affected locations include the left knee, left MCP, right MCP and left hip. Associated symptoms include dysuria and fatigue.  Anxiety Presents for follow-up visit. Symptoms include depressed mood, excessive worry, irritability and nervous/anxious behavior. Patient reports no nausea. Symptoms occur most days. The severity of symptoms is moderate.    Depression        This is a chronic problem.  The current episode started more than 1 year ago.   The problem occurs intermittently.  Associated symptoms include fatigue, helplessness, hopelessness, decreased interest and sad.  Past medical history includes anxiety.       Review of Systems  Constitutional:  Positive for fatigue and irritability.  Gastrointestinal:  Positive for heartburn.  Negative for nausea and vomiting.  Genitourinary:  Positive for dysuria, frequency and urgency. Negative for hematuria.  Musculoskeletal:  Positive for arthritis and stiffness.  Psychiatric/Behavioral:  Positive for depression. The patient is nervous/anxious.   All other systems reviewed and are negative.      Objective:   Physical Exam Vitals reviewed.  Constitutional:      General: She is not in acute distress.    Appearance: She is well-developed.  HENT:     Head: Normocephalic and atraumatic.     Right Ear: Tympanic membrane normal.     Left Ear: Tympanic membrane normal.  Eyes:     Pupils: Pupils are equal, round, and reactive to light.  Neck:     Thyroid: No thyromegaly.  Cardiovascular:     Rate and Rhythm: Normal rate and regular rhythm.     Heart sounds: Normal heart sounds. No murmur heard. Pulmonary:     Effort: Pulmonary effort is normal. No respiratory distress.     Breath sounds: Normal breath sounds. No wheezing.  Abdominal:     General: Bowel sounds are normal. There is no distension.     Palpations: Abdomen is soft.     Tenderness: There is no abdominal tenderness.  Musculoskeletal:        General: No tenderness. Normal range of motion.     Cervical back: Normal range of motion and neck supple.  Skin:    General: Skin is warm and dry.  Neurological:     Mental Status: She is alert and oriented to person, place, and time.  Cranial Nerves: No cranial nerve deficit.     Deep Tendon Reflexes: Reflexes are normal and symmetric.  Psychiatric:        Behavior: Behavior normal.        Thought Content: Thought content normal.        Judgment: Judgment normal.     BP 133/77   Pulse 77   Temp (!) 97 F (36.1 C) (Temporal)   Ht _0  (1.575 m)   Wt 142 lb (64.4 kg)   LMP 10/16/2014   BMI 25.97 kg/m       Assessment & Plan:  Sandra Adams comes in today with chief complaint of Urinary Tract Infection (Yellow in color ), Medical Management of  Chronic Issues, and Ear Fullness   Diagnosis and orders addressed:  1. Dysuria - Urine Culture - Urinalysis, Complete - CMP14+EGFR - CBC with Differential/Platelet  2. GAD (generalized anxiety disorder) - ALPRAZolam (XANAX) 0.25 MG tablet; Take 1 tablet (0.25 mg total) by mouth 2 (two) times daily as needed for anxiety.  Dispense: 45 tablet; Refill: 2 - escitalopram (LEXAPRO) 10 MG tablet; Take 1 tablet (10 mg total) by mouth daily.  Dispense: 90 tablet; Refill: 3 - CMP14+EGFR - CBC with Differential/Platelet  3. Overweight (BMI 25.0-29.9) - ALPRAZolam (XANAX) 0.25 MG tablet; Take 1 tablet (0.25 mg total) by mouth 2 (two) times daily as needed for anxiety.  Dispense: 45 tablet; Refill: 2 - CMP14+EGFR - CBC with Differential/Platelet  4. Controlled substance agreement signed - ALPRAZolam (XANAX) 0.25 MG tablet; Take 1 tablet (0.25 mg total) by mouth 2 (two) times daily as needed for anxiety.  Dispense: 45 tablet; Refill: 2 - CMP14+EGFR - CBC with Differential/Platelet  5. Eustachian tube dysfunction, left - fluticasone (FLONASE) 50 MCG/ACT nasal spray; Place 2 sprays into both nostrils daily.  Dispense: 16 g; Refill: 6 - CMP14+EGFR - CBC with Differential/Platelet  6. Pain of right thumb - gabapentin (NEURONTIN) 100 MG capsule; Take 1 capsule (100 mg total) by mouth 3 (three) times daily.  Dispense: 90 capsule; Refill: 3 - ibuprofen (ADVIL) 600 MG tablet; Take 1 tablet (600 mg total) by mouth every 8 (eight) hours as needed.  Dispense: 30 tablet; Refill: 0 - CMP14+EGFR - CBC with Differential/Platelet  7. Primary osteoarthritis involving multiple joints - ibuprofen (ADVIL) 600 MG tablet; Take 1 tablet (600 mg total) by mouth every 8 (eight) hours as needed.  Dispense: 30 tablet; Refill: 0 - CMP14+EGFR - CBC with Differential/Platelet  8. Moderate episode of recurrent major depressive disorder (HCC) Start lexapro 10 mg  Stress management  RTO in 1 month - escitalopram  (LEXAPRO) 10 MG tablet; Take 1 tablet (10 mg total) by mouth daily.  Dispense: 90 tablet; Refill: 3 - CMP14+EGFR - CBC with Differential/Platelet  9. Gastroesophageal reflux disease without esophagitis - CMP14+EGFR - CBC with Differential/Platelet   Labs pending Health Maintenance reviewed Diet and exercise encouraged  Follow up plan: 1 month    [name], FNP

## 2022-12-02 LAB — URINE CULTURE: Organism ID, Bacteria: NO GROWTH

## 2022-12-18 DIAGNOSIS — J329 Chronic sinusitis, unspecified: Secondary | ICD-10-CM | POA: Diagnosis not present

## 2022-12-18 DIAGNOSIS — Z20822 Contact with and (suspected) exposure to covid-19: Secondary | ICD-10-CM | POA: Diagnosis not present

## 2022-12-18 DIAGNOSIS — R0981 Nasal congestion: Secondary | ICD-10-CM | POA: Diagnosis not present

## 2022-12-18 DIAGNOSIS — R059 Cough, unspecified: Secondary | ICD-10-CM | POA: Diagnosis not present

## 2022-12-21 ENCOUNTER — Encounter: Payer: Self-pay | Admitting: Family

## 2022-12-21 ENCOUNTER — Telehealth (INDEPENDENT_AMBULATORY_CARE_PROVIDER_SITE_OTHER): Payer: Medicare Other | Admitting: Family

## 2022-12-21 DIAGNOSIS — J019 Acute sinusitis, unspecified: Secondary | ICD-10-CM | POA: Diagnosis not present

## 2022-12-21 MED ORDER — PSEUDOEPHEDRINE HCL ER 120 MG PO TB12: 120.0000 mg | ORAL_TABLET | Freq: Two times a day (BID) | ORAL | 0 refills | Status: DC

## 2022-12-21 MED ORDER — DOXYCYCLINE HYCLATE 100 MG PO TABS: 100.0000 mg | ORAL_TABLET | Freq: Two times a day (BID) | ORAL | 0 refills | Status: DC

## 2022-12-21 NOTE — Progress Notes (Signed)
   Virtual Visit  Note Due to COVID-19 pandemic this visit was conducted virtually. This visit type was conducted due to national recommendations for restrictions regarding the COVID-19 Pandemic (e.g. social distancing, sheltering in place) in an effort to limit this patient's exposure and mitigate transmission in our community. All issues noted in this document were discussed and addressed.  A physical exam was not performed with this format.  I connected with Sandra Adams on 12/21/22 at 12:10 m by telephone and verified that I am speaking with the correct person using two identifiers. Sandra Adams is currently located at  home and no one is currently with her during visit. The provider, [name], FNP is located in their office at time of visit.  I discussed the limitations, risks, security and privacy concerns of performing an evaluation and management service by telephone and the availability of in person appointments. I also discussed with the patient that there may be a patient responsible charge related to this service. The patient expressed understanding and agreed to proceed.   History and Present Illness:  Sinusitis This is a new problem. The current episode started 1 to 4 weeks ago. The problem has been gradually worsening since onset. There has been no fever. Her pain is at a severity of 7/10. The pain is mild. Associated symptoms include congestion, coughing, headaches, sinus pressure and sneezing. Pertinent negatives include no chills or ear pain. Past treatments include oral decongestants. The treatment provided mild relief.      Review of Systems  Constitutional:  Negative for chills.  HENT:  Positive for congestion, sinus pressure and sneezing. Negative for ear pain.   Respiratory:  Positive for cough.   Neurological:  Positive for headaches.     Observations/Objective: No SOB or distress noted, hoarse voice  Assessment and Plan: 1. Acute sinusitis,  recurrence not specified, unspecified location - Take meds as prescribed - Use a cool mist humidifier  -Use saline nose sprays frequently -Force fluids -For any cough or congestion  Use plain Mucinex- regular strength or max strength is fine -For fever or aces or pains- take tylenol or ibuprofen. -Throat lozenges if help -Follow up if symptoms worsen or do not improve  - doxycycline (VIBRA-TABS) 100 MG tablet; Take 1 tablet (100 mg total) by mouth 2 (two) times daily.  Dispense: 20 tablet; Refill: 0 - pseudoephedrine (SUDAFED 12 HOUR) 120 MG 12 hr tablet; Take 1 tablet (120 mg total) by mouth 2 (two) times daily.  Dispense: 20 tablet; Refill: 0     I discussed the assessment and treatment plan with the patient. The patient was provided an opportunity to ask questions and all were answered. The patient agreed with the plan and demonstrated an understanding of the instructions.   The patient was advised to call back or seek an in-person evaluation if the symptoms worsen or if the condition fails to improve as anticipated.  The above assessment and management plan was discussed with the patient. The patient verbalized understanding of and has agreed to the management plan. Patient is aware to call the clinic if symptoms persist or worsen. Patient is aware when to return to the clinic for a follow-up visit. Patient educated on when it is appropriate to go to the emergency department.   Time call ended:  12:23 pm  I provided 13 minutes of  non face-to-face time during this encounter.    [name], FNP

## 2022-12-28 ENCOUNTER — Telehealth: Payer: Medicare Other | Admitting: Family

## 2023-01-07 DIAGNOSIS — R0981 Nasal congestion: Secondary | ICD-10-CM | POA: Diagnosis not present

## 2023-01-07 DIAGNOSIS — R0982 Postnasal drip: Secondary | ICD-10-CM | POA: Diagnosis not present

## 2023-01-20 ENCOUNTER — Ambulatory Visit: Payer: 59 | Admitting: Family

## 2023-01-21 ENCOUNTER — Encounter: Payer: Self-pay | Admitting: Family

## 2023-02-09 ENCOUNTER — Encounter: Payer: Self-pay | Admitting: Family Medicine

## 2023-02-09 ENCOUNTER — Ambulatory Visit (INDEPENDENT_AMBULATORY_CARE_PROVIDER_SITE_OTHER): Payer: 59 | Admitting: Family Medicine

## 2023-02-09 VITALS — BP 129/83 | HR 87 | Ht 62.0 in | Wt 142.0 lb

## 2023-02-09 DIAGNOSIS — Z87440 Personal history of urinary (tract) infections: Secondary | ICD-10-CM

## 2023-02-09 DIAGNOSIS — N898 Other specified noninflammatory disorders of vagina: Secondary | ICD-10-CM

## 2023-02-09 DIAGNOSIS — R109 Unspecified abdominal pain: Secondary | ICD-10-CM | POA: Diagnosis not present

## 2023-02-09 LAB — URINALYSIS
Bilirubin, UA: NEGATIVE
Glucose, UA: NEGATIVE
Ketones, UA: NEGATIVE
Leukocytes,UA: NEGATIVE
Nitrite, UA: NEGATIVE
Protein,UA: NEGATIVE
Specific Gravity, UA: >1.03 — ABNORMAL HIGH (ref 1.005–1.030)
Urobilinogen, Ur: 0.2 mg/dL (ref 0.2–1.0)
pH, UA: 5.5 (ref 5.0–7.5)

## 2023-02-09 MED ORDER — MICONAZOLE 3 200 MG VA SUPP: 200.0000 mg | Freq: Every day | VAGINAL | 0 refills | Status: DC

## 2023-02-09 NOTE — Progress Notes (Signed)
BP 129/83   Pulse 87   Ht 5' 2"$  (1.575 m)   Wt 142 lb (64.4 kg)   LMP 10/16/2014   SpO2 98%   BMI 25.97 kg/m    Subjective:   Patient ID: [name], female    DOB: 18-Aug-1966, 57 y.o.   MRN: [id number]  HPI: Sandra Adams is a 57 y.o. female presenting on 02/09/2023 for Abdominal Pain (Pelvic- would like urine to be checked for uti. Will schedule an app[facility] with [facility] for [facility].)   HPI Patient is coming in today complaining of lower abdominal pain with urinary odor and color change.  She says her urine is a lot darker.  She says she also has some vaginal itching and irritation but does not want to do pelvic exam today, wants to wait to see a female for that.  She does states she gets these frequent bladder irritations.  She has not seen a urologist in some time for this but was referred to 1 before.  Relevant past medical, surgical, family and social history reviewed and updated as indicated. Interim medical history since our last visit reviewed. Allergies and medications reviewed and updated.  Review of Systems  Constitutional:  Negative for chills and fever.  Eyes:  Negative for visual disturbance.  Respiratory:  Negative for chest tightness and shortness of breath.   Cardiovascular:  Negative for chest pain and leg swelling.  Gastrointestinal:  Negative for abdominal pain.  Genitourinary:  Positive for dysuria, frequency and urgency. Negative for difficulty urinating, hematuria, vaginal bleeding, vaginal discharge and vaginal pain.  Musculoskeletal:  Negative for back pain and gait problem.  Skin:  Negative for rash.  Neurological:  Negative for light-headedness and headaches.  Psychiatric/Behavioral:  Negative for agitation and behavioral problems.   All other systems reviewed and are negative.   Per HPI unless specifically indicated above   Allergies as of 02/09/2023       Reactions   Ciprofloxacin    Makes her feel weak,dizzy   Imitrex [sumatriptan]     Blurred vision   Prednisone Nausea And Vomiting   Tylenol [acetaminophen]    Tylenol #3,Headaches   Vicodin [hydrocodone-acetaminophen]    GI upset        Medication List        Accurate as of February 09, 2023 12:17 PM. If you have any questions, ask your nurse or doctor.          ALPRAZolam 0.25 MG tablet Commonly known as: XANAX Take 1 tablet (0.25 mg total) by mouth 2 (two) times daily as needed for anxiety.   doxycycline 100 MG tablet Commonly known as: VIBRA-TABS Take 1 tablet (100 mg total) by mouth 2 (two) times daily.   escitalopram 10 MG tablet Commonly known as: Lexapro Take 1 tablet (10 mg total) by mouth daily.   fluticasone 50 MCG/ACT nasal spray Commonly known as: FLONASE Place 2 sprays into both nostrils daily.   gabapentin 100 MG capsule Commonly known as: NEURONTIN Take 1 capsule (100 mg total) by mouth 3 (three) times daily.   ibuprofen 600 MG tablet Commonly known as: ADVIL Take 1 tablet (600 mg total) by mouth every 8 (eight) hours as needed.   Miconazole 3 200 MG vaginal suppository Generic drug: miconazole Place 1 suppository (200 mg total) vaginally at bedtime. Started by: Worthy Rancher, MD   pseudoephedrine 120 MG 12 hr tablet Commonly known as: Sudafed 12 Hour Take 1 tablet (120 mg total) by mouth  2 (two) times daily.         Objective:   BP 129/83   Pulse 87   Ht 5' 2"$  (1.575 m)   Wt 142 lb (64.4 kg)   LMP 10/16/2014   SpO2 98%   BMI 25.97 kg/m   Wt Readings from Last 3 Encounters:  02/09/23 142 lb (64.4 kg)  11/30/22 142 lb (64.4 kg)  09/15/22 141 lb (64 kg)    Physical Exam Vitals and nursing note reviewed.  Constitutional:      General: She is not in acute distress.    Appearance: Normal appearance. She is well-developed. She is not diaphoretic.  Eyes:     Conjunctiva/sclera: Conjunctivae normal.  Cardiovascular:     Rate and Rhythm: Normal rate and regular rhythm.     Heart sounds: Normal heart  sounds. No murmur heard. Pulmonary:     Effort: Pulmonary effort is normal. No respiratory distress.     Breath sounds: Normal breath sounds. No wheezing.  Abdominal:     General: Abdomen is flat. Bowel sounds are normal. There is no distension.     Palpations: Abdomen is soft.     Tenderness: There is abdominal tenderness in the suprapubic area. There is no right CVA tenderness, left CVA tenderness, guarding or rebound.  Skin:    General: Skin is warm and dry.     Findings: No rash.  Neurological:     Mental Status: She is alert and oriented to person, place, and time.     Coordination: Coordination normal.  Psychiatric:        Behavior: Behavior normal.       Assessment & Plan:   Problem List Items Addressed This Visit   None Visit Diagnoses     Abdominal pain, unspecified abdominal location    -  Primary   Relevant Orders   Urine Culture   Urinalysis   Ambulatory referral to Urology   History of frequent urinary tract infections       Relevant Orders   Ambulatory referral to Urology   Vaginal discharge       Relevant Medications   miconazole (MICONAZOLE 3) 200 MG vaginal suppository       Urinalysis: Trace blood, otherwise negative.  With frequent symptoms that she had like this, will refer her to urology.  Will also await culture and encouraged increased hydration.  Sent miconazole cream for possible vaginal irritation and inflammation Follow up plan: Return if symptoms worsen or fail to improve.  Counseling provided for all of the vaccine components Orders Placed This Encounter  Procedures   Urine Culture   Urinalysis   Ambulatory referral to Urology    Caryl Pina, MD [facility] 02/09/2023, 12:17 PM

## 2023-02-11 LAB — URINE CULTURE: Organism ID, Bacteria: NO GROWTH

## 2023-02-14 ENCOUNTER — Ambulatory Visit: Payer: 59 | Admitting: Family

## 2023-02-14 NOTE — Patient Instructions (Incomplete)
Our records indicate that you are due for your annual mammogram/breast imaging. While there is no way to prevent breast cancer, early detection provides the best opportunity for curing it. For women over the age of 65, the [facility] recommends a yearly clinical breast exam and a yearly mammogram. These practices have saved thousands of lives. We need your help to ensure that you are receiving optimal medical care. Please call the imaging location that has done you previous mammograms. Please remember to list [place] as your primary care. This helps make sure we receive a report and can update your chart.  Below is the contact information for several local breast imaging centers. You may call the location that works best for you, and they will be happy to assistance in making you an appointment. You do not need an order for a regular screening mammogram. However, if you are having any problems or concerns with you breast area, please let your primary care provider know, and appropriate orders will be placed. Please let our office know if you have any questions or concerns. Or if you need information for another imaging center not on this list or outside of the area. We are commented to working with you on your health care journey.   The mobile unit/bus ([facility]) - they come twice a month to our location.  These appointments can be made through our office or by call [facility] [place] [facility]  [place], [place] [address] Phone [phone]  [facility] Radiology Department  [address] [place], [place] [address] [phone]  [facility] ([facility] of [place])  [phone] [address], [place] [address] [phone]  [place] [address] [place] [address] [phone]  [place]  [address] [place] [address] [phone]  [facility] Mammography in [place], [place] [address] [phone]  [place] [facility] [place], [place] [address] [phone]  [facility] at [facility] [place]  [place], [place] [address] [phone]  [facility] [place] [address] [phone]

## 2023-02-15 ENCOUNTER — Encounter: Payer: Self-pay | Admitting: Family

## 2023-02-15 ENCOUNTER — Telehealth: Payer: Self-pay | Admitting: Family

## 2023-02-15 NOTE — Telephone Encounter (Signed)
Called patient to schedule Medicare Annual Wellness Visit (AWV). Left message for patient to call back and schedule Medicare Annual Wellness Visit (AWV).  Last date of AWV: 12/07/2021   Please schedule an appointment at any time with either Mickel Baas or [place], [facility]. .  If any questions, please contact me at [phone].  Thank you,  Colletta Maryland,  [place] Program Direct Dial ??[phone]

## 2023-03-03 ENCOUNTER — Other Ambulatory Visit: Payer: Self-pay | Admitting: Family

## 2023-03-03 ENCOUNTER — Ambulatory Visit (INDEPENDENT_AMBULATORY_CARE_PROVIDER_SITE_OTHER): Payer: 59 | Admitting: Family

## 2023-03-03 ENCOUNTER — Encounter: Payer: Self-pay | Admitting: Family

## 2023-03-03 VITALS — BP 137/85 | HR 83 | Temp 98.0°F | Ht 62.0 in | Wt 142.0 lb

## 2023-03-03 DIAGNOSIS — H6991 Unspecified Eustachian tube disorder, right ear: Secondary | ICD-10-CM

## 2023-03-03 DIAGNOSIS — M79644 Pain in right finger(s): Secondary | ICD-10-CM

## 2023-03-03 DIAGNOSIS — R300 Dysuria: Secondary | ICD-10-CM | POA: Diagnosis not present

## 2023-03-03 DIAGNOSIS — M159 Polyosteoarthritis, unspecified: Secondary | ICD-10-CM

## 2023-03-03 LAB — URINALYSIS, COMPLETE
Bilirubin, UA: NEGATIVE
Glucose, UA: NEGATIVE
Leukocytes,UA: NEGATIVE
Nitrite, UA: NEGATIVE
Protein,UA: NEGATIVE
Specific Gravity, UA: >1.03 — ABNORMAL HIGH (ref 1.005–1.030)
Urobilinogen, Ur: 0.2 mg/dL (ref 0.2–1.0)
pH, UA: 5.5 (ref 5.0–7.5)

## 2023-03-03 LAB — MICROSCOPIC EXAMINATION
Bacteria, UA: NONE SEEN
Renal Epithel, UA: NONE SEEN /hpf

## 2023-03-03 MED ORDER — CETIRIZINE HCL 10 MG PO TABS: 10.0000 mg | ORAL_TABLET | Freq: Every day | ORAL | 1 refills | Status: DC

## 2023-03-03 NOTE — Progress Notes (Signed)
Subjective:    Patient ID: [name], female    DOB: [birth year], 57 y.o.   MRN: [id number]  Chief Complaint  Patient presents with   Ear Pain    Right ear pressure    PT presents to the office today with right ear pressure that comes and goes.  Otalgia  There is pain in the right ear. This is a recurrent problem. The current episode started more than 1 month ago. The problem has been waxing and waning. There has been no fever. The pain is at a severity of 0/10. The patient is experiencing no pain. Pertinent negatives include no coughing, ear discharge, headaches, hearing loss, rhinorrhea or sore throat. Treatments tried: flonase. The treatment provided mild relief.  Urinary Frequency  This is a recurrent problem. The current episode started 1 to 4 weeks ago. The problem occurs intermittently. The pain is at a severity of 0/10. The patient is experiencing no pain. Associated symptoms include frequency. Pertinent negatives include no hematuria or urgency. She has tried increased fluids for the symptoms. The treatment provided mild relief.      Review of Systems  HENT:  Positive for ear pain. Negative for ear discharge, hearing loss, rhinorrhea and sore throat.   Respiratory:  Negative for cough.   Genitourinary:  Positive for frequency. Negative for hematuria and urgency.  Neurological:  Negative for headaches.  All other systems reviewed and are negative.      Objective:   Physical Exam Vitals reviewed.  Constitutional:      General: She is not in acute distress.    Appearance: She is well-developed.  HENT:     Head: Normocephalic and atraumatic.     Right Ear: Tympanic membrane normal.     Left Ear: Tympanic membrane normal.  Eyes:     Pupils: Pupils are equal, round, and reactive to light.  Neck:     Thyroid: No thyromegaly.  Cardiovascular:     Rate and Rhythm: Normal rate and regular rhythm.     Heart sounds: Normal heart sounds. No murmur heard. Pulmonary:      Effort: Pulmonary effort is normal. No respiratory distress.     Breath sounds: Normal breath sounds. No wheezing.  Abdominal:     General: Bowel sounds are normal. There is no distension.     Palpations: Abdomen is soft.     Tenderness: There is no abdominal tenderness.  Musculoskeletal:        General: No tenderness. Normal range of motion.     Cervical back: Normal range of motion and neck supple.  Skin:    General: Skin is warm and dry.  Neurological:     Mental Status: She is alert and oriented to person, place, and time.     Cranial Nerves: No cranial nerve deficit.     Deep Tendon Reflexes: Reflexes are normal and symmetric.  Psychiatric:        Behavior: Behavior normal.        Thought Content: Thought content normal.        Judgment: Judgment normal.        BP 137/85   Pulse 83   Temp 98 F (36.7 C) (Temporal)   Ht '5\' 2"'$  (1.575 m)   Wt 142 lb (64.4 kg)   LMP 10/16/2014   SpO2 97%   BMI 25.97 kg/m   Assessment & Plan:  RUCHOMA FURY comes in today with chief complaint of Ear Pain (Right ear pressure )  Diagnosis and orders addressed:  1. Dysuria No infection, keep Urologists follow up - Urine Culture - Urinalysis, Complete  2. Eustachian tube dysfunction, right Start zyrtec Continue flonase Nasal decongestant  Referral to ENT pending  Follow up if symptoms worsen or do not improve  - cetirizine (ZYRTEC ALLERGY) 10 MG tablet; Take 1 tablet (10 mg total) by mouth daily.  Dispense: 90 tablet; Refill: 1 - Ambulatory referral to ENT     [name], FNP

## 2023-03-03 NOTE — Patient Instructions (Signed)
Eustachian Tube Dysfunction  Eustachian tube dysfunction refers to a condition in which a blockage develops in the narrow passage that connects the middle ear to the back of the nose (eustachian tube). The eustachian tube regulates air pressure in the middle ear by letting air move between the ear and nose. It also helps to drain fluid from the middle ear space. Eustachian tube dysfunction can affect one or both ears. When the eustachian tube does not function properly, air pressure, fluid, or both can build up in the middle ear. What are the causes? This condition occurs when the eustachian tube becomes blocked or cannot open normally. Common causes of this condition include: Ear infections. Colds and other infections that affect the nose, mouth, and throat (upper respiratory tract). Allergies. Irritation from cigarette smoke. Irritation from stomach acid coming up into the esophagus (gastroesophageal reflux). The esophagus is the part of the body that moves food from the mouth to the stomach. Sudden changes in air pressure, such as from descending in an airplane or scuba diving. Abnormal growths in the nose or throat, such as: Growths that line the nose (nasal polyps). Abnormal growth of cells (tumors). Enlarged tissue at the back of the throat (adenoids). What increases the risk? You are more likely to develop this condition if: You smoke. You are overweight. You are a child who has: Certain birth defects of the mouth, such as cleft palate. Large tonsils or adenoids. What are the signs or symptoms? Common symptoms of this condition include: A feeling of fullness in the ear. Ear pain. Clicking or popping noises in the ear. Ringing in the ear (tinnitus). Hearing loss. Loss of balance. Dizziness. Symptoms may get worse when the air pressure around you changes, such as when you travel to an area of high elevation, fly on an airplane, or go scuba diving. How is this diagnosed? This  condition may be diagnosed based on: Your symptoms. A physical exam of your ears, nose, and throat. Tests, such as those that measure: The movement of your eardrum. Your hearing (audiometry). How is this treated? Treatment depends on the cause and severity of your condition. In mild cases, you may relieve your symptoms by moving air into your ears. This is called "popping the ears." In more severe cases, or if you have symptoms of fluid in your ears, treatment may include: Medicines to relieve congestion (decongestants). Medicines that treat allergies (antihistamines). Nasal sprays or ear drops that contain medicines that reduce swelling (steroids). A procedure to drain the fluid in your eardrum. In this procedure, a small tube may be placed in the eardrum to: Drain the fluid. Restore the air in the middle ear space. A procedure to insert a balloon device through the nose to inflate the opening of the eustachian tube (balloon dilation). Follow these instructions at home: Lifestyle Do not do any of the following until your health care provider approves: Travel to high altitudes. Fly in airplanes. Work in a pressurized cabin or room. Scuba dive. Do not use any products that contain nicotine or tobacco. These products include cigarettes, chewing tobacco, and vaping devices, such as e-cigarettes. If you need help quitting, ask your health care provider. Keep your ears dry. Wear fitted earplugs during showering and bathing. Dry your ears completely after. General instructions Take over-the-counter and prescription medicines only as told by your health care provider. Use techniques to help pop your ears as recommended by your health care provider. These may include: Chewing gum. Yawning. Frequent, forceful swallowing.   Closing your mouth, holding your nose closed, and gently blowing as if you are trying to blow air out of your nose. Keep all follow-up visits. This is important. Contact a  health care provider if: Your symptoms do not go away after treatment. Your symptoms come back after treatment. You are unable to pop your ears. You have: A fever. Pain in your ear. Pain in your head or neck. Fluid draining from your ear. Your hearing suddenly changes. You become very dizzy. You lose your balance. Get help right away if: You have a sudden, severe increase in any of your symptoms. Summary Eustachian tube dysfunction refers to a condition in which a blockage develops in the eustachian tube. It can be caused by ear infections, allergies, inhaled irritants, or abnormal growths in the nose or throat. Symptoms may include ear pain or fullness, hearing loss, or ringing in the ears. Mild cases are treated with techniques to unblock the ears, such as yawning or chewing gum. More severe cases are treated with medicines or procedures. This information is not intended to replace advice given to you by your health care provider. Make sure you discuss any questions you have with your health care provider. Document Revised: 02/16/2021 Document Reviewed: 02/16/2021 Elsevier Patient Education  2023 Elsevier Inc.  

## 2023-03-05 LAB — URINE CULTURE: Organism ID, Bacteria: NO GROWTH

## 2023-03-25 ENCOUNTER — Telehealth: Payer: Self-pay | Admitting: Family

## 2023-03-25 NOTE — Telephone Encounter (Signed)
Contacted Sandra Adams to schedule their annual wellness visit. Appointment made for 04/05/2023.  Thank you,  Judeth Cornfield,  AMB Clinical Support [facility] AWV Program Direct Dial ??[phone]

## 2023-03-28 ENCOUNTER — Ambulatory Visit: Payer: 59 | Admitting: Urology

## 2023-03-28 DIAGNOSIS — N390 Urinary tract infection, site not specified: Secondary | ICD-10-CM

## 2023-03-31 ENCOUNTER — Ambulatory Visit: Payer: 59 | Admitting: Family

## 2023-04-05 ENCOUNTER — Ambulatory Visit (INDEPENDENT_AMBULATORY_CARE_PROVIDER_SITE_OTHER): Payer: 59

## 2023-04-05 VITALS — Ht 62.0 in | Wt 142.0 lb

## 2023-04-05 DIAGNOSIS — Z0000 Encounter for general adult medical examination without abnormal findings: Secondary | ICD-10-CM | POA: Diagnosis not present

## 2023-04-05 NOTE — Progress Notes (Signed)
Subjective:   Sandra Adams is a 57 y.o. female who presents for Medicare Annual (Subsequent) preventive examination. I connected with  Antonieta Loveless on 04/05/23 by a audio enabled telemedicine application and verified that I am speaking with the correct person using two identifiers.  Patient Location: Home  Provider Location: Home Office  I discussed the limitations of evaluation and management by telemedicine. The patient expressed understanding and agreed to proceed.  Review of Systems     Cardiac Risk Factors include: advanced age (>64men, >52 women);hypertension;dyslipidemia     Objective:    Today's Vitals   04/05/23 1422  Weight: 142 lb (64.4 kg)  Height: 5\' 2"  (1.575 m)   Body mass index is 25.97 kg/m.     04/05/2023    2:29 PM 12/07/2021   11:40 AM 08/16/2019    2:43 PM 04/22/2017   11:44 AM 06/14/2016    3:18 PM 05/09/2015   10:36 AM  Advanced Directives  Does Patient Have a Medical Advance Directive? No No No No No No  Would patient like information on creating a medical advance directive? No - Patient declined No - Patient declined No - Patient declined Yes (MAU/Ambulatory/Procedural Areas - Information given)  No - patient declined information    Current Medications (verified) Outpatient Encounter Medications as of 04/05/2023  Medication Sig   ALPRAZolam (XANAX) 0.25 MG tablet Take 1 tablet (0.25 mg total) by mouth 2 (two) times daily as needed for anxiety.   cetirizine (ZYRTEC ALLERGY) 10 MG tablet Take 1 tablet (10 mg total) by mouth daily.   escitalopram (LEXAPRO) 10 MG tablet Take 1 tablet (10 mg total) by mouth daily.   fluticasone (FLONASE) 50 MCG/ACT nasal spray Place 2 sprays into both nostrils daily.   gabapentin (NEURONTIN) 100 MG capsule Take 1 capsule (100 mg total) by mouth 3 (three) times daily.   ibuprofen (ADVIL) 600 MG tablet TAKE 1 TABLET BY MOUTH EVERY 8 HOURS AS NEEDED.   pseudoephedrine (SUDAFED 12 HOUR) 120 MG 12 hr tablet Take 1  tablet (120 mg total) by mouth 2 (two) times daily.   No facility-administered encounter medications on file as of 04/05/2023.    Allergies (verified) Ciprofloxacin, Imitrex [sumatriptan], Penicillins, Prednisone, Tylenol [acetaminophen], Vicodin [hydrocodone-acetaminophen], and Promethazine   History: Past Medical History:  Diagnosis Date   Anxiety    Arthritis    Depression    Menopausal symptom    Migraine    Scoliosis    Past Surgical History:  Procedure Laterality Date   KNEE SURGERY Left 1974 and 1979   Family History  Problem Relation Age of Onset   Epilepsy Mother    COPD Father    Hyperlipidemia Father    Heart disease Father    Cancer Father    COPD Sister    Anxiety disorder Sister    COPD Maternal Aunt    Diabetes Maternal Uncle    Diabetes Maternal Aunt        x2   Colon cancer Neg Hx    Colon polyps Neg Hx    Esophageal cancer Neg Hx    Gallbladder disease Neg Hx    Social History   Socioeconomic History   Marital status: Divorced    Spouse name: Not on file   Number of children: 0   Years of education: 12th   Highest education level: High school graduate  Occupational History   Occupation: [employer]    Comment: Back : [employer] Knee surgery   Tobacco  Use   Smoking status: Never   Smokeless tobacco: Never  Vaping Use   Vaping Use: Never used  Substance and Sexual Activity   Alcohol use: No   Drug use: No   Sexual activity: Not Currently    Partners: Male    Birth control/protection: None  Other Topics Concern   Not on file  Social History Narrative   Single, no children. Right handed. 12 th grade. No caffeine.   Divorced since 2004.   Social Determinants of Health   Financial Resource Strain: Low Risk  (04/05/2023)   Overall Financial Resource Strain (CARDIA)    Difficulty of Paying Living Expenses: Not hard at all  Food Insecurity: No Food Insecurity (04/05/2023)   Hunger Vital Sign    Worried About Running Out of Food in the Last  Year: Never true    Ran Out of Food in the Last Year: Never true  Transportation Needs: No Transportation Needs (04/05/2023)   PRAPARE - Administrator, Civil Service (Medical): No    Lack of Transportation (Non-Medical): No  Physical Activity: Insufficiently Active (04/05/2023)   Exercise Vital Sign    Days of Exercise per Week: 3 days    Minutes of Exercise per Session: 30 min  Stress: No Stress Concern Present (04/05/2023)   [employer] of Occupational Health - Occupational Stress Questionnaire    Feeling of Stress : Not at all  Social Connections: Moderately Isolated (04/05/2023)   Social Connection and Isolation Panel [NHANES]    Frequency of Communication with Friends and Family: More than three times a week    Frequency of Social Gatherings with Friends and Family: More than three times a week    Attends Religious Services: More than 4 times per year    Active Member of [employer] or Organizations: No    Attends Engineer, structural: Never    Marital Status: Divorced    Tobacco Counseling Counseling given: Not Answered   Clinical Intake:  Pre-visit preparation completed: Yes  Pain : No/denies pain     Nutritional Risks: None Diabetes: No  How often do you need to have someone help you when you read instructions, pamphlets, or other written materials from your doctor or pharmacy?: 1 - Never  Diabetic?no   Interpreter Needed?: No  Information entered by :: Renie Ora, LPN   Activities of Daily Living    04/05/2023    2:29 PM  In your present state of health, do you have any difficulty performing the following activities:  Hearing? 0  Vision? 0  Difficulty concentrating or making decisions? 0  Walking or climbing stairs? 0  Dressing or bathing? 0  Doing errands, shopping? 0  Preparing Food and eating ? N  Using the Toilet? N  In the past six months, have you accidently leaked urine? N  Do you have problems with loss of bowel control?  N  Managing your Medications? N  Managing your Finances? N  Housekeeping or managing your Housekeeping? N    Patient Care Team: Junie Spencer, FNP as PCP - General (Family Medicine) Tilda Burrow, MD (Inactive) as Consulting Physician (Obstetrics and Gynecology) Isaiah Blakes, LCSW as [facility] Care Management (Licensed Clinical Social Worker)  Indicate any recent Medical Services you may have received from other than [facility] providers in the past year (date may be approximate).     Assessment:   This is a routine wellness examination for Lutricia.  Hearing/Vision screen Vision Screening -  Comments:: Wears rx glasses - up to date with routine eye exams with  Dr.Johnson   Dietary issues and exercise activities discussed: Current Exercise Habits: Home exercise routine, Type of exercise: walking, Time (Minutes): 30, Frequency (Times/Week): 3, Weekly Exercise (Minutes/Week): 90, Intensity: Mild, Exercise limited by: None identified   Goals Addressed             This Visit's Progress    awv   On track    08/16/2019 AWV Goal: Exercise for General Health  Patient will verbalize understanding of the benefits of increased physical activity: Exercising regularly is important. It will improve your overall fitness, flexibility, and endurance. Regular exercise also will improve your overall health. It can help you control your weight, reduce stress, and improve your bone density. Over the next year, patient will increase physical activity as tolerated with a goal of at least 150 minutes of moderate physical activity per week.  You can tell that you are exercising at a moderate intensity if your heart starts beating faster and you start breathing faster but can still hold a conversation. Moderate-intensity exercise ideas include: Walking 1 mile (1.6 km) in about 15 minutes Biking Hiking Golfing Dancing Water aerobics Patient will verbalize understanding of  everyday activities that increase physical activity by providing examples like the following: Yard work, such as: Insurance underwriter Gardening Washing windows or floors Patient will be able to explain general safety guidelines for exercising:  Before you start a new exercise program, talk with your health care provider. Do not exercise so much that you hurt yourself, feel dizzy, or get very short of breath. Wear comfortable clothes and wear shoes with good support. Drink plenty of water while you exercise to prevent dehydration or heat stroke. Work out until your breathing and your heartbeat get faster.        Depression Screen    04/05/2023    2:27 PM 03/03/2023    3:41 PM 02/09/2023   11:30 AM 09/15/2022    2:48 PM 08/17/2022    2:03 PM 07/20/2022   12:30 PM 05/20/2022   10:59 AM  PHQ 2/9 Scores  PHQ - 2 Score 0 0 1 1 0 0 0  PHQ- 9 Score 0 3  3   0    Fall Risk    04/05/2023    2:25 PM 03/03/2023    3:41 PM 02/09/2023   11:30 AM 07/20/2022   12:30 PM 05/20/2022   10:59 AM  Fall Risk   Falls in the past year? 0 0 0 0 0  Number falls in past yr: 0      Injury with Fall? 0      Risk for fall due to : No Fall Risks      Follow up Falls prevention discussed        FALL RISK PREVENTION PERTAINING TO THE HOME:  Any stairs in or around the home? No  If so, are there any without handrails? No  Home free of loose throw rugs in walkways, pet beds, electrical cords, etc? Yes  Adequate lighting in your home to reduce risk of falls? Yes   ASSISTIVE DEVICES UTILIZED TO PREVENT FALLS:  Life alert? No  Use of a cane, walker or w/c? No  Grab bars in the bathroom? No  Shower chair or bench in shower? No  Elevated toilet seat or a handicapped toilet? No  04/22/2017   11:03 AM 05/09/2015   10:48 AM  MMSE - Mini Mental State Exam  Not completed:  Refused  Orientation to time 5 5  Orientation to Place  5 5  Registration 3 3  Attention/ Calculation 3 5  Recall 3 2  Language- name 2 objects 2 2  Language- repeat 1 1  Language- follow 3 step command 1 3  Language- read & follow direction 1 1  Write a sentence 1 1  Copy design 1 1  Total score 26 29        04/05/2023    2:29 PM 12/07/2021   11:48 AM 08/16/2019    2:51 PM  6CIT Screen  What Year? 0 points 0 points 0 points  What month? 0 points 0 points 0 points  What time? 0 points 0 points 0 points  Count back from 20 0 points 0 points 0 points  Months in reverse 0 points 2 points 0 points  Repeat phrase 0 points 2 points 0 points  Total Score 0 points 4 points 0 points    Immunizations Immunization History  Administered Date(s) Administered   Tdap 12/21/2007    TDAP status: Due, Education has been provided regarding the importance of this vaccine. Advised may receive this vaccine at local pharmacy or Health Dept. Aware to provide a copy of the vaccination record if obtained from local pharmacy or Health Dept. Verbalized acceptance and understanding.  Flu Vaccine status: Declined, Education has been provided regarding the importance of this vaccine but patient still declined. Advised may receive this vaccine at local pharmacy or Health Dept. Aware to provide a copy of the vaccination record if obtained from local pharmacy or Health Dept. Verbalized acceptance and understanding.  Pneumococcal vaccine status: Declined,  Education has been provided regarding the importance of this vaccine but patient still declined. Advised may receive this vaccine at local pharmacy or Health Dept. Aware to provide a copy of the vaccination record if obtained from local pharmacy or Health Dept. Verbalized acceptance and understanding.   Covid-19 vaccine status: Declined, Education has been provided regarding the importance of this vaccine but patient still declined. Advised may receive this vaccine at local pharmacy or Health Dept.or vaccine clinic.  Aware to provide a copy of the vaccination record if obtained from local pharmacy or Health Dept. Verbalized acceptance and understanding.  Qualifies for Shingles Vaccine? No   Zostavax completed No   Shingrix Completed?: No.    Education has been provided regarding the importance of this vaccine. Patient has been advised to call insurance company to determine out of pocket expense if they have not yet received this vaccine. Advised may also receive vaccine at local pharmacy or Health Dept. Verbalized acceptance and understanding.  Screening Tests Health Maintenance  Topic Date Due   COVID-19 Vaccine (1) Never done   Zoster Vaccines- Shingrix (1 of 2) Never done   DTaP/Tdap/Td (2 - Td or Tdap) 12/20/2017   MAMMOGRAM  06/24/2022   INFLUENZA VACCINE  07/21/2023   Medicare Annual Wellness (AWV)  04/04/2024   PAP SMEAR-Modifier  04/16/2024   DEXA SCAN  05/20/2024   Fecal DNA (Cologuard)  12/05/2024   Hepatitis C Screening  Completed   HIV Screening  Completed   HPV VACCINES  Aged Out    Health Maintenance  Health Maintenance Due  Topic Date Due   COVID-19 Vaccine (1) Never done   Zoster Vaccines- Shingrix (1 of 2) Never done   DTaP/Tdap/Td (2 - Td  or Tdap) 12/20/2017   MAMMOGRAM  06/24/2022    Colorectal cancer screening: Type of screening: Cologuard. Completed 12/05/2021. Repeat every 3 years  Mammogram status: Ordered schedule 07/06/2023. Pt provided with contact info and advised to call to schedule appt.   Bone Density status: Ordered not of age . Pt provided with contact info and advised to call to schedule appt.  Lung Cancer Screening: (Low Dose CT Chest recommended if Age 11-80 years, 30 pack-year currently smoking OR have quit w/in 15years.) does not qualify.   Lung Cancer Screening Referral: n/a  Additional Screening:  Hepatitis C Screening: does not qualify; Completed 03/11/2015  Vision Screening: Recommended annual ophthalmology exams for early detection of  glaucoma and other disorders of the eye. Is the patient up to date with their annual eye exam?  Yes  Who is the provider or what is the name of the office in which the patient attends annual eye exams? Dr.Johnson  If pt is not established with a provider, would they like to be referred to a provider to establish care? No .   Dental Screening: Recommended annual dental exams for proper oral hygiene  Community Resource Referral / Chronic Care Management: CRR required this visit?  No   CCM required this visit?  No      Plan:     I have personally reviewed and noted the following in the patient's chart:   Medical and social history Use of alcohol, tobacco or illicit drugs  Current medications and supplements including opioid prescriptions. Patient is not currently taking opioid prescriptions. Functional ability and status Nutritional status Physical activity Advanced directives List of other physicians Hospitalizations, surgeries, and ER visits in previous 12 months Vitals Screenings to include cognitive, depression, and falls Referrals and appointments  In addition, I have reviewed and discussed with patient certain preventive protocols, quality metrics, and best practice recommendations. A written personalized care plan for preventive services as well as general preventive health recommendations were provided to patient.     Lorrene Reid, LPN   1/61/0960   Nurse Notes: Patient declines all vaccine

## 2023-04-05 NOTE — Patient Instructions (Signed)
Sandra Adams , Thank you for taking time to come for your Medicare Wellness Visit. I appreciate your ongoing commitment to your health goals. Please review the following plan we discussed and let me know if I can assist you in the future.   These are the goals we discussed:  Goals      awv     08/16/2019 AWV Goal: Exercise for General Health  Patient will verbalize understanding of the benefits of increased physical activity: Exercising regularly is important. It will improve your overall fitness, flexibility, and endurance. Regular exercise also will improve your overall health. It can help you control your weight, reduce stress, and improve your bone density. Over the next year, patient will increase physical activity as tolerated with a goal of at least 150 minutes of moderate physical activity per week.  You can tell that you are exercising at a moderate intensity if your heart starts beating faster and you start breathing faster but can still hold a conversation. Moderate-intensity exercise ideas include: Walking 1 mile (1.6 km) in about 15 minutes Biking Hiking Golfing Dancing Water aerobics Patient will verbalize understanding of everyday activities that increase physical activity by providing examples like the following: Yard work, such as: Insurance underwriter Gardening Washing windows or floors Patient will be able to explain general safety guidelines for exercising:  Before you start a new exercise program, talk with your health care provider. Do not exercise so much that you hurt yourself, feel dizzy, or get very short of breath. Wear comfortable clothes and wear shoes with good support. Drink plenty of water while you exercise to prevent dehydration or heat stroke. Work out until your breathing and your heartbeat get faster.      Reduce fast food intake        This is a list of the  screening recommended for you and due dates:  Health Maintenance  Topic Date Due   COVID-19 Vaccine (1) Never done   Zoster (Shingles) Vaccine (1 of 2) Never done   DTaP/Tdap/Td vaccine (2 - Td or Tdap) 12/20/2017   Mammogram  06/24/2022   Flu Shot  07/21/2023   Medicare Annual Wellness Visit  04/04/2024   Pap Smear  04/16/2024   DEXA scan (bone density measurement)  05/20/2024   Cologuard (Stool DNA test)  12/05/2024   Hepatitis C Screening: USPSTF Recommendation to screen - Ages 18-79 yo.  Completed   HIV Screening  Completed   HPV Vaccine  Aged Out    Advanced directives: Advance directive discussed with you today. I have provided a copy for you to complete at home and have notarized. Once this is complete please bring a copy in to our office so we can scan it into your chart.   Conditions/risks identified: Aim for 30 minutes of exercise or brisk walking, 6-8 glasses of water, and 5 servings of fruits and vegetables each day.   Next appointment: Follow up in one year for your annual wellness visit.   Preventive Care 40-64 Years, Female Preventive care refers to lifestyle choices and visits with your health care provider that can promote health and wellness. What does preventive care include? A yearly physical exam. This is also called an annual well check. Dental exams once or twice a year. Routine eye exams. Ask your health care provider how often you should have your eyes checked. Personal lifestyle choices, including: Daily care of your teeth  and gums. Regular physical activity. Eating a healthy diet. Avoiding tobacco and drug use. Limiting alcohol use. Practicing safe sex. Taking low-dose aspirin daily starting at age 77. Taking vitamin and mineral supplements as recommended by your health care provider. What happens during an annual well check? The services and screenings done by your health care provider during your annual well check will depend on your age, overall  health, lifestyle risk factors, and family history of disease. Counseling  Your health care provider may ask you questions about your: Alcohol use. Tobacco use. Drug use. Emotional well-being. Home and relationship well-being. Sexual activity. Eating habits. Work and work Astronomer. Method of birth control. Menstrual cycle. Pregnancy history. Screening  You may have the following tests or measurements: Height, weight, and BMI. Blood pressure. Lipid and cholesterol levels. These may be checked every 5 years, or more frequently if you are over 18 years old. Skin check. Lung cancer screening. You may have this screening every year starting at age 60 if you have a 30-pack-year history of smoking and currently smoke or have quit within the past 15 years. Fecal occult blood test (FOBT) of the stool. You may have this test every year starting at age 69. Flexible sigmoidoscopy or colonoscopy. You may have a sigmoidoscopy every 5 years or a colonoscopy every 10 years starting at age 34. Hepatitis C blood test. Hepatitis B blood test. Sexually transmitted disease (STD) testing. Diabetes screening. This is done by checking your blood sugar (glucose) after you have not eaten for a while (fasting). You may have this done every 1-3 years. Mammogram. This may be done every 1-2 years. Talk to your health care provider about when you should start having regular mammograms. This may depend on whether you have a family history of breast cancer. BRCA-related cancer screening. This may be done if you have a family history of breast, ovarian, tubal, or peritoneal cancers. Pelvic exam and Pap test. This may be done every 3 years starting at age 63. Starting at age 67, this may be done every 5 years if you have a Pap test in combination with an HPV test. Bone density scan. This is done to screen for osteoporosis. You may have this scan if you are at high risk for osteoporosis. Discuss your test results,  treatment options, and if necessary, the need for more tests with your health care provider. Vaccines  Your health care provider may recommend certain vaccines, such as: Influenza vaccine. This is recommended every year. Tetanus, diphtheria, and acellular pertussis (Tdap, Td) vaccine. You may need a Td booster every 10 years. Zoster vaccine. You may need this after age 56. Pneumococcal 13-valent conjugate (PCV13) vaccine. You may need this if you have certain conditions and were not previously vaccinated. Pneumococcal polysaccharide (PPSV23) vaccine. You may need one or two doses if you smoke cigarettes or if you have certain conditions. Talk to your health care provider about which screenings and vaccines you need and how often you need them. This information is not intended to replace advice given to you by your health care provider. Make sure you discuss any questions you have with your health care provider. Document Released: 01/02/2016 Document Revised: 08/25/2016 Document Reviewed: 10/07/2015 Elsevier Interactive Patient Education  2017 [employer].    Fall Prevention in the Home Falls can cause injuries. They can happen to people of all ages. There are many things you can do to make your home safe and to help prevent falls. What can I do  on the outside of my home? Regularly fix the edges of walkways and driveways and fix any cracks. Remove anything that might make you trip as you walk through a door, such as a raised step or threshold. Trim any bushes or trees on the path to your home. Use bright outdoor lighting. Clear any walking paths of anything that might make someone trip, such as rocks or tools. Regularly check to see if handrails are loose or broken. Make sure that both sides of any steps have handrails. Any raised decks and porches should have guardrails on the edges. Have any leaves, snow, or ice cleared regularly. Use sand or salt on walking paths during winter. Clean  up any spills in your garage right away. This includes oil or grease spills. What can I do in the bathroom? Use night lights. Install grab bars by the toilet and in the tub and shower. Do not use towel bars as grab bars. Use non-skid mats or decals in the tub or shower. If you need to sit down in the shower, use a plastic, non-slip stool. Keep the floor dry. Clean up any water that spills on the floor as soon as it happens. Remove soap buildup in the tub or shower regularly. Attach bath mats securely with double-sided non-slip rug tape. Do not have throw rugs and other things on the floor that can make you trip. What can I do in the bedroom? Use night lights. Make sure that you have a light by your bed that is easy to reach. Do not use any sheets or blankets that are too big for your bed. They should not hang down onto the floor. Have a firm chair that has side arms. You can use this for support while you get dressed. Do not have throw rugs and other things on the floor that can make you trip. What can I do in the kitchen? Clean up any spills right away. Avoid walking on wet floors. Keep items that you use a lot in easy-to-reach places. If you need to reach something above you, use a strong step stool that has a grab bar. Keep electrical cords out of the way. Do not use floor polish or wax that makes floors slippery. If you must use wax, use non-skid floor wax. Do not have throw rugs and other things on the floor that can make you trip. What can I do with my stairs? Do not leave any items on the stairs. Make sure that there are handrails on both sides of the stairs and use them. Fix handrails that are broken or loose. Make sure that handrails are as long as the stairways. Check any carpeting to make sure that it is firmly attached to the stairs. Fix any carpet that is loose or worn. Avoid having throw rugs at the top or bottom of the stairs. If you do have throw rugs, attach them to the  floor with carpet tape. Make sure that you have a light switch at the top of the stairs and the bottom of the stairs. If you do not have them, ask someone to add them for you. What else can I do to help prevent falls? Wear shoes that: Do not have high heels. Have rubber bottoms. Are comfortable and fit you well. Are closed at the toe. Do not wear sandals. If you use a stepladder: Make sure that it is fully opened. Do not climb a closed stepladder. Make sure that both sides of the stepladder are  locked into place. Ask someone to hold it for you, if possible. Clearly mark and make sure that you can see: Any grab bars or handrails. First and last steps. Where the edge of each step is. Use tools that help you move around (mobility aids) if they are needed. These include: Canes. Walkers. Scooters. Crutches. Turn on the lights when you go into a dark area. Replace any light bulbs as soon as they burn out. Set up your furniture so you have a clear path. Avoid moving your furniture around. If any of your floors are uneven, fix them. If there are any pets around you, be aware of where they are. Review your medicines with your doctor. Some medicines can make you feel dizzy. This can increase your chance of falling. Ask your doctor what other things that you can do to help prevent falls. This information is not intended to replace advice given to you by your health care provider. Make sure you discuss any questions you have with your health care provider. Document Released: 10/02/2009 Document Revised: 05/13/2016 Document Reviewed: 01/10/2015 Elsevier Interactive Patient Education  2017 [employer].

## 2023-04-15 ENCOUNTER — Ambulatory Visit: Payer: 59 | Admitting: Family

## 2023-05-02 ENCOUNTER — Ambulatory Visit (INDEPENDENT_AMBULATORY_CARE_PROVIDER_SITE_OTHER): Payer: 59 | Admitting: Nurse Practitioner

## 2023-05-02 ENCOUNTER — Encounter: Payer: Self-pay | Admitting: Nurse Practitioner

## 2023-05-02 VITALS — BP 138/87 | HR 88 | Ht 62.0 in | Wt 147.0 lb

## 2023-05-02 DIAGNOSIS — N390 Urinary tract infection, site not specified: Secondary | ICD-10-CM | POA: Diagnosis not present

## 2023-05-02 DIAGNOSIS — R519 Headache, unspecified: Secondary | ICD-10-CM

## 2023-05-02 DIAGNOSIS — Z87440 Personal history of urinary (tract) infections: Secondary | ICD-10-CM | POA: Diagnosis not present

## 2023-05-02 LAB — URINALYSIS
Bilirubin, UA: NEGATIVE
Glucose, UA: NEGATIVE
Leukocytes,UA: NEGATIVE
Nitrite, UA: NEGATIVE
Protein,UA: NEGATIVE
Specific Gravity, UA: >1.03 — ABNORMAL HIGH (ref 1.005–1.030)
Urobilinogen, Ur: 0.2 mg/dL (ref 0.2–1.0)
pH, UA: 5.5 (ref 5.0–7.5)

## 2023-05-02 MED ORDER — NAPROXEN 500 MG PO TABS
500.0000 mg | ORAL_TABLET | Freq: Two times a day (BID) | ORAL | 1 refills | Status: DC
Start: 2023-05-02 — End: 2023-07-01

## 2023-05-02 NOTE — Patient Instructions (Signed)
General Headache Without Cause A headache is pain or discomfort you feel around the head or neck area. There are many causes and types of headaches. In some cases, the cause may not be found. Follow these instructions at home: Watch your condition for any changes. Let your doctor know about them. Take these steps to help with your condition: Managing pain     Take over-the-counter and prescription medicines only as told by your doctor. This includes medicines for pain that are taken by mouth or put on the skin. Lie down in a dark, quiet room when you have a headache. If told, put ice on your head and neck area: Put ice in a plastic bag. Place a towel between your skin and the bag. Leave the ice on for 20 minutes, 2-3 times per day. Take off the ice if your skin turns bright red. This is very important. If you cannot feel pain, heat, or cold, you have a greater risk of damage to the area. If told, put heat on the affected area. Use the heat source that your doctor recommends, such as a moist heat pack or a heating pad. Place a towel between your skin and the heat source. Leave the heat on for 20-30 minutes. Take off the heat if your skin turns bright red. This is very important. If you cannot feel pain, heat, or cold, you have a greater risk of getting burned. Keep lights dim if bright lights bother you or make your headaches worse. Eating and drinking Eat meals on a regular schedule. If you drink alcohol: Limit how much you have to: 0-1 drink a day for women who are not pregnant. 0-2 drinks a day for men. Know how much alcohol is in a drink. In the [place] one drink equals one 12 oz bottle of beer (355 mL), one 5 oz glass of wine (148 mL), or one 1 oz glass of hard liquor (44 mL). Stop drinking caffeine, or drink less caffeine. General instructions  Keep a journal to find out if certain things bring on headaches. For example, write down: What you eat and drink. How much sleep you  get. Any change to your diet or medicines. Get a massage or try other ways to relax. Limit stress. Sit up straight. Do not tighten (tense) your muscles. Do not smoke or use any products that contain nicotine or tobacco. If you need help quitting, ask your doctor. Exercise regularly as told by your doctor. Get enough sleep. This often means 7-9 hours of sleep each night. Keep all follow-up visits. This is important. Contact a doctor if: Medicine does not help your symptoms. You have a headache that feels different than the other headaches. You feel like you may vomit (nauseous) or you vomit. You have a fever. Get help right away if: Your headache: Gets very bad quickly. Gets worse after a lot of physical activity. You have any of these symptoms: You continue to vomit. A stiff neck. Trouble seeing. Your eye or ear hurts. Trouble speaking. Weak muscles or you lose muscle control. You lose your balance or have trouble walking. You feel like you will pass out (faint) or you pass out. You are mixed up (confused). You have a seizure. These symptoms may be an emergency. Get help right away. Call your local emergency services (911 in the [place] Do not wait to see if the symptoms will go away. Do not drive yourself to the hospital. Summary A headache is pain or discomfort that   is felt around the head or neck area. There are many causes and types of headaches. In some cases, the cause may not be found. Keep a journal to help find out what causes your headaches. Watch your condition for any changes. Let your doctor know about them. Contact a doctor if you have a headache that is different from usual, or if medicine does not help your headache. Get help right away if your headache gets very bad, you throw up, you have trouble seeing, you lose your balance, or you have a seizure. This information is not intended to replace advice given to you by your health care provider. Make sure you  discuss any questions you have with your health care provider. Document Revised: 05/06/2021 Document Reviewed: 05/06/2021 Elsevier Patient Education  2023 Elsevier Inc.  

## 2023-05-02 NOTE — Progress Notes (Signed)
Subjective:    Patient ID: Sandra Adams, female    DOB: October 27, 1966, 57 y.o.   MRN: [id number]   Chief Complaint: Urinary Tract Infection   Urinary Tract Infection    Patient comes in with 2 complaints: -Has had frequency and slight urgency for a couple of days. Did urine test at home that had a trace of leuks. Just feels weak - headaches- has long history of headaches. Have gotten bad the last 4 weeks. She has been taking ibuprofen and that relieves headache. Does not drink a lot of caffeine. Denies blurred vision or nausea or vomiting. Has a little dizziness with headache.  Rates pain 6/10. Intermittently- several times a week.  BP Readings from Last 3 Encounters:  05/02/23 138/87  03/03/23 137/85  02/09/23 129/83    Patient Active Problem List   Diagnosis Date Noted   Pruritus 07/29/2021   Osteoarthritis 11/06/2019   Osteopenia 07/21/2017   Chronic back pain 12/31/2016   Controlled substance agreement signed 12/31/2016   Overweight (BMI 25.0-29.9) 07/02/2016   GERD (gastroesophageal reflux disease) 10/01/2014   GAD (generalized anxiety disorder) 10/01/2014   Depression 10/01/2014   Menopausal hot flushes 09/16/2014   Dysmenorrhea 05/20/2014   Migraine without aura 03/27/2014       Review of Systems  Constitutional:  Negative for diaphoresis.  Eyes:  Negative for pain.  Respiratory:  Negative for shortness of breath.   Cardiovascular:  Negative for chest pain, palpitations and leg swelling.  Gastrointestinal:  Negative for abdominal pain.  Endocrine: Negative for polydipsia.  Skin:  Negative for rash.  Neurological:  Negative for dizziness, weakness and headaches.  Hematological:  Does not bruise/bleed easily.  All other systems reviewed and are negative.      Objective:   Physical Exam Vitals reviewed.  Constitutional:      Appearance: Normal appearance.  Cardiovascular:     Rate and Rhythm: Normal rate and regular rhythm.     Heart sounds: Normal  heart sounds.  Pulmonary:     Effort: Pulmonary effort is normal.     Breath sounds: Normal breath sounds.  Skin:    General: Skin is warm.  Neurological:     General: No focal deficit present.     Mental Status: She is alert and oriented to person, place, and time.     Cranial Nerves: No cranial nerve deficit.     Sensory: No sensory deficit.  Psychiatric:        Mood and Affect: Mood normal.        Behavior: Behavior normal.     BP 138/87   Pulse 88   Ht 5\' 2"  (1.575 m)   Wt 147 lb (66.7 kg)   LMP 10/16/2014   SpO2 99%   BMI 26.89 kg/m        Assessment & Plan:   Sandra Adams in today with chief complaint of Urinary Tract Infection   1. History of UTI Force fluids - Urine Culture - Urinalysis  2. Frequent headaches Avoid caffeine Keep diary of headaches.  - Ambulatory referral to Neurology - naproxen (NAPROSYN) 500 MG tablet; Take 1 tablet (500 mg total) by mouth 2 (two) times daily with a meal.  Dispense: 60 tablet; Refill: 1    The above assessment and management plan was discussed with the patient. The patient verbalized understanding of and has agreed to the management plan. Patient is aware to call the clinic if symptoms persist or worsen. Patient is aware when to  return to the clinic for a follow-up visit. Patient educated on when it is appropriate to go to the emergency department.   Mary-Margaret Hassell Done, FNP

## 2023-05-04 LAB — URINE CULTURE: Organism ID, Bacteria: NO GROWTH

## 2023-05-21 ENCOUNTER — Other Ambulatory Visit: Payer: Self-pay | Admitting: Family

## 2023-05-21 DIAGNOSIS — Z79899 Other long term (current) drug therapy: Secondary | ICD-10-CM

## 2023-05-21 DIAGNOSIS — F411 Generalized anxiety disorder: Secondary | ICD-10-CM

## 2023-05-21 DIAGNOSIS — E663 Overweight: Secondary | ICD-10-CM

## 2023-05-23 ENCOUNTER — Encounter: Payer: 59 | Admitting: Family

## 2023-05-31 ENCOUNTER — Other Ambulatory Visit: Payer: Self-pay | Admitting: Family

## 2023-05-31 DIAGNOSIS — H6992 Unspecified Eustachian tube disorder, left ear: Secondary | ICD-10-CM

## 2023-06-08 DIAGNOSIS — Z20822 Contact with and (suspected) exposure to covid-19: Secondary | ICD-10-CM | POA: Diagnosis not present

## 2023-06-08 DIAGNOSIS — J029 Acute pharyngitis, unspecified: Secondary | ICD-10-CM | POA: Diagnosis not present

## 2023-06-08 DIAGNOSIS — N3001 Acute cystitis with hematuria: Secondary | ICD-10-CM | POA: Diagnosis not present

## 2023-06-08 DIAGNOSIS — J3489 Other specified disorders of nose and nasal sinuses: Secondary | ICD-10-CM | POA: Diagnosis not present

## 2023-06-08 DIAGNOSIS — R3915 Urgency of urination: Secondary | ICD-10-CM | POA: Diagnosis not present

## 2023-06-09 ENCOUNTER — Ambulatory Visit: Payer: 59 | Admitting: Family

## 2023-06-09 NOTE — Patient Instructions (Addendum)
Our records indicate that you are due for your screening mammogram.  Please call the imaging center that does your yearly mammograms to make an appointment for a mammogram at your earliest convenience. Our office also has a mobile unit through the [facility] that comes to our location. Please call our office if you would like to make an appointment. Health Maintenance, Female Adopting a healthy lifestyle and getting preventive care are important in promoting health and wellness. Ask your health care provider about: The right schedule for you to have regular tests and exams. Things you can do on your own to prevent diseases and keep yourself healthy. What should I know about diet, weight, and exercise? Eat a healthy diet  Eat a diet that includes plenty of vegetables, fruits, low-fat dairy products, and lean protein. Do not eat a lot of foods that are high in solid fats, added sugars, or sodium. Maintain a healthy weight Body mass index (BMI) is used to identify weight problems. It estimates body fat based on height and weight. Your health care provider can help determine your BMI and help you achieve or maintain a healthy weight. Get regular exercise Get regular exercise. This is one of the most important things you can do for your health. Most adults should: Exercise for at least 150 minutes each week. The exercise should increase your heart rate and make you sweat (moderate-intensity exercise). Do strengthening exercises at least twice a week. This is in addition to the moderate-intensity exercise. Spend less time sitting. Even light physical activity can be beneficial. Watch cholesterol and blood lipids Have your blood tested for lipids and cholesterol at 59 years of age, then have this test every 5 years. Have your cholesterol levels checked more often if: Your lipid or cholesterol levels are high. You are older than 59 years of age. You are at high risk for heart  disease. What should I know about cancer screening? Depending on your health history and family history, you may need to have cancer screening at various ages. This may include screening for: Breast cancer. Cervical cancer. Colorectal cancer. Skin cancer. Lung cancer. What should I know about heart disease, diabetes, and high blood pressure? Blood pressure and heart disease High blood pressure causes heart disease and increases the risk of stroke. This is more likely to develop in people who have high blood pressure readings or are overweight. Have your blood pressure checked: Every 3-5 years if you are 18-39 years of age. Every year if you are 40 years old or older. Diabetes Have regular diabetes screenings. This checks your fasting blood sugar level. Have the screening done: Once every three years after age 40 if you are at a normal weight and have a low risk for diabetes. More often and at a younger age if you are overweight or have a high risk for diabetes. What should I know about preventing infection? Hepatitis B If you have a higher risk for hepatitis B, you should be screened for this virus. Talk with your health care provider to find out if you are at risk for hepatitis B infection. Hepatitis C Testing is recommended for: Everyone born from 1945 through 1965. Anyone with known risk factors for hepatitis C. Sexually transmitted infections (STIs) Get screened for STIs, including gonorrhea and chlamydia, if: You are sexually active and are younger than 59 years of age. You are older than 59 years of age and your health care provider tells you that you are   at risk for this type of infection. Your sexual activity has changed since you were last screened, and you are at increased risk for chlamydia or gonorrhea. Ask your health care provider if you are at risk. Ask your health care provider about whether you are at high risk for HIV. Your health care provider may recommend a  prescription medicine to help prevent HIV infection. If you choose to take medicine to prevent HIV, you should first get tested for HIV. You should then be tested every 3 months for as long as you are taking the medicine. Pregnancy If you are about to stop having your period (premenopausal) and you may become pregnant, seek counseling before you get pregnant. Take 400 to 800 micrograms (mcg) of folic acid every day if you become pregnant. Ask for birth control (contraception) if you want to prevent pregnancy. Osteoporosis and menopause Osteoporosis is a disease in which the bones lose minerals and strength with aging. This can result in bone fractures. If you are 65 years old or older, or if you are at risk for osteoporosis and fractures, ask your health care provider if you should: Be screened for bone loss. Take a calcium or vitamin D supplement to lower your risk of fractures. Be given hormone replacement therapy (HRT) to treat symptoms of menopause. Follow these instructions at home: Alcohol use Do not drink alcohol if: Your health care provider tells you not to drink. You are pregnant, may be pregnant, or are planning to become pregnant. If you drink alcohol: Limit how much you have to: 0-1 drink a day. Know how much alcohol is in your drink. In the [place] one drink equals one 12 oz bottle of beer (355 mL), one 5 oz glass of wine (148 mL), or one 1 oz glass of hard liquor (44 mL). Lifestyle Do not use any products that contain nicotine or tobacco. These products include cigarettes, chewing tobacco, and vaping devices, such as e-cigarettes. If you need help quitting, ask your health care provider. Do not use street drugs. Do not share needles. Ask your health care provider for help if you need support or information about quitting drugs. General instructions Schedule regular health, dental, and eye exams. Stay current with your vaccines. Tell your health care provider if: You often  feel depressed. You have ever been abused or do not feel safe at home. Summary Adopting a healthy lifestyle and getting preventive care are important in promoting health and wellness. Follow your health care provider's instructions about healthy diet, exercising, and getting tested or screened for diseases. Follow your health care provider's instructions on monitoring your cholesterol and blood pressure. This information is not intended to replace advice given to you by your health care provider. Make sure you discuss any questions you have with your health care provider. Document Revised: 04/27/2021 Document Reviewed: 04/27/2021 Elsevier Patient Education  2024 Elsevier Inc.   

## 2023-06-13 ENCOUNTER — Ambulatory Visit (INDEPENDENT_AMBULATORY_CARE_PROVIDER_SITE_OTHER): Payer: 59 | Admitting: Family

## 2023-06-13 ENCOUNTER — Encounter: Payer: Self-pay | Admitting: Family

## 2023-06-13 VITALS — BP 130/80 | HR 74 | Temp 96.8°F | Ht 62.0 in | Wt 145.8 lb

## 2023-06-13 DIAGNOSIS — F331 Major depressive disorder, recurrent, moderate: Secondary | ICD-10-CM | POA: Diagnosis not present

## 2023-06-13 DIAGNOSIS — G8929 Other chronic pain: Secondary | ICD-10-CM | POA: Diagnosis not present

## 2023-06-13 DIAGNOSIS — M545 Low back pain, unspecified: Secondary | ICD-10-CM

## 2023-06-13 DIAGNOSIS — Z79899 Other long term (current) drug therapy: Secondary | ICD-10-CM | POA: Diagnosis not present

## 2023-06-13 DIAGNOSIS — K219 Gastro-esophageal reflux disease without esophagitis: Secondary | ICD-10-CM | POA: Diagnosis not present

## 2023-06-13 DIAGNOSIS — F411 Generalized anxiety disorder: Secondary | ICD-10-CM | POA: Diagnosis not present

## 2023-06-13 DIAGNOSIS — M159 Polyosteoarthritis, unspecified: Secondary | ICD-10-CM | POA: Diagnosis not present

## 2023-06-13 DIAGNOSIS — Z0001 Encounter for general adult medical examination with abnormal findings: Secondary | ICD-10-CM | POA: Diagnosis not present

## 2023-06-13 DIAGNOSIS — Z0000 Encounter for general adult medical examination without abnormal findings: Secondary | ICD-10-CM

## 2023-06-13 DIAGNOSIS — E663 Overweight: Secondary | ICD-10-CM

## 2023-06-13 NOTE — Progress Notes (Signed)
Subjective:    Patient ID: Sandra Adams, female    DOB: 08-Aug-1966, 57 y.o.   MRN: [id number]  Chief Complaint  Patient presents with   Annual Exam   Pt presents to the office today for CPE without pap.  Migraine  This is a chronic problem. The problem occurs intermittently. The pain is located in the Frontal region. The pain quality is similar to prior headaches. The pain is mild. Associated symptoms include back pain. The symptoms are aggravated by emotional stress (sinus). She has tried Excedrin for the symptoms. The treatment provided moderate relief.  Gastroesophageal Reflux She complains of belching and heartburn. This is a chronic problem. The current episode started more than 1 year ago. The problem occurs occasionally. The problem has been waxing and waning. Pertinent negatives include no fatigue. She has tried an antacid for the symptoms. The treatment provided moderate relief.  Arthritis Presents for follow-up visit. She complains of pain and stiffness. The symptoms have been stable. Affected locations include the left knee, right knee, left MCP, right MCP, left hip and right hip. Her pain is at a severity of 2/10. Pertinent negatives include no fatigue.  Anemia Presents for follow-up visit. There has been no malaise/fatigue.  Depression        This is a chronic problem.  The current episode started more than 1 year ago.   The problem occurs intermittently.  Associated symptoms include helplessness and sad.  Associated symptoms include no fatigue, no hopelessness and no suicidal ideas.  Past treatments include SSRIs - Selective serotonin reuptake inhibitors. Back Pain This is a chronic problem. The current episode started more than 1 year ago. The problem occurs intermittently. The problem has been waxing and waning since onset. The pain is present in the lumbar spine. The quality of the pain is described as aching. The pain is at a severity of 2/10. The pain is mild. She has  tried bed rest and NSAIDs for the symptoms. The treatment provided moderate relief.      Review of Systems  Constitutional:  Negative for fatigue and malaise/fatigue.  Gastrointestinal:  Positive for heartburn.  Musculoskeletal:  Positive for arthritis, back pain and stiffness.  Psychiatric/Behavioral:  Positive for depression. Negative for suicidal ideas.   All other systems reviewed and are negative.  Family History  Problem Relation Age of Onset   Epilepsy Mother    COPD Father    Hyperlipidemia Father    Heart disease Father    Cancer Father    COPD Sister    Anxiety disorder Sister    COPD Maternal Aunt    Diabetes Maternal Uncle    Diabetes Maternal Aunt        x2   Colon cancer Neg Hx    Colon polyps Neg Hx    Esophageal cancer Neg Hx    Gallbladder disease Neg Hx    Social History   Socioeconomic History   Marital status: Divorced    Spouse name: Not on file   Number of children: 0   Years of education: 12th   Highest education level: High school graduate  Occupational History   Occupation: [employer]    Comment: Back : [employer] surgery   Tobacco Use   Smoking status: Never   Smokeless tobacco: Never  Vaping Use   Vaping Use: Never used  Substance and Sexual Activity   Alcohol use: No   Drug use: No   Sexual activity: Not Currently    Partners:  Male    Birth control/protection: None  Other Topics Concern   Not on file  Social History Narrative   Single, no children. Right handed. 12 th grade. No caffeine.   Divorced since 2004.   Social Determinants of Health   Financial Resource Strain: Low Risk  (04/05/2023)   Overall Financial Resource Strain (CARDIA)    Difficulty of Paying Living Expenses: Not hard at all  Food Insecurity: No Food Insecurity (04/05/2023)   Hunger Vital Sign    Worried About Running Out of Food in the Last Year: Never true    Ran Out of Food in the Last Year: Never true  Transportation Needs: No Transportation Needs  (04/05/2023)   PRAPARE - Administrator, Civil Service (Medical): No    Lack of Transportation (Non-Medical): No  Physical Activity: Insufficiently Active (04/05/2023)   Exercise Vital Sign    Days of Exercise per Week: 3 days    Minutes of Exercise per Session: 30 min  Stress: No Stress Concern Present (04/05/2023)   [employer] of Occupational Health - Occupational Stress Questionnaire    Feeling of Stress : Not at all  Social Connections: Moderately Isolated (04/05/2023)   Social Connection and Isolation Panel [NHANES]    Frequency of Communication with Friends and Family: More than three times a week    Frequency of Social Gatherings with Friends and Family: More than three times a week    Attends Religious Services: More than 4 times per year    Active Member of [employer] or Organizations: No    Attends Banker Meetings: Never    Marital Status: Divorced       Objective:   Physical Exam Vitals reviewed.  Constitutional:      General: She is not in acute distress.    Appearance: She is well-developed.  HENT:     Head: Normocephalic and atraumatic.     Right Ear: Tympanic membrane normal.     Left Ear: Tympanic membrane normal.  Eyes:     Pupils: Pupils are equal, round, and reactive to light.  Neck:     Thyroid: No thyromegaly.  Cardiovascular:     Rate and Rhythm: Normal rate and regular rhythm.     Heart sounds: Normal heart sounds. No murmur heard. Pulmonary:     Effort: Pulmonary effort is normal. No respiratory distress.     Breath sounds: Normal breath sounds. No wheezing.  Abdominal:     General: Bowel sounds are normal. There is no distension.     Palpations: Abdomen is soft.     Tenderness: There is no abdominal tenderness.  Musculoskeletal:        General: No tenderness. Normal range of motion.     Cervical back: Normal range of motion and neck supple.  Skin:    General: Skin is warm and dry.  Neurological:     Mental Status: She  is alert and oriented to person, place, and time.     Cranial Nerves: No cranial nerve deficit.     Deep Tendon Reflexes: Reflexes are normal and symmetric.  Psychiatric:        Behavior: Behavior normal.        Thought Content: Thought content normal.        Judgment: Judgment normal.       BP 130/80   Pulse 74   Temp (!) 96.8 F (36 C) (Temporal)   Ht 5\' 2"  (1.575 m)   Wt 145 lb  12.8 oz (66.1 kg)   LMP 10/16/2014   SpO2 98%   BMI 26.67 kg/m      Assessment & Plan:   TYSHEA IMEL comes in today with chief complaint of Annual Exam   Diagnosis and orders addressed:  1. Annual physical exam - CBC with Differential/Platelet - Lipid panel - CMP14+EGFR - TSH  2. Chronic bilateral low back pain without sciatica - CBC with Differential/Platelet - CMP14+EGFR  3. Moderate episode of recurrent major depressive disorder (HCC) - CBC with Differential/Platelet - CMP14+EGFR  4. GAD (generalized anxiety disorder) - CBC with Differential/Platelet - CMP14+EGFR  5. Gastroesophageal reflux disease without esophagitis - CBC with Differential/Platelet - CMP14+EGFR  6. Primary osteoarthritis involving multiple joints  - CBC with Differential/Platelet - CMP14+EGFR  7. Overweight (BMI 25.0-29.9) - CBC with Differential/Platelet - CMP14+EGFR  8. Controlled substance agreement signed - CBC with Differential/Platelet - CMP14+EGFR   Labs pending Patient reviewed in [place] controlled database, no flags noted. Contract and drug screen are up to date.  Health Maintenance reviewed Diet and exercise encouraged  Follow up plan: 3 months  Jannifer Rodney, FNP

## 2023-06-14 LAB — CMP14+EGFR
ALT: 32 IU/L (ref 0–32)
AST: 20 IU/L (ref 0–40)
Albumin: 4.3 g/dL (ref 3.8–4.9)
Alkaline Phosphatase: 127 IU/L — ABNORMAL HIGH (ref 44–121)
BUN/Creatinine Ratio: 14 (ref 9–23)
BUN: 11 mg/dL (ref 6–24)
Bilirubin Total: 0.4 mg/dL (ref 0.0–1.2)
CO2: 24 mmol/L (ref 20–29)
Calcium: 9.9 mg/dL (ref 8.7–10.2)
Chloride: 106 mmol/L (ref 96–106)
Creatinine, Ser: 0.78 mg/dL (ref 0.57–1.00)
Globulin, Total: 2.5 g/dL (ref 1.5–4.5)
Glucose: 107 mg/dL — ABNORMAL HIGH (ref 70–99)
Potassium: 4.3 mmol/L (ref 3.5–5.2)
Sodium: 143 mmol/L (ref 134–144)
Total Protein: 6.8 g/dL (ref 6.0–8.5)
eGFR: 88 mL/min/{1.73_m2} (ref >59)

## 2023-06-14 LAB — LIPID PANEL
Chol/HDL Ratio: 3.5 ratio (ref 0.0–4.4)
Cholesterol, Total: 166 mg/dL (ref 100–199)
HDL: 47 mg/dL (ref >39)
LDL Chol Calc (NIH): 99 mg/dL (ref 0–99)
Triglycerides: 111 mg/dL (ref 0–149)
VLDL Cholesterol Cal: 20 mg/dL (ref 5–40)

## 2023-06-14 LAB — CBC WITH DIFFERENTIAL/PLATELET
Basophils Absolute: 0.1 10*3/uL (ref 0.0–0.2)
Basos: 1 %
EOS (ABSOLUTE): 0.2 10*3/uL (ref 0.0–0.4)
Eos: 3 %
Hematocrit: 42.3 % (ref 34.0–46.6)
Hemoglobin: 13.9 g/dL (ref 11.1–15.9)
Immature Grans (Abs): 0.1 10*3/uL (ref 0.0–0.1)
Immature Granulocytes: 1 %
Lymphocytes Absolute: 3.9 10*3/uL — ABNORMAL HIGH (ref 0.7–3.1)
Lymphs: 43 %
MCH: 31.2 pg (ref 26.6–33.0)
MCHC: 32.9 g/dL (ref 31.5–35.7)
MCV: 95 fL (ref 79–97)
Monocytes Absolute: 0.6 10*3/uL (ref 0.1–0.9)
Monocytes: 6 %
Neutrophils Absolute: 4.4 10*3/uL (ref 1.4–7.0)
Neutrophils: 46 %
Platelets: 232 10*3/uL (ref 150–450)
RBC: 4.46 x10E6/uL (ref 3.77–5.28)
RDW: 12.3 % (ref 11.7–15.4)
WBC: 9.3 10*3/uL (ref 3.4–10.8)

## 2023-06-14 LAB — TSH: TSH: 1.33 u[IU]/mL (ref 0.450–4.500)

## 2023-06-15 ENCOUNTER — Other Ambulatory Visit: Payer: Self-pay | Admitting: Family

## 2023-06-15 DIAGNOSIS — F411 Generalized anxiety disorder: Secondary | ICD-10-CM

## 2023-06-15 DIAGNOSIS — E663 Overweight: Secondary | ICD-10-CM

## 2023-06-15 DIAGNOSIS — Z79899 Other long term (current) drug therapy: Secondary | ICD-10-CM

## 2023-06-28 ENCOUNTER — Ambulatory Visit (INDEPENDENT_AMBULATORY_CARE_PROVIDER_SITE_OTHER): Payer: 59 | Admitting: Nurse Practitioner

## 2023-06-28 ENCOUNTER — Encounter: Payer: Self-pay | Admitting: Nurse Practitioner

## 2023-06-28 VITALS — BP 115/69 | HR 85 | Temp 97.0°F | Ht 62.0 in | Wt 145.8 lb

## 2023-06-28 DIAGNOSIS — N3001 Acute cystitis with hematuria: Secondary | ICD-10-CM | POA: Insufficient documentation

## 2023-06-28 DIAGNOSIS — R399 Unspecified symptoms and signs involving the genitourinary system: Secondary | ICD-10-CM | POA: Insufficient documentation

## 2023-06-28 LAB — URINALYSIS, ROUTINE W REFLEX MICROSCOPIC
Bilirubin, UA: NEGATIVE
Glucose, UA: NEGATIVE
Ketones, UA: NEGATIVE
Nitrite, UA: NEGATIVE
Protein,UA: NEGATIVE
Specific Gravity, UA: >1.03 — ABNORMAL HIGH (ref 1.005–1.030)
Urobilinogen, Ur: 0.2 mg/dL (ref 0.2–1.0)
pH, UA: 5.5 (ref 5.0–7.5)

## 2023-06-28 NOTE — Progress Notes (Signed)
Acute Office Visit  Subjective:     Patient ID: Sandra Adams, female    DOB: 1966/07/06, 57 y.o.   MRN: [id number]  Chief Complaint  Patient presents with   Flank Pain    Has been having left side pain for a year   dark urine    HPI  Sandra Adams is a 57 y.o. female who complains of urinary frequency, urgency and dysuria, flank pain x 3 days, without fever, chills, or abnormal vaginal discharge or bleeding. She was see at the urgent care on 06/08/23 and prescribed amoxicillin for 10 days for UTI, only took it for 3 days and stopped it " I don't like taking new medications, didn't like how  it made me feel" She has an appointment to urology on 07/15/23 d/t recurrent UTI.   ROS Negative unless indicated in HPI    Objective:    BP 115/69   Pulse 85   Temp (!) 97 F (36.1 C) (Temporal)   Ht 5\' 2"  (1.575 m)   Wt 145 lb 12.8 oz (66.1 kg)   LMP 10/16/2014   SpO2 95%   BMI 26.67 kg/m  BP Readings from Last 3 Encounters:  06/28/23 115/69  06/13/23 130/80  05/02/23 138/87   Wt Readings from Last 3 Encounters:  06/28/23 145 lb 12.8 oz (66.1 kg)  06/13/23 145 lb 12.8 oz (66.1 kg)  05/02/23 147 lb (66.7 kg)      Physical Exam Vitals and nursing note reviewed.  Constitutional:      Appearance: Normal appearance.  HENT:     Head: Normocephalic and atraumatic.  Cardiovascular:     Rate and Rhythm: Normal rate.     Heart sounds: Normal heart sounds.  Pulmonary:     Effort: Pulmonary effort is normal.     Breath sounds: Normal breath sounds.  Abdominal:     General: Bowel sounds are normal. There is no distension.     Palpations: Abdomen is soft. There is no mass.     Tenderness: There is no abdominal tenderness. There is no right CVA tenderness or left CVA tenderness.  Musculoskeletal:        General: Normal range of motion.     Right lower leg: No edema.     Left lower leg: No edema.  Skin:    General: Skin is warm and dry.     Findings: No rash.   Neurological:     General: No focal deficit present.     Mental Status: She is alert and oriented to person, place, and time. Mental status is at baseline.    Urine dipstick shows positive for RBC's and positive for leukocytes.  Micro exam: TNP WBC's per HPF.  No results found for any visits on 06/28/23.      Assessment & Plan:  UTI symptoms -     Urinalysis, Routine w reflex microscopic  Acute cystitis with hematuria -     Urine Culture    ASSESSMENT: UTI uncomplicated without evidence of pyelonephritis  PLAN: Continue Amoxicillin that was prescribed at the urgent,. Take all until done Urine culture ordered, she is made aware that based on results may have to change ATB - also push fluids, may use Pyridium OTC prn.  Call or return to clinic prn if these symptoms worsen or fail to improve as anticipated.  Return if symptoms worsen or fail to improve.  Martina Sinner, [facility] [address] [place],  [place] [address] [phone]

## 2023-06-28 NOTE — Progress Notes (Signed)
Results discussed with client during the encounter

## 2023-06-30 LAB — URINE CULTURE: Organism ID, Bacteria: NO GROWTH

## 2023-07-01 ENCOUNTER — Other Ambulatory Visit: Payer: Self-pay | Admitting: Nurse Practitioner

## 2023-07-01 DIAGNOSIS — R519 Headache, unspecified: Secondary | ICD-10-CM

## 2023-07-06 DIAGNOSIS — Z1231 Encounter for screening mammogram for malignant neoplasm of breast: Secondary | ICD-10-CM | POA: Diagnosis not present

## 2023-07-07 ENCOUNTER — Telehealth: Payer: Self-pay | Admitting: Family

## 2023-07-07 DIAGNOSIS — R350 Frequency of micturition: Secondary | ICD-10-CM | POA: Diagnosis not present

## 2023-07-07 NOTE — Telephone Encounter (Signed)
Urine culture negative. If still having problems needs to follow up with Urologists.   Jannifer Rodney, FNP

## 2023-07-07 NOTE — Telephone Encounter (Signed)
Patient aware and verbalizes understanding. 

## 2023-07-08 ENCOUNTER — Ambulatory Visit (INDEPENDENT_AMBULATORY_CARE_PROVIDER_SITE_OTHER): Payer: 59 | Admitting: Family

## 2023-07-08 ENCOUNTER — Encounter: Payer: Self-pay | Admitting: Family

## 2023-07-08 VITALS — BP 138/79 | Temp 97.5°F | Resp 18 | Ht 62.0 in | Wt 145.0 lb

## 2023-07-08 DIAGNOSIS — R531 Weakness: Secondary | ICD-10-CM

## 2023-07-08 DIAGNOSIS — F411 Generalized anxiety disorder: Secondary | ICD-10-CM

## 2023-07-08 DIAGNOSIS — B9689 Other specified bacterial agents as the cause of diseases classified elsewhere: Secondary | ICD-10-CM

## 2023-07-08 DIAGNOSIS — N760 Acute vaginitis: Secondary | ICD-10-CM

## 2023-07-08 DIAGNOSIS — R399 Unspecified symptoms and signs involving the genitourinary system: Secondary | ICD-10-CM

## 2023-07-08 LAB — URINALYSIS, COMPLETE
Bilirubin, UA: NEGATIVE
Glucose, UA: NEGATIVE
Ketones, UA: NEGATIVE
Nitrite, UA: NEGATIVE
Protein,UA: NEGATIVE
Specific Gravity, UA: 1.015 (ref 1.005–1.030)
Urobilinogen, Ur: 0.2 mg/dL (ref 0.2–1.0)
pH, UA: 6 (ref 5.0–7.5)

## 2023-07-08 LAB — WET PREP FOR TRICH, YEAST, CLUE
Clue Cell Exam: POSITIVE — AB
Trichomonas Exam: NEGATIVE
Yeast Exam: NEGATIVE

## 2023-07-08 LAB — MICROSCOPIC EXAMINATION

## 2023-07-08 MED ORDER — METRONIDAZOLE 500 MG PO TABS
500.0000 mg | ORAL_TABLET | Freq: Two times a day (BID) | ORAL | 0 refills | Status: AC
Start: 2023-07-08 — End: 2023-07-15

## 2023-07-08 NOTE — Progress Notes (Signed)
Subjective:    Patient ID: Sandra Adams, female    DOB: March 13, 1966, 57 y.o.   MRN: [id number]  Chief Complaint  Patient presents with   Urinary Tract Infection   Pt presents to the office today with dysuria and weakness. Her dog passed away and causing her a lot of anxiety.  Urinary Frequency  This is a recurrent problem. The current episode started 1 to 4 weeks ago. The problem occurs intermittently. The patient is experiencing no pain. Associated symptoms include frequency, hesitancy and urgency. Pertinent negatives include no hematuria, nausea or vomiting. Associated symptoms comments: Urine odor. She has tried increased fluids for the symptoms. The treatment provided mild relief.      Review of Systems  Gastrointestinal:  Negative for nausea and vomiting.  Genitourinary:  Positive for frequency, hesitancy and urgency. Negative for hematuria.  All other systems reviewed and are negative.      Objective:   Physical Exam Vitals reviewed.  Constitutional:      General: She is not in acute distress.    Appearance: She is well-developed.  HENT:     Head: Normocephalic and atraumatic.     Right Ear: Tympanic membrane normal.     Left Ear: Tympanic membrane normal.  Eyes:     Pupils: Pupils are equal, round, and reactive to light.  Neck:     Thyroid: No thyromegaly.  Cardiovascular:     Rate and Rhythm: Normal rate and regular rhythm.     Heart sounds: Normal heart sounds. No murmur heard. Pulmonary:     Effort: Pulmonary effort is normal. No respiratory distress.     Breath sounds: Normal breath sounds. No wheezing.  Abdominal:     General: Bowel sounds are normal. There is no distension.     Palpations: Abdomen is soft.     Tenderness: There is no abdominal tenderness.  Musculoskeletal:        General: No tenderness. Normal range of motion.     Cervical back: Normal range of motion and neck supple.  Skin:    General: Skin is warm and dry.  Neurological:      Mental Status: She is alert and oriented to person, place, and time.     Cranial Nerves: No cranial nerve deficit.     Deep Tendon Reflexes: Reflexes are normal and symmetric.  Psychiatric:        Behavior: Behavior normal.        Thought Content: Thought content normal.        Judgment: Judgment normal.       BP 138/79   Temp (!) 97.5 F (36.4 C) (Temporal)   Resp 18   Ht 5\' 2"  (1.575 m)   Wt 145 lb (65.8 kg)   LMP 10/16/2014   SpO2 99%   BMI 26.52 kg/m      Assessment & Plan:  Sandra Adams comes in today with chief complaint of Urinary Tract Infection   Diagnosis and orders addressed:  1. UTI symptoms  - Urinalysis, Complete - Microalbumin / creatinine urine ratio - WET PREP FOR TRICH, YEAST, CLUE  2. BV (bacterial vaginosis) - metroNIDAZOLE (FLAGYL) 500 MG tablet; Take 1 tablet (500 mg total) by mouth 2 (two) times daily for 7 days.  Dispense: 14 tablet; Refill: 0  3. Weakness Rest   4. GAD (generalized anxiety disorder) Stress management    Start flagyl  Force fluids Keep clean and dry Probiotic daily Rest Follow up if symptoms or worsen  Jannifer Rodney, FNP

## 2023-07-08 NOTE — Patient Instructions (Signed)

## 2023-07-09 LAB — MICROALBUMIN / CREATININE URINE RATIO
Creatinine, Urine: 52.5 mg/dL
Microalb/Creat Ratio: 20 mg/g creat (ref 0–29)
Microalbumin, Urine: 10.3 ug/mL

## 2023-07-11 ENCOUNTER — Telehealth: Payer: Self-pay | Admitting: Family

## 2023-07-11 MED ORDER — METRONIDAZOLE 0.75 % VA GEL: 1.0000 | Freq: Two times a day (BID) | VAGINAL | 0 refills | Status: DC

## 2023-07-11 NOTE — Telephone Encounter (Signed)
Flagyl changed to metogel vaginal for BID for 7 days.

## 2023-07-12 NOTE — Telephone Encounter (Signed)
Pt r/c, pt states her urine is still dark yellow, could the medication be causing this?

## 2023-07-12 NOTE — Telephone Encounter (Signed)
Patient aware and verbalized understanding. °

## 2023-07-12 NOTE — Telephone Encounter (Signed)
Possible, make sure she is drinking lots of fluids.

## 2023-07-12 NOTE — Telephone Encounter (Signed)
No, pt does not have a yeast infection she has BV. The vaginal medication should not be making her sick. I recommend she try to finish medication. I can send Zofran as needed to take for nausea.   Jannifer Rodney, FNP

## 2023-07-12 NOTE — Telephone Encounter (Signed)
Pt called back. Reviewed PCPs note with pt. Pt says she is drinking plenty of fluids but says the medicine is causing her to feel sick and she can't keep taking it. Says she has 4 doses left.   Pt says she has Fluconazole 150 mg and wants to know if she can take that instead?

## 2023-07-14 DIAGNOSIS — R922 Inconclusive mammogram: Secondary | ICD-10-CM | POA: Diagnosis not present

## 2023-07-14 DIAGNOSIS — N6002 Solitary cyst of left breast: Secondary | ICD-10-CM | POA: Diagnosis not present

## 2023-07-14 DIAGNOSIS — N63 Unspecified lump in unspecified breast: Secondary | ICD-10-CM | POA: Diagnosis not present

## 2023-07-14 NOTE — Progress Notes (Deleted)
Name: Sandra Adams DOB: 02/16/1966 MRN: [id number]  History of Present Illness: Sandra Adams is a 57 y.o. female who presents today as a new patient at [facility]. All available relevant medical records have been reviewed.   She reports chief complaint of recurrent UTls.   Urine culture results in past 12+ months: - 02/15/2022: Negative - 03/03/2023: Negative - 04/09/2022: Positive for Citrobacter freundii - 05/20/2022: Negative - 06/29/2022: Negative - 02/09/2023: Negative - 05/02/2023: Negative - 06/08/2023: Positive for >100k Streptococcus species - 06/28/2023: Negative - 07/07/2023: Positive for E. coli   Urinary Symptoms: She reports *** UTl's in the last year. When present, UTI symptoms include ***dysuria, ***increased urinary urgency, ***frequency, ***. She reports that UTI symptoms do ***not seem to correlate with intercourse. She {Actions; denies-reports:120008} acute UTI symptoms today.  At baseline: She {Actions; denies-reports:120008} urinary urgency, frequency, dysuria, gross hematuria, hesitancy, straining to void, or sensations of incomplete emptying. She reports voiding *** times per day and *** times at night. She {Actions; denies-reports:120008} pushing on a bulge in order to empty her bladder.  She {Actions; denies-reports:120008} urge incontinence. She {Actions; denies-reports:120008} stress incontinence with ***cough/***laugh/***sneeze/***heavy lifting/***exercise. She {Actions; denies-reports:120008} enuresis. She leaks *** times per ***. Wears *** ***pads / ***diapers per day. She states the ***SUI / ***UUI is predominant. This has been going on for {NUMBERS 1-12:18279} {days/wks/mos/yrs:310907} and {ACTION; IS/IS OZD:66440347} significantly bothersome. In terms of treatment, She has tried ***.  She {Actions; denies-reports:120008} caffeine intake.  She {Actions; denies-reports:120008} history of pyelonephritis.  She {Actions;  denies-reports:120008} history of kidney stones.  Vaginal / prolapse Symptoms: She {Actions; denies-reports:120008} vaginal bulge sensation.  She {Actions; denies-reports:120008} seeing a vaginal bulge. This bulge is bothersome and is the size of ***. It was first noticed ***.  She {Actions; denies-reports:120008} vaginal pain, bleeding, or discharge.  She {Actions; denies-reports:120008} use of topical vaginal estrogen cream.  Bowel Symptoms: She has a continent bowel movement *** time(s) per ***. Typical Bristol stool scale of continent bowel episodes is Type ***. She {Actions; denies-reports:120008} Fl episodes. Fecal incontinence started *** and occurs *** times per week. Typical Bristol stool scale of incontinent bowel episodes is Type ***. She {Actions; denies-reports:120008} straining and {Actions; denies-reports:120008} splinting to defecate. She {Actions; denies-reports:120008} rectal bleeding. She {Actions; denies-reports:120008} feeling like she fully empties her rectum with defecation. She {Actions; denies-reports:120008} urgency with defecation. She {Actions; denies-reports:120008} difficulty wiping clean. She {Actions; denies-reports:120008} taking laxatives, stool softeners, or fiber supplements. Last colonoscopy was***.  Past OB/GYN History: OB History     Gravida  0   Para  0   Term  0   Preterm  0   AB  0   Living  0      SAB  0   IAB  0   Ectopic  0   Multiple  0   Live Births             She {Actions; denies-reports:120008} being sexually active.  She {Actions; denies-reports:120008} dyspareunia. ***Dyspareunia is located ***at the introitus / ***deep inside the vagina. She has *** child(ren) who were delivered ***vaginally ***via c-section. She {Actions; denies-reports:120008} significant tearing with that ***delivery / those ***deliveries. She is {DESC; PRE/POST:32304} menopausal.  Last pap smear was ***. She {Actions; denies-reports:120008}  history of *** hysterectomy.  Fall Screening: Do you usually have a device to assist in your mobility? {yes/no:20286} ***cane / ***walker / ***wheelchair   Medications: Current Outpatient Medications  Medication Sig Dispense Refill   ALPRAZolam Prudy Feeler)  0.25 MG tablet TAKE 1 TABLET BY MOUTH 2 TIMES DAILY AS NEEDED FOR ANXIETY. 45 tablet 2   cetirizine (ZYRTEC ALLERGY) 10 MG tablet Take 1 tablet (10 mg total) by mouth daily. 90 tablet 1   escitalopram (LEXAPRO) 10 MG tablet Take 1 tablet (10 mg total) by mouth daily. 90 tablet 3   fluticasone (FLONASE) 50 MCG/ACT nasal spray SPRAY 2 SPRAYS INTO EACH NOSTRIL EVERY DAY 48 mL 2   gabapentin (NEURONTIN) 100 MG capsule Take 1 capsule (100 mg total) by mouth 3 (three) times daily. 90 capsule 3   metroNIDAZOLE (FLAGYL) 500 MG tablet Take 1 tablet (500 mg total) by mouth 2 (two) times daily for 7 days. 14 tablet 0   metroNIDAZOLE (METROGEL) 0.75 % vaginal gel Place 1 Applicatorful vaginally 2 (two) times daily. 70 g 0   naproxen (NAPROSYN) 500 MG tablet TAKE 1 TABLET BY MOUTH 2 TIMES DAILY WITH A MEAL. 60 tablet 1   No current facility-administered medications for this visit.    Allergies: Allergies  Allergen Reactions   Ciprofloxacin     Makes her feel weak,dizzy   Imitrex [Sumatriptan]     Blurred vision    Penicillins    Prednisone Nausea And Vomiting   Tylenol [Acetaminophen]     Tylenol #3,Headaches   Vicodin [Hydrocodone-Acetaminophen]     GI upset   Promethazine Nausea And Vomiting    Past Medical History:  Diagnosis Date   Anxiety    Arthritis    Depression    Menopausal symptom    Migraine    Scoliosis    Past Surgical History:  Procedure Laterality Date   KNEE SURGERY Left 1974 and 1979   Family History  Problem Relation Age of Onset   Epilepsy Mother    COPD Father    Hyperlipidemia Father    Heart disease Father    Cancer Father    COPD Sister    Anxiety disorder Sister    COPD Maternal Aunt    Diabetes  Maternal Uncle    Diabetes Maternal Aunt        x2   Colon cancer Neg Hx    Colon polyps Neg Hx    Esophageal cancer Neg Hx    Gallbladder disease Neg Hx    Social History   Socioeconomic History   Marital status: Divorced    Spouse name: Not on file   Number of children: 0   Years of education: 12th   Highest education level: High school graduate  Occupational History   Occupation: [employer]    Comment: Back : [employer] surgery   Tobacco Use   Smoking status: Never   Smokeless tobacco: Never  Vaping Use   Vaping status: Never Used  Substance and Sexual Activity   Alcohol use: No   Drug use: No   Sexual activity: Not Currently    Partners: Male    Birth control/protection: None  Other Topics Concern   Not on file  Social History Narrative   Single, no children. Right handed. 12 th grade. No caffeine.   Divorced since 2004.   Social Determinants of Health   Financial Resource Strain: Low Risk  (04/05/2023)   Overall Financial Resource Strain (CARDIA)    Difficulty of Paying Living Expenses: Not hard at all  Food Insecurity: No Food Insecurity (04/05/2023)   Hunger Vital Sign    Worried About Running Out of Food in the Last Year: Never true    Ran Out of Food  in the Last Year: Never true  Transportation Needs: No Transportation Needs (04/05/2023)   PRAPARE - Administrator, Civil Service (Medical): No    Lack of Transportation (Non-Medical): No  Physical Activity: Insufficiently Active (04/05/2023)   Exercise Vital Sign    Days of Exercise per Week: 3 days    Minutes of Exercise per Session: 30 min  Stress: No Stress Concern Present (04/05/2023)   [employer] of Occupational Health - Occupational Stress Questionnaire    Feeling of Stress : Not at all  Social Connections: Moderately Isolated (04/05/2023)   Social Connection and Isolation Panel [NHANES]    Frequency of Communication with Friends and Family: More than three times a week     Frequency of Social Gatherings with Friends and Family: More than three times a week    Attends Religious Services: More than 4 times per year    Active Member of [employer] or Organizations: No    Attends Banker Meetings: Never    Marital Status: Divorced  Catering manager Violence: Not At Risk (04/05/2023)   Humiliation, Afraid, Rape, and Kick questionnaire    Fear of Current or Ex-Partner: No    Emotionally Abused: No    Physically Abused: No    Sexually Abused: No    SUBJECTIVE  Review of Systems Constitutional: Patient ***denies any unintentional weight loss or change in strength lntegumentary: Patient ***denies any rashes or pruritus Eyes: Patient denies ***dry eyes ENT: Patient ***denies dry mouth Cardiovascular: Patient ***denies chest pain or syncope Respiratory: Patient ***denies shortness of breath Gastrointestinal: Patient ***denies nausea, vomiting, constipation, or diarrhea Musculoskeletal: Patient ***denies muscle cramps or weakness Neurologic: Patient ***denies convulsions or seizures Psychiatric: Patient ***denies memory problems Allergic/Immunologic: Patient ***denies recent allergic reaction(s) Hematologic/Lymphatic: Patient denies bleeding tendencies Endocrine: Patient ***denies heat/cold intolerance  GU: As per HPI.  OBJECTIVE There were no vitals filed for this visit. There is no height or weight on file to calculate BMI.  Physical Examination Constitutional: ***No obvious distress; patient is ***non-toxic appearing  Cardiovascular: ***No visible lower extremity edema.  Respiratory: The patient does ***not have audible wheezing/stridor; respirations do ***not appear labored  Gastrointestinal: Abdomen ***non-distended Musculoskeletal: ***Normal ROM of UEs  Skin: ***No obvious rashes/open sores  Neurologic: CN 2-12 grossly ***intact Psychiatric: Answered questions ***appropriately with ***normal affect  Hematologic/Lymphatic/Immunologic: ***No  obvious bruises or sites of spontaneous bleeding  UA: {Desc; negative/positive:13464} for *** WBC/hpf, *** RBC/hpf, bacteria (***) PVR: *** ml  ASSESSMENT No diagnosis found.  ***Recurrent UTls / ***History of UTls with insufficient urine culture data to formally validate recurrent UTI diagnosis: *** 2. ***Vaginal atrophy: ***  Will plan for follow up in *** months or sooner if needed. Pt verbalized understanding and agreement. All questions were answered.  PLAN Advised the following: 1. *** 2. ***No follow-ups on file.  No orders of the defined types were placed in this encounter.   It has been explained that the patient is to follow regularly with their PCP in addition to all other providers involved in their care and to follow instructions provided by these respective offices. Patient advised to contact urology clinic if any urologic-pertaining questions, concerns, new symptoms or problems arise in the interim period.  There are no Patient Instructions on file for this visit.  Electronically signed by:  Donnita Falls, MSN, FNP-C, CUNP 07/14/2023 4:54 PM

## 2023-07-15 ENCOUNTER — Telehealth: Payer: Self-pay | Admitting: Family

## 2023-07-15 ENCOUNTER — Ambulatory Visit: Payer: 59 | Admitting: Urology

## 2023-07-15 DIAGNOSIS — N390 Urinary tract infection, site not specified: Secondary | ICD-10-CM

## 2023-07-15 MED ORDER — FLUCONAZOLE 150 MG PO TABS: 150.0000 mg | ORAL_TABLET | Freq: Once | ORAL | 0 refills | Status: AC

## 2023-07-15 NOTE — Telephone Encounter (Signed)
Pt called stating that she has taken 2 different antibiotics for UTI and has used vaginal cream but says she is itching real bad. Think she has a yeast infection from taking the antibiotics. Needs medicine for yeast infection.

## 2023-07-18 MED ORDER — FLUCONAZOLE 150 MG PO TABS: 150.0000 mg | ORAL_TABLET | ORAL | 0 refills | Status: DC | PRN

## 2023-07-18 NOTE — Addendum Note (Signed)
Addended by: Jannifer Rodney A on: 07/18/2023 03:28 PM   Modules accepted: Orders

## 2023-07-18 NOTE — Telephone Encounter (Signed)
Diflucan Prescription sent to pharmacy   

## 2023-07-18 NOTE — Telephone Encounter (Signed)
Patient is calling back about message left on 7-26. Itching real bad.

## 2023-07-19 ENCOUNTER — Encounter: Payer: Self-pay | Admitting: Family

## 2023-07-26 ENCOUNTER — Encounter: Payer: Self-pay | Admitting: Family Medicine

## 2023-07-26 ENCOUNTER — Ambulatory Visit (INDEPENDENT_AMBULATORY_CARE_PROVIDER_SITE_OTHER): Payer: 59 | Admitting: Family Medicine

## 2023-07-26 VITALS — BP 126/75 | HR 74 | Temp 98.2°F | Ht 62.0 in | Wt 146.0 lb

## 2023-07-26 DIAGNOSIS — R300 Dysuria: Secondary | ICD-10-CM | POA: Diagnosis not present

## 2023-07-26 DIAGNOSIS — R443 Hallucinations, unspecified: Secondary | ICD-10-CM

## 2023-07-26 DIAGNOSIS — F331 Major depressive disorder, recurrent, moderate: Secondary | ICD-10-CM | POA: Diagnosis not present

## 2023-07-26 DIAGNOSIS — Z789 Other specified health status: Secondary | ICD-10-CM

## 2023-07-26 LAB — URINALYSIS, ROUTINE W REFLEX MICROSCOPIC
Bilirubin, UA: NEGATIVE
Glucose, UA: NEGATIVE
Ketones, UA: NEGATIVE
Nitrite, UA: NEGATIVE
Protein,UA: NEGATIVE
Specific Gravity, UA: 1.025 (ref 1.005–1.030)
Urobilinogen, Ur: 0.2 mg/dL (ref 0.2–1.0)
pH, UA: 6 (ref 5.0–7.5)

## 2023-07-26 LAB — MICROSCOPIC EXAMINATION
Renal Epithel, UA: NONE SEEN /hpf
Yeast, UA: NONE SEEN

## 2023-07-26 LAB — WET PREP FOR TRICH, YEAST, CLUE
Clue Cell Exam: NEGATIVE
Trichomonas Exam: NEGATIVE
Yeast Exam: NEGATIVE

## 2023-07-26 NOTE — Progress Notes (Signed)
Acute Office Visit  Subjective:  Patient ID: Sandra Adams, female    DOB: Feb 24, 1966, 57 y.o.   MRN: [id number]  Chief Complaint  Patient presents with   bacterial infection   HPI Patient is in today for UTI like symptoms. States that for the last 4 weeks she is having bladder irritation. Denies low back pain and fever. Denies itching.  States that her urine is "milky" and has been changing color.  Endorses some slight discharge.  States that last night she had stomach pains and diarrhea. She wonders if that could be related or if it was due to something that she ate. She was recently treated for BV, completed abx and was provided diflucan for itching.  Brought her home Azo UTI test that was "positive"  Has been taking cystex OTC but is unsure of how long she has been taking it.  Has appt with Urology on the 27th.  States that she has had visual hallucinations of "snakes in the bed" and wakes up feeling weird  Denies any new sexual partners. Practices abstinence since 2015.  Has not been evaluated by gynecology  Patient is concerned that she could have bladder cancer as her father had bladder cancer.   ROS As per HPI  Objective:  BP 126/75   Pulse 74   Temp 98.2 F (36.8 C)   Ht 5\' 2"  (1.575 m)   Wt 146 lb (66.2 kg)   LMP 10/16/2014   SpO2 95%   BMI 26.70 kg/m   Physical Exam Constitutional:      General: She is awake. She is not in acute distress.    Appearance: Normal appearance. She is well-developed and well-groomed. She is not ill-appearing, toxic-appearing or diaphoretic.  Cardiovascular:     Rate and Rhythm: Normal rate and regular rhythm.     Pulses: Normal pulses.          Radial pulses are 2+ on the right side and 2+ on the left side.       Posterior tibial pulses are 2+ on the right side and 2+ on the left side.     Heart sounds: Normal heart sounds. No murmur heard.    No gallop.  Pulmonary:     Effort: Pulmonary effort is normal. No respiratory  distress.     Breath sounds: Normal breath sounds. No stridor. No wheezing, rhonchi or rales.  Abdominal:     General: Abdomen is flat. Bowel sounds are normal.     Palpations: Abdomen is soft.     Tenderness: There is no right CVA tenderness or left CVA tenderness.  Musculoskeletal:     Cervical back: Full passive range of motion without pain and neck supple.     Right lower leg: No edema.     Left lower leg: No edema.  Skin:    General: Skin is warm.     Capillary Refill: Capillary refill takes less than 2 seconds.  Neurological:     General: No focal deficit present.     Mental Status: She is alert, oriented to person, place, and time and easily aroused. Mental status is at baseline.     GCS: GCS eye subscore is 4. GCS verbal subscore is 5. GCS motor subscore is 6.     Motor: No weakness.  Psychiatric:        Attention and Perception: Attention normal. She perceives visual hallucinations.        Mood and Affect: Mood and affect normal.  Speech: Speech normal.        Behavior: Behavior normal. Behavior is cooperative.        Thought Content: Thought content normal. Thought content does not include homicidal or suicidal ideation. Thought content does not include homicidal or suicidal plan.        Cognition and Memory: Memory normal.        Judgment: Judgment normal.       07/26/2023    2:30 PM 06/28/2023   10:14 AM 06/13/2023   11:38 AM  Depression screen PHQ 2/9  Decreased Interest 0 1 0  Down, Depressed, Hopeless 1 0 0  PHQ - 2 Score 1 1 0  Altered sleeping 1 1 1   Tired, decreased energy 1 0 1  Change in appetite 1 0 1  Feeling bad or failure about yourself  0 0 0  Trouble concentrating 0 0 0  Moving slowly or fidgety/restless 0 0 0  Suicidal thoughts 0 0 0  PHQ-9 Score 4 2 3   Difficult doing work/chores  Not difficult at all Not difficult at all      07/26/2023    2:30 PM 06/28/2023   10:15 AM 06/13/2023   11:38 AM 05/02/2023    3:03 PM  GAD 7 : Generalized Anxiety  Score  Nervous, Anxious, on Edge 0 0 0 0  Control/stop worrying 0 0 0 0  Worry too much - different things 0 0 0 0  Trouble relaxing 0 0 0 0  Restless 0 0 0 0  Easily annoyed or irritable 0 0 0 0  Afraid - awful might happen 0 0 0 0  Total GAD 7 Score 0 0 0 0  Anxiety Difficulty Not difficult at all Not difficult at all Not difficult at all Not difficult at all   Assessment & Plan:  Sandra Adams was seen today for bacterial infection.  Diagnoses and all orders for this visit:  Dysuria Reviewed result during visit. Negative for trich, yeast, and clue cells. Discussed with patient wating for results of culture. Instructed patient to keep her follow up appt with urology to be evaluated for possibly interstitial cystitis  -     Urinalysis, Routine w reflex microscopic -     Urine Culture -     WET PREP FOR TRICH, YEAST, CLUE -     Microscopic Examination  Moderate episode of recurrent major depressive disorder (HCC) Discussed with patient following up with PCP. Denies SI.   Hallucination Discussed with patient following up with PCP and neurology for hallucinations. She was unable to elaborate on when they started or how long it lasted. Instructed patient that if she has increased confusion and hallucinations that she needs to return urgently for evaluation.   Poor historian Patient was unable to describe her symptoms. Unable to elaborate on "snakes in the bed". She is unsure of how often she is taking Cystex.   The above assessment and management plan was discussed with the patient. The patient verbalized understanding of and has agreed to the management plan using shared-decision making. Patient is aware to call the clinic if they develop any new symptoms or if symptoms fail to improve or worsen. Patient is aware when to return to the clinic for a follow-up visit. Patient educated on when it is appropriate to go to the emergency department.   Return if symptoms worsen or fail to  improve.  Neale Burly, DNP-FNP [facility] [address] [place], [place] [address] [phone]

## 2023-07-28 NOTE — Progress Notes (Signed)
Negative for infection, recommend increasing hydration and following up if symptoms continue.

## 2023-08-09 NOTE — Progress Notes (Deleted)
Name: Sandra Adams DOB: 1966/02/22 MRN: [id number]  History of Present Illness: Sandra Adams is a 58 y.o. female who presents today as a new patient at [facility] [place]. All available relevant medical records have been reviewed.   She reports chief complaint of recurrent UTls.   Urine culture results in past 12+ months: - 02/15/2022: Negative - 03/03/2023: Negative - 04/09/2022: Positive for Citrobacter freundii - 05/20/2022: Negative - 06/29/2022: Negative - 02/09/2023: Negative - 05/02/2023: Negative - 06/08/2023: Positive for >100k Streptococcus species - 06/28/2023: Negative - 07/07/2023: Positive for E. coli   Urinary Symptoms: She reports *** UTl's in the last year. When present, UTI symptoms include ***dysuria, ***increased urinary urgency, ***frequency, ***. She reports that UTI symptoms do ***not seem to correlate with intercourse. She {Actions; denies-reports:120008} acute UTI symptoms today.  At baseline: She {Actions; denies-reports:120008} urinary urgency, frequency, dysuria, gross hematuria, hesitancy, straining to void, or sensations of incomplete emptying. She reports voiding *** times per day and *** times at night. She {Actions; denies-reports:120008} pushing on a bulge in order to empty her bladder.  She {Actions; denies-reports:120008} urge incontinence. She {Actions; denies-reports:120008} stress incontinence with ***cough/***laugh/***sneeze/***heavy lifting/***exercise. She {Actions; denies-reports:120008} enuresis. She leaks *** times per ***. Wears *** ***pads / ***diapers per day. She states the ***SUI / ***UUI is predominant. This has been going on for {NUMBERS 1-12:18279} {days/wks/mos/yrs:310907} and {ACTION; IS/IS LOV:56433295} significantly bothersome. In terms of treatment, She has tried ***.  She {Actions; denies-reports:120008} caffeine intake.  She {Actions; denies-reports:120008} history of pyelonephritis.  She {Actions;  denies-reports:120008} history of kidney stones.  Vaginal / prolapse Symptoms: She {Actions; denies-reports:120008} vaginal bulge sensation.  She {Actions; denies-reports:120008} seeing a vaginal bulge. This bulge is bothersome and is the size of ***. It was first noticed ***.  She {Actions; denies-reports:120008} vaginal pain, bleeding, or discharge.  She {Actions; denies-reports:120008} use of topical vaginal estrogen cream.  Bowel Symptoms: She has a continent bowel movement *** time(s) per ***. Typical Bristol stool scale of continent bowel episodes is Type ***. She {Actions; denies-reports:120008} Fl episodes. Fecal incontinence started *** and occurs *** times per week. Typical Bristol stool scale of incontinent bowel episodes is Type ***. She {Actions; denies-reports:120008} straining and {Actions; denies-reports:120008} splinting to defecate. She {Actions; denies-reports:120008} rectal bleeding. She {Actions; denies-reports:120008} feeling like she fully empties her rectum with defecation. She {Actions; denies-reports:120008} urgency with defecation. She {Actions; denies-reports:120008} difficulty wiping clean. She {Actions; denies-reports:120008} taking laxatives, stool softeners, or fiber supplements. Last colonoscopy was***.  Past OB/GYN History: OB History     Gravida  0   Para  0   Term  0   Preterm  0   AB  0   Living  0      SAB  0   IAB  0   Ectopic  0   Multiple  0   Live Births             She {Actions; denies-reports:120008} being sexually active.  She {Actions; denies-reports:120008} dyspareunia. ***Dyspareunia is located ***at the introitus / ***deep inside the vagina. She has *** child(ren) who were delivered ***vaginally ***via c-section. She {Actions; denies-reports:120008} significant tearing with that ***delivery / those ***deliveries. She is {DESC; PRE/POST:32304} menopausal.  Last pap smear was ***. She {Actions; denies-reports:120008}  history of *** hysterectomy.  Fall Screening: Do you usually have a device to assist in your mobility? {yes/no:20286} ***cane / ***walker / ***wheelchair   Medications: Current Outpatient Medications  Medication Sig Dispense Refill   ALPRAZolam Prudy Feeler)  0.25 MG tablet TAKE 1 TABLET BY MOUTH 2 TIMES DAILY AS NEEDED FOR ANXIETY. 45 tablet 2   cetirizine (ZYRTEC ALLERGY) 10 MG tablet Take 1 tablet (10 mg total) by mouth daily. 90 tablet 1   escitalopram (LEXAPRO) 10 MG tablet Take 1 tablet (10 mg total) by mouth daily. 90 tablet 3   fluticasone (FLONASE) 50 MCG/ACT nasal spray SPRAY 2 SPRAYS INTO EACH NOSTRIL EVERY DAY 48 mL 2   gabapentin (NEURONTIN) 100 MG capsule Take 1 capsule (100 mg total) by mouth 3 (three) times daily. 90 capsule 3   metroNIDAZOLE (METROGEL) 0.75 % vaginal gel Place 1 Applicatorful vaginally 2 (two) times daily. 70 g 0   naproxen (NAPROSYN) 500 MG tablet TAKE 1 TABLET BY MOUTH 2 TIMES DAILY WITH A MEAL. 60 tablet 1   No current facility-administered medications for this visit.    Allergies: Allergies  Allergen Reactions   Ciprofloxacin     Makes her feel weak,dizzy   Imitrex [Sumatriptan]     Blurred vision    Penicillins    Prednisone Nausea And Vomiting   Tylenol [Acetaminophen]     Tylenol #3,Headaches   Vicodin [Hydrocodone-Acetaminophen]     GI upset   Promethazine Nausea And Vomiting    Past Medical History:  Diagnosis Date   Anxiety    Arthritis    Depression    Menopausal symptom    Migraine    Scoliosis    Past Surgical History:  Procedure Laterality Date   KNEE SURGERY Left 1974 and 1979   Family History  Problem Relation Age of Onset   Epilepsy Mother    COPD Father    Hyperlipidemia Father    Heart disease Father    Cancer Father    COPD Sister    Anxiety disorder Sister    COPD Maternal Aunt    Diabetes Maternal Uncle    Diabetes Maternal Aunt        x2   Colon cancer Neg Hx    Colon polyps Neg Hx    Esophageal cancer  Neg Hx    Gallbladder disease Neg Hx    Social History   Socioeconomic History   Marital status: Divorced    Spouse name: Not on file   Number of children: 0   Years of education: 12th   Highest education level: High school graduate  Occupational History   Occupation: [employer]    Comment: Back : [employer]nee surgery   Tobacco Use   Smoking status: Never   Smokeless tobacco: Never  Vaping Use   Vaping status: Never Used  Substance and Sexual Activity   Alcohol use: No   Drug use: No   Sexual activity: Not Currently    Partners: Male    Birth control/protection: None  Other Topics Concern   Not on file  Social History Narrative   Single, no children. Right handed. 12 th grade. No caffeine.   Divorced since 2004.   Social Determinants of Health   Financial Resource Strain: Low Risk  (04/05/2023)   Overall Financial Resource Strain (CARDIA)    Difficulty of Paying Living Expenses: Not hard at all  Food Insecurity: No Food Insecurity (04/05/2023)   Hunger Vital Sign    Worried About Running Out of Food in the Last Year: Never true    Ran Out of Food in the Last Year: Never true  Transportation Needs: No Transportation Needs (04/05/2023)   PRAPARE - Administrator, Civil Service (Medical):  No    Lack of Transportation (Non-Medical): No  Physical Activity: Insufficiently Active (04/05/2023)   Exercise Vital Sign    Days of Exercise per Week: 3 days    Minutes of Exercise per Session: 30 min  Stress: No Stress Concern Present (04/05/2023)   [employer] of Occupational Health - Occupational Stress Questionnaire    Feeling of Stress : Not at all  Social Connections: Moderately Isolated (04/05/2023)   Social Connection and Isolation Panel [NHANES]    Frequency of Communication with Friends and Family: More than three times a week    Frequency of Social Gatherings with Friends and Family: More than three times a week    Attends Religious Services: More than 4  times per year    Active Member of [employer] or Organizations: No    Attends Banker Meetings: Never    Marital Status: Divorced  Catering manager Violence: Not At Risk (04/05/2023)   Humiliation, Afraid, Rape, and Kick questionnaire    Fear of Current or Ex-Partner: No    Emotionally Abused: No    Physically Abused: No    Sexually Abused: No    SUBJECTIVE  Review of Systems Constitutional: Patient ***denies any unintentional weight loss or change in strength lntegumentary: Patient ***denies any rashes or pruritus Eyes: Patient denies ***dry eyes ENT: Patient ***denies dry mouth Cardiovascular: Patient ***denies chest pain or syncope Respiratory: Patient ***denies shortness of breath Gastrointestinal: Patient ***denies nausea, vomiting, constipation, or diarrhea Musculoskeletal: Patient ***denies muscle cramps or weakness Neurologic: Patient ***denies convulsions or seizures Psychiatric: Patient ***denies memory problems Allergic/Immunologic: Patient ***denies recent allergic reaction(s) Hematologic/Lymphatic: Patient denies bleeding tendencies Endocrine: Patient ***denies heat/cold intolerance  GU: As per HPI.  OBJECTIVE There were no vitals filed for this visit. There is no height or weight on file to calculate BMI.  Physical Examination Constitutional: ***No obvious distress; patient is ***non-toxic appearing  Cardiovascular: ***No visible lower extremity edema.  Respiratory: The patient does ***not have audible wheezing/stridor; respirations do ***not appear labored  Gastrointestinal: Abdomen ***non-distended Musculoskeletal: ***Normal ROM of UEs  Skin: ***No obvious rashes/open sores  Neurologic: CN 2-12 grossly ***intact Psychiatric: Answered questions ***appropriately with ***normal affect  Hematologic/Lymphatic/Immunologic: ***No obvious bruises or sites of spontaneous bleeding  UA: {Desc; negative/positive:13464} for *** WBC/hpf, *** RBC/hpf, bacteria  (***) PVR: *** ml  ASSESSMENT No diagnosis found.  ***Recurrent UTls / ***History of UTls with insufficient urine culture data to formally validate recurrent UTI diagnosis: *** 2. ***Vaginal atrophy: ***  Will plan for follow up in *** months or sooner if needed. Pt verbalized understanding and agreement. All questions were answered.  PLAN Advised the following: 1. *** 2. ***No follow-ups on file.  No orders of the defined types were placed in this encounter.   It has been explained that the patient is to follow regularly with their PCP in addition to all other providers involved in their care and to follow instructions provided by these respective offices. Patient advised to contact urology clinic if any urologic-pertaining questions, concerns, new symptoms or problems arise in the interim period.  There are no Patient Instructions on file for this visit.  Electronically signed by:  Donnita Falls, MSN, FNP-C, CUNP 08/09/2023 1:22 PM

## 2023-08-16 ENCOUNTER — Ambulatory Visit: Payer: 59 | Admitting: Urology

## 2023-08-16 DIAGNOSIS — N390 Urinary tract infection, site not specified: Secondary | ICD-10-CM

## 2023-08-18 ENCOUNTER — Ambulatory Visit: Payer: 59 | Admitting: Urology

## 2023-08-26 ENCOUNTER — Encounter: Payer: Self-pay | Admitting: Family Medicine

## 2023-08-26 ENCOUNTER — Ambulatory Visit (INDEPENDENT_AMBULATORY_CARE_PROVIDER_SITE_OTHER): Payer: 59 | Admitting: Family Medicine

## 2023-08-26 VITALS — BP 123/77 | HR 84 | Temp 98.1°F | Ht 62.0 in | Wt 149.4 lb

## 2023-08-26 DIAGNOSIS — K5900 Constipation, unspecified: Secondary | ICD-10-CM

## 2023-08-26 DIAGNOSIS — R3989 Other symptoms and signs involving the genitourinary system: Secondary | ICD-10-CM | POA: Diagnosis not present

## 2023-08-26 DIAGNOSIS — R300 Dysuria: Secondary | ICD-10-CM | POA: Diagnosis not present

## 2023-08-26 DIAGNOSIS — R1030 Lower abdominal pain, unspecified: Secondary | ICD-10-CM

## 2023-08-26 LAB — URINALYSIS, ROUTINE W REFLEX MICROSCOPIC
Bilirubin, UA: NEGATIVE
Glucose, UA: NEGATIVE
Ketones, UA: NEGATIVE
Leukocytes,UA: NEGATIVE
Nitrite, UA: NEGATIVE
Protein,UA: NEGATIVE
Specific Gravity, UA: 1.02 (ref 1.005–1.030)
Urobilinogen, Ur: 0.2 mg/dL (ref 0.2–1.0)
pH, UA: 5 (ref 5.0–7.5)

## 2023-08-26 LAB — MICROSCOPIC EXAMINATION
Renal Epithel, UA: NONE SEEN /HPF
Yeast, UA: NONE SEEN

## 2023-08-26 NOTE — Progress Notes (Signed)
   Acute Office Visit  Subjective:     Patient ID: Sandra Adams, female    DOB: 05-Apr-1966, 57 y.o.   MRN: [id number]  Chief Complaint  Patient presents with   dark urine    HPI Patient is in today for dark yellow urine x 2 days. She also reprots some mild lower abdominal discomfort. She denies dysuria, frequency, urgency, hematuria, fever, chills. She has not been drinking much water. She has a bowel movement every few days.   ROS As per HPI.      Objective:    BP 123/77   Pulse 84   Temp 98.1 F (36.7 C) (Temporal)   Ht 5\' 2"  (1.575 m)   Wt 149 lb 6 oz (67.8 kg)   LMP 10/16/2014   SpO2 97%   BMI 27.32 kg/m    Physical Exam Vitals and nursing note reviewed.  Constitutional:      General: She is not in acute distress.    Appearance: She is not ill-appearing, toxic-appearing or diaphoretic.  Cardiovascular:     Rate and Rhythm: Normal rate and regular rhythm.     Heart sounds: Normal heart sounds. No murmur heard. Pulmonary:     Effort: Pulmonary effort is normal. No respiratory distress.     Breath sounds: Normal breath sounds.  Abdominal:     General: Bowel sounds are normal. There is no distension.     Palpations: Abdomen is soft.     Tenderness: There is no abdominal tenderness. There is no right CVA tenderness, left CVA tenderness, guarding or rebound.  Musculoskeletal:     Right lower leg: No edema.     Left lower leg: No edema.  Skin:    General: Skin is warm and dry.  Neurological:     General: No focal deficit present.     Mental Status: She is alert and oriented to person, place, and time.  Psychiatric:        Mood and Affect: Mood normal.        Behavior: Behavior normal.     No results found for any visits on 08/26/23.      Assessment & Plan:   Chancey was seen today for dark urine.  Diagnoses and all orders for this visit:  Lower abdominal pain Dark yellow urine Constipation UA not compelling for UTI today. Culture pending.  Increase hydration. Discussed abdominal pain due to constipation. Discussed hydration, stool softeners, fiber intake for constipation.  -     Urinalysis, Routine w reflex microscopic -     Urine Culture  Return to office for new or worsening symptoms, or if symptoms persist.   The patient indicates understanding of these issues and agrees with the plan.  Gabriel Earing, FNP

## 2023-08-28 LAB — URINE CULTURE

## 2023-09-02 ENCOUNTER — Other Ambulatory Visit: Payer: Self-pay | Admitting: Family

## 2023-09-05 NOTE — H&P (Signed)
  Patient: Sandra Adams  PID: [id number]  DOB: 1966-12-10  SEX: Female   Patient referred by DDS for extraction teeth2, 4, 5, 7, 8, 9, 10, 12, 14, 21, 28.  CC: Bad teeth   Past Medical History:  Bruise Easily, Night Sweats, Anxiety    Medications: Alprazolam    Allergies:     Codeine    Surgeries:   knee surgery     Social History       Smoking:  n          Alcohol:n Drug use:n                             Exam: BMI 27. Decay  2, 4, 5, 7, 8, 9, 10, 12, 14, 21, 28. Right lingual torus.   No purulence, edema, fluctuance, trismus. Oral cancer screening negative. Pharynx clear. No lymphadenopathy.  Panorex:Decay  2, 4, 5, 7, 8, 9, 10, 12, 14, 21, 28. Impacted # 16.   Assessment: ASA 2. Non-restorable  teeth # 2, 4, 5, 7, 8, 9, 10, 12, 14, 21, 28. Impacted # 16.              Plan: Extraction Teeth # 2, 4, 5, 7, 8, 9, 10, 12, 14, 16, 21, 28, Alveoloplasty, removal right mandibular lingual torus.   Hospital Day surgery.                 Rx: n               Risks and complications explained. Questions answered.   Georgia Lopes, DMD

## 2023-09-09 ENCOUNTER — Encounter (HOSPITAL_COMMUNITY): Admission: RE | Payer: Self-pay | Source: Home / Self Care

## 2023-09-09 ENCOUNTER — Ambulatory Visit (HOSPITAL_COMMUNITY): Admission: RE | Admit: 2023-09-09 | Payer: 59 | Source: Home / Self Care | Admitting: Oral Surgery

## 2023-09-09 SURGERY — DENTAL RESTORATION/EXTRACTIONS
Anesthesia: General

## 2023-09-15 ENCOUNTER — Ambulatory Visit: Payer: 59 | Admitting: Family

## 2023-09-16 ENCOUNTER — Ambulatory Visit: Payer: 59 | Admitting: Family

## 2023-09-21 ENCOUNTER — Other Ambulatory Visit: Payer: Self-pay | Admitting: Family

## 2023-09-22 ENCOUNTER — Ambulatory Visit: Payer: 59 | Admitting: Obstetrics & Gynecology

## 2023-09-28 ENCOUNTER — Encounter: Payer: Self-pay | Admitting: Family Medicine

## 2023-09-28 ENCOUNTER — Ambulatory Visit (INDEPENDENT_AMBULATORY_CARE_PROVIDER_SITE_OTHER): Payer: 59 | Admitting: Family Medicine

## 2023-09-28 VITALS — BP 123/77 | HR 94 | Temp 98.7°F | Ht 62.0 in | Wt 150.4 lb

## 2023-09-28 DIAGNOSIS — R300 Dysuria: Secondary | ICD-10-CM | POA: Diagnosis not present

## 2023-09-28 DIAGNOSIS — N760 Acute vaginitis: Secondary | ICD-10-CM | POA: Diagnosis not present

## 2023-09-28 LAB — URINALYSIS, ROUTINE W REFLEX MICROSCOPIC
Bilirubin, UA: NEGATIVE
Glucose, UA: NEGATIVE
Ketones, UA: NEGATIVE
Nitrite, UA: NEGATIVE
Protein,UA: NEGATIVE
Specific Gravity, UA: >1.03 — ABNORMAL HIGH (ref 1.005–1.030)
Urobilinogen, Ur: 0.2 mg/dL (ref 0.2–1.0)
pH, UA: 5 (ref 5.0–7.5)

## 2023-09-28 LAB — MICROSCOPIC EXAMINATION
Renal Epithel, UA: NONE SEEN /[HPF]
Yeast, UA: NONE SEEN

## 2023-09-28 LAB — WET PREP FOR TRICH, YEAST, CLUE
Clue Cell Exam: NEGATIVE
Trichomonas Exam: NEGATIVE
Yeast Exam: NEGATIVE

## 2023-09-28 NOTE — Progress Notes (Signed)
   Acute Office Visit  Subjective:     Patient ID: Sandra Adams, female    DOB: July 06, 1966, 57 y.o.   MRN: [id number]  Chief Complaint  Patient presents with   Dysuria   Vaginitis    Dysuria  This is a recurrent problem. Episode onset: 2 days. The problem occurs intermittently. The problem has been unchanged. The quality of the pain is described as burning. The pain is mild. There has been no fever. Associated symptoms include urgency. Pertinent negatives include no chills, discharge, flank pain, frequency, hematuria, hesitancy, nausea, possible pregnancy, sweats or vomiting. Associated symptoms comments: Vaginal itching- mild. No rash or lesions. She has tried nothing for the symptoms.   These has been recurrent issues for patients. She has had numerous negative urine cultures. She had an appointment with both urology and GYN but canceled both.   Review of Systems  Constitutional:  Negative for chills.  Gastrointestinal:  Negative for nausea and vomiting.  Genitourinary:  Positive for dysuria and urgency. Negative for flank pain, frequency, hematuria and hesitancy.        Objective:    BP 123/77   Pulse 94   Temp 98.7 F (37.1 C) (Temporal)   Ht 5\' 2"  (1.575 m)   Wt 150 lb 6 oz (68.2 kg)   LMP 10/16/2014   SpO2 97%   BMI 27.50 kg/m    Physical Exam Vitals and nursing note reviewed.  Constitutional:      General: She is not in acute distress.    Appearance: She is not ill-appearing, toxic-appearing or diaphoretic.  Pulmonary:     Effort: Pulmonary effort is normal. No respiratory distress.  Abdominal:     General: There is no distension.     Palpations: Abdomen is soft.     Tenderness: There is no abdominal tenderness. There is no right CVA tenderness, left CVA tenderness, guarding or rebound.  Musculoskeletal:     Right lower leg: No edema.     Left lower leg: No edema.  Skin:    General: Skin is warm and dry.  Neurological:     General: No focal deficit  present.     Mental Status: She is alert and oriented to person, place, and time.  Psychiatric:        Mood and Affect: Mood normal.        Behavior: Behavior normal.     Urine dipstick shows positive for RBC's and positive for leukocytes.  Micro exam: 6-10 WBC's per HPF, 0-2 RBC's per HPF, and few + bacteria.  Microscopic wet-mount exam shows negative for pathogens, normal epithelial cells.      Assessment & Plan:   Sandra Adams was seen today for dysuria and vaginitis.  Diagnoses and all orders for this visit:  Dysuria -     Urinalysis, Routine w reflex microscopic -     Urine Culture  Acute vaginitis -     WET PREP FOR TRICH, YEAST, CLUE -     Urine Culture   Negative UA and wet prep today. Urine culture pending. Mild symptoms. This have been recurrent symptoms for patient without any positive urine cultures. Discussed need to reschedule GYN and urology appts for further evaluation.   The patient indicates understanding of these issues and agrees with the plan.  Gabriel Earing, FNP

## 2023-09-29 LAB — URINE CULTURE

## 2023-09-30 ENCOUNTER — Ambulatory Visit (INDEPENDENT_AMBULATORY_CARE_PROVIDER_SITE_OTHER): Payer: 59 | Admitting: Family

## 2023-09-30 ENCOUNTER — Encounter: Payer: Self-pay | Admitting: Family

## 2023-09-30 ENCOUNTER — Ambulatory Visit (INDEPENDENT_AMBULATORY_CARE_PROVIDER_SITE_OTHER): Payer: 59

## 2023-09-30 VITALS — BP 115/73 | HR 75 | Temp 97.4°F | Ht 62.0 in | Wt 150.2 lb

## 2023-09-30 DIAGNOSIS — Z79899 Other long term (current) drug therapy: Secondary | ICD-10-CM

## 2023-09-30 DIAGNOSIS — G8929 Other chronic pain: Secondary | ICD-10-CM | POA: Diagnosis not present

## 2023-09-30 DIAGNOSIS — F411 Generalized anxiety disorder: Secondary | ICD-10-CM

## 2023-09-30 DIAGNOSIS — K219 Gastro-esophageal reflux disease without esophagitis: Secondary | ICD-10-CM

## 2023-09-30 DIAGNOSIS — N390 Urinary tract infection, site not specified: Secondary | ICD-10-CM | POA: Diagnosis not present

## 2023-09-30 DIAGNOSIS — R109 Unspecified abdominal pain: Secondary | ICD-10-CM

## 2023-09-30 DIAGNOSIS — M545 Low back pain, unspecified: Secondary | ICD-10-CM | POA: Diagnosis not present

## 2023-09-30 DIAGNOSIS — M150 Primary generalized (osteo)arthritis: Secondary | ICD-10-CM

## 2023-09-30 DIAGNOSIS — F331 Major depressive disorder, recurrent, moderate: Secondary | ICD-10-CM | POA: Diagnosis not present

## 2023-09-30 DIAGNOSIS — E663 Overweight: Secondary | ICD-10-CM

## 2023-09-30 MED ORDER — ALPRAZOLAM 0.25 MG PO TABS: 0.2500 mg | ORAL_TABLET | Freq: Two times a day (BID) | ORAL | 2 refills | Status: DC | PRN

## 2023-09-30 MED ORDER — BACLOFEN 10 MG PO TABS
10.0000 mg | ORAL_TABLET | Freq: Three times a day (TID) | ORAL | 0 refills | Status: DC
Start: 2023-09-30 — End: 2024-07-03

## 2023-09-30 MED ORDER — OMEPRAZOLE 40 MG PO CPDR
40.0000 mg | DELAYED_RELEASE_CAPSULE | Freq: Every day | ORAL | 3 refills | Status: DC
Start: 2023-09-30 — End: 2023-12-26

## 2023-09-30 MED ORDER — DICLOFENAC SODIUM 75 MG PO TBEC
75.0000 mg | DELAYED_RELEASE_TABLET | Freq: Two times a day (BID) | ORAL | 2 refills | Status: DC
Start: 2023-09-30 — End: 2024-07-03

## 2023-09-30 NOTE — Progress Notes (Signed)
Subjective:    Patient ID: Sandra Adams, female    DOB: 06/03/1966, 57 y.o.   MRN: [id number]  Chief Complaint  Patient presents with   Medical Management of Chronic Issues    3 month   Flank Pain    Left side pain for 3 weeks. Sharp pain   Pt presents to the office today with flank pain for three weeks and chronic follow up.  Flank Pain This is a new problem. The current episode started 1 to 4 weeks ago. The problem occurs intermittently. Pain location: left flank pain. The quality of the pain is described as aching. The pain is at a severity of 8/10. The pain is moderate. Exacerbated by: after eating. Pertinent negatives include no dysuria or fever. She has tried NSAIDs for the symptoms. The treatment provided mild relief.  Gastroesophageal Reflux She complains of belching and heartburn. This is a chronic problem. The current episode started more than 1 year ago. The problem occurs occasionally. She has tried a PPI for the symptoms. The treatment provided moderate relief.  Arthritis Presents for follow-up visit. She complains of pain and stiffness. The symptoms have been stable. Affected locations include the right knee, left knee, right MCP and left MCP. Her pain is at a severity of 4/10. Pertinent negatives include no dysuria or fever.  Anxiety Presents for follow-up visit. Symptoms include excessive worry, nervous/anxious behavior and restlessness. Symptoms occur occasionally. The severity of symptoms is mild.    Depression        This is a chronic problem.  The current episode started more than 1 year ago.   The problem occurs intermittently.  Associated symptoms include restlessness and sad.  Associated symptoms include no helplessness and no hopelessness.  Past treatments include SSRIs - Selective serotonin reuptake inhibitors.  Past medical history includes anxiety.       Review of Systems  Constitutional:  Negative for fever.  Gastrointestinal:  Positive for heartburn.   Genitourinary:  Positive for flank pain. Negative for dysuria.  Musculoskeletal:  Positive for arthritis and stiffness.  Psychiatric/Behavioral:  Positive for depression. The patient is nervous/anxious.   All other systems reviewed and are negative.      Objective:   Physical Exam Vitals reviewed.  Constitutional:      General: She is not in acute distress.    Appearance: She is well-developed. She is obese.  HENT:     Head: Normocephalic and atraumatic.     Right Ear: Tympanic membrane normal.     Left Ear: Tympanic membrane normal.  Eyes:     Pupils: Pupils are equal, round, and reactive to light.  Neck:     Thyroid: No thyromegaly.  Cardiovascular:     Rate and Rhythm: Normal rate and regular rhythm.     Heart sounds: Normal heart sounds. No murmur heard. Pulmonary:     Effort: Pulmonary effort is normal. No respiratory distress.     Breath sounds: Normal breath sounds. No wheezing.  Abdominal:     General: Bowel sounds are normal. There is no distension.     Palpations: Abdomen is soft.     Tenderness: There is no abdominal tenderness.  Musculoskeletal:        General: No tenderness. Normal range of motion.       Arms:     Cervical back: Normal range of motion and neck supple.     Comments: Full ROM of lumbar, slight left flank pain with rotation, negative CVA tenderness  Skin:    General: Skin is warm and dry.  Neurological:     Mental Status: She is alert and oriented to person, place, and time.     Cranial Nerves: No cranial nerve deficit.     Deep Tendon Reflexes: Reflexes are normal and symmetric.  Psychiatric:        Behavior: Behavior normal.        Thought Content: Thought content normal.        Judgment: Judgment normal.       BP 115/73   Pulse 75   Temp (!) 97.4 F (36.3 C) (Temporal)   Ht 5\' 2"  (1.575 m)   Wt 150 lb 3.2 oz (68.1 kg)   LMP 10/16/2014   SpO2 95%   BMI 27.47 kg/m      Assessment & Plan:   Sandra Adams comes in today  with chief complaint of Medical Management of Chronic Issues (3 month) and Flank Pain (Left side pain for 3 weeks. Sharp pain)   Diagnosis and orders addressed:  1. Chronic bilateral low back pain without sciatica - CBC with Differential/Platelet - CMP14+EGFR - DG Lumbar Spine 2-3 Views  2. Controlled substance agreement signed - CBC with Differential/Platelet - CMP14+EGFR - ALPRAZolam (XANAX) 0.25 MG tablet; Take 1 tablet (0.25 mg total) by mouth 2 (two) times daily as needed for anxiety.  Dispense: 45 tablet; Refill: 2  3. Moderate episode of recurrent major depressive disorder (HCC) - CBC with Differential/Platelet - CMP14+EGFR  4. GAD (generalized anxiety disorder) - CBC with Differential/Platelet - CMP14+EGFR - ALPRAZolam (XANAX) 0.25 MG tablet; Take 1 tablet (0.25 mg total) by mouth 2 (two) times daily as needed for anxiety.  Dispense: 45 tablet; Refill: 2  5. Gastroesophageal reflux disease without esophagitis Will increase omeprazole to 40 mg from 20 mg  -Diet discussed- Avoid fried, spicy, citrus foods, caffeine and alcohol -Do not eat 2-3 hours before bedtime -Encouraged small frequent meals -RTO prn - CBC with Differential/Platelet - CMP14+EGFR - omeprazole (PRILOSEC) 40 MG capsule; Take 1 capsule (40 mg total) by mouth daily.  Dispense: 30 capsule; Refill: 3  6. Primary osteoarthritis involving multiple joints - CBC with Differential/Platelet - CMP14+EGFR  7. Overweight (BMI 25.0-29.9) - CBC with Differential/Platelet - CMP14+EGFR - ALPRAZolam (XANAX) 0.25 MG tablet; Take 1 tablet (0.25 mg total) by mouth 2 (two) times daily as needed for anxiety.  Dispense: 45 tablet; Refill: 2  8. Left flank pain Stop naprosyn and start diclofenac 75 mg BID with food for 7-10 days  Baclofen Prescription sent to pharmacy, sedation precautions discussed  - CBC with Differential/Platelet - CMP14+EGFR - DG Lumbar Spine 2-3 Views - baclofen (LIORESAL) 10 MG tablet; Take 1  tablet (10 mg total) by mouth 3 (three) times daily.  Dispense: 30 each; Refill: 0 - diclofenac (VOLTAREN) 75 MG EC tablet; Take 1 tablet (75 mg total) by mouth 2 (two) times daily.  Dispense: 60 tablet; Refill: 2  9. Frequent UTI Referral to Urologists, discussed importance of keep follow up - Ambulatory referral to Urology - CBC with Differential/Platelet - CMP14+EGFR   Labs pending Patient reviewed in [place] controlled database, no flags noted. Contract and drug screen are up to date.  Continue current medications  Will increase PPI to 40 mg from 20 mg  Health Maintenance reviewed Diet and exercise encouraged  Follow up plan: 3 months    Jannifer Rodney, FNP

## 2023-09-30 NOTE — Patient Instructions (Signed)
Flank Pain, Adult  Flank pain is pain that is located on the side of the body between the upper abdomen and the spine. This area is called the flank. The pain may occur over a short period of time (acute), or it may be long-term or recurring (chronic). It may be mild or severe. Flank pain can be caused by many things, including:  Muscle soreness or injury.  Kidney infection, kidney stones, or kidney disease.  Stress.  A disease of the spine (vertebral disk disease).  A lung infection (pneumonia).  Fluid around the lungs (pulmonary edema).  A skin rash caused by the chickenpox virus (shingles).  Tumors that affect the back of the abdomen.  Gallbladder disease.  Follow these instructions at home:    Drink enough fluid to keep your urine pale yellow.  Rest as told by your health care provider.  Take over-the-counter and prescription medicines only as told by your health care provider.  Keep a journal to track what has caused your flank pain and what has made it feel better.  Keep all follow-up visits. This is important.  Contact a health care provider if:  Your pain is not controlled with medicine.  You have new symptoms.  Your pain gets worse.  Your symptoms last longer than 2-3 days.  You have trouble urinating or you are urinating very frequently.  Get help right away if:  You have trouble breathing or you are short of breath.  Your abdomen hurts or it is swollen or red.  You have nausea or vomiting.  You feel faint, or you faint.  You have blood in your urine.  You have flank pain and a fever.  These symptoms may represent a serious problem that is an emergency. Do not wait to see if the symptoms will go away. Get medical help right away. Call your local emergency services (911 in the [place] Do not drive yourself to the hospital.  Summary  Flank pain is pain that is located on the side of the body between the upper abdomen and the spine.  The pain may occur over a short period of time (acute), or it may be  long-term or recurring (chronic). It may be mild or severe.  Flank pain can be caused by many things.  Contact your health care provider if your symptoms get worse or last longer than 2-3 days.  This information is not intended to replace advice given to you by your health care provider. Make sure you discuss any questions you have with your health care provider.  Document Revised: 02/16/2021 Document Reviewed: 02/16/2021  Elsevier Patient Education  2024 [employer].

## 2023-10-01 LAB — CBC WITH DIFFERENTIAL/PLATELET
Basophils Absolute: 0.1 10*3/uL (ref 0.0–0.2)
Basos: 1 %
EOS (ABSOLUTE): 0.2 10*3/uL (ref 0.0–0.4)
Eos: 2 %
Hematocrit: 43.6 % (ref 34.0–46.6)
Hemoglobin: 14.6 g/dL (ref 11.1–15.9)
Immature Grans (Abs): 0 10*3/uL (ref 0.0–0.1)
Immature Granulocytes: 0 %
Lymphocytes Absolute: 4.3 10*3/uL — ABNORMAL HIGH (ref 0.7–3.1)
Lymphs: 41 %
MCH: 32.3 pg (ref 26.6–33.0)
MCHC: 33.5 g/dL (ref 31.5–35.7)
MCV: 97 fL (ref 79–97)
Monocytes Absolute: 0.7 10*3/uL (ref 0.1–0.9)
Monocytes: 7 %
Neutrophils Absolute: 5.1 10*3/uL (ref 1.4–7.0)
Neutrophils: 49 %
Platelets: 234 10*3/uL (ref 150–450)
RBC: 4.52 x10E6/uL (ref 3.77–5.28)
RDW: 12.6 % (ref 11.7–15.4)
WBC: 10.4 10*3/uL (ref 3.4–10.8)

## 2023-10-01 LAB — CMP14+EGFR
ALT: 34 [IU]/L — ABNORMAL HIGH (ref 0–32)
AST: 25 [IU]/L (ref 0–40)
Albumin: 4.8 g/dL (ref 3.8–4.9)
Alkaline Phosphatase: 124 [IU]/L — ABNORMAL HIGH (ref 44–121)
BUN/Creatinine Ratio: 10 (ref 9–23)
BUN: 8 mg/dL (ref 6–24)
Bilirubin Total: 0.5 mg/dL (ref 0.0–1.2)
CO2: 24 mmol/L (ref 20–29)
Calcium: 9.7 mg/dL (ref 8.7–10.2)
Chloride: 103 mmol/L (ref 96–106)
Creatinine, Ser: 0.77 mg/dL (ref 0.57–1.00)
Globulin, Total: 2.6 g/dL (ref 1.5–4.5)
Glucose: 104 mg/dL — ABNORMAL HIGH (ref 70–99)
Potassium: 4.4 mmol/L (ref 3.5–5.2)
Sodium: 143 mmol/L (ref 134–144)
Total Protein: 7.4 g/dL (ref 6.0–8.5)
eGFR: 89 mL/min/{1.73_m2} (ref >59)

## 2023-10-06 ENCOUNTER — Ambulatory Visit: Payer: 59 | Admitting: Nurse Practitioner

## 2023-10-06 NOTE — Progress Notes (Deleted)
   Acute Office Visit  Subjective:     Patient ID: JADIA CAPERS, female    DOB: October 07, 1966, 57 y.o.   MRN: [id number]  No chief complaint on file.   HPI  ROS Negative unless indicated in HPI    Objective:    LMP 10/16/2014  BP Readings from Last 3 Encounters:  09/30/23 115/73  09/28/23 123/77  08/26/23 123/77   Wt Readings from Last 3 Encounters:  09/30/23 150 lb 3.2 oz (68.1 kg)  09/28/23 150 lb 6 oz (68.2 kg)  08/26/23 149 lb 6 oz (67.8 kg)      Physical Exam  No results found for any visits on 10/06/23.      Assessment & Plan:  There are no diagnoses linked to this encounter.  No follow-ups on file.  Arrie Aran Santa Lighter, DNP [facility] [address] [place], [place] [address] [phone]

## 2023-10-13 ENCOUNTER — Ambulatory Visit: Payer: 59 | Admitting: Obstetrics & Gynecology

## 2023-10-27 ENCOUNTER — Encounter: Payer: 59 | Admitting: Obstetrics & Gynecology

## 2023-10-27 ENCOUNTER — Ambulatory Visit: Payer: 59 | Admitting: Urology

## 2023-10-28 NOTE — Progress Notes (Deleted)
Name: Sandra Adams DOB: [birth year] MRN: [id number]  History of Present Illness: Sandra Adams is a 57 y.o. female who presents today as a new patient at [facility] . All available relevant medical records have been reviewed.   She reports chief complaint of recurrent UTls.  Urine culture results in past 12 months: - 02/09/2023: Negative - 05/02/2023: Negative - 06/08/2023: Positive for >100k Streptococcus species - 06/28/2023: Negative - 07/07/2023: Positive for E. Coli - 07/26/2023: Negative - 08/26/2023: Negative - 09/28/2023: Negative  Urinary Symptoms: She reports *** UTl's in the last year. When present, UTI symptoms include ***dysuria, ***increased urinary urgency, ***frequency, ***. She reports that UTI symptoms do ***not seem to correlate with intercourse. She {Actions; denies-reports:120008} acute UTI symptoms today.  At baseline: She {Actions; denies-reports:120008} urinary urgency, frequency, dysuria, gross hematuria, hesitancy, straining to void, or sensations of incomplete emptying. She reports voiding *** times per day and *** times at night. She {Actions; denies-reports:120008} pushing on a bulge in order to empty her bladder.  She {Actions; denies-reports:120008} urge incontinence. She {Actions; denies-reports:120008} stress incontinence with ***cough/***laugh/***sneeze/***heavy lifting/***exercise. She {Actions; denies-reports:120008} enuresis. She leaks *** times per ***. Wears *** ***pads / ***diapers per day. She states the ***SUI / ***UUI is predominant. This has been going on for {NUMBERS 1-12:18279} {days/wks/mos/yrs:310907} and {ACTION; IS/IS WUJ:81191478} significantly bothersome. In terms of treatment, She has tried ***.  She {Actions; denies-reports:120008} caffeine intake.  She {Actions; denies-reports:120008} history of pyelonephritis.  She {Actions; denies-reports:120008} history of kidney stones.  Vaginal / prolapse  Symptoms: She {Actions; denies-reports:120008} vaginal bulge sensation.  She {Actions; denies-reports:120008} seeing a vaginal bulge. This bulge is bothersome and is the size of ***. It was first noticed ***.  She {Actions; denies-reports:120008} vaginal pain, bleeding, or discharge.  She {Actions; denies-reports:120008} use of topical vaginal estrogen cream.  Bowel Symptoms: She has a continent bowel movement *** time(s) per ***. Typical Bristol stool scale of continent bowel episodes is Type ***. She {Actions; denies-reports:120008} Fl episodes. Fecal incontinence started *** and occurs *** times per week. Typical Bristol stool scale of incontinent bowel episodes is Type ***. She {Actions; denies-reports:120008} straining and {Actions; denies-reports:120008} splinting to defecate. She {Actions; denies-reports:120008} rectal bleeding. She {Actions; denies-reports:120008} feeling like she fully empties her rectum with defecation. She {Actions; denies-reports:120008} urgency with defecation. She {Actions; denies-reports:120008} difficulty wiping clean. She {Actions; denies-reports:120008} taking laxatives, stool softeners, or fiber supplements. Last colonoscopy was***.  Past OB/GYN History: OB History     Gravida  0   Para  0   Term  0   Preterm  0   AB  0   Living  0      SAB  0   IAB  0   Ectopic  0   Multiple  0   Live Births             She {Actions; denies-reports:120008} being sexually active.  She {Actions; denies-reports:120008} dyspareunia. ***Dyspareunia is located ***at the introitus / ***deep inside the vagina. She has *** child(ren) who were delivered ***vaginally ***via c-section. She {Actions; denies-reports:120008} significant tearing with that ***delivery / those ***deliveries. She is {DESC; PRE/POST:32304} menopausal.  Last pap smear was ***. She {Actions; denies-reports:120008} history of *** hysterectomy.  Fall Screening: Do you usually have a  device to assist in your mobility? {yes/no:20286} ***cane / ***walker / ***wheelchair   Medications: Current Outpatient Medications  Medication Sig Dispense Refill   ALPRAZolam (XANAX) 0.25 MG tablet Take 1 tablet (0.25 mg total) by mouth  2 (two) times daily as needed for anxiety. 45 tablet 2   baclofen (LIORESAL) 10 MG tablet Take 1 tablet (10 mg total) by mouth 3 (three) times daily. 30 each 0   diclofenac (VOLTAREN) 75 MG EC tablet Take 1 tablet (75 mg total) by mouth 2 (two) times daily. 60 tablet 2   escitalopram (LEXAPRO) 10 MG tablet Take 1 tablet (10 mg total) by mouth daily. 90 tablet 3   fluticasone (FLONASE) 50 MCG/ACT nasal spray SPRAY 2 SPRAYS INTO EACH NOSTRIL EVERY DAY 48 mL 2   gabapentin (NEURONTIN) 100 MG capsule Take 1 capsule (100 mg total) by mouth 3 (three) times daily. 90 capsule 3   omeprazole (PRILOSEC) 40 MG capsule Take 1 capsule (40 mg total) by mouth daily. 30 capsule 3   No current facility-administered medications for this visit.    Allergies: Allergies  Allergen Reactions   Ciprofloxacin     Makes her feel weak,dizzy   Imitrex [Sumatriptan]     Blurred vision    Penicillins    Prednisone Nausea And Vomiting   Tylenol [Acetaminophen]     Tylenol #3,Headaches   Vicodin [Hydrocodone-Acetaminophen]     GI upset   Promethazine Nausea And Vomiting    Past Medical History:  Diagnosis Date   Anxiety    Arthritis    Depression    Menopausal symptom    Migraine    Scoliosis    Past Surgical History:  Procedure Laterality Date   KNEE SURGERY Left 1974 and 1979   Family History  Problem Relation Age of Onset   Epilepsy Mother    COPD Father    Hyperlipidemia Father    Heart disease Father    Cancer Father    COPD Sister    Anxiety disorder Sister    COPD Maternal Aunt    Diabetes Maternal Uncle    Diabetes Maternal Aunt        x2   Colon cancer Neg Hx    Colon polyps Neg Hx    Esophageal cancer Neg Hx    Gallbladder disease Neg Hx     Social History   Socioeconomic History   Marital status: Divorced    Spouse name: Not on file   Number of children: 0   Years of education: 12th   Highest education level: High school graduate  Occupational History   Occupation: [employer]    Comment: Back : [employer] Knee surgery   Tobacco Use   Smoking status: Never   Smokeless tobacco: Never  Vaping Use   Vaping status: Never Used  Substance and Sexual Activity   Alcohol use: No   Drug use: No   Sexual activity: Not Currently    Partners: Male    Birth control/protection: None  Other Topics Concern   Not on file  Social History Narrative   Single, no children. Right handed. 12 th grade. No caffeine.   Divorced since 2004.   Social Determinants of Health   Financial Resource Strain: Low Risk  (04/05/2023)   Overall Financial Resource Strain (CARDIA)    Difficulty of Paying Living Expenses: Not hard at all  Food Insecurity: No Food Insecurity (04/05/2023)   Hunger Vital Sign    Worried About Running Out of Food in the Last Year: Never true    Ran Out of Food in the Last Year: Never true  Transportation Needs: No Transportation Needs (04/05/2023)   PRAPARE - Administrator, Civil Service (Medical): No  Lack of Transportation (Non-Medical): No  Physical Activity: Insufficiently Active (04/05/2023)   Exercise Vital Sign    Days of Exercise per Week: 3 days    Minutes of Exercise per Session: 30 min  Stress: No Stress Concern Present (04/05/2023)   [employer] of Occupational Health - Occupational Stress Questionnaire    Feeling of Stress : Not at all  Social Connections: Moderately Isolated (04/05/2023)   Social Connection and Isolation Panel [NHANES]    Frequency of Communication with Friends and Family: More than three times a week    Frequency of Social Gatherings with Friends and Family: More than three times a week    Attends Religious Services: More than 4 times per year    Active Member of  [employer] or Organizations: No    Attends Banker Meetings: Never    Marital Status: Divorced  Catering manager Violence: Not At Risk (04/05/2023)   Humiliation, Afraid, Rape, and Kick questionnaire    Fear of Current or Ex-Partner: No    Emotionally Abused: No    Physically Abused: No    Sexually Abused: No    SUBJECTIVE  Review of Systems*** Constitutional: Patient denies any unintentional weight loss or change in strength lntegumentary: Patient denies any rashes or pruritus Eyes: Patient {Actions; denies-reports:120008} dry eyes ENT: Patient {Actions; denies-reports:120008} dry mouth Cardiovascular: Patient denies chest pain or syncope Respiratory: Patient denies shortness of breath Gastrointestinal: Patient ***denies nausea, vomiting, constipation, or diarrhea Musculoskeletal: Patient denies muscle cramps or weakness Neurologic: Patient denies convulsions or seizures Allergic/Immunologic: Patient denies recent allergic reaction(s) Hematologic/Lymphatic: Patient denies bleeding tendencies Endocrine: Patient denies heat/cold intolerance  GU: As per HPI.  OBJECTIVE There were no vitals filed for this visit. There is no height or weight on file to calculate BMI.  Physical Examination*** Constitutional: No obvious distress; patient is non-toxic appearing  Cardiovascular: No visible lower extremity edema.  Respiratory: The patient does not have audible wheezing/stridor; respirations do not appear labored  Gastrointestinal: Abdomen non-distended Musculoskeletal: Normal ROM of UEs  Skin: No obvious rashes/open sores  Neurologic: CN 2-12 grossly intact Psychiatric: Answered questions appropriately with normal affect  Hematologic/Lymphatic/Immunologic: No obvious bruises or sites of spontaneous bleeding  UA: ***negative *** WBC/hpf, *** RBC/hpf, bacteria (***) PVR: *** ml  ASSESSMENT No diagnosis found.  ***Recurrent UTls / ***History of UTls with insufficient  urine culture data to formally validate recurrent UTI diagnosis: *** 2. ***Vaginal atrophy: ***  Will plan for follow up in *** months or sooner if needed. Pt verbalized understanding and agreement. All questions were answered.  PLAN Advised the following: 1. *** 2. ***No follow-ups on file.  No orders of the defined types were placed in this encounter.   It has been explained that the patient is to follow regularly with their PCP in addition to all other providers involved in their care and to follow instructions provided by these respective offices. Patient advised to contact urology clinic if any urologic-pertaining questions, concerns, new symptoms or problems arise in the interim period.  There are no Patient Instructions on file for this visit.  Electronically signed by:  Donnita Falls, MSN, FNP-C, CUNP 10/28/2023 11:51 AM

## 2023-10-31 DIAGNOSIS — L729 Follicular cyst of the skin and subcutaneous tissue, unspecified: Secondary | ICD-10-CM | POA: Diagnosis not present

## 2023-10-31 DIAGNOSIS — N3001 Acute cystitis with hematuria: Secondary | ICD-10-CM | POA: Diagnosis not present

## 2023-10-31 DIAGNOSIS — R350 Frequency of micturition: Secondary | ICD-10-CM | POA: Diagnosis not present

## 2023-11-02 ENCOUNTER — Ambulatory Visit: Payer: 59 | Admitting: Urology

## 2023-11-02 DIAGNOSIS — N390 Urinary tract infection, site not specified: Secondary | ICD-10-CM

## 2023-11-03 ENCOUNTER — Ambulatory Visit: Payer: 59 | Admitting: Urology

## 2023-11-11 ENCOUNTER — Encounter (HOSPITAL_BASED_OUTPATIENT_CLINIC_OR_DEPARTMENT_OTHER): Payer: Self-pay

## 2023-11-11 ENCOUNTER — Ambulatory Visit (HOSPITAL_COMMUNITY): Admit: 2023-11-11 | Payer: 59 | Admitting: Oral Surgery

## 2023-11-11 SURGERY — DENTAL RESTORATION/EXTRACTIONS
Anesthesia: General

## 2023-11-24 DIAGNOSIS — K047 Periapical abscess without sinus: Secondary | ICD-10-CM | POA: Diagnosis not present

## 2023-11-27 ENCOUNTER — Other Ambulatory Visit: Payer: Self-pay | Admitting: Family

## 2023-11-27 DIAGNOSIS — F411 Generalized anxiety disorder: Secondary | ICD-10-CM

## 2023-11-27 DIAGNOSIS — F331 Major depressive disorder, recurrent, moderate: Secondary | ICD-10-CM

## 2023-12-01 ENCOUNTER — Encounter: Payer: 59 | Admitting: Obstetrics & Gynecology

## 2023-12-08 ENCOUNTER — Encounter: Payer: 59 | Admitting: Obstetrics & Gynecology

## 2023-12-10 DIAGNOSIS — S025XXB Fracture of tooth (traumatic), initial encounter for open fracture: Secondary | ICD-10-CM | POA: Diagnosis not present

## 2023-12-10 DIAGNOSIS — K029 Dental caries, unspecified: Secondary | ICD-10-CM | POA: Diagnosis not present

## 2023-12-19 ENCOUNTER — Ambulatory Visit: Payer: Self-pay | Admitting: Family

## 2023-12-19 NOTE — Telephone Encounter (Signed)
  Chief Complaint: UTI symptoms Symptoms: pain in back and urinary tract Frequency: constant Pertinent Negatives: Patient denies fever Disposition: [] ED /[x] Urgent Care (no appt availability in office) / [] Appointment(In office/virtual)/ []  [place] [facility]/ [] Home Care/ [] Refused Recommended Disposition /[] [place] [facility]/ []  Follow-up with PCP Additional Notes: Patient called in stating she is having symptoms she believes to be UTI. Also states she has been on Amoxicillin recently due to a tooth infection. Patient denies any burning upon urination but states she is having flank pain and abnormal smell with urination. Also noticed she is urinating less. Advised patient to get seen at [facility] asap since there is no availability with PCP. Patient states she will monitor symptoms at home and go to UC if she feels worse.    Copied from CRM [id number]. Topic: Clinical - Pink Word Triage >> Dec 19, 2023 10:39 AM Clayton Bibles wrote: Reason for Triage: She thinks she has a UTI and wants a nurse to check her urine. She is weak and hurting lower right side. She had one about 4 weeks ago Reason for Disposition  Side (flank) or lower back pain present  Answer Assessment - Initial Assessment Questions 1. SYMPTOM: "What's the main symptom you're concerned about?" (e.g., frequency, incontinence)     Weakness, pain in urinary tract and back, believed to have UTI since she has frequent UTI 2. ONSET: "When did the   start?"     Started feeling about 2 days before christmas 3. PAIN: "Is there any pain?" If Yes, ask: "How bad is it?" (Scale: 1-10; mild, moderate, severe)     Abdominal and pain in sides 4. CAUSE: "What do you think is causing the symptoms?"     UTI 5. OTHER SYMPTOMS: "Do you have any other symptoms?" (e.g., blood in urine, fever, flank pain, pain with urination)     Weakness, hurting on back and urinary tract, decreased urination  Protocols used: Urinary Symptoms-A-AH

## 2023-12-25 ENCOUNTER — Other Ambulatory Visit: Payer: Self-pay | Admitting: Family

## 2023-12-25 DIAGNOSIS — K219 Gastro-esophageal reflux disease without esophagitis: Secondary | ICD-10-CM

## 2023-12-26 ENCOUNTER — Ambulatory Visit: Payer: Self-pay | Admitting: Family

## 2023-12-26 ENCOUNTER — Ambulatory Visit (INDEPENDENT_AMBULATORY_CARE_PROVIDER_SITE_OTHER): Payer: 59 | Admitting: Nurse Practitioner

## 2023-12-26 ENCOUNTER — Encounter: Payer: Self-pay | Admitting: Nurse Practitioner

## 2023-12-26 VITALS — BP 130/71 | HR 90 | Temp 97.2°F | Ht 61.0 in | Wt 152.0 lb

## 2023-12-26 DIAGNOSIS — R319 Hematuria, unspecified: Secondary | ICD-10-CM | POA: Diagnosis not present

## 2023-12-26 DIAGNOSIS — N3000 Acute cystitis without hematuria: Secondary | ICD-10-CM

## 2023-12-26 LAB — URINALYSIS, COMPLETE
Bilirubin, UA: NEGATIVE
Glucose, UA: NEGATIVE
Ketones, UA: NEGATIVE
Nitrite, UA: NEGATIVE
Protein,UA: NEGATIVE
Specific Gravity, UA: 1.025 (ref 1.005–1.030)
Urobilinogen, Ur: 0.2 mg/dL (ref 0.2–1.0)
pH, UA: 5.5 (ref 5.0–7.5)

## 2023-12-26 LAB — MICROSCOPIC EXAMINATION
Renal Epithel, UA: NONE SEEN /[HPF]
Yeast, UA: NONE SEEN

## 2023-12-26 MED ORDER — SULFAMETHOXAZOLE-TRIMETHOPRIM 800-160 MG PO TABS
1.0000 | ORAL_TABLET | Freq: Two times a day (BID) | ORAL | 0 refills | Status: DC
Start: 2023-12-26 — End: 2024-01-10

## 2023-12-26 NOTE — Progress Notes (Signed)
   Subjective:    Patient ID: Sandra Adams, female    DOB: January 22, 1966, 58 y.o.   MRN: [id number]   Chief Complaint: Urinary Tract Infection   Urinary Tract Infection  This is a new problem. The current episode started in the past 7 days. The problem occurs intermittently. The problem has been waxing and waning. The pain is at a severity of 4/10. The pain is mild. There has been no fever. She is Sexually active. There is No history of pyelonephritis. Pertinent negatives include no discharge, frequency, hesitancy or urgency. She has tried nothing for the symptoms. The treatment provided no relief.    Patient Active Problem List   Diagnosis Date Noted   Osteoarthritis 11/06/2019   Osteopenia 07/21/2017   Chronic back pain 12/31/2016   Controlled substance agreement signed 12/31/2016   Overweight (BMI 25.0-29.9) 07/02/2016   GERD (gastroesophageal reflux disease) 10/01/2014   GAD (generalized anxiety disorder) 10/01/2014   Depression 10/01/2014   Menopausal hot flushes 09/16/2014   Dysmenorrhea 05/20/2014   Migraine without aura 03/27/2014       Review of Systems  Genitourinary:  Positive for dysuria. Negative for frequency, hesitancy and urgency.   UA 1+ leuks    Objective:   Physical Exam Constitutional:      Appearance: Normal appearance.  Cardiovascular:     Rate and Rhythm: Normal rate and regular rhythm.     Heart sounds: Normal heart sounds.  Pulmonary:     Effort: Pulmonary effort is normal.     Breath sounds: Normal breath sounds.  Abdominal:     Tenderness: There is no abdominal tenderness. There is no right CVA tenderness or left CVA tenderness.  Skin:    General: Skin is warm.  Neurological:     General: No focal deficit present.     Mental Status: She is alert and oriented to person, place, and time.  Psychiatric:        Mood and Affect: Mood normal.        Behavior: Behavior normal.    BP 130/71   Pulse 90   Temp (!) 97.2 F (36.2 C) (Oral)    Ht 5' 1 (1.549 m)   Wt 152 lb (68.9 kg)   LMP 10/16/2014   SpO2 97%   BMI 28.72 kg/m         Assessment & Plan:   Sandra Adams in today with chief complaint of Urinary Tract Infection   1. Hematuria, unspecified type (Primary) - Urinalysis, Complete - Urine Culture  2. Acute cystitis without hematuria Take medication as prescribe Cotton underwear Take shower not bath Cranberry juice, yogurt Force fluids AZO over the counter X2 days Culture pending RTO prn  - sulfamethoxazole -trimethoprim  (BACTRIM  DS) 800-160 MG tablet; Take 1 tablet by mouth 2 (two) times daily.  Dispense: 20 tablet; Refill: 0    The above assessment and management plan was discussed with the patient. The patient verbalized understanding of and has agreed to the management plan. Patient is aware to call the clinic if symptoms persist or worsen. Patient is aware when to return to the clinic for a follow-up visit. Patient educated on when it is appropriate to go to the emergency department.   Mary-Margaret Gladis, FNP

## 2023-12-26 NOTE — Telephone Encounter (Signed)
  Chief Complaint: blood in urine Symptoms: blood in urine, mild pain with urination. Back pain Frequency: since yesterday Pertinent Negatives: Patient denies fever Disposition: [] ED /[] Urgent Care (no appt availability in office) / [x] Appointment(In office/virtual)/ []  [place] [facility]/ [] Home Care/ [] Refused Recommended Disposition /[] [place] [facility]/ []  Follow-up with PCP Additional Notes: Patient reports she is having symptoms of what she believes is a UTI. Patient reports visible blood in urine, mild pain with urination, and back pain starting yesterday. Patient scheduled for in office appt 1/6 per protocol. Patient advised to call back with worsening symptoms. Patient verbalized understanding.      Copied from CRM [id number]. Topic: Clinical - Red Word Triage >> Dec 26, 2023  8:46 AM Ivette P wrote: Red Word that prompted transfer to Nurse Triage: Blood in urine. Reason for Disposition  [1] Pain or burning with passing urine AND [2] side (flank) or back pain present  Answer Assessment - Initial Assessment Questions 1. COLOR of URINE: Describe the color of the urine.  (e.g., tea-colored, pink, red, bloody) Do you have blood clots in your urine? (e.g., none, pea, grape, small coin)     Bloody, milky color 2. ONSET: When did the bleeding start?   yesterday 3. EPISODES: How many times has there been blood in the urine? or How many times today?     2 times 4. PAIN with URINATION: Is there any pain with passing your urine? If Yes, ask: How bad is the pain?  (Scale 1-10; or mild, moderate, severe)    - MILD: Complains slightly about urination hurting.    - MODERATE: Interferes with normal activities.      - SEVERE: Excruciating, unwilling or unable to urinate because of the pain.      mild 5. FEVER: Do you have a fever? If Yes, ask: What is your temperature, how was it measured, and when did it start?     none 6. ASSOCIATED SYMPTOMS: Are you passing urine  more frequently than usual?     no 7. OTHER SYMPTOMS: Do you have any other symptoms? (e.g., back/flank pain, abdomen pain, vomiting)     Back pain  Protocols used: Urine - Blood In-A-AH

## 2023-12-29 LAB — URINE CULTURE

## 2024-01-02 ENCOUNTER — Ambulatory Visit: Payer: 59 | Admitting: Family

## 2024-01-02 ENCOUNTER — Telehealth: Payer: Self-pay | Admitting: Family

## 2024-01-02 NOTE — Telephone Encounter (Addendum)
  PT needs to be seen to be evaluated.   Copied from CRM [id number]. Topic: Referral - Request for Referral >> Jan 02, 2024  9:30 AM Benton KIDD wrote: Did the patient discuss referral with their provider in the last year? Yes (If No - schedule appointment) (If Yes - send message)  Appointment offered? No  Type of order/referral and detailed reason for visit: dermatologist.  . Have a little place that appeared on patient face and needs to get it checked out . One nurse say it look like its called mila but not for sure . They said it could heal up just got to get to right doctor . Patient say she is concerned cause she has a few more popping up.   Preference of office, provider, location: [place]  and can only do Monday and thursday  If referral order, have you been seen by this specialty before? No (If Yes, this issue or another issue? When? Where?  Can we respond through MyChart? No

## 2024-01-02 NOTE — Telephone Encounter (Signed)
 FYI//message already sent for referral to clinical  Copied from CRM [id number]. Topic: Appointments - Appointment Info/Confirmation >> Jan 02, 2024  9:20 AM Benton KIDD wrote: . Patient wanted to let Dr know that she had to reschedule appointment because she has another appointment today and they have to do updates. Got patient rescheduled to January 30th at 9:40 am.  Patient also wants a referral to a dermatologist . Have a little place that appeared on patient face and needs to get it checked out . One nurse say it look like its called mila but not for sure . They said it could heal up just got to get to right doctor . Patient say she is concerned cause she has a few more popping up.

## 2024-01-10 ENCOUNTER — Ambulatory Visit: Payer: Self-pay | Admitting: Family

## 2024-01-10 ENCOUNTER — Encounter: Payer: Self-pay | Admitting: Family Medicine

## 2024-01-10 ENCOUNTER — Ambulatory Visit (INDEPENDENT_AMBULATORY_CARE_PROVIDER_SITE_OTHER): Payer: 59 | Admitting: Family Medicine

## 2024-01-10 VITALS — BP 119/65 | HR 80 | Temp 99.2°F | Wt 151.0 lb

## 2024-01-10 DIAGNOSIS — H6591 Unspecified nonsuppurative otitis media, right ear: Secondary | ICD-10-CM | POA: Diagnosis not present

## 2024-01-10 DIAGNOSIS — L309 Dermatitis, unspecified: Secondary | ICD-10-CM

## 2024-01-10 MED ORDER — TRIAMCINOLONE ACETONIDE 0.1 % EX CREA: 1.0000 | TOPICAL_CREAM | Freq: Three times a day (TID) | CUTANEOUS | 0 refills | Status: AC

## 2024-01-10 MED ORDER — DOXYCYCLINE HYCLATE 100 MG PO CAPS: 100.0000 mg | ORAL_CAPSULE | Freq: Two times a day (BID) | ORAL | 0 refills | Status: DC

## 2024-01-10 MED ORDER — METHYLPREDNISOLONE 4 MG PO TBPK: ORAL_TABLET | ORAL | 0 refills | Status: DC

## 2024-01-10 NOTE — Progress Notes (Signed)
Chief Complaint  Patient presents with   Rash    Left arm red dots up her arm. Pt reports itching. Started a few days ago.    headcahe    Pressure in her head. Right ear pain and pressure.    HPI  Patient presents today for 3 days of pruritic rash on the right arm. No known exposure. Possibly bug bitess. Also feelin pressure in the right parietal scalp. Hearing seems to have been affected as well.   PMH: Smoking status noted ROS: Per HPI  Objective: BP 119/65   Pulse 80   Temp 99.2 F (37.3 C)   Wt 151 lb (68.5 kg)   LMP 10/16/2014   SpO2 98%   BMI 28.53 kg/m  Gen: NAD, alert, cooperative with exam HEENT: NCAT, EOMI, PERRL. TMS - left clear, fluid AD.  CV: RRR, good S1/S2, no murmur Resp: CTABL, no wheezes, non-labored Abd: SNTND, BS present, no guarding or organomegaly Ext: No edema, warm Neuro: Alert and oriented, No gross deficits  Assessment and plan:  1. Right otitis media with effusion   2. Eczema, unspecified type     Meds ordered this encounter  Medications   doxycycline (VIBRAMYCIN) 100 MG capsule    Sig: Take 1 capsule (100 mg total) by mouth 2 (two) times daily.    Dispense:  20 capsule    Refill:  0   methylPREDNISolone (MEDROL DOSEPAK) 4 MG TBPK tablet    Sig: Start with 6 on the first day and take one less each day until finished    Dispense:  21 tablet    Refill:  0   triamcinolone cream (KENALOG) 0.1 %    Sig: Apply 1 Application topically 3 (three) times daily. Avoid face and genitalia    Dispense:  45 g    Refill:  0    No orders of the defined types were placed in this encounter.   Follow up as needed.  Mechele Claude, MD

## 2024-01-10 NOTE — Telephone Encounter (Signed)
  Chief Complaint: rash Symptoms: rash, itching Frequency: began yesterday Pertinent Negatives: Patient denies SOB, fever, CP Disposition: [] ED /[] Urgent Care (no appt availability in office) / [x] Appointment(In office/virtual)/ []  [facility]/ [] Home Care/ [] Refused Recommended Disposition /[]  Mobile Bus/ []  Follow-up with PCP Additional Notes: Patient called reporting rash and itching on L arm between elbow and hand, states approx 13 spots pinpoint to eraser size. Red and raised per patient. States she has tried several OTC medications with no relief. Per protocol, patient to be evaluated within 4 hours, scheduled with first available provider at patient request.. Care advice reviewed with patient, understanding verbalized. Denies further questions at this time. Alerting PCP for review.    Copied from CRM #[id number]. Topic: Clinical - Red Word Triage >> Jan 10, 2024 10:18 AM Thomes Dinning wrote: Red Word that prompted transfer to Nurse Triage: Patient believes she may have had an allergic reaction with itching to her left arm. She has 10 spots that are itching/red and swollen. Reason for Disposition  [1] Looks infected (spreading redness, pus) AND [2] large red area (> 2 in. or 5 cm)  Answer Assessment - Initial Assessment Questions 1. DESCRIPTION: "Describe the itching you are having." "Where is it located?"     L arm, elbow down to hand- began yesterday 2. SEVERITY: "How bad is it?"    - MILD: Doesn't interfere with normal activities.   - MODERATE-SEVERE: Interferes with work, school, sleep, or other activities.      Severe- 8/10 3. SCRATCHING: "Are there any scratch marks? Bleeding?"     Patches of red marks- 13 spots, raised. 4. ONSET: "When did the itching begin?"      Yesterday- no new products, no new meds. 5. CAUSE: "What do you think is causing the itching?"      Could have been bitten, used benadryl cream and lotion, witch hazel 6. OTHER SYMPTOMS: "Do you have  any other symptoms?"      Denies  Answer Assessment - Initial Assessment Questions 1. APPEARANCE of RASH: "Describe the rash."      Red raised, smaller close to hand, larger by elbow. 2. LOCATION: "Where is the rash located?"      L arm, elbow to hand 3. NUMBER: "How many spots are there?"      13 4. SIZE: "How big are the spots?" (Inches, centimeters or compare to size of a coin)      Like the end of an ink pen, some like eraser. 5. ONSET: "When did the rash start?"      Yesterday 6. ITCHING: "Does the rash itch?" If Yes, ask: "How bad is the itch?"  (Scale 0-10; or none, mild, moderate, severe)     8/10 7. PAIN: "Does the rash hurt?" If Yes, ask: "How bad is the pain?"  (Scale 0-10; or none, mild, moderate, severe)    - NONE (0): no pain    - MILD (1-3): doesn't interfere with normal activities     - MODERATE (4-7): interferes with normal activities or awakens from sleep     - SEVERE (8-10): excruciating pain, unable to do any normal activities     Denies 8. OTHER SYMPTOMS: "Do you have any other symptoms?" (e.g., fever)     Denies  Protocols used: Itching - Localized-A-AH, Rash or Redness - Localized-A-AH

## 2024-01-14 ENCOUNTER — Other Ambulatory Visit: Payer: Self-pay | Admitting: Family

## 2024-01-14 DIAGNOSIS — Z79899 Other long term (current) drug therapy: Secondary | ICD-10-CM

## 2024-01-14 DIAGNOSIS — F411 Generalized anxiety disorder: Secondary | ICD-10-CM

## 2024-01-14 DIAGNOSIS — E663 Overweight: Secondary | ICD-10-CM

## 2024-01-16 ENCOUNTER — Other Ambulatory Visit: Payer: Self-pay | Admitting: Family

## 2024-01-16 DIAGNOSIS — Z79899 Other long term (current) drug therapy: Secondary | ICD-10-CM

## 2024-01-16 DIAGNOSIS — E663 Overweight: Secondary | ICD-10-CM

## 2024-01-16 DIAGNOSIS — F411 Generalized anxiety disorder: Secondary | ICD-10-CM

## 2024-01-18 ENCOUNTER — Ambulatory Visit: Payer: Self-pay | Admitting: Family

## 2024-01-18 DIAGNOSIS — R070 Pain in throat: Secondary | ICD-10-CM | POA: Diagnosis not present

## 2024-01-18 DIAGNOSIS — R0982 Postnasal drip: Secondary | ICD-10-CM | POA: Diagnosis not present

## 2024-01-18 DIAGNOSIS — B349 Viral infection, unspecified: Secondary | ICD-10-CM | POA: Diagnosis not present

## 2024-01-18 DIAGNOSIS — K120 Recurrent oral aphthae: Secondary | ICD-10-CM | POA: Diagnosis not present

## 2024-01-18 DIAGNOSIS — Z20822 Contact with and (suspected) exposure to covid-19: Secondary | ICD-10-CM | POA: Diagnosis not present

## 2024-01-18 DIAGNOSIS — J3489 Other specified disorders of nose and nasal sinuses: Secondary | ICD-10-CM | POA: Diagnosis not present

## 2024-01-18 NOTE — Telephone Encounter (Signed)
Copied from CRM [id number]. Topic: Clinical - Red Word Triage >> Jan 18, 2024 10:28 AM Dimitri Ped wrote: [facility] that prompted transfer to Nurse Triage: head cold have spots in your mouth needs to be checked it could be coming from a sinus infection. hurting all night long it want ease. Chest pain and having trouble breathing   Chief Complaint: Blister in mouth Symptoms: Painful blister in mouth, sinus pain/congestion, dry cough, scratchy throat, intermittent dizziness ( not currently dizzy) Pertinent Negatives: Patient denies fever Disposition: [] ED /[] Urgent Care (no appt availability in office) / [] Appointment(In office/virtual)/ []  [facility]/ [] Home Care/ [] Refused Recommended Disposition /[] [place] [facility]/ []  Follow-up with PCP Additional Notes: Patient is experiencing a painful blister in her mouth for a couple days. She also has sinus pain/congestion, dry cough, chest pain when coughing, scratchy throat. She  is taking Sudafed but feels like she needs to be prescribed something stronger. Attempted to schedule appointment tomorrow and patient declined because she prefers to be seen today. Patient will go to Urgent care today.   Reason for Disposition  [1] Sinus pain (not just congestion) AND [2] fever    No fever  Answer Assessment - Initial Assessment Questions 1. LOCATION: "Where is the ulcer located?"      Inside  2. NUMBER: "How many ulcers are there?"      1 big blister  3. SIZE: "How large is the ulcer?"      Dime size  4. SEVERITY: "Are they painful?" If Yes, ask: "How bad is it?"  (Scale 1-10; or mild, moderate, severe)  - MILD - eating  and drinking normally   - MODERATE - decreased liquid intake   - SEVERE - drinking very little      Hurts, 7/10  5. ONSET: "When did you first notice the ulcer?"      2 days ago  6. OTHER SYMPTOMS: "Do you have any other symptoms?" (e.g., fever)     No fever  Answer Assessment - Initial Assessment Questions 1.  LOCATION: "Where does it hurt?"      Around eyes, nose  2. ONSET: "When did the sinus pain start?"  (e.g., hours, days)      1/25  3. SEVERITY: "How bad is the pain?"   (Scale 1-10; mild, moderate or severe)   - MILD (1-3): doesn't interfere with normal activities    - MODERATE (4-7): interferes with normal activities (e.g., work or school) or awakens from sleep   - SEVERE (8-10): excruciating pain and patient unable to do any normal activities        5/10  4. NASAL CONGESTION: "Is the nose blocked?" If Yes, ask: "Can you open it or must you breathe through your mouth?"     Congestion, hard to breath through nose  5. FEVER: "Do you have a fever?" If Yes, ask: "What is it, how was it measured, and when did it start?"      No  6. OTHER SYMPTOMS: "Do you have any other symptoms?" (e.g., sore throat, cough, earache, difficulty breathing) Dry cough, stuffy  nose, dizziness when standing up fast, throat scratchy  Protocols used: Mouth Ulcers-A-AH, Sinus Pain or Congestion-A-AH

## 2024-01-19 ENCOUNTER — Ambulatory Visit: Payer: 59 | Admitting: Family Medicine

## 2024-01-19 ENCOUNTER — Ambulatory Visit: Payer: 59 | Admitting: Family

## 2024-01-19 ENCOUNTER — Encounter: Payer: Self-pay | Admitting: Family Medicine

## 2024-01-19 VITALS — BP 126/83 | HR 95 | Temp 98.3°F | Ht 61.0 in | Wt 150.0 lb

## 2024-01-19 DIAGNOSIS — R0981 Nasal congestion: Secondary | ICD-10-CM

## 2024-01-19 DIAGNOSIS — K1370 Unspecified lesions of oral mucosa: Secondary | ICD-10-CM

## 2024-01-19 DIAGNOSIS — Z5986 Financial insecurity: Secondary | ICD-10-CM | POA: Diagnosis not present

## 2024-01-19 DIAGNOSIS — R051 Acute cough: Secondary | ICD-10-CM

## 2024-01-19 MED ORDER — BENZONATATE 100 MG PO CAPS
100.0000 mg | ORAL_CAPSULE | Freq: Three times a day (TID) | ORAL | 0 refills | Status: DC | PRN
Start: 2024-01-19 — End: 2024-06-13

## 2024-01-19 MED ORDER — PROMETHAZINE-DM 6.25-15 MG/5ML PO SYRP
5.0000 mL | ORAL_SOLUTION | Freq: Four times a day (QID) | ORAL | 0 refills | Status: DC | PRN
Start: 2024-01-19 — End: 2024-06-13

## 2024-01-19 NOTE — Telephone Encounter (Signed)
Lmtcb to schedule an apt

## 2024-01-19 NOTE — Progress Notes (Signed)
Subjective:  Patient ID: Sandra Adams, female    DOB: 1966/02/17, 58 y.o.   MRN: [id number]  Patient Care Team: Junie Spencer, FNP as PCP - General (Family Medicine) Tilda Burrow, MD (Inactive) as Consulting Physician (Obstetrics and Gynecology) Isaiah Blakes, LCSW as [facility] Care Management (Licensed Clinical Social Worker)   Chief Complaint:  Sinus Problem (X2 weeks)   HPI: Sandra Adams is a 58 y.o. female presenting on 01/19/2024 for Sinus Problem (X2 weeks)  Symptoms started on 01/14/24. States that she has sinus pressure, rhinorrhea, cough. Sore throat only with cough. Coughing worse at night. Denies fever.  Went to urgent care yesterday and was provided magic mouthwash for canker sore Taking fluticasone nasal spray and pseudoephedrine.   Relevant past medical, surgical, family, and social history reviewed and updated as indicated.  Allergies and medications reviewed and updated. Data reviewed: Chart in Epic.   Past Medical History:  Diagnosis Date   Anxiety    Arthritis    Depression    Menopausal symptom    Migraine    Scoliosis     Past Surgical History:  Procedure Laterality Date   KNEE SURGERY Left 1974 and 1979    Social History   Socioeconomic History   Marital status: Divorced    Spouse name: Not on file   Number of children: 0   Years of education: 12th   Highest education level: High school graduate  Occupational History   Occupation: [employer]    Comment: Back : [employer] K[employer] surgery   Tobacco Use   Smoking status: Never   Smokeless tobacco: Never  Vaping Use   Vaping status: Never Used  Substance and Sexual Activity   Alcohol use: No   Drug use: No   Sexual activity: Not Currently    Partners: Male    Birth control/protection: None  Other Topics Concern   Not on file  Social History Narrative   Single, no children. Right handed. 12 th grade. No caffeine.   Divorced since 2004.   Social Drivers of  Corporate investment banker Strain: Low Risk  (04/05/2023)   Overall Financial Resource Strain (CARDIA)    Difficulty of Paying Living Expenses: Not hard at all  Food Insecurity: No Food Insecurity (04/05/2023)   Hunger Vital Sign    Worried About Running Out of Food in the Last Year: Never true    Ran Out of Food in the Last Year: Never true  Transportation Needs: No Transportation Needs (04/05/2023)   PRAPARE - Administrator, Civil Service (Medical): No    Lack of Transportation (Non-Medical): No  Physical Activity: Insufficiently Active (04/05/2023)   Exercise Vital Sign    Days of Exercise per Week: 3 days    Minutes of Exercise per Session: 30 min  Stress: No Stress Concern Present (04/05/2023)   [employer] of Occupational Health - Occupational Stress Questionnaire    Feeling of Stress : Not at all  Social Connections: Moderately Isolated (04/05/2023)   Social Connection and Isolation Panel [NHANES]    Frequency of Communication with Friends and Family: More than three times a week    Frequency of Social Gatherings with Friends and Family: More than three times a week    Attends Religious Services: More than 4 times per year    Active Member of [employer] or Organizations: No    Attends Banker Meetings: Never    Marital Status: Divorced  Intimate Partner Violence: Not At Risk (04/05/2023)   Humiliation, Afraid, Rape, and Kick questionnaire    Fear of Current or Ex-Partner: No    Emotionally Abused: No    Physically Abused: No    Sexually Abused: No    Outpatient Encounter Medications as of 01/19/2024  Medication Sig   ALPRAZolam (XANAX) 0.25 MG tablet Take 1 tablet (0.25 mg total) by mouth 2 (two) times daily as needed for anxiety.   baclofen (LIORESAL) 10 MG tablet Take 1 tablet (10 mg total) by mouth 3 (three) times daily.   diclofenac (VOLTAREN) 75 MG EC tablet Take 1 tablet (75 mg total) by mouth 2 (two) times daily.   doxycycline (VIBRAMYCIN) 100  MG capsule Take 1 capsule (100 mg total) by mouth 2 (two) times daily.   escitalopram (LEXAPRO) 10 MG tablet TAKE 1 TABLET BY MOUTH EVERY DAY   fluticasone (FLONASE) 50 MCG/ACT nasal spray SPRAY 2 SPRAYS INTO EACH NOSTRIL EVERY DAY   gabapentin (NEURONTIN) 100 MG capsule Take 1 capsule (100 mg total) by mouth 3 (three) times daily.   methylPREDNISolone (MEDROL DOSEPAK) 4 MG TBPK tablet Start with 6 on the first day and take one less each day until finished   omeprazole (PRILOSEC) 40 MG capsule TAKE 1 CAPSULE (40 MG TOTAL) BY MOUTH DAILY.   triamcinolone cream (KENALOG) 0.1 % Apply 1 Application topically 3 (three) times daily. Avoid face and genitalia   No facility-administered encounter medications on file as of 01/19/2024.    Allergies  Allergen Reactions   Ciprofloxacin     Makes her feel weak,dizzy   Imitrex [Sumatriptan]     Blurred vision    Penicillins    Prednisone Nausea And Vomiting   Tylenol [Acetaminophen]     Tylenol #3,Headaches   Vicodin [Hydrocodone-Acetaminophen]     GI upset   Clindamycin Palpitations    "It causes my heart to race "   Promethazine Nausea And Vomiting    Review of Systems As per HPI  Objective:  BP 126/83   Pulse 95   Temp 98.3 F (36.8 C)   Ht 5\' 1"  (1.549 m)   Wt 150 lb (68 kg)   LMP 10/16/2014   SpO2 95%   BMI 28.34 kg/m    Wt Readings from Last 3 Encounters:  01/19/24 150 lb (68 kg)  01/10/24 151 lb (68.5 kg)  12/26/23 152 lb (68.9 kg)   Physical Exam Constitutional:      General: She is awake. She is not in acute distress.    Appearance: Normal appearance. She is well-developed and well-groomed. She is ill-appearing. She is not toxic-appearing or diaphoretic.  HENT:     Right Ear: No laceration, drainage, swelling or tenderness. A middle ear effusion is present. There is no impacted cerumen. No foreign body. No mastoid tenderness. No PE tube. No hemotympanum. Tympanic membrane is not injected, scarred, perforated,  erythematous, retracted or bulging.     Left Ear: No laceration, drainage, swelling or tenderness. A middle ear effusion is present. There is no impacted cerumen. No foreign body. No mastoid tenderness. No PE tube. No hemotympanum. Tympanic membrane is not injected, scarred, perforated, erythematous, retracted or bulging.     Nose: Congestion and rhinorrhea present. Rhinorrhea is clear.     Right Sinus: Maxillary sinus tenderness and frontal sinus tenderness present.     Left Sinus: Maxillary sinus tenderness and frontal sinus tenderness present.     Mouth/Throat:     Lips: Pink. No lesions.  Mouth: Oral lesions present.     Tongue: No lesions.     Palate: No mass.     Pharynx: Posterior oropharyngeal erythema present. No oropharyngeal exudate, uvula swelling or postnasal drip.     Tonsils: No tonsillar exudate or tonsillar abscesses.     Comments: Small, circular ~3 mm lesion on lower lip Cardiovascular:     Rate and Rhythm: Normal rate and regular rhythm.     Pulses: Normal pulses.          Radial pulses are 2+ on the right side and 2+ on the left side.       Posterior tibial pulses are 2+ on the right side and 2+ on the left side.     Heart sounds: Normal heart sounds. No murmur heard.    No gallop.  Pulmonary:     Effort: Pulmonary effort is normal. No respiratory distress.     Breath sounds: Normal breath sounds. No stridor. No wheezing, rhonchi or rales.  Musculoskeletal:     Cervical back: Full passive range of motion without pain and neck supple.     Right lower leg: No edema.     Left lower leg: No edema.  Lymphadenopathy:     Head:     Right side of head: No submental, submandibular, tonsillar, preauricular or posterior auricular adenopathy.     Left side of head: No submental, submandibular, tonsillar, preauricular or posterior auricular adenopathy.     Cervical:     Right cervical: No superficial cervical adenopathy.    Left cervical: No superficial cervical  adenopathy.  Skin:    General: Skin is warm.     Capillary Refill: Capillary refill takes less than 2 seconds.  Neurological:     General: No focal deficit present.     Mental Status: She is alert, oriented to person, place, and time and easily aroused. Mental status is at baseline.     GCS: GCS eye subscore is 4. GCS verbal subscore is 5. GCS motor subscore is 6.     Motor: No weakness.  Psychiatric:        Attention and Perception: Attention and perception normal.        Mood and Affect: Mood and affect normal.        Speech: Speech normal.        Behavior: Behavior normal. Behavior is cooperative.        Thought Content: Thought content normal. Thought content does not include homicidal or suicidal ideation. Thought content does not include homicidal or suicidal plan.        Cognition and Memory: Cognition normal.        Judgment: Judgment normal.     Results for orders placed or performed in visit on 12/26/23  Urine Culture   Collection Time: 12/26/23 12:09 PM   Specimen: Urine   UR  Result Value Ref Range   Urine Culture, Routine Final report (A)    Organism ID, Bacteria Klebsiella pneumoniae (A)    Antimicrobial Susceptibility Comment   Microscopic Examination   Collection Time: 12/26/23 12:09 PM   Urine  Result Value Ref Range   WBC, UA 11-30 (A) 0 - 5 /hpf   RBC, Urine 0-2 0 - 2 /hpf   Epithelial Cells (non renal) 0-10 0 - 10 /hpf   Renal Epithel, UA None seen None seen /hpf   Bacteria, UA Few (A) None seen/Few   Yeast, UA None seen None seen  Urinalysis, Complete   Collection  Time: 12/26/23 12:09 PM  Result Value Ref Range   Specific Gravity, UA 1.025 1.005 - 1.030   pH, UA 5.5 5.0 - 7.5   Color, UA Yellow Yellow   Appearance Ur Clear Clear   Leukocytes,UA 1+ (A) Negative   Protein,UA Negative Negative/Trace   Glucose, UA Negative Negative   Ketones, UA Negative Negative   RBC, UA Trace (A) Negative   Bilirubin, UA Negative Negative   Urobilinogen, Ur 0.2  0.2 - 1.0 mg/dL   Nitrite, UA Negative Negative   Microscopic Examination See below:        01/19/2024    2:05 PM 01/10/2024   11:54 AM 12/08/2023   11:03 AM 09/30/2023    2:34 PM 09/28/2023    3:55 PM  Depression screen PHQ 2/9  Decreased Interest 0 0 0 0 0  Down, Depressed, Hopeless 1 0 0 1 0  PHQ - 2 Score 1 0 0 1 0  Altered sleeping 1 0 1 1 1   Tired, decreased energy 0 0 1 0 0  Change in appetite 1 0 0 0 1  Feeling bad or failure about yourself  0 1 0 0 0  Trouble concentrating 0 0 0 0 0  Moving slowly or fidgety/restless 0 0 0 0 0  Suicidal thoughts  0 0 0 0  PHQ-9 Score 3 1 2 2 2   Difficult doing work/chores Not difficult at all   Not difficult at all Not difficult at all       01/19/2024    2:06 PM 01/10/2024   11:55 AM 12/08/2023   11:04 AM 09/30/2023    2:35 PM  GAD 7 : Generalized Anxiety Score  Nervous, Anxious, on Edge 1 1 1  0  Control/stop worrying 0 0 0 0  Worry too much - different things 0 0 0 0  Trouble relaxing 0 0 0 0  Restless 0 0 1 0  Easily annoyed or irritable 0 0 0 0  Afraid - awful might happen 0 0 0 0  Total GAD 7 Score 1 1 2  0  Anxiety Difficulty Not difficult at all Somewhat difficult  Not difficult at all   Pertinent labs & imaging results that were available during my care of the patient were reviewed by me and considered in my medical decision making.  Assessment & Plan:  Briasia was seen today for sinus problem.  Diagnoses and all orders for this visit:  Acute cough Will start medication as below for patient. Discussed that likely viral. She declined mucinex at this time. Discussed at home care with OTC supportive treatments, fluids, humidifier, lozenges, and teas.  -     benzonatate (TESSALON PERLES) 100 MG capsule; Take 1 capsule (100 mg total) by mouth 3 (three) times daily as needed. -     promethazine-dextromethorphan (PROMETHAZINE-DM) 6.25-15 MG/5ML syrup; Take 5 mLs by mouth 4 (four) times daily as needed for cough.  Sinus  congestion As above -     benzonatate (TESSALON PERLES) 100 MG capsule; Take 1 capsule (100 mg total) by mouth 3 (three) times daily as needed. -     promethazine-dextromethorphan (PROMETHAZINE-DM) 6.25-15 MG/5ML syrup; Take 5 mLs by mouth 4 (four) times daily as needed for cough.  Oral lesion Patient to pick up magic mouthwash as prescribed by urgent care. Can also try OTC orajel and biotene.   Financial insecurity Patient expressed that she may not be able to pick up medications due to cost. Encouraged her to reach out if  she needs additional support.     Continue all other maintenance medications.  Follow up plan: Return if symptoms worsen or fail to improve.   Continue healthy lifestyle choices, including diet (rich in fruits, vegetables, and lean proteins, and low in salt and simple carbohydrates) and exercise (at least 30 minutes of moderate physical activity daily).  Written and verbal instructions provided   The above assessment and management plan was discussed with the patient. The patient verbalized understanding of and has agreed to the management plan. Patient is aware to call the clinic if they develop any new symptoms or if symptoms persist or worsen. Patient is aware when to return to the clinic for a follow-up visit. Patient educated on when it is appropriate to go to the emergency department.   Neale Burly, DNP-FNP [facility] [address] [place], [place] [address] [phone]

## 2024-01-19 NOTE — Patient Instructions (Addendum)
Orajel Mouthwash  Biotene Mouthwash  It appears that you have a viral upper respiratory infection (cold).  Cold symptoms can last up to 2 weeks.    - Get plenty of rest and drink plenty of fluids. - Try to breathe moist air. Use a cold mist humidifier. - Consume warm fluids (soup or tea) to provide relief for a stuffy nose and to loosen phlegm. - For nasal stuffiness, try saline nasal spray or a Neti Pot. Afrin nasal spray can also be used but this product should not be used longer than 3 days or it will cause rebound nasal stuffiness (worsening nasal congestion). - For sore throat pain relief: use chloraseptic spray, suck on throat lozenges, hard candy or popsicles; gargle with warm salt water (1/4 tsp. salt per 8 oz. of water); and eat soft, bland foods. - Eat a well-balanced diet. If you cannot, ensure you are getting enough nutrients by taking a daily multivitamin. - Avoid dairy products, as they can thicken phlegm. - Avoid alcohol, as it impairs your body's immune system.  CONTACT YOUR DOCTOR IF YOU EXPERIENCE ANY OF THE FOLLOWING: - High fever - Ear pain - Sinus-type headache - Unusually severe cold symptoms - Cough that gets worse while other cold symptoms improve - Flare up of any chronic lung problem, such as asthma - Your symptoms persist longer than 2 weeks

## 2024-01-25 ENCOUNTER — Ambulatory Visit: Payer: Self-pay | Admitting: Family

## 2024-01-25 NOTE — Telephone Encounter (Signed)
 Chief Complaint: Dry cough Symptoms: Shivering, runny nose Frequency: Intermittent Pertinent Negatives: Patient denies wheezing, CP Disposition: [] ED /[] Urgent Care (no appt availability in office) / [x] Appointment(In office/virtual)/ []  [facility]/ [] Home Care/ [] Refused Recommended Disposition /[] [facility]/ []  Follow-up with PCP Additional Notes: Pt went to urgent care (Mon), blood in right nostril noted at urgent care, no abx given. Pt using cough drops. This RN educated pt on home care, new-worsening symptoms, when to call back/seek emergent care. Pt verbalized understanding and agrees to plan.   Reason for Disposition  SEVERE coughing spells (e.g., whooping sound after coughing, vomiting after coughing)  Answer Assessment - Initial Assessment Questions 1. ONSET: When did the cough begin?      Yesterday evening off and on 4. HEMOPTYSIS: Are you coughing up any blood? If so ask: How much? (flecks, streaks, tablespoons, etc.)     No 5. DIFFICULTY BREATHING: Are you having difficulty breathing? If Yes, ask: How bad is it? (e.g., mild, moderate, severe)    - MILD: No SOB at rest, mild SOB with walking, speaks normally in sentences, can lie down, no retractions, pulse < 100.    - MODERATE: SOB at rest, SOB with minimal exertion and prefers to sit, cannot lie down flat, speaks in phrases, mild retractions, audible wheezing, pulse 100-120.    - SEVERE: Very SOB at rest, speaks in single words, struggling to breathe, sitting hunched forward, retractions, pulse > 120      No 6. FEVER: Do you have a fever? If Yes, ask: What is your temperature, how was it measured, and when did it start?     Not sure, thermometer does not have a battery 9. PE RISK FACTORS: Do you have a history of blood clots? (or: recent major surgery, recent prolonged travel, bedridden)     No 10. OTHER SYMPTOMS: Do you have any other symptoms? (e.g., runny nose, wheezing, chest  pain)       Runny nose  Protocols used: Cough - Acute Non-Productive-A-AH

## 2024-01-26 ENCOUNTER — Encounter: Payer: Self-pay | Admitting: Family

## 2024-01-26 ENCOUNTER — Ambulatory Visit: Payer: 59 | Admitting: Nurse Practitioner

## 2024-01-31 ENCOUNTER — Telehealth: Payer: 59 | Admitting: Family

## 2024-01-31 DIAGNOSIS — L989 Disorder of the skin and subcutaneous tissue, unspecified: Secondary | ICD-10-CM | POA: Diagnosis not present

## 2024-01-31 DIAGNOSIS — R059 Cough, unspecified: Secondary | ICD-10-CM | POA: Diagnosis not present

## 2024-01-31 DIAGNOSIS — J329 Chronic sinusitis, unspecified: Secondary | ICD-10-CM | POA: Diagnosis not present

## 2024-02-01 ENCOUNTER — Other Ambulatory Visit: Payer: Self-pay | Admitting: Family

## 2024-02-01 DIAGNOSIS — F411 Generalized anxiety disorder: Secondary | ICD-10-CM

## 2024-02-01 DIAGNOSIS — E663 Overweight: Secondary | ICD-10-CM

## 2024-02-01 DIAGNOSIS — Z79899 Other long term (current) drug therapy: Secondary | ICD-10-CM

## 2024-02-03 ENCOUNTER — Telehealth: Payer: 59 | Admitting: Family

## 2024-02-08 ENCOUNTER — Institutional Professional Consult (permissible substitution): Payer: 59 | Admitting: Internal Medicine

## 2024-02-13 ENCOUNTER — Encounter: Payer: Self-pay | Admitting: Nurse Practitioner

## 2024-02-13 ENCOUNTER — Ambulatory Visit: Payer: 59 | Admitting: Nurse Practitioner

## 2024-02-13 ENCOUNTER — Ambulatory Visit: Payer: Self-pay | Admitting: Family

## 2024-02-13 VITALS — BP 125/82 | HR 98 | Temp 97.7°F | Ht 61.0 in | Wt 149.0 lb

## 2024-02-13 DIAGNOSIS — H6992 Unspecified Eustachian tube disorder, left ear: Secondary | ICD-10-CM

## 2024-02-13 DIAGNOSIS — B3731 Acute candidiasis of vulva and vagina: Secondary | ICD-10-CM | POA: Diagnosis not present

## 2024-02-13 DIAGNOSIS — Z79899 Other long term (current) drug therapy: Secondary | ICD-10-CM

## 2024-02-13 DIAGNOSIS — E663 Overweight: Secondary | ICD-10-CM

## 2024-02-13 DIAGNOSIS — F411 Generalized anxiety disorder: Secondary | ICD-10-CM

## 2024-02-13 DIAGNOSIS — N898 Other specified noninflammatory disorders of vagina: Secondary | ICD-10-CM | POA: Diagnosis not present

## 2024-02-13 LAB — URINALYSIS, ROUTINE W REFLEX MICROSCOPIC
Bilirubin, UA: NEGATIVE
Glucose, UA: NEGATIVE
Ketones, UA: NEGATIVE
Nitrite, UA: NEGATIVE
Protein,UA: NEGATIVE
Specific Gravity, UA: >1.03 — ABNORMAL HIGH (ref 1.005–1.030)
Urobilinogen, Ur: 0.2 mg/dL (ref 0.2–1.0)
pH, UA: 5.5 (ref 5.0–7.5)

## 2024-02-13 LAB — WET PREP FOR TRICH, YEAST, CLUE
Clue Cell Exam: NEGATIVE
Trichomonas Exam: NEGATIVE
Yeast Exam: POSITIVE — AB

## 2024-02-13 LAB — MICROSCOPIC EXAMINATION: Renal Epithel, UA: NONE SEEN /[HPF]

## 2024-02-13 MED ORDER — FLUCONAZOLE 150 MG PO TABS
150.0000 mg | ORAL_TABLET | Freq: Every day | ORAL | 1 refills | Status: DC
Start: 2024-02-13 — End: 2024-06-13

## 2024-02-13 MED ORDER — FLUTICASONE PROPIONATE 50 MCG/ACT NA SUSP
2.0000 | Freq: Every day | NASAL | 2 refills | Status: DC
Start: 2024-02-13 — End: 2024-09-13

## 2024-02-13 NOTE — Telephone Encounter (Signed)
 Chief Complaint: Urinary Symptoms Symptoms: abdominal pain, mild back pain, scant blood in urine, foul odor, increased frequency, positive test kit Frequency: since Friday Pertinent Negatives: Patient denies fever Disposition: [] ED /[] Urgent Care (no appt availability in office) / [x] Appointment(In office/virtual)/ []  [facility]/ [] Home Care/ [] Refused Recommended Disposition /[] [facility]/ []  Follow-up with PCP Additional Notes: Patient called in stating she believes she has a UTI due to urinary symptoms of increased frequency, foul odor, abdominal pain, back pain, mild burning, and a positive UTI home kit. Patient states she recently finished Amoxicillin 875 mg last Tuesday for a sinus infection. Patient states she is having mild vaginal itching as well, and is unsure if the Amoxicillin caused a yeast infection. Patient states she is taking a Cystex Bacteria control for symptoms today until she is evaluated by provider. Patient denies fever. Advised patient to increase fluids as well. While on phone, patient mentioned pharmacy attempted to call provider about refills for Xanax, Flonase, and Ibuprofen but office has not returned pharmacy call .   Copied from CRM [id number]. Topic: Clinical - Red Word Triage >> Feb 13, 2024  9:32 AM Elle L wrote: Red Word that prompted transfer to Nurse Triage: The patient has been having severe pain in her abdomen and believes her UTI has come back as she has blood in urine. Reason for Disposition  Urinating more frequently than usual (i.e., frequency)  Answer Assessment - Initial Assessment Questions 1. SYMPTOM: "What's the main symptom you're concerned about?" (e.g., frequency, incontinence)    Increased frequency 2. ONSET: "When did the  symptoms  start?"     Friday 3. PAIN: "Is there any pain?" If Yes, ask: "How bad is it?" (Scale: 1-10; mild, moderate, severe)     Mild burning 4. CAUSE: "What do you think is causing the symptoms?"      UTI - patient took a home test on Saturday  5. OTHER SYMPTOMS: "Do you have any other symptoms?" (e.g., blood in urine, fever, flank pain, pain with urination)     Abdominal pain, scant of blood in urine, foul odor, mild burning  Protocols used: Urinary Symptoms-A-AH

## 2024-02-13 NOTE — Progress Notes (Unsigned)
   Acute Office Visit  Subjective:     Patient ID: Sandra Adams, female    DOB: 1966/12/16, 58 y.o.   MRN: [id number]  Chief Complaint  Patient presents with   Vaginal Itching    HPI SUBJECTIVE: Sandra Adams is a 58 y.o. female who complains of urinary frequency, urgency and dysuria x *** days, without flank pain, fever, chills, or abnormal vaginal discharge or bleeding.   Was treate dat the urent fro sinusitis with amxocillin    ROS Negative unless indicated in HPI    Objective:    LMP 10/16/2014  {Vitals History (Optional):23777}  Physical Exam OBJECTIVE: Appears well, in no apparent distress.  Vital signs are normal. The abdomen is soft without tenderness, guarding, mass, rebound or organomegaly. No CVA tenderness or inguinal adenopathy noted. Urine dipstick shows {ua dip:315113}.  Micro exam: {urine micro:315114}.  No results found for any visits on 02/13/24.      Assessment & Plan:  Vaginal itching -     Urinalysis, Routine w reflex microscopic -     WET PREP FOR TRICH, YEAST, CLUE    ASSESSMENT: UTI uncomplicated without evidence of pyelonephritis  PLAN: Treatment per orders - also push fluids, may use Pyridium OTC prn. Call or return to clinic prn if these symptoms worsen or fail to improve as anticipated.   Encourage healthy lifestyle choices, including diet (rich in fruits, vegetables, and lean proteins, and low in salt and simple carbohydrates) and exercise (at least 30 minutes of moderate physical activity daily).     The above assessment and management plan was discussed with the patient. The patient verbalized understanding of and has agreed to the management plan. Patient is aware to call the clinic if they develop any new symptoms or if symptoms persist or worsen. Patient is aware when to return to the clinic for a follow-up visit. Patient educated on when it is appropriate to go to the emergency department.  Return if symptoms worsen or fail to  improve.  Arrie Aran Santa Lighter, [facility] [address] [place], [place] [address] [phone]  Note: This document was prepared by [employer] voice dictation technology and any errors that results from this process are unintentional.

## 2024-02-20 ENCOUNTER — Encounter: Payer: Self-pay | Admitting: Family Medicine

## 2024-02-20 ENCOUNTER — Ambulatory Visit (INDEPENDENT_AMBULATORY_CARE_PROVIDER_SITE_OTHER): Admitting: Family Medicine

## 2024-02-20 VITALS — BP 124/75 | HR 91 | Temp 97.6°F | Ht 61.0 in | Wt 150.0 lb

## 2024-02-20 DIAGNOSIS — R32 Unspecified urinary incontinence: Secondary | ICD-10-CM

## 2024-02-20 DIAGNOSIS — B3731 Acute candidiasis of vulva and vagina: Secondary | ICD-10-CM | POA: Diagnosis not present

## 2024-02-20 DIAGNOSIS — N3090 Cystitis, unspecified without hematuria: Secondary | ICD-10-CM

## 2024-02-20 LAB — URINALYSIS, ROUTINE W REFLEX MICROSCOPIC
Bilirubin, UA: NEGATIVE
Glucose, UA: NEGATIVE
Ketones, UA: NEGATIVE
Nitrite, UA: POSITIVE — AB
Specific Gravity, UA: 1.025 (ref 1.005–1.030)
Urobilinogen, Ur: 0.2 mg/dL (ref 0.2–1.0)
pH, UA: 6 (ref 5.0–7.5)

## 2024-02-20 LAB — MICROSCOPIC EXAMINATION
Epithelial Cells (non renal): NONE SEEN /HPF (ref 0–10)
Renal Epithel, UA: NONE SEEN /HPF
WBC, UA: >30 /HPF — AB (ref 0–5)
Yeast, UA: NONE SEEN

## 2024-02-20 MED ORDER — SULFAMETHOXAZOLE-TRIMETHOPRIM 800-160 MG PO TABS
1.0000 | ORAL_TABLET | Freq: Two times a day (BID) | ORAL | 0 refills | Status: DC
Start: 2024-02-20 — End: 2024-07-03

## 2024-02-20 NOTE — Progress Notes (Signed)
 Subjective:  Patient ID: Sandra Adams, female    DOB: June 18, 1966  Age: 58 y.o. MRN: [id number]  CC: Vaginitis (Treated for yeats infection by sandra./Now having odor from urine and it's cloudy. No pain. Having incontinence. UTI strips from home showed UTI. )   HPI Sandra Adams presents for burning with urination and frequency for several days. Having urgency. Denies fever . No flank pain. No nausea, vomiting.      02/20/2024    2:14 PM 01/19/2024    2:05 PM 01/10/2024   11:54 AM  Depression screen PHQ 2/9  Decreased Interest 0 0 0  Down, Depressed, Hopeless 0 1 0  PHQ - 2 Score 0 1 0  Altered sleeping  1 0  Tired, decreased energy  0 0  Change in appetite  1 0  Feeling bad or failure about yourself   0 1  Trouble concentrating  0 0  Moving slowly or fidgety/restless  0 0  Suicidal thoughts   0  PHQ-9 Score  3 1  Difficult doing work/chores Not difficult at all Not difficult at all     History Sandra Adams has a past medical history of Anxiety, Arthritis, Depression, Menopausal symptom, Migraine, and Scoliosis.   Sandra Adams has a past surgical history that includes Knee surgery (Left, 1974 and 1979).   Her family history includes Anxiety disorder in her sister; COPD in her father, maternal aunt, and sister; Cancer in her father; Diabetes in her maternal aunt and maternal uncle; Epilepsy in her mother; Heart disease in her father; Hyperlipidemia in her father.Sandra Adams reports that Sandra Adams has never smoked. Sandra Adams has never used smokeless tobacco. Sandra Adams reports that Sandra Adams does not drink alcohol and does not use drugs.    ROS Review of Systems  Constitutional:  Negative for chills, diaphoresis and fever.  HENT:  Negative for congestion.   Eyes:  Negative for visual disturbance.  Respiratory:  Negative for cough and shortness of breath.   Cardiovascular:  Negative for chest pain and palpitations.  Gastrointestinal:  Negative for constipation, diarrhea and nausea.  Genitourinary:  Positive for  dysuria, frequency and urgency. Negative for decreased urine volume, flank pain, hematuria, menstrual problem and pelvic pain.  Musculoskeletal:  Negative for arthralgias and joint swelling.  Skin:  Negative for rash.  Neurological:  Negative for dizziness and numbness.    Objective:  Pulse 91   Temp 97.6 F (36.4 C)   Ht 5\' 1"  (1.549 m)   Wt 150 lb (68 kg)   LMP 10/16/2014   SpO2 95%   BMI 28.34 kg/m   BP Readings from Last 3 Encounters:  02/13/24 125/82  01/19/24 126/83  01/10/24 119/65    Wt Readings from Last 3 Encounters:  02/20/24 150 lb (68 kg)  02/13/24 149 lb (67.6 kg)  01/19/24 150 lb (68 kg)     Physical Exam Constitutional:      Appearance: Sandra Adams is well-developed.  HENT:     Head: Normocephalic and atraumatic.  Cardiovascular:     Rate and Rhythm: Normal rate and regular rhythm.     Heart sounds: No murmur heard. Pulmonary:     Effort: Pulmonary effort is normal.     Breath sounds: Normal breath sounds.  Abdominal:     General: Bowel sounds are normal.     Palpations: Abdomen is soft. There is no mass.     Tenderness: There is no abdominal tenderness. There is no guarding or rebound.  Musculoskeletal:  General: No tenderness.  Skin:    General: Skin is warm and dry.  Neurological:     Mental Status: Sandra Adams is alert and oriented to person, place, and time.  Psychiatric:        Behavior: Behavior normal.       Assessment & Plan:   Sandra Adams was seen today for vaginitis.  Diagnoses and all orders for this visit:  Cystitis -     Urinalysis, Routine w reflex microscopic -     Urine Culture -     sulfamethoxazole-trimethoprim (BACTRIM DS) 800-160 MG tablet; Take 1 tablet by mouth 2 (two) times daily.  Vaginal yeast infection -     Urinalysis, Routine w reflex microscopic  Incontinence in female -     Urinalysis, Routine w reflex microscopic       I have discontinued Sandra Adams's doxycycline. I am also having her start on  sulfamethoxazole-trimethoprim. Additionally, I am having her maintain her gabapentin, ALPRAZolam, baclofen, diclofenac, escitalopram, omeprazole, methylPREDNISolone, triamcinolone cream, benzonatate, promethazine-dextromethorphan, fluticasone, and fluconazole.  Allergies as of 02/20/2024       Reactions   Ciprofloxacin    Makes her feel weak,dizzy   Imitrex [sumatriptan]    Blurred vision   Penicillins    Prednisone Nausea And Vomiting   Tylenol [acetaminophen]    Tylenol #3,Headaches   Vicodin [hydrocodone-acetaminophen]    GI upset   Clindamycin Palpitations   "It causes my heart to race "   Promethazine Nausea And Vomiting        Medication List        Accurate as of February 20, 2024  3:27 PM. If you have any questions, ask your nurse or doctor.          STOP taking these medications    doxycycline 100 MG capsule Commonly known as: Vibramycin Stopped by: Sie Formisano       TAKE these medications    ALPRAZolam 0.25 MG tablet Commonly known as: XANAX Take 1 tablet (0.25 mg total) by mouth 2 (two) times daily as needed for anxiety.   baclofen 10 MG tablet Commonly known as: LIORESAL Take 1 tablet (10 mg total) by mouth 3 (three) times daily.   benzonatate 100 MG capsule Commonly known as: Tessalon Perles Take 1 capsule (100 mg total) by mouth 3 (three) times daily as needed.   diclofenac 75 MG EC tablet Commonly known as: VOLTAREN Take 1 tablet (75 mg total) by mouth 2 (two) times daily.   escitalopram 10 MG tablet Commonly known as: LEXAPRO TAKE 1 TABLET BY MOUTH EVERY DAY   fluconazole 150 MG tablet Commonly known as: Diflucan Take 1 tablet (150 mg total) by mouth daily.   fluticasone 50 MCG/ACT nasal spray Commonly known as: FLONASE Place 2 sprays into both nostrils daily.   gabapentin 100 MG capsule Commonly known as: NEURONTIN Take 1 capsule (100 mg total) by mouth 3 (three) times daily.   methylPREDNISolone 4 MG Tbpk tablet Commonly known  as: MEDROL DOSEPAK Start with 6 on the first day and take one less each day until finished   omeprazole 40 MG capsule Commonly known as: PRILOSEC TAKE 1 CAPSULE (40 MG TOTAL) BY MOUTH DAILY.   promethazine-dextromethorphan 6.25-15 MG/5ML syrup Commonly known as: PROMETHAZINE-DM Take 5 mLs by mouth 4 (four) times daily as needed for cough.   sulfamethoxazole-trimethoprim 800-160 MG tablet Commonly known as: BACTRIM DS Take 1 tablet by mouth 2 (two) times daily. Started by: Broadus John Mayola Mcbain   triamcinolone cream  0.1 % Commonly known as: KENALOG Apply 1 Application topically 3 (three) times daily. Avoid face and genitalia         Follow-up: Return if symptoms worsen or fail to improve.  Mechele Claude, M.D.

## 2024-02-21 ENCOUNTER — Ambulatory Visit: Payer: 59 | Admitting: Family

## 2024-02-22 LAB — URINE CULTURE

## 2024-02-23 ENCOUNTER — Institutional Professional Consult (permissible substitution): Payer: 59 | Admitting: Internal Medicine

## 2024-02-24 ENCOUNTER — Other Ambulatory Visit: Payer: Self-pay | Admitting: Family

## 2024-02-24 DIAGNOSIS — E663 Overweight: Secondary | ICD-10-CM

## 2024-02-24 DIAGNOSIS — N390 Urinary tract infection, site not specified: Secondary | ICD-10-CM | POA: Diagnosis not present

## 2024-02-24 DIAGNOSIS — F411 Generalized anxiety disorder: Secondary | ICD-10-CM

## 2024-02-24 DIAGNOSIS — R350 Frequency of micturition: Secondary | ICD-10-CM | POA: Diagnosis not present

## 2024-02-24 DIAGNOSIS — F331 Major depressive disorder, recurrent, moderate: Secondary | ICD-10-CM

## 2024-02-24 DIAGNOSIS — Z79899 Other long term (current) drug therapy: Secondary | ICD-10-CM

## 2024-03-09 ENCOUNTER — Ambulatory Visit: Admitting: Family

## 2024-03-13 ENCOUNTER — Encounter: Payer: Self-pay | Admitting: Family

## 2024-03-14 ENCOUNTER — Ambulatory Visit: Payer: 59 | Admitting: Urology

## 2024-03-18 ENCOUNTER — Other Ambulatory Visit: Payer: Self-pay | Admitting: Family

## 2024-03-18 DIAGNOSIS — Z79899 Other long term (current) drug therapy: Secondary | ICD-10-CM

## 2024-03-18 DIAGNOSIS — E663 Overweight: Secondary | ICD-10-CM

## 2024-03-18 DIAGNOSIS — F411 Generalized anxiety disorder: Secondary | ICD-10-CM

## 2024-03-26 ENCOUNTER — Ambulatory Visit (INDEPENDENT_AMBULATORY_CARE_PROVIDER_SITE_OTHER): Admitting: Nurse Practitioner

## 2024-03-26 ENCOUNTER — Other Ambulatory Visit: Payer: Self-pay | Admitting: Family

## 2024-03-26 ENCOUNTER — Encounter: Payer: Self-pay | Admitting: Nurse Practitioner

## 2024-03-26 VITALS — BP 117/81 | HR 86 | Temp 97.2°F | Ht 61.0 in | Wt 148.0 lb

## 2024-03-26 DIAGNOSIS — M545 Low back pain, unspecified: Secondary | ICD-10-CM | POA: Diagnosis not present

## 2024-03-26 DIAGNOSIS — Z79899 Other long term (current) drug therapy: Secondary | ICD-10-CM

## 2024-03-26 DIAGNOSIS — R300 Dysuria: Secondary | ICD-10-CM

## 2024-03-26 DIAGNOSIS — E663 Overweight: Secondary | ICD-10-CM

## 2024-03-26 DIAGNOSIS — F411 Generalized anxiety disorder: Secondary | ICD-10-CM

## 2024-03-26 LAB — URINALYSIS, ROUTINE W REFLEX MICROSCOPIC
Bilirubin, UA: NEGATIVE
Glucose, UA: NEGATIVE
Ketones, UA: NEGATIVE
Leukocytes,UA: NEGATIVE
Nitrite, UA: NEGATIVE
Protein,UA: NEGATIVE
Specific Gravity, UA: 1.025 (ref 1.005–1.030)
Urobilinogen, Ur: 0.2 mg/dL (ref 0.2–1.0)
pH, UA: 5 (ref 5.0–7.5)

## 2024-03-26 LAB — MICROSCOPIC EXAMINATION
Bacteria, UA: NONE SEEN
Renal Epithel, UA: NONE SEEN /HPF
Yeast, UA: NONE SEEN

## 2024-03-26 MED ORDER — IBUPROFEN 800 MG PO TABS: 800.0000 mg | ORAL_TABLET | Freq: Three times a day (TID) | ORAL | 0 refills | Status: DC | PRN

## 2024-03-26 NOTE — Progress Notes (Signed)
   Subjective:    Patient ID: Sandra Adams, female    DOB: 03/17/1966, 58 y.o.   MRN: [id number]   Chief Complaint: dysuria  Dysuria  This is a new problem. The current episode started in the past 7 days. The problem has been waxing and waning. The quality of the pain is described as burning. The pain is at a severity of 4/10. The pain is mild. There has been no fever. She is Sexually active. There is No history of pyelonephritis. Associated symptoms include flank pain and urgency. Pertinent negatives include no chills, discharge, frequency or hesitancy. She has tried nothing for the symptoms. The treatment provided mild relief.    Patient Active Problem List   Diagnosis Date Noted   Osteoarthritis 11/06/2019   Osteopenia 07/21/2017   Chronic back pain 12/31/2016   Controlled substance agreement signed 12/31/2016   Overweight (BMI 25.0-29.9) 07/02/2016   GERD (gastroesophageal reflux disease) 10/01/2014   GAD (generalized anxiety disorder) 10/01/2014   Depression 10/01/2014   Menopausal hot flushes 09/16/2014   Dysmenorrhea 05/20/2014   Migraine without aura 03/27/2014       Review of Systems  Constitutional:  Negative for chills.  Genitourinary:  Positive for dysuria, flank pain and urgency. Negative for frequency and hesitancy.       Objective:   Physical Exam Cardiovascular:     Rate and Rhythm: Normal rate and regular rhythm.     Heart sounds: Normal heart sounds.  Pulmonary:     Effort: Pulmonary effort is normal.     Breath sounds: Normal breath sounds.  Abdominal:     Tenderness: There is abdominal tenderness. There is no right CVA tenderness or left CVA tenderness.  Musculoskeletal:     Comments: FROM of lumbar spine without pain (-) SLR bil        BP 117/81   Pulse 86   Temp (!) 97.2 F (36.2 C) (Temporal)   Ht 5\' 1"  (1.549 m)   Wt 148 lb (67.1 kg)   LMP 10/16/2014   SpO2 96%   BMI 27.96 kg/m  Urine clear    Assessment & Plan:   Sandra Adams in today with chief complaint of No chief complaint on file.   1. Dysuria (Primary) Urine clear - Urinalysis, Routine w reflex microscopic - Urine Culture  2. Acute midline low back pain without sciatica Moist heat Rest RTO prn - ibuprofen (ADVIL) 800 MG tablet; Take 1 tablet (800 mg total) by mouth every 8 (eight) hours as needed.  Dispense: 30 tablet; Refill: 0    The above assessment and management plan was discussed with the patient. The patient verbalized understanding of and has agreed to the management plan. Patient is aware to call the clinic if symptoms persist or worsen. Patient is aware when to return to the clinic for a follow-up visit. Patient educated on when it is appropriate to go to the emergency department.   Mary-Margaret Daphine Deutscher, FNP

## 2024-03-26 NOTE — Patient Instructions (Signed)
 Acute Back Pain, Adult Acute back pain is sudden and usually short-lived. It is often caused by an injury to the muscles and tissues in the back. The injury may result from: A muscle, tendon, or ligament getting overstretched or torn. Ligaments are tissues that connect bones to each other. Lifting something improperly can cause a back strain. Wear and tear (degeneration) of the spinal disks. Spinal disks are circular tissue that provide cushioning between the bones of the spine (vertebrae). Twisting motions, such as while playing sports or doing yard work. A hit to the back. Arthritis. You may have a physical exam, lab tests, and imaging tests to find the cause of your pain. Acute back pain usually goes away with rest and home care. Follow these instructions at home: Managing pain, stiffness, and swelling Take over-the-counter and prescription medicines only as told by your health care provider. Treatment may include medicines for pain and inflammation that are taken by mouth or applied to the skin, or muscle relaxants. Your health care provider may recommend applying ice during the first 24-48 hours after your pain starts. To do this: Put ice in a plastic bag. Place a towel between your skin and the bag. Leave the ice on for 20 minutes, 2-3 times a day. Remove the ice if your skin turns bright red. This is very important. If you cannot feel pain, heat, or cold, you have a greater risk of damage to the area. If directed, apply heat to the affected area as often as told by your health care provider. Use the heat source that your health care provider recommends, such as a moist heat pack or a heating pad. Place a towel between your skin and the heat source. Leave the heat on for 20-30 minutes. Remove the heat if your skin turns bright red. This is especially important if you are unable to feel pain, heat, or cold. You have a greater risk of getting burned. Activity  Do not stay in bed. Staying in  bed for more than 1-2 days can delay your recovery. Sit up and stand up straight. Avoid leaning forward when you sit or hunching over when you stand. If you work at a desk, sit close to it so you do not need to lean over. Keep your chin tucked in. Keep your neck drawn back, and keep your elbows bent at a 90-degree angle (right angle). Sit high and close to the steering wheel when you drive. Add lower back (lumbar) support to your car seat, if needed. Take short walks on even surfaces as soon as you are able. Try to increase the length of time you walk each day. Do not sit, drive, or stand in one place for more than 30 minutes at a time. Sitting or standing for long periods of time can put stress on your back. Do not drive or use heavy machinery while taking prescription pain medicine. Use proper lifting techniques. When you bend and lift, use positions that put less stress on your back: [place] your knees. Keep the load close to your body. Avoid twisting. Exercise regularly as told by your health care provider. Exercising helps your back heal faster and helps prevent back injuries by keeping muscles strong and flexible. Work with a physical therapist to make a safe exercise program, as recommended by your health care provider. Do any exercises as told by your physical therapist. Lifestyle Maintain a healthy weight. Extra weight puts stress on your back and makes it difficult to have good  posture. Avoid activities or situations that make you feel anxious or stressed. Stress and anxiety increase muscle tension and can make back pain worse. Learn ways to manage anxiety and stress, such as through exercise. General instructions Sleep on a firm mattress in a comfortable position. Try lying on your side with your knees slightly bent. If you lie on your back, put a pillow under your knees. Keep your head and neck in a straight line with your spine (neutral position) when using electronic equipment like  smartphones or pads. To do this: Raise your smartphone or pad to look at it instead of bending your head or neck to look down. Put the smartphone or pad at the level of your face while looking at the screen. Follow your treatment plan as told by your health care provider. This may include: Cognitive or behavioral therapy. Acupuncture or massage therapy. Meditation or yoga. Contact a health care provider if: You have pain that is not relieved with rest or medicine. You have increasing pain going down into your legs or buttocks. Your pain does not improve after 2 weeks. You have pain at night. You lose weight without trying. You have a fever or chills. You develop nausea or vomiting. You develop abdominal pain. Get help right away if: You develop new bowel or bladder control problems. You have unusual weakness or numbness in your arms or legs. You feel faint. These symptoms may represent a serious problem that is an emergency. Do not wait to see if the symptoms will go away. Get medical help right away. Call your local emergency services (911 in the [place] Do not drive yourself to the hospital. Summary Acute back pain is sudden and usually short-lived. Use proper lifting techniques. When you bend and lift, use positions that put less stress on your back. Take over-the-counter and prescription medicines only as told by your health care provider, and apply heat or ice as told. This information is not intended to replace advice given to you by your health care provider. Make sure you discuss any questions you have with your health care provider. Document Revised: 02/27/2021 Document Reviewed: 02/27/2021 Elsevier Patient Education  2024 [employer].

## 2024-03-28 LAB — URINE CULTURE

## 2024-03-29 ENCOUNTER — Ambulatory Visit: Payer: Self-pay

## 2024-03-29 NOTE — Telephone Encounter (Signed)
 Chief Complaint: abdominal pain Symptoms: intermittent severe sharp left sided abdominal, rectal bleeding (2 episodes in the past 2 weeks) Frequency: intermittent x 1 month Pertinent Negatives: Patient denies fever, blood in urine, black tarry stool. Disposition: [x] ED /[] Urgent Care (no appt availability in office) / [] Appointment(In office/virtual)/ []  [facility]/ [] Home Care/ [x] Refused Recommended Disposition /[] [facility] Mobile Bus/ []  Follow-up with PCP Additional Notes: Patient seen on 03/26/24 with Bennie Pierini. She states she did not mention any of these symptoms at the visit. She states she feels a sharp stabbing pain in her left abdomen after eating meals. She states she thought it was a kidney stone last night, it was so severe she thought about calling rescue. Patient states she has been taking ibuprofen. Patient states she has had 2 bloody bowel movements over the past 2 weeks. Advised ED, patient asked if she can go to urgent care. Advised ED for level of care for symptoms. Patient states she does not want to go to ED and would like "Christy to just order some imaging".  Advised patient again to go to ED as she states she thinks she needs care right away and feels this is serious. Called [facility] and notified Chelsea of pt's ED refusal.  Copied from CRM [id number]. Topic: Clinical - Red Word Triage >> Mar 29, 2024  3:15 PM Alessandra Bevels wrote: Red Word that prompted transfer to Nurse Triage: Patient is calling to report sharp hip and Left side stomach in her back pain. Pain is worse at night. Keeps her up at night.    Please advise Reason for Disposition  Blood in bowel movements  (Exception: Blood on surface of BM with constipation.)  Answer Assessment - Initial Assessment Questions 1. LOCATION: "Where does it hurt?"      From left ribs down to left hip.  2. RADIATION: "Does the pain shoot anywhere else?" (e.g., chest, back)     Wraps around left side of  back/flank.  3. ONSET: "When did the pain begin?" (e.g., minutes, hours or days ago)      "X1 month or more"  4. SUDDEN: "Gradual or sudden onset?"     Patient unable to answer, describes pain that comes on after eating or drinking.  5. PATTERN "Does the pain come and go, or is it constant?"    - If it comes and goes: "How long does it last?" "Do you have pain now?"     (Note: Comes and goes means the pain is intermittent. It goes away completely between bouts.)    - If constant: "Is it getting better, staying the same, or getting worse?"      (Note: Constant means the pain never goes away completely; most serious pain is constant and gets worse.)      Comes and goes, lasts about 20 minutes. Starts up when eating or drinking (like a tea or soda). Denies the pain right now.  6. SEVERITY: "How bad is the pain?"  (e.g., Scale 1-10; mild, moderate, or severe)    - MILD (1-3): Doesn't interfere with normal activities, abdomen soft and not tender to touch.     - MODERATE (4-7): Interferes with normal activities or awakens from sleep, abdomen tender to touch.     - SEVERE (8-10): Excruciating pain, doubled over, unable to do any normal activities.       Sharp/stabbing. Denies the pain right now.  7. RECURRENT SYMPTOM: "Have you ever had this type of stomach pain before?" If  Yes, ask: "When was the last time?" and "What happened that time?"      Denies.  8. CAUSE: "What do you think is causing the stomach pain?"     Patient states she is not sure if it is her pancreas or stomach. She states it could be a kidney infection.  9. RELIEVING/AGGRAVATING FACTORS: "What makes it better or worse?" (e.g., antacids, bending or twisting motion, bowel movement)     She states it is worse after eating a meal or drinking soda or tea.   10. OTHER SYMPTOMS: "Do you have any other symptoms?" (e.g., back pain, diarrhea, fever, urination pain, vomiting)       Blood in stool 2 episodes over the past 2 weeks  (bright red blood, about a teaspoon of blood).  11. PREGNANCY: "Is there any chance you are pregnant?" "When was your last menstrual period?"       N/A.  Protocols used: Abdominal Pain - Female-A-AH

## 2024-04-11 ENCOUNTER — Ambulatory Visit: Payer: Self-pay

## 2024-04-11 NOTE — Telephone Encounter (Signed)
 Copied from CRM [id number]. Topic: Clinical - Red Word Triage >> Apr 11, 2024  3:27 PM Zipporah Him wrote: Red Word that prompted transfer to Nurse Triage: Pain in her lower abdominal area, seems to be getting worse possibleUTI   Chief Complaint: Lower Abdominal Pain  Symptoms: Abdominal Pain,   Frequency: Acute, Two days  Pertinent Negatives: Patient denies fever, flank pain,   Disposition: [] ED /[x] Urgent Care (no appt availability in office) / [] Appointment(In office/virtual)/ []  [facility]/ [] Home Care/ [] Refused Recommended Disposition /[]  Mobile Bus/ []  Follow-up with PCP  Additional Notes: DM is being triaged for lower abdominal pain and concentrated urine. The patient states she is very uncomfortable and she cannot wait for an appointment tomorrow. Opted to go to the nearest UC for evaluation and treatment.   Reason for Disposition  Bad or foul-smelling urine  Answer Assessment - Initial Assessment Questions 1. SYMPTOM: "What's the main symptom you're concerned about?" (e.g., frequency, incontinence)     Concentrated Urine, Abdominal Pain  2. ONSET: "When did the  stymptoms  start?"     Two Days  3. PAIN: "Is there any pain?" If Yes, ask: "How bad is it?" (Scale: 1-10; mild, moderate, severe)     Sharp, Lower Abdominal Pain  4. CAUSE: "What do you think is causing the symptoms?"     UTI  5. OTHER SYMPTOMS: "Do you have any other symptoms?" (e.g., blood in urine, fever, flank pain, pain with urination)     No  6. PREGNANCY: "Is there any chance you are pregnant?" "When was your last menstrual period?"     No and NO  Protocols used: Urinary Symptoms-A-AH

## 2024-04-17 ENCOUNTER — Ambulatory Visit: Payer: Self-pay

## 2024-04-17 NOTE — Telephone Encounter (Signed)
 This RN called [facility] and advised them of E.D. disposition and patient stating she was going to Urgent Care

## 2024-04-17 NOTE — Telephone Encounter (Signed)
 Copied from CRM [id number]. Topic: Clinical - Red Word Triage >> Apr 17, 2024 10:01 AM Baldomero Bone wrote: Red Word that prompted transfer to Nurse Triage: lower stomach pain around to back. getting worse. no sleep because of frequent bathroom visits. Patient is also feeling weak off and on. Tested urine-half pad yellow and other half purple. [phone]   Chief Complaint: Weakness/Fatigued Symptoms: weakness, fatigue, urinary frequency, lower abdominal pain Frequency: --- Pertinent Negatives: Patient denies back pain, diarrhea, pain with urination,  Disposition: [] ED /[] Urgent Care (no appt availability in office) / [] Appointment(In office/virtual)/ []  [facility]/ [] Home Care/ [] Refused Recommended Disposition /[]  Mobile Bus/ []  Follow-up with PCP Additional Notes: Patient called and advised that she hasn't felt good for a while. She states that she has felt weak and tired. Then she states that she took a home urine test and it showed half yellow and half beige. Patient states that she had a bowel movement this morning. Patient states that on her left side when she eats she gets a sharp stabbing pain. Patient also was advised 6 days ago to go to an Urgent Care and was advised that she needed to go to Urgent Care to be seen but she states that the Urgent Care was packed that day so she did not wait to be seen. She states that she also cannot get enough sleep. Patient also added later in the conversation that she has had blood in her bowel movements. Patient also states that she thinks she needs a G.I. to look with the light down her intestines and she is worried about something other than a UTI going on. Patient is advised that with her symptoms it is recommended at this time that she goes to the Emergency Room.   Patient states that her friend just pulled up and was ready to take her to the doctor.  Patient states that she is going to go to the Urgent Care at this  time.   Reason for Disposition  Patient sounds very sick or weak to the triager  Answer Assessment - Initial Assessment Questions 1. LOCATION: "Where does it hurt?"      Lower 2. RADIATION: "Does the pain shoot anywhere else?" (e.g., chest, back)     Just stays in lower abdomen 3. ONSET: "When did the pain begin?" (e.g., minutes, hours or days ago)      Going on for a while but worse in the past few days. 4. SUDDEN: "Gradual or sudden onset?"     Seems these symptoms have been going on 5. PATTERN "Does the pain come and go, or is it constant?"    - If it comes and goes: "How long does it last?" "Do you have pain now?"     (Note: Comes and goes means the pain is intermittent. It goes away completely between bouts.)    - If constant: "Is it getting better, staying the same, or getting worse?"      (Note: Constant means the pain never goes away completely; most serious pain is constant and gets worse.)      Comes and goes 6. SEVERITY: "How bad is the pain?"  (e.g., Scale 1-10; mild, moderate, or severe)    - MILD (1-3): Doesn't interfere with normal activities, abdomen soft and not tender to touch.     - MODERATE (4-7): Interferes with normal activities or awakens from sleep, abdomen tender to touch.     - SEVERE (8-10): Excruciating pain, doubled over, unable  to do any normal activities.       3  constantly but a sharp stabbing pain off and on around a 5 out of 10. 7. RECURRENT SYMPTOM: "Have you ever had this type of stomach pain before?" If Yes, ask: "When was the last time?" and "What happened that time?"      ---- 8. CAUSE: "What do you think is causing the stomach pain?"     unsure 9. RELIEVING/AGGRAVATING FACTORS: "What makes it better or worse?" (e.g., antacids, bending or twisting motion, bowel movement)     ---- 10. OTHER SYMPTOMS: "Do you have any other symptoms?" (e.g., back pain, diarrhea, fever, urination pain, vomiting)       Fatigue, pain  Protocols used: Abdominal  Pain - Female-A-AH

## 2024-04-24 ENCOUNTER — Other Ambulatory Visit: Payer: Self-pay | Admitting: Family

## 2024-04-24 DIAGNOSIS — F411 Generalized anxiety disorder: Secondary | ICD-10-CM

## 2024-04-24 DIAGNOSIS — Z79899 Other long term (current) drug therapy: Secondary | ICD-10-CM

## 2024-04-24 DIAGNOSIS — E663 Overweight: Secondary | ICD-10-CM

## 2024-04-27 DIAGNOSIS — K0889 Other specified disorders of teeth and supporting structures: Secondary | ICD-10-CM | POA: Diagnosis not present

## 2024-04-27 DIAGNOSIS — K029 Dental caries, unspecified: Secondary | ICD-10-CM | POA: Diagnosis not present

## 2024-04-30 ENCOUNTER — Ambulatory Visit: Admitting: Urology

## 2024-05-16 DIAGNOSIS — J069 Acute upper respiratory infection, unspecified: Secondary | ICD-10-CM | POA: Diagnosis not present

## 2024-05-16 DIAGNOSIS — H65191 Other acute nonsuppurative otitis media, right ear: Secondary | ICD-10-CM | POA: Diagnosis not present

## 2024-05-29 ENCOUNTER — Telehealth: Admitting: Family

## 2024-05-29 ENCOUNTER — Telehealth: Payer: Self-pay | Admitting: Family Medicine

## 2024-05-29 NOTE — Telephone Encounter (Signed)
Appt made today

## 2024-05-29 NOTE — Telephone Encounter (Signed)
 Copied from CRM [id number]. Topic: Appointments - Scheduling Inquiry for Clinic >> May 29, 2024 11:16 AM Sandra Adams wrote: Reason for CRM: Patient needs a medical release form filled out for a dental appointment she has on 05/31/2024. No appointment available with [facility] until July and patient is already scheduled in July. Please reach out to patient if there are any earlier appointments. Or can the information be faxed without an appointment? Callback number is [phone]

## 2024-05-31 ENCOUNTER — Other Ambulatory Visit: Payer: Self-pay | Admitting: Family

## 2024-05-31 DIAGNOSIS — L814 Other melanin hyperpigmentation: Secondary | ICD-10-CM | POA: Diagnosis not present

## 2024-05-31 DIAGNOSIS — F411 Generalized anxiety disorder: Secondary | ICD-10-CM

## 2024-05-31 DIAGNOSIS — F331 Major depressive disorder, recurrent, moderate: Secondary | ICD-10-CM

## 2024-05-31 DIAGNOSIS — B079 Viral wart, unspecified: Secondary | ICD-10-CM | POA: Diagnosis not present

## 2024-05-31 DIAGNOSIS — D485 Neoplasm of uncertain behavior of skin: Secondary | ICD-10-CM | POA: Diagnosis not present

## 2024-05-31 DIAGNOSIS — L089 Local infection of the skin and subcutaneous tissue, unspecified: Secondary | ICD-10-CM | POA: Diagnosis not present

## 2024-06-13 ENCOUNTER — Ambulatory Visit: Payer: Self-pay

## 2024-06-13 ENCOUNTER — Ambulatory Visit: Payer: Self-pay | Admitting: Nurse Practitioner

## 2024-06-13 ENCOUNTER — Encounter: Payer: Self-pay | Admitting: Nurse Practitioner

## 2024-06-13 ENCOUNTER — Ambulatory Visit: Admitting: Nurse Practitioner

## 2024-06-13 VITALS — BP 129/73 | HR 90 | Ht 61.0 in | Wt 149.0 lb

## 2024-06-13 DIAGNOSIS — N3001 Acute cystitis with hematuria: Secondary | ICD-10-CM | POA: Insufficient documentation

## 2024-06-13 DIAGNOSIS — R399 Unspecified symptoms and signs involving the genitourinary system: Secondary | ICD-10-CM | POA: Diagnosis not present

## 2024-06-13 DIAGNOSIS — E0849 Diabetes mellitus due to underlying condition with other diabetic neurological complication: Secondary | ICD-10-CM | POA: Insufficient documentation

## 2024-06-13 LAB — URINALYSIS, ROUTINE W REFLEX MICROSCOPIC
Bilirubin, UA: NEGATIVE
Glucose, UA: NEGATIVE
Nitrite, UA: NEGATIVE
Protein,UA: NEGATIVE
Specific Gravity, UA: >1.03 — ABNORMAL HIGH (ref 1.005–1.030)
Urobilinogen, Ur: 0.2 mg/dL (ref 0.2–1.0)
pH, UA: 5.5 (ref 5.0–7.5)

## 2024-06-13 LAB — MICROSCOPIC EXAMINATION
Renal Epithel, UA: NONE SEEN /HPF
WBC, UA: >30 /HPF — AB (ref 0–5)

## 2024-06-13 MED ORDER — NITROFURANTOIN MONOHYD MACRO 100 MG PO CAPS: 100.0000 mg | ORAL_CAPSULE | Freq: Two times a day (BID) | ORAL | 0 refills | Status: DC

## 2024-06-13 NOTE — Progress Notes (Signed)
 Acute Office Visit  Subjective:     Patient ID: Sandra Adams, female    DOB: 01-02-1966, 58 y.o.   MRN: [id number]  Chief Complaint  Patient presents with   Urinary Tract Infection    C/o urine odor, with milky film, back pain and pelvic pain   Sandra Adams is a 58 y.o. presents 06/13/2024 fro an acute visit concerns fro UTI Hx of frequent UTI was referred to Urology by PCP last yr never schedule that appointment  I had COVID I couldn't, so I never reschedule.  complains of urinary frequency, urgency and dysuria x 9 days, without flank pain, fever, chills, or abnormal vaginal discharge or bleeding.   Active Ambulatory Problems    Diagnosis Date Noted   Migraine without aura 03/27/2014   Dysmenorrhea 05/20/2014   Menopausal hot flushes 09/16/2014   GERD (gastroesophageal reflux disease) 10/01/2014   GAD (generalized anxiety disorder) 10/01/2014   Depression 10/01/2014   Overweight (BMI 25.0-29.9) 07/02/2016   Chronic back pain 12/31/2016   Controlled substance agreement signed 12/31/2016   Osteopenia 07/21/2017   Osteoarthritis 11/06/2019   Diabetes due to undrl condition w oth diabetic neuro comp (HCC) 06/13/2024   UTI symptoms 06/13/2024   Acute cystitis with hematuria 06/13/2024   Resolved Ambulatory Problems    Diagnosis Date Noted   Yeast vaginitis 09/27/2015   Opioid dependence with opioid-induced disorder (HCC) 12/31/2016   No-show for appointment 01/27/2021   Pruritus 07/29/2021   UTI symptoms 06/28/2023   Acute cystitis with hematuria 06/28/2023   Past Medical History:  Diagnosis Date   Anxiety    Arthritis    Menopausal symptom    Migraine    Scoliosis     ROS Negative unless indicated in HPI    Objective:    BP 129/73   Pulse 90   Ht 5' 1 (1.549 m)   Wt 149 lb (67.6 kg)   LMP 10/16/2014   SpO2 93%   BMI 28.15 kg/m    Physical Exam Vitals and nursing note reviewed.  Constitutional:      General: She is not in acute  distress. HENT:     Head: Normocephalic and atraumatic.     Nose: Nose normal.     Mouth/Throat:     Mouth: Mucous membranes are moist.   Eyes:     Extraocular Movements: Extraocular movements intact.     Conjunctiva/sclera: Conjunctivae normal.     Pupils: Pupils are equal, round, and reactive to light.    Cardiovascular:     Heart sounds: Normal heart sounds.  Pulmonary:     Effort: Pulmonary effort is normal.     Breath sounds: Normal breath sounds.  Abdominal:     Palpations: Abdomen is soft.     Tenderness: There is no right CVA tenderness or left CVA tenderness.   Skin:    General: Skin is warm and dry.     Findings: No rash.   Neurological:     Mental Status: She is alert and oriented to person, place, and time.   Psychiatric:        Mood and Affect: Mood normal.        Behavior: Behavior normal.        Thought Content: Thought content normal.        Judgment: Judgment normal.    Urine dipstick shows positive for RBC's 1+, positive for leukocytes 2+, and positive for ketones trace.   Micro exam: >30 WBC's per HPF, 0-2  RBC's per HPF, moderate+ bacteria, yeast present and epithelia cell 0-10.  No results found for any visits on 06/13/24.      Assessment & Plan:     UTI symptoms -     Urinalysis -     Urine Culture -     Urinalysis, Routine w reflex microscopic -     Nitrofurantoin  Monohyd Macro; Take 1 capsule (100 mg total) by mouth 2 (two) times daily. 1 po BId  Dispense: 20 capsule; Refill: 0 -     Ambulatory referral to Urology  Acute cystitis with hematuria -     Nitrofurantoin  Monohyd Macro; Take 1 capsule (100 mg total) by mouth 2 (two) times daily. 1 po BId  Dispense: 20 capsule; Refill: 0 -     Ambulatory referral to Urology  There was a 58 year old Caucasian female seen today for acute cystitis, no acute distress We will treat with Macrobid  100 mg twice daily for 10 days while waiting for culture result to come back,  due to client having  frequent UTI, will refer her to urology Increase hydration also push fluids, may use Pyridium  OTC prn. Call or return to clinic prn if these symptoms worsen or fail to improve as anticipated.   The above assessment and management plan was discussed with the patient. The patient verbalized understanding of and has agreed to the management plan. Patient is aware to call the clinic if they develop any new symptoms or if symptoms persist or worsen. Patient is aware when to return to the clinic for a follow-up visit. Patient educated on when it is appropriate to go to the emergency department.  Return if symptoms worsen or fail to improve.  Sandra Waldo [place] Thompson, DNP [facility] [address] [place], [place] [address] [phone]  Note: This document was prepared by [employer] voice dictation technology and any errors that results from this process are unintentional.

## 2024-06-13 NOTE — Telephone Encounter (Signed)
 E2C2 scheduled appointment.

## 2024-06-13 NOTE — Telephone Encounter (Signed)
 FYI Only or Action Required?: Action required by provider: request for appointment.  Patient was last seen in primary care on 03/26/2024 by Gladis Mustard, FNP. Called Nurse Triage reporting Dysuria. Symptoms began a week ago. Interventions attempted: Rest, hydration, or home remedies. Symptoms are: gradually worsening.  Triage Disposition: See Physician Within 24 Hours  Patient/caregiver understands and will follow disposition?: YesCopied from CRM [id number]. Topic: Clinical - Red Word Triage >> Jun 13, 2024  9:40 AM Cristopher B wrote: [facility] that prompted transfer to Nurse Triage: blood in urine and headaches , possible UTI Reason for Disposition  Side (flank) or lower back pain present  Answer Assessment - Initial Assessment Questions 1. SYMPTOM: What's the main symptom you're concerned about? (e.g., frequency, incontinence)     Cloudy color 2. ONSET: When did the    start?    Last Tuesday  3. PAIN: Is there any pain? If Yes, ask: How bad is it? (Scale: 1-10; mild, moderate, severe)     7 4. CAUSE: What do you think is causing the symptoms?     Not sure  5. OTHER SYMPTOMS: Do you have any other symptoms? (e.g., blood in urine, fever, flank pain, pain with urination)     Back pain, weakness    Pt is not sure if UTI or yeast infection. Pt has been on abx for dental concerns. Pt also has a headache.  Protocols used: Urinary Symptoms-A-AH

## 2024-06-18 LAB — URINE CULTURE

## 2024-06-18 MED ORDER — FOSFOMYCIN TROMETHAMINE 3 G PO PACK: 3.0000 g | PACK | Freq: Once | ORAL | 0 refills | Status: AC

## 2024-06-20 ENCOUNTER — Other Ambulatory Visit: Payer: Self-pay | Admitting: Family

## 2024-06-20 DIAGNOSIS — K219 Gastro-esophageal reflux disease without esophagitis: Secondary | ICD-10-CM

## 2024-06-26 ENCOUNTER — Ambulatory Visit

## 2024-06-28 ENCOUNTER — Ambulatory Visit: Admitting: Family

## 2024-06-28 ENCOUNTER — Telehealth: Payer: Self-pay

## 2024-06-28 NOTE — Telephone Encounter (Signed)
 No answer patient does not have appointment at this time it had to be rescheduled because E2C2 made appointment wrong had to leave patient a vm.

## 2024-06-28 NOTE — Telephone Encounter (Signed)
 Copied from CRM [id number]. Topic: Clinical - Request for Lab/Test Order >> Jun 28, 2024  9:03 AM Ivette P wrote: Reason for CRM: Pt called in to see if a Urine sample can be done to confirm that UTI has gone away. Pt has appt today 06/28/2024, and would like to do urine sample same time. Pt just finished antibiotic treatment

## 2024-07-03 ENCOUNTER — Ambulatory Visit: Admitting: Family

## 2024-07-03 ENCOUNTER — Ambulatory Visit: Payer: Self-pay | Admitting: Family

## 2024-07-03 ENCOUNTER — Encounter: Payer: Self-pay | Admitting: Family

## 2024-07-03 VITALS — BP 125/77 | HR 90 | Temp 97.6°F | Ht 61.0 in | Wt 149.6 lb

## 2024-07-03 DIAGNOSIS — K219 Gastro-esophageal reflux disease without esophagitis: Secondary | ICD-10-CM

## 2024-07-03 DIAGNOSIS — Z0001 Encounter for general adult medical examination with abnormal findings: Secondary | ICD-10-CM

## 2024-07-03 DIAGNOSIS — G8929 Other chronic pain: Secondary | ICD-10-CM

## 2024-07-03 DIAGNOSIS — Z01818 Encounter for other preprocedural examination: Secondary | ICD-10-CM | POA: Diagnosis not present

## 2024-07-03 DIAGNOSIS — F411 Generalized anxiety disorder: Secondary | ICD-10-CM

## 2024-07-03 DIAGNOSIS — Z87440 Personal history of urinary (tract) infections: Secondary | ICD-10-CM | POA: Diagnosis not present

## 2024-07-03 DIAGNOSIS — M545 Low back pain, unspecified: Secondary | ICD-10-CM

## 2024-07-03 DIAGNOSIS — Z79899 Other long term (current) drug therapy: Secondary | ICD-10-CM

## 2024-07-03 DIAGNOSIS — Z0000 Encounter for general adult medical examination without abnormal findings: Secondary | ICD-10-CM

## 2024-07-03 DIAGNOSIS — E663 Overweight: Secondary | ICD-10-CM

## 2024-07-03 LAB — URINALYSIS, COMPLETE
Bilirubin, UA: NEGATIVE
Glucose, UA: NEGATIVE
Nitrite, UA: NEGATIVE
Protein,UA: NEGATIVE
Specific Gravity, UA: 1.025 (ref 1.005–1.030)
Urobilinogen, Ur: 0.2 mg/dL (ref 0.2–1.0)
pH, UA: 6 (ref 5.0–7.5)

## 2024-07-03 LAB — MICROSCOPIC EXAMINATION
Renal Epithel, UA: NONE SEEN /HPF
Yeast, UA: NONE SEEN

## 2024-07-03 MED ORDER — ALPRAZOLAM 0.25 MG PO TABS: 0.2500 mg | ORAL_TABLET | Freq: Every evening | ORAL | 2 refills | Status: AC | PRN

## 2024-07-03 MED ORDER — IBUPROFEN 600 MG PO TABS
600.0000 mg | ORAL_TABLET | Freq: Three times a day (TID) | ORAL | 0 refills | Status: DC | PRN
Start: 2024-07-03 — End: 2024-09-13

## 2024-07-03 MED ORDER — FAMOTIDINE 20 MG PO TABS: 20.0000 mg | ORAL_TABLET | Freq: Two times a day (BID) | ORAL | 1 refills | Status: AC

## 2024-07-03 NOTE — Patient Instructions (Signed)
 Dental Abscess  A dental abscess is an infection around a tooth that may involve pain, swelling, and a collection of pus, as well as other symptoms. Treatment is important to help with symptoms and to prevent the infection from spreading. The general types of dental abscesses are: Pulpal abscess. This abscess may form from the inner part of the tooth (pulp). Periodontal abscess. This abscess may form from the gum. What are the causes? This condition is caused by a bacterial infection in or around the tooth. It may result from: Severe tooth decay (cavities). Trauma to the tooth, such as a broken or chipped tooth. What increases the risk? This condition is more likely to develop in males. It is also more likely to develop in people who: Have cavities. Have severe gum disease. Eat sugary snacks between meals. Use tobacco products. Have diabetes. Have a weakened disease-fighting system (immune system). Do not brush and care for their teeth regularly. What are the signs or symptoms? Mild symptoms of this condition include: Tenderness. Bad breath. Fever. A bitter taste in the mouth. Pain in and around the infected tooth. Moderate symptoms of this condition include: Swollen neck glands. Chills. Pus drainage. Swelling and redness around the infected tooth, in the mouth, or in the face. Severe pain in and around the infected tooth. Severe symptoms of this condition include: Difficulty swallowing. Difficulty opening the mouth. Nausea. Vomiting. How is this diagnosed? This condition is diagnosed based on: Your symptoms and your medical and dental history. An examination of the infected tooth. During the exam, your dental care provider may tap on the infected tooth. You may also need to have X-rays taken of the affected area. How is this treated? This condition is treated by getting rid of the infection. This may be done with: Antibiotic medicines. These may be used in certain  situations. Antibacterial mouth rinse. Incision and drainage. This procedure is done by making an incision in the abscess to drain out the pus. Removing pus is the first priority in treating an abscess. A root canal. This may be performed to save the tooth. Your dental care provider accesses the visible part of your tooth (crown) with a drill and removes any infected pulp. Then the space is filled and sealed off. Tooth extraction. The tooth is pulled out if it cannot be saved by other treatment. You may also receive treatment for pain, such as: Acetaminophen or NSAIDs. Gels that contain a numbing medicine. An injection to block the pain near your nerve. Follow these instructions at home: Medicines Take over-the-counter and prescription medicines only as told by your dental care provider. If you were prescribed an antibiotic, take it as told by your dental care provider. Do not stop taking the antibiotic even if you start to feel better. If you were prescribed a gel that contains a numbing medicine, use it exactly as told in the directions. Do not use these gels for children who are younger than 80 years of age. Use an antibacterial mouth rinse as told by your dental care provider. General instructions  Gargle with a mixture of salt and water 3-4 times a day or as needed. To make salt water, completely dissolve -1 tsp (3-6 g) of salt in 1 cup (237 mL) of warm water. Eat a soft diet while your abscess is healing. Drink enough fluid to keep your urine pale yellow. Do not apply heat to the outside of your mouth. Do not use any products that contain nicotine or tobacco. These  products include cigarettes, chewing tobacco, and vaping devices, such as e-cigarettes. If you need help quitting, ask your dental care provider. Keep all follow-up visits. This is important. How is this prevented?  Excellent dental home care, which includes brushing your teeth every morning and night with fluoride  toothpaste. Floss one time each day. Get regularly scheduled dental cleanings. Consider having a dental sealant applied on teeth that have deep grooves to prevent cavities. Drink fluoridated water regularly. This includes most tap water. Check the label on bottled water to see if it contains fluoride. Reduce or eliminate sugary drinks. Eat healthy meals and snacks. Wear a mouth guard or face shield to protect your teeth while playing sports. Contact a health care provider if: Your pain is worse and is not helped by medicine. You have swelling. You see pus around the tooth. You have a fever or chills. Get help right away if: Your symptoms suddenly get worse. You have a very bad headache. You have problems breathing or swallowing. You have trouble opening your mouth. You have swelling in your neck or around your eye. These symptoms may represent a serious problem that is an emergency. Do not wait to see if the symptoms will go away. Get medical help right away. Call your local emergency services (911 in the [place] Do not drive yourself to the hospital. Summary A dental abscess is a collection of pus in or around a tooth that results from an infection. A dental abscess may result from severe tooth decay, trauma to the tooth, or severe gum disease around a tooth. Symptoms include severe pain, swelling, redness, and drainage of pus in and around the infected tooth. The first priority in treating a dental abscess is to drain out the pus. Treatment may also involve removing damage inside the tooth (root canal) or extracting the tooth. This information is not intended to replace advice given to you by your health care provider. Make sure you discuss any questions you have with your health care provider. Document Revised: 02/12/2021 Document Reviewed: 02/12/2021 Elsevier Patient Education  2024 [employer].

## 2024-07-03 NOTE — Progress Notes (Signed)
 Subjective:    Patient ID: Sandra Adams, female    DOB: Apr 10, 1966, 58 y.o.   MRN: [id number]  Chief Complaint  Patient presents with   surgical clearance   PT presents to the office today for surgical clearance for upper teeth extraction and lower root canal. She completed antibiotics for dental abscess.   Pt denies any headache, palpitations, SOB, or edema at this time.   Reports she had a UTI last month and completed Macrobid . Denies any dysuria at this time.  Urinary Frequency  This is a recurrent problem. The current episode started 1 to 4 weeks ago. The problem occurs intermittently. The pain is at a severity of 0/10. Associated symptoms include frequency.  Anxiety Presents for follow-up visit. Symptoms include excessive worry, nervous/anxious behavior and restlessness. Symptoms occur occasionally. The severity of symptoms is moderate.    Back Pain This is a chronic problem. The current episode started more than 1 year ago. The problem occurs intermittently. The problem has been waxing and waning since onset. The pain is present in the lumbar spine. The quality of the pain is described as aching. The pain is at a severity of 4/10. The pain is mild. She has tried NSAIDs for the symptoms. The treatment provided moderate relief.  Gastroesophageal Reflux She complains of belching and heartburn. This is a chronic problem. The current episode started more than 1 year ago. The problem occurs occasionally. The symptoms are aggravated by certain foods. She has tried a histamine-2 antagonist for the symptoms.      Review of Systems  Gastrointestinal:  Positive for heartburn.  Genitourinary:  Positive for frequency.  Musculoskeletal:  Positive for back pain.  Psychiatric/Behavioral:  The patient is nervous/anxious.   All other systems reviewed and are negative.   Social History   Socioeconomic History   Marital status: Divorced    Spouse name: Not on file   Number of children:  0   Years of education: 12th   Highest education level: High school graduate  Occupational History   Occupation: [employer]    Comment: Back : [employer]   Tobacco Use   Smoking status: Never   Smokeless tobacco: Never  Vaping Use   Vaping status: Never Used  Substance and Sexual Activity   Alcohol use: No   Drug use: No   Sexual activity: Not Currently    Partners: Male    Birth control/protection: None  Other Topics Concern   Not on file  Social History Narrative   Single, no children. Right handed. 12 th grade. No caffeine.   Divorced since 2004.   Social Drivers of Corporate investment banker Strain: Low Risk  (04/05/2023)   Overall Financial Resource Strain (CARDIA)    Difficulty of Paying Living Expenses: Not hard at all  Food Insecurity: No Food Insecurity (04/05/2023)   Hunger Vital Sign    Worried About Running Out of Food in the Last Year: Never true    Ran Out of Food in the Last Year: Never true  Transportation Needs: No Transportation Needs (04/05/2023)   PRAPARE - Administrator, Civil Service (Medical): No    Lack of Transportation (Non-Medical): No  Physical Activity: Insufficiently Active (04/05/2023)   Exercise Vital Sign    Days of Exercise per Week: 3 days    Minutes of Exercise per Session: 30 min  Stress: No Stress Concern Present (04/05/2023)   [employer] of Occupational Health - Occupational Stress Questionnaire  Feeling of Stress : Not at all  Social Connections: Moderately Isolated (04/05/2023)   Social Connection and Isolation Panel    Frequency of Communication with Friends and Family: More than three times a week    Frequency of Social Gatherings with Friends and Family: More than three times a week    Attends Religious Services: More than 4 times per year    Active Member of [employer] or Organizations: No    Attends Engineer, structural: Never    Marital Status: Divorced   Family History  Problem Relation  Age of Onset   Epilepsy Mother    COPD Father    Hyperlipidemia Father    Heart disease Father    Cancer Father    COPD Sister    Anxiety disorder Sister    COPD Maternal Aunt    Diabetes Maternal Uncle    Diabetes Maternal Aunt        x2   Colon cancer Neg Hx    Colon polyps Neg Hx    Esophageal cancer Neg Hx    Gallbladder disease Neg Hx         Objective:   Physical Exam Vitals reviewed.  Constitutional:      General: She is not in acute distress.    Appearance: She is well-developed.  HENT:     Head: Normocephalic and atraumatic.     Right Ear: Tympanic membrane normal.     Left Ear: Tympanic membrane normal.  Eyes:     Pupils: Pupils are equal, round, and reactive to light.  Neck:     Thyroid : No thyromegaly.  Cardiovascular:     Rate and Rhythm: Normal rate and regular rhythm.     Heart sounds: Normal heart sounds. No murmur heard. Pulmonary:     Effort: Pulmonary effort is normal. No respiratory distress.     Breath sounds: Normal breath sounds. No wheezing.  Abdominal:     General: Bowel sounds are normal. There is no distension.     Palpations: Abdomen is soft.     Tenderness: There is no abdominal tenderness.  Musculoskeletal:        General: No tenderness. Normal range of motion.     Cervical back: Normal range of motion and neck supple.  Skin:    General: Skin is warm and dry.  Neurological:     Mental Status: She is alert and oriented to person, place, and time.     Cranial Nerves: No cranial nerve deficit.     Deep Tendon Reflexes: Reflexes are normal and symmetric.  Psychiatric:        Behavior: Behavior normal.        Thought Content: Thought content normal.        Judgment: Judgment normal.       BP 125/77   Pulse 90   Temp 97.6 F (36.4 C)   Ht 5' 1 (1.549 m)   Wt 149 lb 9.6 oz (67.9 kg)   LMP 10/16/2014   SpO2 96%   BMI 28.27 kg/m      Assessment & Plan:  TALECIA SHERLIN comes in today with chief complaint of surgical  clearance   Diagnosis and orders addressed:  1. Pre-op exam - EKG 12-Lead - CMP14+EGFR - CBC with Differential/Platelet  2. History of UTI - Urinalysis, Complete  3. GAD (generalized anxiety disorder) - ALPRAZolam  (XANAX ) 0.25 MG tablet; Take 1 tablet (0.25 mg total) by mouth at bedtime as needed for anxiety.  Dispense:  20 tablet; Refill: 2  4. Controlled substance agreement signed - ALPRAZolam  (XANAX ) 0.25 MG tablet; Take 1 tablet (0.25 mg total) by mouth at bedtime as needed for anxiety.  Dispense: 20 tablet; Refill: 2  5. Overweight (BMI 25.0-29.9) - ALPRAZolam  (XANAX ) 0.25 MG tablet; Take 1 tablet (0.25 mg total) by mouth at bedtime as needed for anxiety.  Dispense: 20 tablet; Refill: 2  6. Annual physical exam (Primary) - Lipid panel  7. Gastroesophageal reflux disease without esophagitis - famotidine  (PEPCID ) 20 MG tablet; Take 1 tablet (20 mg total) by mouth 2 (two) times daily.  Dispense: 180 tablet; Refill: 1  8. Chronic bilateral low back pain without sciatica - ibuprofen  (ADVIL ) 600 MG tablet; Take 1 tablet (600 mg total) by mouth every 8 (eight) hours as needed.  Dispense: 30 tablet; Refill: 0    Labs pending Will change omeprazole  to Pepcid  -Diet discussed- Avoid fried, spicy, citrus foods, caffeine and alcohol -Do not eat 2-3 hours before bedtime -Encouraged small frequent meals Continue current medications  Keep follow up with specialists  Health Maintenance reviewed Diet and exercise encouraged  No follow-ups on file.    Bari Learn, FNP

## 2024-07-04 LAB — CBC WITH DIFFERENTIAL/PLATELET
Basophils Absolute: 0.1 x10E3/uL (ref 0.0–0.2)
Basos: 1 %
EOS (ABSOLUTE): 0.2 x10E3/uL (ref 0.0–0.4)
Eos: 2 %
Hematocrit: 41.8 % (ref 34.0–46.6)
Hemoglobin: 14 g/dL (ref 11.1–15.9)
Immature Grans (Abs): 0 x10E3/uL (ref 0.0–0.1)
Immature Granulocytes: 0 %
Lymphocytes Absolute: 4 x10E3/uL — ABNORMAL HIGH (ref 0.7–3.1)
Lymphs: 37 %
MCH: 32.5 pg (ref 26.6–33.0)
MCHC: 33.5 g/dL (ref 31.5–35.7)
MCV: 97 fL (ref 79–97)
Monocytes Absolute: 0.9 x10E3/uL (ref 0.1–0.9)
Monocytes: 8 %
Neutrophils Absolute: 5.6 x10E3/uL (ref 1.4–7.0)
Neutrophils: 52 %
Platelets: 212 x10E3/uL (ref 150–450)
RBC: 4.31 x10E6/uL (ref 3.77–5.28)
RDW: 12.2 % (ref 11.7–15.4)
WBC: 10.8 x10E3/uL (ref 3.4–10.8)

## 2024-07-04 LAB — LIPID PANEL
Chol/HDL Ratio: 4.1 ratio (ref 0.0–4.4)
Cholesterol, Total: 181 mg/dL (ref 100–199)
HDL: 44 mg/dL (ref >39)
LDL Chol Calc (NIH): 106 mg/dL — ABNORMAL HIGH (ref 0–99)
Triglycerides: 176 mg/dL — ABNORMAL HIGH (ref 0–149)
VLDL Cholesterol Cal: 31 mg/dL (ref 5–40)

## 2024-07-04 LAB — CMP14+EGFR
ALT: 29 IU/L (ref 0–32)
AST: 21 IU/L (ref 0–40)
Albumin: 4.4 g/dL (ref 3.8–4.9)
Alkaline Phosphatase: 131 IU/L — ABNORMAL HIGH (ref 44–121)
BUN/Creatinine Ratio: 11 (ref 9–23)
BUN: 10 mg/dL (ref 6–24)
Bilirubin Total: 0.4 mg/dL (ref 0.0–1.2)
CO2: 21 mmol/L (ref 20–29)
Calcium: 9.7 mg/dL (ref 8.7–10.2)
Chloride: 103 mmol/L (ref 96–106)
Creatinine, Ser: 0.87 mg/dL (ref 0.57–1.00)
Globulin, Total: 2.7 g/dL (ref 1.5–4.5)
Glucose: 83 mg/dL (ref 70–99)
Potassium: 3.6 mmol/L (ref 3.5–5.2)
Sodium: 142 mmol/L (ref 134–144)
Total Protein: 7.1 g/dL (ref 6.0–8.5)
eGFR: 77 mL/min/1.73 (ref >59)

## 2024-07-04 NOTE — Telephone Encounter (Signed)
 Patient came in for appointment 07/15

## 2024-07-05 LAB — TOXASSURE SELECT 13 (MW), URINE

## 2024-07-11 DIAGNOSIS — Z1231 Encounter for screening mammogram for malignant neoplasm of breast: Secondary | ICD-10-CM | POA: Diagnosis not present

## 2024-07-11 LAB — HM MAMMOGRAPHY

## 2024-07-18 ENCOUNTER — Telehealth: Payer: Self-pay | Admitting: Family

## 2024-07-18 NOTE — Telephone Encounter (Signed)
 [facility] FAXED FORM

## 2024-07-23 ENCOUNTER — Other Ambulatory Visit: Payer: Self-pay | Admitting: Family

## 2024-07-24 DIAGNOSIS — K047 Periapical abscess without sinus: Secondary | ICD-10-CM | POA: Diagnosis not present

## 2024-07-24 NOTE — Telephone Encounter (Signed)
 Form faxed back to [facility]

## 2024-07-26 ENCOUNTER — Encounter: Admitting: Family

## 2024-08-06 ENCOUNTER — Encounter: Payer: Self-pay | Admitting: Physician Assistant

## 2024-08-09 ENCOUNTER — Ambulatory Visit: Admitting: Urology

## 2024-08-13 ENCOUNTER — Ambulatory Visit: Admitting: Urology

## 2024-08-18 DIAGNOSIS — M79674 Pain in right toe(s): Secondary | ICD-10-CM | POA: Diagnosis not present

## 2024-08-18 DIAGNOSIS — R519 Headache, unspecified: Secondary | ICD-10-CM | POA: Diagnosis not present

## 2024-08-18 DIAGNOSIS — S90211A Contusion of right great toe with damage to nail, initial encounter: Secondary | ICD-10-CM | POA: Diagnosis not present

## 2024-08-26 ENCOUNTER — Other Ambulatory Visit: Payer: Self-pay | Admitting: Family

## 2024-08-26 DIAGNOSIS — F411 Generalized anxiety disorder: Secondary | ICD-10-CM

## 2024-08-26 DIAGNOSIS — F331 Major depressive disorder, recurrent, moderate: Secondary | ICD-10-CM

## 2024-09-07 DIAGNOSIS — R1031 Right lower quadrant pain: Secondary | ICD-10-CM | POA: Diagnosis not present

## 2024-09-07 DIAGNOSIS — R300 Dysuria: Secondary | ICD-10-CM | POA: Diagnosis not present

## 2024-09-11 ENCOUNTER — Encounter: Admitting: Family

## 2024-09-11 ENCOUNTER — Ambulatory Visit: Payer: Self-pay | Admitting: Family

## 2024-09-11 ENCOUNTER — Ambulatory Visit: Payer: Self-pay | Admitting: *Deleted

## 2024-09-11 ENCOUNTER — Encounter: Payer: Self-pay | Admitting: Family

## 2024-09-11 ENCOUNTER — Ambulatory Visit (INDEPENDENT_AMBULATORY_CARE_PROVIDER_SITE_OTHER): Admitting: Family

## 2024-09-11 VITALS — BP 135/82 | HR 75 | Temp 98.0°F | Ht 61.0 in | Wt 148.0 lb

## 2024-09-11 DIAGNOSIS — R350 Frequency of micturition: Secondary | ICD-10-CM

## 2024-09-11 DIAGNOSIS — R5383 Other fatigue: Secondary | ICD-10-CM

## 2024-09-11 DIAGNOSIS — R300 Dysuria: Secondary | ICD-10-CM | POA: Diagnosis not present

## 2024-09-11 DIAGNOSIS — K068 Other specified disorders of gingiva and edentulous alveolar ridge: Secondary | ICD-10-CM

## 2024-09-11 DIAGNOSIS — N390 Urinary tract infection, site not specified: Secondary | ICD-10-CM | POA: Diagnosis not present

## 2024-09-11 LAB — URINALYSIS, COMPLETE
Bilirubin, UA: NEGATIVE
Glucose, UA: NEGATIVE
Ketones, UA: NEGATIVE
Nitrite, UA: NEGATIVE
Protein,UA: NEGATIVE
Specific Gravity, UA: 1.02 (ref 1.005–1.030)
Urobilinogen, Ur: 0.2 mg/dL (ref 0.2–1.0)
pH, UA: 6 (ref 5.0–7.5)

## 2024-09-11 LAB — MICROSCOPIC EXAMINATION: Renal Epithel, UA: NONE SEEN /HPF

## 2024-09-11 NOTE — Telephone Encounter (Signed)
 Apt scheduled.

## 2024-09-11 NOTE — Patient Instructions (Signed)

## 2024-09-11 NOTE — Telephone Encounter (Signed)
  FYI Only or Action Required?: FYI only for provider.  Patient was last seen in primary care on 07/03/2024 by Sandra Adams LABOR, FNP.  Called Nurse Triage reporting Fatigue.  Symptoms began several weeks ago.  Interventions attempted: Prescription medications: macrobid  for UTI .  Symptoms are: unchanged.  Triage Disposition: See Physician Within 24 Hours  Patient/caregiver understands and will follow disposition?: Yes   Patient able to be scheduled today with PCP.  Copied from CRM #[id number]. Topic: Clinical - Red Word Triage >> Sep 11, 2024 11:08 AM [name] wrote: Red Word that prompted transfer to Nurse Triage: Patient states she has been experiencing weakness/being severely sick from her stomach from a UTI that she has been dealing with. Patient went to urgent care last Saturday and believes the medication that was given is not strong enough. Wanted orders for urine culture at clinic Reason for Disposition  [1] MODERATE weakness (e.g., interferes with work, school, normal activities) AND [2] persists > 3 days  Answer Assessment - Initial Assessment Questions Appt today , requesting to recheck urine . Reports worsening sx of weakness/ feeling tired, not sleeping.      1. DESCRIPTION: Describe how you are feeling.     Feels weak not sleeping feels sick on stomach taking macrobid  for UTI  2. SEVERITY: How bad is it?  Can you stand and walk?     Can stand and walk but tired  3. ONSET: When did these symptoms begin? (e.g., hours, days, weeks, months)     Last  2 weeks 4. CAUSE: What do you think is causing the weakness or fatigue? (e.g., not drinking enough fluids, medical problem, trouble sleeping)     Not sleeping  5. NEW MEDICINES:  Have you started on any new medicines recently? (e.g., opioid pain medicines, benzodiazepines, muscle relaxants, antidepressants, antihistamines, neuroleptics, beta blockers)     antibiotics 6. OTHER SYMPTOMS: Do you have any other  symptoms? (e.g., chest pain, fever, cough, SOB, vomiting, diarrhea, bleeding, other areas of pain)     Urinary frequency , feels weak not sleeping  7. PREGNANCY: Is there any chance you are pregnant? When was your last menstrual period?     na  Protocols used: Weakness (Generalized) and Fatigue-A-AH

## 2024-09-11 NOTE — Progress Notes (Signed)
 Subjective:    Patient ID: Sandra Adams, female    DOB: 02-06-1966, 58 y.o.   MRN: [id number]  Chief Complaint  Patient presents with   Urinary Frequency   Fatigue   Facial Pain    FROM DENTAK WORK MAKING HER DIZZY AS WELL   PT presents to the office today with multiple complaints.   She had all her teeth removed on 08/16/24. She is having right facial tenderness and pain. Reports aching pain of 6 out 10. She has a follow up with the dentist on 09/13/24.  She is complaining of fatigue. Reports she feels like she has not been drinking enough fluids because of her mouth pain.   Complaining of urinary frequency.  She saw the Urgent Care on 09/07/24 and given macrobid . She is still taking this.  Urinary Frequency  This is a recurrent problem. The current episode started in the past 7 days. The problem occurs intermittently. The problem has been gradually improving. The pain is at a severity of 0/10. The patient is experiencing no pain. Associated symptoms include frequency and urgency. Pertinent negatives include no hematuria, nausea or vomiting. The treatment provided mild relief.      Review of Systems  Gastrointestinal:  Negative for nausea and vomiting.  Genitourinary:  Positive for frequency and urgency. Negative for hematuria.  All other systems reviewed and are negative.   Social History   Socioeconomic History   Marital status: Divorced    Spouse name: Not on file   Number of children: 0   Years of education: 12th   Highest education level: High school graduate  Occupational History   Occupation: [employer]    Comment: Back : [employer]   Tobacco Use   Smoking status: Never   Smokeless tobacco: Never  Vaping Use   Vaping status: Never Used  Substance and Sexual Activity   Alcohol use: No   Drug use: No   Sexual activity: Not Currently    Partners: Male    Birth control/protection: None  Other Topics Concern   Not on file  Social History  Narrative   Single, no children. Right handed. 12 th grade. No caffeine.   Divorced since 2004.   Social Drivers of Corporate investment banker Strain: Low Risk  (04/05/2023)   Overall Financial Resource Strain (CARDIA)    Difficulty of Paying Living Expenses: Not hard at all  Food Insecurity: No Food Insecurity (04/05/2023)   Hunger Vital Sign    Worried About Running Out of Food in the Last Year: Never true    Ran Out of Food in the Last Year: Never true  Transportation Needs: No Transportation Needs (04/05/2023)   PRAPARE - Administrator, Civil Service (Medical): No    Lack of Transportation (Non-Medical): No  Physical Activity: Insufficiently Active (04/05/2023)   Exercise Vital Sign    Days of Exercise per Week: 3 days    Minutes of Exercise per Session: 30 min  Stress: No Stress Concern Present (04/05/2023)   [employer] of Occupational Health - Occupational Stress Questionnaire    Feeling of Stress : Not at all  Social Connections: Moderately Isolated (04/05/2023)   Social Connection and Isolation Panel    Frequency of Communication with Friends and Family: More than three times a week    Frequency of Social Gatherings with Friends and Family: More than three times a week    Attends Religious Services: More than 4 times per year  Active Member of Clubs or Organizations: No    Attends Banker Meetings: Never    Marital Status: Divorced   Family History  Problem Relation Age of Onset   Epilepsy Mother    COPD Father    Hyperlipidemia Father    Heart disease Father    Cancer Father    COPD Sister    Anxiety disorder Sister    COPD Maternal Aunt    Diabetes Maternal Uncle    Diabetes Maternal Aunt        x2   Colon cancer Neg Hx    Colon polyps Neg Hx    Esophageal cancer Neg Hx    Gallbladder disease Neg Hx         Objective:   Physical Exam Vitals reviewed.  Constitutional:      General: She is not in acute distress.     Appearance: She is well-developed.  HENT:     Head: Normocephalic and atraumatic.     Right Ear: Tympanic membrane normal.     Left Ear: Tympanic membrane normal.     Mouth/Throat:     Dentition: Dental tenderness present.     Comments: No teeth present, no erythemas, swelling noted Eyes:     Pupils: Pupils are equal, round, and reactive to light.  Neck:     Thyroid : No thyromegaly.  Cardiovascular:     Rate and Rhythm: Normal rate and regular rhythm.     Heart sounds: Normal heart sounds. No murmur heard. Pulmonary:     Effort: Pulmonary effort is normal. No respiratory distress.     Breath sounds: Normal breath sounds. No wheezing.  Abdominal:     General: Bowel sounds are normal. There is no distension.     Palpations: Abdomen is soft.     Tenderness: There is no abdominal tenderness.  Musculoskeletal:        General: No tenderness. Normal range of motion.     Cervical back: Normal range of motion and neck supple.  Skin:    General: Skin is warm and dry.  Neurological:     Mental Status: She is alert and oriented to person, place, and time.     Cranial Nerves: No cranial nerve deficit.     Deep Tendon Reflexes: Reflexes are normal and symmetric.  Psychiatric:        Behavior: Behavior normal.        Thought Content: Thought content normal.        Judgment: Judgment normal.       BP 135/82   Pulse 75   Temp 98 F (36.7 C) (Temporal)   Ht 5' 1 (1.549 m)   Wt 148 lb (67.1 kg)   LMP 10/16/2014   BMI 27.96 kg/m      Assessment & Plan:  Sandra Adams comes in today with chief complaint of Urinary Frequency, Fatigue, and Facial Pain (FROM DENTAK WORK MAKING HER DIZZY AS WELL)   Diagnosis and orders addressed:  1. Urinary frequency (Primary) - Urinalysis, Complete - Urine Culture - CMP14+EGFR - CBC with Differential/Platelet - Ambulatory referral to Urology  2. Pain in gums - CMP14+EGFR - CBC with Differential/Platelet  3. Other fatigue -  CMP14+EGFR - CBC with Differential/Platelet - TSH  4. Recurrent UTI - Ambulatory referral to Urology   Labs pending Will place referral to Urologists for recurrent UTI Force fluids Continue macrobid  Follow up if symptoms worsen or do not improve    Bari Learn, FNP

## 2024-09-12 LAB — CMP14+EGFR
ALT: 27 IU/L (ref 0–32)
AST: 22 IU/L (ref 0–40)
Albumin: 4.5 g/dL (ref 3.8–4.9)
Alkaline Phosphatase: 141 IU/L — ABNORMAL HIGH (ref 49–135)
BUN/Creatinine Ratio: 11 — ABNORMAL LOW (ref 12–28)
BUN: 10 mg/dL (ref 8–27)
Bilirubin Total: 0.7 mg/dL (ref 0.0–1.2)
CO2: 24 mmol/L (ref 20–29)
Calcium: 9.9 mg/dL (ref 8.7–10.3)
Chloride: 102 mmol/L (ref 96–106)
Creatinine, Ser: 0.88 mg/dL (ref 0.57–1.00)
Globulin, Total: 2.8 g/dL (ref 1.5–4.5)
Glucose: 101 mg/dL — ABNORMAL HIGH (ref 70–99)
Potassium: 4.4 mmol/L (ref 3.5–5.2)
Sodium: 140 mmol/L (ref 134–144)
Total Protein: 7.3 g/dL (ref 6.0–8.5)
eGFR: 75 mL/min/1.73 (ref >59)

## 2024-09-12 LAB — CBC WITH DIFFERENTIAL/PLATELET
Basophils Absolute: 0.1 x10E3/uL (ref 0.0–0.2)
Basos: 1 %
EOS (ABSOLUTE): 0.2 x10E3/uL (ref 0.0–0.4)
Eos: 2 %
Hematocrit: 43.1 % (ref 34.0–46.6)
Hemoglobin: 14.5 g/dL (ref 11.1–15.9)
Immature Grans (Abs): 0 x10E3/uL (ref 0.0–0.1)
Immature Granulocytes: 0 %
Lymphocytes Absolute: 3.9 x10E3/uL — ABNORMAL HIGH (ref 0.7–3.1)
Lymphs: 42 %
MCH: 33 pg (ref 26.6–33.0)
MCHC: 33.6 g/dL (ref 31.5–35.7)
MCV: 98 fL — ABNORMAL HIGH (ref 79–97)
Monocytes Absolute: 0.6 x10E3/uL (ref 0.1–0.9)
Monocytes: 7 %
Neutrophils Absolute: 4.4 x10E3/uL (ref 1.4–7.0)
Neutrophils: 48 %
Platelets: 229 x10E3/uL (ref 150–450)
RBC: 4.39 x10E6/uL (ref 3.77–5.28)
RDW: 12.6 % (ref 11.7–15.4)
WBC: 9.2 x10E3/uL (ref 3.4–10.8)

## 2024-09-12 LAB — TSH: TSH: 1.43 u[IU]/mL (ref 0.450–4.500)

## 2024-09-13 ENCOUNTER — Telehealth: Payer: Self-pay | Admitting: Family Medicine

## 2024-09-13 ENCOUNTER — Other Ambulatory Visit: Payer: Self-pay | Admitting: Family

## 2024-09-13 DIAGNOSIS — H6992 Unspecified Eustachian tube disorder, left ear: Secondary | ICD-10-CM

## 2024-09-13 DIAGNOSIS — G8929 Other chronic pain: Secondary | ICD-10-CM

## 2024-09-13 LAB — URINE CULTURE

## 2024-09-13 MED ORDER — IBUPROFEN 600 MG PO TABS: 600.0000 mg | ORAL_TABLET | Freq: Three times a day (TID) | ORAL | 0 refills | Status: DC | PRN

## 2024-09-13 MED ORDER — PSEUDOEPHEDRINE HCL 30 MG PO TABS: 30.0000 mg | ORAL_TABLET | ORAL | 0 refills | Status: DC | PRN

## 2024-09-13 MED ORDER — FLUTICASONE PROPIONATE 50 MCG/ACT NA SUSP: 2.0000 | Freq: Every day | NASAL | 2 refills | Status: AC

## 2024-09-13 MED ORDER — CETIRIZINE HCL 10 MG PO TABS: 10.0000 mg | ORAL_TABLET | Freq: Every day | ORAL | 1 refills | Status: AC

## 2024-09-13 NOTE — Telephone Encounter (Signed)
 Copied from CRM [id number]. Topic: General - Call Back - No Documentation >> Sep 13, 2024  2:37 PM DeAngela L wrote: Reason for CRM: patient returning missed call from the office  And also the patient is asking if the ibuprofen  (ADVIL ) 600 MG tablet was called in to the pharmacy, patient still having pain after dental surgery    Concerning a medication that was called into the pharmacy and the patient would like to ask if these are the generic brands  cetirizine  (ZYRTEC  ALLERGY) 10 MG tablet fluticasone  (FLONASE ) 50 MCG/ACT nasal spray pseudoephedrine  (SUDAFED) 30 MG tablet  Pt num [phone] (M)

## 2024-09-15 DIAGNOSIS — K120 Recurrent oral aphthae: Secondary | ICD-10-CM | POA: Diagnosis not present

## 2024-09-24 ENCOUNTER — Institutional Professional Consult (permissible substitution) (INDEPENDENT_AMBULATORY_CARE_PROVIDER_SITE_OTHER)

## 2024-09-26 ENCOUNTER — Encounter: Payer: Self-pay | Admitting: Nurse Practitioner

## 2024-09-26 ENCOUNTER — Ambulatory Visit: Payer: Self-pay

## 2024-09-26 ENCOUNTER — Other Ambulatory Visit: Payer: Self-pay | Admitting: Family

## 2024-09-26 ENCOUNTER — Ambulatory Visit: Admitting: Nurse Practitioner

## 2024-09-26 ENCOUNTER — Ambulatory Visit: Admitting: Urology

## 2024-09-26 VITALS — BP 120/66 | HR 81 | Temp 97.6°F | Ht 61.0 in | Wt 151.0 lb

## 2024-09-26 DIAGNOSIS — R300 Dysuria: Secondary | ICD-10-CM | POA: Diagnosis not present

## 2024-09-26 DIAGNOSIS — M545 Low back pain, unspecified: Secondary | ICD-10-CM

## 2024-09-26 LAB — MICROSCOPIC EXAMINATION
Renal Epithel, UA: NONE SEEN /HPF
Yeast, UA: NONE SEEN

## 2024-09-26 LAB — URINALYSIS, COMPLETE
Bilirubin, UA: NEGATIVE
Glucose, UA: NEGATIVE
Ketones, UA: NEGATIVE
Nitrite, UA: NEGATIVE
Protein,UA: NEGATIVE
RBC, UA: NEGATIVE
Specific Gravity, UA: 1.01 (ref 1.005–1.030)
Urobilinogen, Ur: 0.2 mg/dL (ref 0.2–1.0)
pH, UA: 6 (ref 5.0–7.5)

## 2024-09-26 NOTE — Progress Notes (Signed)
 Subjective:  Patient ID: Sandra Adams, female    DOB: 1966-03-16, 58 y.o.   MRN: [id number]  Patient Care Team: Lavell Bari LABOR, FNP as PCP - General (Family Medicine) Edsel Norleen GAILS, MD (Inactive) as Consulting Physician (Obstetrics and Gynecology) Frances Ozell RAMAN, LCSW as [facility] Care Management (Licensed Clinical Social Worker)   Chief Complaint:  Dysuria and Back Pain   HPI: Sandra Adams is a 58 y.o. female presenting on 09/26/2024 for Dysuria and Back Pain   Discussed the use of AI scribe software for clinical note transcription with the patient, who gave verbal consent to proceed.  History of Present Illness Sandra Adams is a 58 year old female who presents with urinary symptoms and flank pain.  She has a history of frequent urinary tract infections and was scheduled to see a urologist today but was unable to attend due to feeling unwell. She has increased urinary frequency, particularly when consuming dark drinks like Pepsi or [facility], and has switched to drinking more water. She recently completed a course of antibiotics prescribed by urgent care for a UTI, confirmed by the presence of leukocytes in her urine.  She describes experiencing a headache since last week and fluctuating symptoms, including 'up and down' bathroom usage. This morning, she experienced significant flank pain, which was alleviated with Tylenol , allowing her to attend the current appointment. No pain with urination but notes a sensation of pressure in the flank area.  She feels tired and weak, and occasionally experiences episodes of feeling overheated, which she attributes to menopause. No fever, nausea, or vomiting. She is not currently sexually active.      Relevant past medical, surgical, family, and social history reviewed and updated as indicated.  Allergies and medications reviewed and updated. Data reviewed: Chart in Epic.   Past Medical History:   Diagnosis Date   Anxiety    Arthritis    Depression    Menopausal symptom    Migraine    Scoliosis     Past Surgical History:  Procedure Laterality Date   KNEE SURGERY Left 1974 and 1979    Social History   Socioeconomic History   Marital status: Divorced    Spouse name: Not on file   Number of children: 0   Years of education: 12th   Highest education level: High school graduate  Occupational History   Occupation: [employer]    Comment: Back : [employer]   Tobacco Use   Smoking status: Never   Smokeless tobacco: Never  Vaping Use   Vaping status: Never Used  Substance and Sexual Activity   Alcohol use: No   Drug use: No   Sexual activity: Not Currently    Partners: Male    Birth control/protection: None  Other Topics Concern   Not on file  Social History Narrative   Single, no children. Right handed. 12 th grade. No caffeine.   Divorced since 2004.   Social Drivers of Corporate investment banker Strain: Low Risk  (04/05/2023)   Overall Financial Resource Strain (CARDIA)    Difficulty of Paying Living Expenses: Not hard at all  Food Insecurity: No Food Insecurity (04/05/2023)   Hunger Vital Sign    Worried About Running Out of Food in the Last Year: Never true    Ran Out of Food in the Last Year: Never true  Transportation Needs: No Transportation Needs (04/05/2023)   PRAPARE - Transportation    Lack  of Transportation (Medical): No    Lack of Transportation (Non-Medical): No  Physical Activity: Insufficiently Active (04/05/2023)   Exercise Vital Sign    Days of Exercise per Week: 3 days    Minutes of Exercise per Session: 30 min  Stress: No Stress Concern Present (04/05/2023)   [employer] of Occupational Health - Occupational Stress Questionnaire    Feeling of Stress : Not at all  Social Connections: Moderately Isolated (04/05/2023)   Social Connection and Isolation Panel    Frequency of Communication with Friends and Family: More than  three times a week    Frequency of Social Gatherings with Friends and Family: More than three times a week    Attends Religious Services: More than 4 times per year    Active Member of [employer] or Organizations: No    Attends Banker Meetings: Never    Marital Status: Divorced  Catering manager Violence: Not At Risk (04/05/2023)   Humiliation, Afraid, Rape, and Kick questionnaire    Fear of Current or Ex-Partner: No    Emotionally Abused: No    Physically Abused: No    Sexually Abused: No    Outpatient Encounter Medications as of 09/26/2024  Medication Sig   ALPRAZolam  (XANAX ) 0.25 MG tablet Take 1 tablet (0.25 mg total) by mouth at bedtime as needed for anxiety.   cetirizine  (ZYRTEC  ALLERGY) 10 MG tablet Take 1 tablet (10 mg total) by mouth daily.   famotidine  (PEPCID ) 20 MG tablet Take 1 tablet (20 mg total) by mouth 2 (two) times daily.   fluticasone  (FLONASE ) 50 MCG/ACT nasal spray Place 2 sprays into both nostrils daily.   ibuprofen  (ADVIL ) 600 MG tablet Take 1 tablet (600 mg total) by mouth every 8 (eight) hours as needed.   pseudoephedrine  (SUDAFED) 30 MG tablet Take 1 tablet (30 mg total) by mouth every 4 (four) hours as needed for congestion.   triamcinolone  cream (KENALOG ) 0.1 % Apply 1 Application topically 3 (three) times daily. Avoid face and genitalia   No facility-administered encounter medications on file as of 09/26/2024.    Allergies  Allergen Reactions   Ciprofloxacin      Makes her feel weak,dizzy   Imitrex  [Sumatriptan ]     Blurred vision    Penicillins    Prednisone  Nausea And Vomiting   Tylenol  [Acetaminophen ]     Tylenol  #3,Headaches   Vicodin [Hydrocodone -Acetaminophen ]     GI upset   Clindamycin Palpitations    It causes my heart to race    Promethazine  Nausea And Vomiting    Pertinent ROS per HPI, otherwise unremarkable      Objective:  BP 120/66   Pulse 81   Temp 97.6 F (36.4 C)   Ht 5' 1 (1.549 m)   Wt 151 lb (68.5 kg)    LMP 10/16/2014   SpO2 99%   BMI 28.53 kg/m    Wt Readings from Last 3 Encounters:  09/26/24 151 lb (68.5 kg)  09/11/24 148 lb (67.1 kg)  07/03/24 149 lb 9.6 oz (67.9 kg)    Physical Exam Vitals and nursing note reviewed.  Constitutional:      General: She is not in acute distress. HENT:     Head: Normocephalic and atraumatic.     Nose: Nose normal.  Eyes:     Extraocular Movements: Extraocular movements intact.     Conjunctiva/sclera: Conjunctivae normal.     Pupils: Pupils are equal, round, and reactive to light.  Cardiovascular:     Heart sounds:  Normal heart sounds.  Pulmonary:     Effort: Pulmonary effort is normal.     Breath sounds: Normal breath sounds.  Abdominal:     General: Bowel sounds are normal.     Palpations: Abdomen is soft.     Tenderness: There is no right CVA tenderness or left CVA tenderness.  Musculoskeletal:        General: Normal range of motion.     Right lower leg: No edema.     Left lower leg: No edema.  Skin:    General: Skin is warm and dry.     Findings: No rash.  Neurological:     Mental Status: She is alert and oriented to person, place, and time.  Psychiatric:        Mood and Affect: Mood normal.        Behavior: Behavior normal.    Physical Exam      Results for orders placed or performed in visit on 09/11/24  Urine Culture   Collection Time: 09/11/24 12:21 PM   Specimen: Urine   UR  Result Value Ref Range   Urine Culture, Routine Final report    Organism ID, Bacteria Lactobacillus species   Microscopic Examination   Collection Time: 09/11/24 12:21 PM   Urine  Result Value Ref Range   WBC, UA 6-10 (A) 0 - 5 /hpf   RBC, Urine 0-2 0 - 2 /hpf   Epithelial Cells (non renal) 0-10 0 - 10 /hpf   Renal Epithel, UA None seen None seen /hpf   Mucus, UA Present (A) Not Estab.   Bacteria, UA Few (A) None seen/Few  Urinalysis, Complete   Collection Time: 09/11/24 12:21 PM  Result Value Ref Range   Specific Gravity, UA 1.020  1.005 - 1.030   pH, UA 6.0 5.0 - 7.5   Color, UA Yellow Yellow   Appearance Ur Clear Clear   Leukocytes,UA Trace (A) Negative   Protein,UA Negative Negative/Trace   Glucose, UA Negative Negative   Ketones, UA Negative Negative   RBC, UA Trace (A) Negative   Bilirubin, UA Negative Negative   Urobilinogen, Ur 0.2 0.2 - 1.0 mg/dL   Nitrite, UA Negative Negative   Microscopic Examination See below:   CMP14+EGFR   Collection Time: 09/11/24 12:55 PM  Result Value Ref Range   Glucose 101 (H) 70 - 99 mg/dL   BUN 10 8 - 27 mg/dL   Creatinine, Ser 9.11 0.57 - 1.00 mg/dL   eGFR 75 >40 fO/fpw/8.26   BUN/Creatinine Ratio 11 (L) 12 - 28   Sodium 140 134 - 144 mmol/L   Potassium 4.4 3.5 - 5.2 mmol/L   Chloride 102 96 - 106 mmol/L   CO2 24 20 - 29 mmol/L   Calcium 9.9 8.7 - 10.3 mg/dL   Total Protein 7.3 6.0 - 8.5 g/dL   Albumin 4.5 3.8 - 4.9 g/dL   Globulin, Total 2.8 1.5 - 4.5 g/dL   Bilirubin Total 0.7 0.0 - 1.2 mg/dL   Alkaline Phosphatase 141 (H) 49 - 135 IU/L   AST 22 0 - 40 IU/L   ALT 27 0 - 32 IU/L  CBC with Differential/Platelet   Collection Time: 09/11/24 12:55 PM  Result Value Ref Range   WBC 9.2 3.4 - 10.8 x10E3/uL   RBC 4.39 3.77 - 5.28 x10E6/uL   Hemoglobin 14.5 11.1 - 15.9 g/dL   Hematocrit 56.8 65.9 - 46.6 %   MCV 98 (H) 79 - 97 fL   MCH  33.0 26.6 - 33.0 pg   MCHC 33.6 31.5 - 35.7 g/dL   RDW 87.3 88.2 - 84.5 %   Platelets 229 150 - 450 x10E3/uL   Neutrophils 48 Not Estab. %   Lymphs 42 Not Estab. %   Monocytes 7 Not Estab. %   Eos 2 Not Estab. %   Basos 1 Not Estab. %   Neutrophils Absolute 4.4 1.4 - 7.0 x10E3/uL   Lymphocytes Absolute 3.9 (H) 0.7 - 3.1 x10E3/uL   Monocytes Absolute 0.6 0.1 - 0.9 x10E3/uL   EOS (ABSOLUTE) 0.2 0.0 - 0.4 x10E3/uL   Basophils Absolute 0.1 0.0 - 0.2 x10E3/uL   Immature Granulocytes 0 Not Estab. %   Immature Grans (Abs) 0.0 0.0 - 0.1 x10E3/uL  TSH   Collection Time: 09/11/24 12:55 PM  Result Value Ref Range   TSH 1.430 0.450 -  4.500 uIU/mL       Pertinent labs & imaging results that were available during my care of the patient were reviewed by me and considered in my medical decision making.  Assessment & Plan:  Gennavieve was seen today for dysuria and back pain.  Diagnoses and all orders for this visit:  Dysuria -     Urinalysis, Complete -     Urine Culture     Assessment and Plan Alexah is a 58 year old Caucasian female seen today for UTI symptoms, no acute distress Assessment & Plan Recurrent urinary tract infections with dysuria Reports frequent UTIs with increased urination frequency, especially after consuming dark drinks. Recently completed antibiotics for a UTI. Overactive bladder diagnosed by urology. Missed today's urology appointment, rescheduled for November 6th. - Analyze urine sample for infection. - Encouraged follow-up with urology on November 6th for further evaluation.    -No antibiotic dispensed today as client just took her last dose of antibiotics yesterday since no culture was done with Macrobid  was prescribed plan to order urine culture and treat the client appropriately when culture comes back - Increase hydration  Continue all other maintenance medications.  Follow up plan: No follow-ups on file.   Continue healthy lifestyle choices, including diet (rich in fruits, vegetables, and lean proteins, and low in salt and simple carbohydrates) and exercise (at least 30 minutes of moderate physical activity daily).  Educational handout given for    Dysuria Dysuria is pain or discomfort when you pee. The pain may be felt in your urethra, which is the part of your body that drains pee (urine) from your bladder. The pain may also be felt near your genitals, groin, or in your lower belly or back. You may have to pee often or have the sudden feeling that you need to pee. This condition can affect anyone, but it's more common in females. It can be caused by: A urinary tract infection  (UTI). Kidney stones or bladder stones. Some sexually transmitted infections (STIs). Dehydration. This is when there's not enough water in your body. Irritation and swelling in the vagina. The use of some medicines. The use of some soaps or products with a scent. Follow these instructions at home: Medicines  Take your medicines only as told. Take your antibiotics as told. Do not stop taking them even if you start to feel better. Eating and drinking Drink enough fluid to keep your pee pale yellow. Certain drinks can make the pain worse. Avoid: Drinks with caffeine in them. Tea. Alcohol. In males, alcohol may irritate the prostate. General instructions Watch your condition for any changes, such as color changes  in your pee. Pee often. Do not hold your pee for a long time. If you're female, wipe from front to back after you pee or poop. Use each tissue only once when you wipe. Pee after you have sex. If you've had any tests done, it's up to you to get your test results. Ask your health care provider, or the department doing the test, when your results will be ready. Contact a health care provider if: You have a fever or chills. You have pain in your back or sides. You throw up or feel like you may throw up. You have blood in your pee. You're not peeing as often as normal. You feel very weak. Get help right away if: You have very bad pain that doesn't get better with medicine. You're confused. You have a fast heartbeat while resting. This information is not intended to replace advice given to you by your health care provider. Make sure you discuss any questions you have with your health care provider. Document Revised: 04/12/2023 Document Reviewed: 04/12/2023 Elsevier Patient Education  2024 Elsevier Inc.    The above assessment and management plan was discussed with the patient. The patient verbalized understanding of and has agreed to the management plan. Patient is aware to  call the clinic if they develop any new symptoms or if symptoms persist or worsen. Patient is aware when to return to the clinic for a follow-up visit. Patient educated on when it is appropriate to go to the emergency department.    Paulo Keimig [place] Thompson, DNP [facility] [address] [place], [place] [address] [phone]

## 2024-09-26 NOTE — Telephone Encounter (Signed)
 Last OV 09/11/2024. Last RF 09/13/24 #30. Next OV not scheduled

## 2024-09-26 NOTE — Telephone Encounter (Signed)
 Pt was seen in office today, will close encounter.

## 2024-09-26 NOTE — Telephone Encounter (Signed)
 FYI Only or Action Required?: Action required by provider: medication refill request.  Patient was last seen in primary care on 09/11/2024 by Lavell Bari LABOR, FNP.  Called Nurse Triage reporting Back Pain.  Symptoms began several days ago.  Interventions attempted: OTC medications: ibuprofen  and Rest, hydration, or home remedies.  Symptoms are: gradually worsening.  Triage Disposition: See HCP Within 4 Hours (Or PCP Triage)  Patient/caregiver understands and will follow disposition?: Yes   Copied from CRM #[id number]. Topic: Clinical - Red Word Triage >> Sep 26, 2024  8:53 AM Sandra Adams wrote: Red Word that prompted transfer to Nurse Triage: Possible UTI - back pain Reason for Disposition  Side (flank) or lower back pain present  Answer Assessment - Initial Assessment Questions Additional info: Requesting refill of ibuprofen  to [facility]. See separate refill encounter.    1. SYMPTOM: What's the main symptom you're concerned about? (e.g., frequency, incontinence)     Dark urine and back pain 2. ONSET: When did the dark urine  and back painstart?     3 days 3. PAIN: Is there any pain? If Yes, ask: How bad is it? (Scale: 1-10; mild, moderate, severe)     Moderate but worsening 4. CAUSE: What do you think is causing the symptoms?     uti 5. OTHER SYMPTOMS: Do you have any other symptoms? (e.g., blood in urine, fever, flank pain, pain with urination)     Urinary frequency and urgency  Protocols used: Urinary Symptoms-A-AH

## 2024-09-27 ENCOUNTER — Ambulatory Visit: Admitting: Physician Assistant

## 2024-09-27 MED ORDER — IBUPROFEN 600 MG PO TABS
600.0000 mg | ORAL_TABLET | Freq: Three times a day (TID) | ORAL | 0 refills | Status: AC | PRN
Start: 2024-09-27 — End: ?

## 2024-09-27 NOTE — Progress Notes (Deleted)
 Chief Complaint: Bloating and upper abdominal pain  HPI:    Sandra Adams is a 58 year old female with a past medical history as listed below including anxiety, depression and multiple others, who was referred to me by Lavell Bari LABOR, FNP for a complaint of bloating and upper abdominal pain.      05/09/2014 patient seen in clinic by Lori Hvozdovic, PA for lower abdominal pain and dysphagia.  Also rectal bleeding.  Given Anusol  suppositories for hemorrhoids.  She did not want a colonoscopy.  Discussed IBS.  Discussed an EGD but she preferred to have an esophagram.    04/27/2021 abdominal ultrasound with fatty liver.    09/11/2024 CBC with MCV minimally elevated 98 otherwise normal.  CMP with a glucose of 101 and alk phos 141 (minimally increased from previous 131 on 7/15)    09/26/2024 patient seen by PCP for frequent urinary tract infections.  Discussed urinary frequency when consuming dark drinks like Pepsi or [facility].  At the time discussed overactive bladder diagnosed by urology.  Urinalysis ordered.  Leukocytes increased culture pending.  Past Medical History:  Diagnosis Date   Anxiety    Arthritis    Depression    Menopausal symptom    Migraine    Scoliosis     Past Surgical History:  Procedure Laterality Date   KNEE SURGERY Left 1974 and 1979    Current Outpatient Medications  Medication Sig Dispense Refill   ALPRAZolam  (XANAX ) 0.25 MG tablet Take 1 tablet (0.25 mg total) by mouth at bedtime as needed for anxiety. 20 tablet 2   cetirizine  (ZYRTEC  ALLERGY) 10 MG tablet Take 1 tablet (10 mg total) by mouth daily. 90 tablet 1   famotidine  (PEPCID ) 20 MG tablet Take 1 tablet (20 mg total) by mouth 2 (two) times daily. 180 tablet 1   fluticasone  (FLONASE ) 50 MCG/ACT nasal spray Place 2 sprays into both nostrils daily. 48 mL 2   ibuprofen  (ADVIL ) 600 MG tablet Take 1 tablet (600 mg total) by mouth every 8 (eight) hours as needed. 30 tablet 0   pseudoephedrine  (SUDAFED) 30 MG tablet  Take 1 tablet (30 mg total) by mouth every 4 (four) hours as needed for congestion. 30 tablet 0   triamcinolone  cream (KENALOG ) 0.1 % Apply 1 Application topically 3 (three) times daily. Avoid face and genitalia 45 g 0   No current facility-administered medications for this visit.    Allergies as of 09/27/2024 - Review Complete 09/26/2024  Allergen Reaction Noted   Ciprofloxacin   10/01/2014   Imitrex  [sumatriptan ]  10/01/2014   Penicillins  12/18/2022   Prednisone  Nausea And Vomiting 06/19/2013   Tylenol  [acetaminophen ]  06/19/2013   Vicodin [hydrocodone -acetaminophen ]  06/19/2013   Clindamycin Palpitations 12/10/2023   Promethazine  Nausea And Vomiting 01/07/2023    Family History  Problem Relation Age of Onset   Epilepsy Mother    COPD Father    Hyperlipidemia Father    Heart disease Father    Cancer Father    COPD Sister    Anxiety disorder Sister    COPD Maternal Aunt    Diabetes Maternal Uncle    Diabetes Maternal Aunt        x2   Colon cancer Neg Hx    Colon polyps Neg Hx    Esophageal cancer Neg Hx    Gallbladder disease Neg Hx     Social History   Socioeconomic History   Marital status: Divorced    Spouse name: Not on file  Number of children: 0   Years of education: 12th   Highest education level: High school graduate  Occupational History   Occupation: [employer]    Comment: Back : [employer]   Tobacco Use   Smoking status: Never   Smokeless tobacco: Never  Vaping Use   Vaping status: Never Used  Substance and Sexual Activity   Alcohol use: No   Drug use: No   Sexual activity: Not Currently    Partners: Male    Birth control/protection: None  Other Topics Concern   Not on file  Social History Narrative   Single, no children. Right handed. 12 th grade. No caffeine.   Divorced since 2004.   Social Drivers of Corporate investment banker Strain: Low Risk  (04/05/2023)   Overall Financial Resource Strain (CARDIA)    Difficulty of  Paying Living Expenses: Not hard at all  Food Insecurity: No Food Insecurity (04/05/2023)   Hunger Vital Sign    Worried About Running Out of Food in the Last Year: Never true    Ran Out of Food in the Last Year: Never true  Transportation Needs: No Transportation Needs (04/05/2023)   PRAPARE - Administrator, Civil Service (Medical): No    Lack of Transportation (Non-Medical): No  Physical Activity: Insufficiently Active (04/05/2023)   Exercise Vital Sign    Days of Exercise per Week: 3 days    Minutes of Exercise per Session: 30 min  Stress: No Stress Concern Present (04/05/2023)   [employer] of Occupational Health - Occupational Stress Questionnaire    Feeling of Stress : Not at all  Social Connections: Moderately Isolated (04/05/2023)   Social Connection and Isolation Panel    Frequency of Communication with Friends and Family: More than three times a week    Frequency of Social Gatherings with Friends and Family: More than three times a week    Attends Religious Services: More than 4 times per year    Active Member of [employer] or Organizations: No    Attends Banker Meetings: Never    Marital Status: Divorced  Catering manager Violence: Not At Risk (04/05/2023)   Humiliation, Afraid, Rape, and Kick questionnaire    Fear of Current or Ex-Partner: No    Emotionally Abused: No    Physically Abused: No    Sexually Abused: No    Review of Systems:    Constitutional: No weight loss, fever, chills, weakness or fatigue HEENT: Eyes: No change in vision               Ears, Nose, Throat:  No change in hearing or congestion Skin: No rash or itching Cardiovascular: No chest pain, chest pressure or palpitations   Respiratory: No SOB or cough Gastrointestinal: See HPI and otherwise negative Genitourinary: No dysuria or change in urinary frequency Neurological: No headache, dizziness or syncope Musculoskeletal: No new muscle or joint pain Hematologic: No bleeding  or bruising Psychiatric: No history of depression or anxiety    Physical Exam:  Vital signs: LMP 10/16/2014   Constitutional:   Pleasant Caucasian female appears to be in NAD, Well developed, Well nourished, alert and cooperative Head:  Normocephalic and atraumatic. Eyes:   PEERL, EOMI. No icterus. Conjunctiva pink. Ears:  Normal auditory acuity. Neck:  Supple Throat: Oral cavity and pharynx without inflammation, swelling or lesion.  Respiratory: Respirations even and unlabored. Lungs clear to auscultation bilaterally.   No wheezes, crackles, or rhonchi.  Cardiovascular: Normal S1, S2.  No MRG. Regular rate and rhythm. No peripheral edema, cyanosis or pallor.  Gastrointestinal:  Soft, nondistended, nontender. No rebound or guarding. Normal bowel sounds. No appreciable masses or hepatomegaly. Rectal:  Not performed.  Msk:  Symmetrical without gross deformities. Without edema, no deformity or joint abnormality.  Neurologic:  Alert and  oriented x4;  grossly normal neurologically.  Skin:   Dry and intact without significant lesions or rashes. Psychiatric: Oriented to person, place and time. Demonstrates good judgement and reason without abnormal affect or behaviors.  RELEVANT LABS AND IMAGING: CBC    Component Value Date/Time   WBC 9.2 09/11/2024 1255   WBC 7.8 03/11/2015 1056   RBC 4.39 09/11/2024 1255   RBC 4.37 03/11/2015 1056   HGB 14.5 09/11/2024 1255   HCT 43.1 09/11/2024 1255   PLT 229 09/11/2024 1255   MCV 98 (H) 09/11/2024 1255   MCH 33.0 09/11/2024 1255   MCH 30.2 03/11/2015 1056   MCHC 33.6 09/11/2024 1255   MCHC 31.4 (A) 03/11/2015 1056   RDW 12.6 09/11/2024 1255   LYMPHSABS 3.9 (H) 09/11/2024 1255   EOSABS 0.2 09/11/2024 1255   BASOSABS 0.1 09/11/2024 1255    CMP     Component Value Date/Time   NA 140 09/11/2024 1255   K 4.4 09/11/2024 1255   CL 102 09/11/2024 1255   CO2 24 09/11/2024 1255   GLUCOSE 101 (H) 09/11/2024 1255   BUN 10 09/11/2024 1255    CREATININE 0.88 09/11/2024 1255   CALCIUM 9.9 09/11/2024 1255   PROT 7.3 09/11/2024 1255   ALBUMIN 4.5 09/11/2024 1255   AST 22 09/11/2024 1255   ALT 27 09/11/2024 1255   ALKPHOS 141 (H) 09/11/2024 1255   BILITOT 0.7 09/11/2024 1255   GFRNONAA 91 07/31/2020 1241   GFRAA 105 07/31/2020 1241    Assessment: 1.  Bloating and upper abdominal pain:  Plan: 1. ***     Sandra Failing, PA-C [facility] 09/27/2024, 12:08 PM  Cc: Lavell Bari LABOR, FNP

## 2024-09-29 LAB — URINE CULTURE

## 2024-10-01 ENCOUNTER — Other Ambulatory Visit: Payer: Self-pay | Admitting: Nurse Practitioner

## 2024-10-01 ENCOUNTER — Ambulatory Visit: Admitting: Nurse Practitioner

## 2024-10-01 ENCOUNTER — Encounter: Payer: Self-pay | Admitting: Nurse Practitioner

## 2024-10-01 ENCOUNTER — Ambulatory Visit: Payer: Self-pay | Admitting: Nurse Practitioner

## 2024-10-01 ENCOUNTER — Ambulatory Visit: Payer: Self-pay | Admitting: Family

## 2024-10-01 VITALS — BP 132/84 | HR 80 | Temp 95.6°F | Ht 61.0 in | Wt 150.6 lb

## 2024-10-01 DIAGNOSIS — R300 Dysuria: Secondary | ICD-10-CM | POA: Diagnosis not present

## 2024-10-01 DIAGNOSIS — R0981 Nasal congestion: Secondary | ICD-10-CM | POA: Insufficient documentation

## 2024-10-01 DIAGNOSIS — N390 Urinary tract infection, site not specified: Secondary | ICD-10-CM | POA: Diagnosis not present

## 2024-10-01 MED ORDER — AZELASTINE HCL 0.1 % NA SOLN: 1.0000 | Freq: Two times a day (BID) | NASAL | 12 refills | Status: AC

## 2024-10-01 MED ORDER — FOSFOMYCIN TROMETHAMINE 3 G PO PACK: 3.0000 g | PACK | Freq: Once | ORAL | 0 refills | Status: AC

## 2024-10-01 NOTE — Telephone Encounter (Signed)
 Appt made.

## 2024-10-01 NOTE — Patient Instructions (Signed)
 Urine culture positive  fro Klebsiella pneumoniae Abnormal

## 2024-10-01 NOTE — Progress Notes (Signed)
 Subjective:  Patient ID: Sandra Adams, female    DOB: [birth year], 58 y.o.   MRN: [id number]  Patient Care Team: Lavell Bari LABOR, FNP as PCP - General (Family Medicine) Edsel Norleen GAILS, MD (Inactive) as Consulting Physician (Obstetrics and Gynecology) Frances Ozell RAMAN, LCSW as [facility] Care Management (Licensed Clinical Social Worker)   Chief Complaint:  Nasal Congestion and Cough (X 1 week )   HPI: Sandra Adams is a 58 y.o. female presenting on 10/01/2024 for Nasal Congestion and Cough (X 1 week )   Discussed the use of AI scribe software for clinical note transcription with the patient, who gave verbal consent to proceed.  History of Present Illness Sandra Adams is a 58 year old female who presents with sinus congestion and difficulty breathing.  She has been experiencing sinus problems for the past four days, characterized by nasal congestion and a sensation of internal swelling, making it difficult for her to breathe. There is no fever, but she has occasional coughing.  She has a history of recurrent urinary tract infections and recently completed a ten-day course of an antibiotic, which she refers to as 'microflexicam'. She is awaiting a urology appointment to investigate the recurrent UTIs. Her urine culture showed the presence of Klebsiella pneumoniae. She reports discomfort when consuming dark drinks but denies frequent urination or burning sensation.  She is not currently taking Zyrtec  for her allergies due to cost, as it is not covered by her insurance. She typically uses Zyrtec  (cetirizine ) for allergies and reports that Sudafed helps with sinus congestion.  In her social history, she avoids dark drinks like Pepsi due to their impact on her urinary symptoms.      Relevant past medical, surgical, family, and social history reviewed and updated as indicated.  Allergies and medications reviewed and updated. Data reviewed: Chart in  Epic.   Past Medical History:  Diagnosis Date   Anxiety    Arthritis    Depression    Menopausal symptom    Migraine    Scoliosis     Past Surgical History:  Procedure Laterality Date   KNEE SURGERY Left 1974 and 1979    Social History   Socioeconomic History   Marital status: Divorced    Spouse name: Not on file   Number of children: 0   Years of education: 12th   Highest education level: High school graduate  Occupational History   Occupation: [employer]    Comment: Back : [employer] Knee surgery   Tobacco Use   Smoking status: Never   Smokeless tobacco: Never  Vaping Use   Vaping status: Never Used  Substance and Sexual Activity   Alcohol use: No   Drug use: No   Sexual activity: Not Currently    Partners: Male    Birth control/protection: None  Other Topics Concern   Not on file  Social History Narrative   Single, no children. Right handed. 12 th grade. No caffeine.   Divorced since 2004.   Social Drivers of Corporate investment banker Strain: Low Risk  (04/05/2023)   Overall Financial Resource Strain (CARDIA)    Difficulty of Paying Living Expenses: Not hard at all  Food Insecurity: No Food Insecurity (04/05/2023)   Hunger Vital Sign    Worried About Running Out of Food in the Last Year: Never true    Ran Out of Food in the Last Year: Never true  Transportation Needs: No Transportation Needs (04/05/2023)  PRAPARE - Administrator, Civil Service (Medical): No    Lack of Transportation (Non-Medical): No  Physical Activity: Insufficiently Active (04/05/2023)   Exercise Vital Sign    Days of Exercise per Week: 3 days    Minutes of Exercise per Session: 30 min  Stress: No Stress Concern Present (04/05/2023)   Sandra [employer] Occupational [employer] - Occupational Stress Questionnaire    Feeling of Stress : Not at all  Social Connections: Moderately Isolated (04/05/2023)   Social Connection and Isolation Panel    Frequency of Communication with  Friends and Family: More than three times a week    Frequency of Social Gatherings with Friends and Family: More than three times a week    Attends Religious Services: More than 4 times per year    Active Member of [employer] or Organizations: No    Attends Banker Meetings: Never    Marital Status: Divorced  Catering manager Violence: Not At Risk (04/05/2023)   Humiliation, Afraid, Rape, and Kick questionnaire    Fear of Current or Ex-Partner: No    Emotionally Abused: No    Physically Abused: No    Sexually Abused: No    Outpatient Encounter Medications as of 10/01/2024  Medication Sig   ALPRAZolam  (XANAX ) 0.25 MG tablet Take 1 tablet (0.25 mg total) by mouth at bedtime as needed for anxiety.   azelastine  (ASTELIN ) 0.1 % nasal spray Place 1 spray into both nostrils 2 (two) times daily. Use in each nostril as directed   famotidine  (PEPCID ) 20 MG tablet Take 1 tablet (20 mg total) by mouth 2 (two) times daily.   fluticasone  (FLONASE ) 50 MCG/ACT nasal spray Place 2 sprays into both nostrils daily.   fosfomycin (MONUROL ) 3 g PACK Take 3 g by mouth once for 1 dose.   ibuprofen  (ADVIL ) 600 MG tablet Take 1 tablet (600 mg total) by mouth every 8 (eight) hours as needed.   triamcinolone  cream (KENALOG ) 0.1 % Apply 1 Application topically 3 (three) times daily. Avoid face and genitalia   cetirizine  (ZYRTEC  ALLERGY) 10 MG tablet Take 1 tablet (10 mg total) by mouth daily. (Patient not taking: Reported on 10/01/2024)   [DISCONTINUED] pseudoephedrine  (SUDAFED) 30 MG tablet Take 1 tablet (30 mg total) by mouth every 4 (four) hours as needed for congestion. (Patient not taking: Reported on 10/01/2024)   No facility-administered encounter medications on file as of 10/01/2024.    Allergies  Allergen Reactions   Ciprofloxacin      Makes her feel weak,dizzy   Imitrex  [Sumatriptan ]     Blurred vision    Penicillins    Prednisone  Nausea And Vomiting   Tylenol  [Acetaminophen ]     Tylenol   #3,Headaches   Vicodin [Hydrocodone -Acetaminophen ]     GI upset   Clindamycin Palpitations    It causes my heart to race    Promethazine  Nausea And Vomiting    Pertinent ROS per HPI, otherwise unremarkable      Objective:  BP 132/84   Pulse 80   Temp (!) 95.6 F (35.3 C)   Ht 5' 1 (1.549 m)   Wt 150 lb 9.6 oz (68.3 kg)   LMP 10/16/2014   SpO2 95%   BMI 28.46 kg/m    Wt Readings from Last 3 Encounters:  10/01/24 150 lb 9.6 oz (68.3 kg)  09/26/24 151 lb (68.5 kg)  09/11/24 148 lb (67.1 kg)    Physical Exam Vitals and nursing note reviewed.  Constitutional:  General: She is not in acute distress. HENT:     Head: Normocephalic and atraumatic.     Nose: Congestion present.  Eyes:     General: No scleral icterus.    Extraocular Movements: Extraocular movements intact.     Conjunctiva/sclera: Conjunctivae normal.     Pupils: Pupils are equal, round, and reactive to light.  Cardiovascular:     Heart sounds: Normal heart sounds.  Pulmonary:     Effort: Pulmonary effort is normal.     Breath sounds: Normal breath sounds.  Musculoskeletal:        General: Normal range of motion.     Right lower leg: No edema.     Left lower leg: No edema.  Skin:    General: Skin is warm and dry.  Neurological:     Mental Status: She is alert and oriented to person, place, and time.  Psychiatric:        Mood and Affect: Mood normal.        Thought Content: Thought content normal.        Judgment: Judgment normal.    Physical Exam      Results for orders placed or performed in visit on 09/26/24  Urine Culture   Collection Time: 09/26/24  1:51 PM   Specimen: Urine   UR  Result Value Ref Range   Urine Culture, Routine Final report (A)    Organism ID, Bacteria Klebsiella pneumoniae (A)    Antimicrobial Susceptibility Comment   Microscopic Examination   Collection Time: 09/26/24  1:51 PM   Urine  Result Value Ref Range   WBC, UA 0-5 0 - 5 /hpf   RBC, Urine 0-2 0 -  2 /hpf   Epithelial Cells (non renal) 0-10 0 - 10 /hpf   Renal Epithel, UA None seen None seen /hpf   Bacteria, UA Few (A) None seen/Few   Yeast, UA None seen None seen  Urinalysis, Complete   Collection Time: 09/26/24  1:51 PM  Result Value Ref Range   Specific Gravity, UA 1.010 1.005 - 1.030   pH, UA 6.0 5.0 - 7.5   Color, UA Yellow Yellow   Appearance Ur Clear Clear   Leukocytes,UA 1+ (A) Negative   Protein,UA Negative Negative/Trace   Glucose, UA Negative Negative   Ketones, UA Negative Negative   RBC, UA Negative Negative   Bilirubin, UA Negative Negative   Urobilinogen, Ur 0.2 0.2 - 1.0 mg/dL   Nitrite, UA Negative Negative   Microscopic Examination See below:        Pertinent labs & imaging results that were available during my care of the patient were reviewed by me and considered in my medical decision making.  Assessment & Plan:  Sandra Adams was seen today for nasal congestion and cough.  Diagnoses and all orders for this visit:  Sinus congestion -     azelastine  (ASTELIN ) 0.1 % nasal spray; Place 1 spray into both nostrils 2 (two) times daily. Use in each nostril as directed  Dysuria -     fosfomycin (MONUROL ) 3 g PACK; Take 3 g by mouth once for 1 dose.  Recurrent UTI -     fosfomycin (MONUROL ) 3 g PACK; Take 3 g by mouth once for 1 dose.     Assessment and Plan Sandra Adams is a 58 year old Caucasian female seen today for sinusitis, no acute distress Assessment & Plan Urinary tract infection due to Klebsiella pneumoniae UTI confirmed by urine culture. Concern about antibiotic resistance due to  recurrent infections. - Prescribed fosfomycin, one dose. - Encouraged hydration. - Advised avoiding dark drinks. - Follow-up with urology for recurrent UTI evaluation.  Acute sinusitis Acute sinusitis with nasal congestion and difficulty breathing. No fever or persistent cough. - Prescribed Astelin  - Prescribed nasal spray for congestion.  Allergic rhinitis Allergic  rhinitis with nasal congestion. Not using Zyrtec  due to cost. - Advised obtaining generic cetirizine  over the counter.      Continue all other maintenance medications.  Follow up plan: No follow-ups on file.   Continue healthy lifestyle choices, including diet (rich in fruits, vegetables, and lean proteins, and low in salt and simple carbohydrates) and exercise (at least 30 minutes of moderate physical activity daily).  Educational handout given for    Clinical References  Dysuria Dysuria is pain or discomfort when you pee. The pain may be felt in your urethra, which is the part of your body that drains pee (urine) from your bladder. The pain may also be felt near your genitals, groin, or in your lower belly or back. You may have to pee often or have the sudden feeling that you need to pee. This condition can affect anyone, but it's more common in females. It can be caused by: A urinary tract infection (UTI). Kidney stones or bladder stones. Some sexually transmitted infections (STIs). Dehydration. This is when there's not enough water in your body. Irritation and swelling in the vagina. The use of some medicines. The use of some soaps or products with a scent. Follow these instructions at home: Medicines  Take your medicines only as told. Take your antibiotics as told. Do not stop taking them even if you start to feel better. Eating and drinking Drink enough fluid to keep your pee pale yellow. Certain drinks can make the pain worse. Avoid: Drinks with caffeine in them. Tea. Alcohol. In males, alcohol may irritate the prostate. General instructions Watch your condition for any changes, such as color changes in your pee. Pee often. Do not hold your pee for a long time. If you're female, wipe from front to back after you pee or poop. Use each tissue only once when you wipe. Pee after you have sex. If you've had any tests done, it's up to you to get your test results. Ask  your health care provider, or the department doing the test, when your results will be ready. Contact a health care provider if: You have a fever or chills. You have pain in your back or sides. You throw up or feel like you may throw up. You have blood in your pee. You're not peeing as often as normal. You feel very weak. Get help right away if: You have very bad pain that doesn't get better with medicine. You're confused. You have a fast heartbeat while resting. This information is not intended to replace advice given to you by your health care provider. Make sure you discuss any questions you have with your health care provider. Document Revised: 04/12/2023 Document Reviewed: 04/12/2023 Elsevier Patient Education  2024 Elsevier Inc. Sinus Infection, Adult A sinus infection is soreness and swelling (inflammation) of your sinuses. Sinuses are hollow spaces in the bones around your face. They are located: Around your eyes. In the middle of your forehead. Behind your nose. In your cheekbones. Your sinuses and nasal passages are lined with a fluid called mucus. Mucus drains out of your sinuses. Swelling can trap mucus in your sinuses. This lets germs (bacteria, virus, or fungus) grow, which leads  to infection. Most of the time, this condition is caused by a virus. What are the causes? Allergies. Asthma. Germs. Things that block your nose or sinuses. Growths in the nose (nasal polyps). Chemicals or irritants in the air. A fungus. This is rare. What increases the risk? Having a weak body defense system (immune system). Doing a lot of swimming or diving. Using nasal sprays too much. Smoking. What are the signs or symptoms? The main symptoms of this condition are pain and a feeling of pressure around the sinuses. Other symptoms include: Stuffy nose (congestion). This may make it hard to breathe through your nose. Runny nose (drainage). Soreness, swelling, and warmth in the  sinuses. A cough that may get worse at night. Being unable to smell and taste. Mucus that collects in the throat or the back of the nose (postnasal drip). This may cause a sore throat or bad breath. Being very tired (fatigued). A fever. How is this diagnosed? Your symptoms. Your medical history. A physical exam. Tests to find out if your condition is short-term (acute) or long-term (chronic). Your doctor may: Check your nose for growths (polyps). Check your sinuses using a tool that has a light on one end (endoscope). Check for allergies or germs. Do imaging tests, such as an MRI or CT scan. How is this treated? Treatment for this condition depends on the cause and whether it is short-term or long-term. If caused by a virus, your symptoms should go away on their own within 10 days. You may be given medicines to relieve symptoms. They include: Medicines that shrink swollen tissue in the nose. A spray that treats swelling of the nostrils. Rinses that help get rid of thick mucus in your nose (nasal saline washes). Medicines that treat allergies (antihistamines). Over-the-counter pain relievers. If caused by bacteria, your doctor may wait to see if you will get better without treatment. You may be given antibiotic medicine if you have: A very bad infection. A weak body defense system. If caused by growths in the nose, surgery may be needed. Follow these instructions at home: Medicines Take, use, or apply over-the-counter and prescription medicines only as told by your doctor. These may include nasal sprays. If you were prescribed an antibiotic medicine, take it as told by your doctor. Do not stop taking it even if you start to feel better. Hydrate and humidify  Drink enough water to keep your pee (urine) pale yellow. Use a cool mist humidifier to keep the humidity level in your home above 50%. Breathe in steam for 10-15 minutes, 3-4 times a day, or as told by your doctor. You can do  this in the bathroom while a hot shower is running. Try not to spend time in cool or dry air. Rest Rest as much as you can. Sleep with your head raised (elevated). Make sure you get enough sleep each night. General instructions  Put a warm, moist washcloth on your face 3-4 times a day, or as often as told by your doctor. Use nasal saline washes as often as told by your doctor. Wash your hands often with soap and water. If you cannot use soap and water, use hand sanitizer. Do not smoke. Avoid being around people who are smoking (secondhand smoke). Keep all follow-up visits. Contact a doctor if: You have a fever. Your symptoms get worse. Your symptoms do not get better within 10 days. Get help right away if: You have a very bad headache. You cannot stop vomiting. You have  very bad pain or swelling around your face or eyes. You have trouble seeing. You feel confused. Your neck is stiff. You have trouble breathing. These symptoms may be an emergency. Get help right away. Call 911. Do not wait to see if the symptoms will go away. Do not drive yourself to the hospital. Summary A sinus infection is swelling of your sinuses. Sinuses are hollow spaces in the bones around your face. This condition is caused by tissues in your nose that become inflamed or swollen. This traps germs. These can lead to infection. If you were prescribed an antibiotic medicine, take it as told by your doctor. Do not stop taking it even if you start to feel better. Keep all follow-up visits. This information is not intended to replace advice given to you by your health care provider. Make sure you discuss any questions you have with your health care provider. Document Revised: 11/10/2021 Document Reviewed: 11/10/2021 Elsevier Patient Education  2024 Elsevier Inc.  The above assessment and management plan was discussed with the patient. The patient verbalized understanding of and has agreed to the management  plan. Patient is aware to call the clinic if they develop any new symptoms or if symptoms persist or worsen. Patient is aware when to return to the clinic for a follow-up visit. Patient educated on when it is appropriate to go to the emergency department.   Kiefer Opheim [place] Thompson, DNP [facility] [address] [place], [place] [address] [phone]

## 2024-10-01 NOTE — Telephone Encounter (Signed)
 FYI Only or Action Required?: FYI only for provider.  Patient was last seen in primary care on 09/26/2024 by Deitra Morton Sebastian Nena, NP.  Called Nurse Triage reporting Sinusitis.  Symptoms began a week ago.  Triage Disposition: No disposition on file.  Patient/caregiver understands and will follow disposition?:         Copied from CRM [id number]. Topic: Clinical - Red Word Triage >> Oct 01, 2024  8:26 AM Merlynn LABOR wrote: Red Word that prompted transfer to Nurse Triage: Possible sinus infection, congested, nose swelling, upper respiratory issues Reason for Disposition  [1] SEVERE sinus pain (e.g., excruciating) AND [2] not improved 2 hours after pain medicine  Answer Assessment - Initial Assessment Questions Pt scheduled for an appointment today in office with a different provider as PCP not available.    Nose feels inflamed; pain in nose (6/10 pain level) Onset: last Mon Mucus is cloudy, dark/light yellow Pt wonders if she has a sinus infection Two rough nights with difficulty sleeping Pt states she had a urine culture and was told she had a UTI; pt was told they were going to send the urine off again to get the right medication but she hasn't heard back in 4 days Pt denies burning with urination Pt states whenever she drinks something she is urinating a lot  Pt wants to find out the results in case she needs to go to a urologist Back pain started yesterday Denies fever  Protocols used: Sinus Pain or Congestion-A-AH

## 2024-10-02 ENCOUNTER — Other Ambulatory Visit: Payer: Self-pay | Admitting: Nurse Practitioner

## 2024-10-02 DIAGNOSIS — N390 Urinary tract infection, site not specified: Secondary | ICD-10-CM

## 2024-10-02 MED ORDER — TETRACYCLINE HCL 500 MG PO CAPS: 500.0000 mg | ORAL_CAPSULE | Freq: Two times a day (BID) | ORAL | 0 refills | Status: DC

## 2024-10-02 MED ORDER — TETRACYCLINE HCL 500 MG PO CAPS: 500.0000 mg | ORAL_CAPSULE | Freq: Four times a day (QID) | ORAL | 0 refills | Status: DC

## 2024-10-02 NOTE — Telephone Encounter (Signed)
 Name from pharmacy: FOSFOMYCIN 3 GM SACHET  Pharmacy comment: Alternative Requested:NOT COVERED BY INSURANCE

## 2024-10-02 NOTE — Progress Notes (Signed)
 She was seen for recurrent UTI based on urine culture fosfomycin was ordered however it is not covered by insurance.-year-old  Changed to tetracycline 500 mg twice daily for 7 days

## 2024-10-09 ENCOUNTER — Telehealth: Payer: Self-pay

## 2024-10-09 NOTE — Telephone Encounter (Signed)
 I called and spoke with patient regarding this. She says the pharmacy gave her to of the same Rx's of the Tetracycline but one said to take 4 tab daily and the other said to take 2 tab daily. I explained to patient that she should disgard the one that says to take 4 tab daily because the prescribing provider Preston) already d/c it. Must have been a typo. But then it was recent with directions to take 2 tab daily which is what patient should be doing. Patient voiced understanding.    Copied from CRM #[id number]. Topic: Clinical - Prescription Issue >> Oct 09, 2024 11:40 AM Sandra Adams wrote: Reason for CRM: tetracycline (SUMYCIN) 500 MG capsule. Pt states that the instructions say take 4 times a day but the rx in the system says to take 2 times a day.

## 2024-10-18 ENCOUNTER — Ambulatory Visit: Payer: Self-pay

## 2024-10-18 ENCOUNTER — Encounter: Payer: Self-pay | Admitting: Nurse Practitioner

## 2024-10-18 ENCOUNTER — Ambulatory Visit (INDEPENDENT_AMBULATORY_CARE_PROVIDER_SITE_OTHER): Admitting: Nurse Practitioner

## 2024-10-18 VITALS — BP 151/77 | HR 91 | Temp 97.5°F | Ht 61.0 in | Wt 151.0 lb

## 2024-10-18 DIAGNOSIS — J029 Acute pharyngitis, unspecified: Secondary | ICD-10-CM | POA: Diagnosis not present

## 2024-10-18 NOTE — Telephone Encounter (Signed)
 Noted

## 2024-10-18 NOTE — Progress Notes (Signed)
   Subjective:    Patient ID: Sandra Adams, female    DOB: 08-19-1966, 58 y.o.   MRN: [id number]   Chief Complaint: scratchy throat   HPI  Patient comes in today c/o scratchy feeling in throat. Started on Monday. Is worse in mornings then throughout day. Voice has been a little hoarse. Uses flonase  daily.  Patient Active Problem List   Diagnosis Date Noted   Sinus congestion 10/01/2024   Recurrent UTI 10/01/2024   Dysuria 09/26/2024   Diabetes due to undrl condition w oth diabetic neuro comp (HCC) 06/13/2024   Osteoarthritis 11/06/2019   Osteopenia 07/21/2017   Chronic back pain 12/31/2016   Controlled substance agreement signed 12/31/2016   Overweight (BMI 25.0-29.9) 07/02/2016   GERD (gastroesophageal reflux disease) 10/01/2014   GAD (generalized anxiety disorder) 10/01/2014   Depression 10/01/2014   Menopausal hot flushes 09/16/2014   Dysmenorrhea 05/20/2014   Migraine without aura 03/27/2014       Review of Systems  Constitutional:  Negative for diaphoresis.  Eyes:  Negative for pain.  Respiratory:  Negative for shortness of breath.   Cardiovascular:  Negative for chest pain, palpitations and leg swelling.  Gastrointestinal:  Negative for abdominal pain.  Endocrine: Negative for polydipsia.  Skin:  Negative for rash.  Neurological:  Negative for dizziness, weakness and headaches.  Hematological:  Does not bruise/bleed easily.  All other systems reviewed and are negative.      Objective:   Physical Exam Constitutional:      Appearance: Normal appearance.  Cardiovascular:     Rate and Rhythm: Normal rate and regular rhythm.     Heart sounds: Normal heart sounds.  Pulmonary:     Effort: Pulmonary effort is normal.     Breath sounds: Normal breath sounds.  Skin:    General: Skin is warm.  Neurological:     General: No focal deficit present.     Mental Status: She is alert and oriented to person, place, and time.  Psychiatric:        Mood and Affect:  Mood normal.        Behavior: Behavior normal.     BP (!) 151/77   Pulse 91   Temp (!) 97.5 F (36.4 C) (Temporal)   Ht 5' 1 (1.549 m)   Wt 151 lb (68.5 kg)   LMP 10/16/2014   SpO2 95%   BMI 28.53 kg/m        Assessment & Plan:   Sandra Adams in today with chief complaint of scratchy throat   1. Acute pharyngitis, unspecified etiology (Primary) Probably due to allergies or dry heat Run humidifier in house  Force fluids Continue flonase  nasal spray Add claritin  or zyrtec  at night     The above assessment and management plan was discussed with the patient. The patient verbalized understanding of and has agreed to the management plan. Patient is aware to call the clinic if symptoms persist or worsen. Patient is aware when to return to the clinic for a follow-up visit. Patient educated on when it is appropriate to go to the emergency department.   Mary-Margaret Gladis, FNP

## 2024-10-18 NOTE — Telephone Encounter (Signed)
 FYI Only or Action Required?: FYI only for provider: appointment scheduled on 10/18/2024 at 10:30 AM.  Patient was last seen in primary care on 10/01/2024 by Deitra Morton Sebastian Nena, NP.  Called Nurse Triage reporting Sore Throat.  Symptoms began Monday.  Interventions attempted: Rest, hydration, or home remedies.  Symptoms are: unchanged.  Triage Disposition: See Physician Within 24 Hours  Patient/caregiver understands and will follow disposition?: Yes  Copied from CRM #[id number]. Topic: Clinical - Red Word Triage >> Oct 18, 2024  8:57 AM Victoria B wrote: [facility] that prompted transfer to Nurse Triage: patient has severe pain in her throat Reason for Disposition  SEVERE throat pain (e.g., excruciating)  Answer Assessment - Initial Assessment Questions Reports severe sore throat since Monday. Endorses sinus congestion and hoarse voice.   1. ONSET: When did the throat start hurting? (Hours or days ago)      Started Monday 2. SEVERITY: How bad is the sore throat? (Scale 1-10; mild, moderate or severe)     severe 3. STREP EXPOSURE: Has there been any exposure to strep within the past week? If Yes, ask: What type of contact occurred?      no 4.  VIRAL SYMPTOMS: Are there any symptoms of a cold, such as a runny nose, cough, hoarse voice or red eyes?      Sinus congestion, hoarse voice 5. FEVER: Do you have a fever? If Yes, ask: What is your temperature, how was it measured, and when did it start?     No measurable temperature but patient reports she feels hot 6. PUS ON THE TONSILS: Is there pus on the tonsils in the back of your throat?     no 7. OTHER SYMPTOMS: Do you have any other symptoms? (e.g., difficulty breathing, headache, rash)     no  Protocols used: Sore Throat-A-AH

## 2024-10-18 NOTE — Patient Instructions (Signed)
 Probably due to allergies or dry heat Run humidifier in house  Force fluids Continue flonase  nasal spray Add claritin  or zyrtec  at night

## 2024-11-01 ENCOUNTER — Ambulatory Visit: Admitting: Family Medicine

## 2024-11-01 VITALS — BP 128/75 | HR 100 | Temp 98.1°F | Ht 61.0 in | Wt 152.6 lb

## 2024-11-01 DIAGNOSIS — J3489 Other specified disorders of nose and nasal sinuses: Secondary | ICD-10-CM

## 2024-11-01 DIAGNOSIS — L089 Local infection of the skin and subcutaneous tissue, unspecified: Secondary | ICD-10-CM

## 2024-11-01 MED ORDER — MUPIROCIN 2 % EX OINT
1.0000 | TOPICAL_OINTMENT | Freq: Two times a day (BID) | CUTANEOUS | 0 refills | Status: AC
Start: 2024-11-01 — End: 2024-11-11

## 2024-11-01 MED ORDER — CEPHALEXIN 500 MG PO CAPS: 500.0000 mg | ORAL_CAPSULE | Freq: Four times a day (QID) | ORAL | 0 refills | Status: AC

## 2024-11-01 NOTE — Patient Instructions (Signed)
 Follow up if symptoms acutely worsen, no improvement over the next 2-3 days, fever develops, or for any other concerns

## 2024-11-01 NOTE — Progress Notes (Signed)
 Acute Office Visit  Patient ID: Sandra Adams, female    DOB: [birth year], 58 y.o.   MRN: [id number]  PCP: Lavell Bari LABOR, FNP  Chief Complaint  Patient presents with   BUMP ON NOSE    Patient reports a bump on her nose for the past few days that hurts and is sore to touch. Also reports redness and swelling. Patient has been using Adapalene 0.1% cream and she says it stings when she puts it on her nose and doesn't really help. Patient also taking Ibuprofen  PRN for pain and is using OTC Neosporen. Patient reports hot/cold compresses doesn't help either.    Subjective:     HPI  Discussed the use of AI scribe software for clinical note transcription with the patient, who gave verbal consent to proceed.  History of Present Illness   Sandra Adams is a 58 year old female here today for concerns about redness and pain to the nose  Nasal pain and erythema - Pain and erythema localized to the left side of the nose, onset 2-3 days ago - Associated with noticeable redness of the affected area - Has applied ice and taken ibuprofen  - Possibly looks a little better today - No fever       ROS     Objective:    BP 128/75   Pulse 100   Temp 98.1 F (36.7 C)   Ht 5' 1 (1.549 m)   Wt 152 lb 9.6 oz (69.2 kg)   LMP 10/16/2014   SpO2 100%   BMI 28.83 kg/m    Physical Exam Vitals reviewed.  Constitutional:      Appearance: Normal appearance.  HENT:     Head: Normocephalic and atraumatic.     Nose:     Comments: Erythema to the tip of nose and L nare. Swelling to the inside of the L nare. No drainage.  Eyes:     Extraocular Movements: Extraocular movements intact.     Conjunctiva/sclera: Conjunctivae normal.     Pupils: Pupils are equal, round, and reactive to light.  Cardiovascular:     Rate and Rhythm: Normal rate and regular rhythm.     Pulses: Normal pulses.     Heart sounds: Normal heart sounds. No murmur heard. Pulmonary:     Effort: Pulmonary effort is normal.  No respiratory distress.     Breath sounds: Normal breath sounds.  Musculoskeletal:        General: Normal range of motion.     Cervical back: Normal range of motion.  Skin:    General: Skin is warm and dry.  Neurological:     General: No focal deficit present.     Mental Status: She is alert and oriented to person, place, and time.  Psychiatric:        Mood and Affect: Mood normal.        Behavior: Behavior normal.       No results found for any visits on 11/01/24.     Assessment & Plan:   Problem List Items Addressed This Visit   None Visit Diagnoses       Nasal vestibulitis    -  Primary   Relevant Medications   mupirocin ointment (BACTROBAN) 2 %   cephALEXin  (KEFLEX ) 500 MG capsule     Skin infection       Relevant Medications   mupirocin ointment (BACTROBAN) 2 %   cephALEXin  (KEFLEX ) 500 MG capsule       Assessment  and Plan    Nasal vestibulitis, left side - Prescribed Bactroban ointment.  - Prescribed Keflex  500 mg orally qid for 7 days. - NSAID as needed for pain - Follow up if symptoms acutely worsen, no improvement over the next 2-3 days, fever develops, or for any other concerns        Meds ordered this encounter  Medications   mupirocin ointment (BACTROBAN) 2 %    Sig: Apply 1 Application topically 2 (two) times daily for 10 days.    Dispense:  30 g    Refill:  0    Supervising Provider:   GOTTSCHALK, ASHLY M [[id number]]   cephALEXin  (KEFLEX ) 500 MG capsule    Sig: Take 1 capsule (500 mg total) by mouth 4 (four) times daily for 7 days.    Dispense:  28 capsule    Refill:  0    Please dispense brand covered by insurance    Supervising Provider:   JOLINDA NORENE HERO [[id number]]    Return if symptoms worsen or fail to improve.  Oneil LELON Severin, FNP [facility]

## 2024-11-14 ENCOUNTER — Encounter: Payer: Self-pay | Admitting: Family Medicine

## 2024-11-14 ENCOUNTER — Ambulatory Visit: Admitting: Family Medicine

## 2024-11-14 ENCOUNTER — Ambulatory Visit: Payer: Self-pay | Admitting: *Deleted

## 2024-11-14 VITALS — BP 126/84 | HR 93 | Temp 97.7°F | Ht 61.0 in | Wt 153.2 lb

## 2024-11-14 DIAGNOSIS — M545 Low back pain, unspecified: Secondary | ICD-10-CM | POA: Diagnosis not present

## 2024-11-14 DIAGNOSIS — N390 Urinary tract infection, site not specified: Secondary | ICD-10-CM | POA: Diagnosis not present

## 2024-11-14 LAB — URINALYSIS, ROUTINE W REFLEX MICROSCOPIC
Bilirubin, UA: NEGATIVE
Glucose, UA: NEGATIVE
Ketones, UA: NEGATIVE
Nitrite, UA: NEGATIVE
Protein,UA: NEGATIVE
Specific Gravity, UA: 1.02 (ref 1.005–1.030)
Urobilinogen, Ur: 0.2 mg/dL (ref 0.2–1.0)
pH, UA: 5.5 (ref 5.0–7.5)

## 2024-11-14 LAB — MICROSCOPIC EXAMINATION
RBC, Urine: NONE SEEN /HPF (ref 0–2)
Renal Epithel, UA: NONE SEEN /HPF
Yeast, UA: NONE SEEN

## 2024-11-14 NOTE — Progress Notes (Signed)
   Acute Office Visit  Subjective:     Patient ID: Sandra Adams, female    DOB: Aug 10, 1966, 58 y.o.   MRN: [id number]  Chief Complaint  Patient presents with   Back Pain    Lower back pain started last night     Back Pain    History of Present Illness   Sandra Adams is a 58 year old female with a history of recurrent UTIs who presents with symptoms suggestive of a urinary tract infection.  Urinary symptoms - fatigue and low back pain associated with possible urinary tract infection - Pink and purple color noted on a UTI strip at home - No dysuria, urgency, hematuria, flank pain, abdominal pain, nausea, vomiting, fever - Intermittent increased urinary frequency, especially after consuming dark drinks or a full bottle of water  Recurrent urinary tract infections - History of recurrent urinary tract infections - Three positive urine cultures earlier this year - History of overactive bladder, noted as common in post-menopausal women - Has appt with urology in January       Review of Systems  Musculoskeletal:  Positive for back pain.   As per HPI.      Objective:    BP 126/84   Pulse 93   Temp 97.7 F (36.5 C) (Temporal)   Ht 5' 1 (1.549 m)   Wt 153 lb 3.2 oz (69.5 kg)   LMP 10/16/2014   SpO2 100%   BMI 28.95 kg/m    Physical Exam Vitals and nursing note reviewed.  Constitutional:      General: She is not in acute distress.    Appearance: She is not ill-appearing, toxic-appearing or diaphoretic.  Pulmonary:     Effort: Pulmonary effort is normal.  Abdominal:     General: Bowel sounds are normal. There is no distension.     Palpations: Abdomen is soft.     Tenderness: There is no abdominal tenderness. There is no right CVA tenderness, left CVA tenderness, guarding or rebound.  Skin:    General: Skin is warm and dry.  Neurological:     Mental Status: She is alert and oriented to person, place, and time. Mental status is at baseline.  Psychiatric:         Mood and Affect: Mood normal.        Behavior: Behavior normal.     Urine dipstick shows positive for RBC's and positive for leukocytes.  Micro exam: 6-10 WBC's per HPF, 0 RBC's per HPF, and few+ bacteria.     Assessment & Plan:   Sandra Adams was seen today for back pain.  Diagnoses and all orders for this visit:  Acute low back pain, unspecified back pain laterality, unspecified whether sciatica present -     Urinalysis, Routine w reflex microscopic -     Urine Culture  Recurrent UTI   Assessment and Plan    Low back pain Intermittent, likely musculoskeletal. No urinary symptom association though she is concerned about a UTI - UA is not compelling for UTI - urine culture is pending  Recurrent UTIs - Keep appt with urology for further evaluation      Return to office for new or worsening symptoms, or if symptoms persist.   Sandra CHRISTELLA Search, FNP

## 2024-11-14 NOTE — Telephone Encounter (Signed)
 FYI Only or Action Required?: FYI only for provider: appointment scheduled on 11/26.  Patient was last seen in primary care on 11/01/2024 by Alcus Oneil ORN, FNP.  Called Nurse Triage reporting Pelvic Pain.  Symptoms began several days ago.  Interventions attempted: Ibuprofen .  Symptoms are: unchanged.  Triage Disposition: See Physician Within 24 Hours  Patient/caregiver understands and will follow disposition?: yes  Copied from CRM #[id number]. Topic: Clinical - Red Word Triage >> Nov 14, 2024  9:31 AM Wess RAMAN wrote: Red Word that prompted transfer to Nurse Triage: frequent UTIs, severe back pain and stomach pain (bottom area).  Would like an appt for today to get urine checked Reason for Disposition  [1] MILD (e.g., does not interfere with normal activities) pelvic pain AND [2] pain comes and goes (cramps) AND [3] present > 48 hours  Answer Assessment - Initial Assessment Questions 1. LOCATION: Where does it hurt?      Flank pain, lower pelvic pain 2. RADIATION: Does the pain shoot anywhere else? (e.g., lower back, groin, thighs)     *No Answer* 3. ONSET: When did the pain begin? (e.g., minutes, hours or days ago)      2 days ago  5. PATTERN Does the pain come and go, or is it constant?     Comes and goes 6. SEVERITY: How bad is the pain?  (e.g., Scale 1-10; mild, moderate, or severe)     Back pain 4/10, pelvis-2/10 7. RECURRENT SYMPTOM: Have you ever had this type of pelvic pain before? If Yes, ask: When was the last time? and What happened that time?      Last time patient had these symptoms aws UTI 8. CAUSE: What do you think is causing the pelvic pain?     Not sure 9. RELIEVING/AGGRAVATING FACTORS: What makes it better or worse? (e.g., activity/rest, sexual intercourse, voiding, passing stool)     ibuprofen  10. OTHER SYMPTOMS: Has there been any other symptoms? (e.g., fever, constipation, diarrhea, urine problems, vaginal bleeding, vaginal  discharge, or vomiting?       Hot yesterday- 99.6  Protocols used: Pelvic Pain - Female-A-AH

## 2024-11-18 LAB — URINE CULTURE

## 2024-11-19 ENCOUNTER — Ambulatory Visit: Payer: Self-pay | Admitting: Family Medicine

## 2024-11-19 ENCOUNTER — Telehealth: Payer: Self-pay | Admitting: Family Medicine

## 2024-11-19 DIAGNOSIS — N3000 Acute cystitis without hematuria: Secondary | ICD-10-CM

## 2024-11-19 MED ORDER — DOXYCYCLINE HYCLATE 100 MG PO TABS: 100.0000 mg | ORAL_TABLET | Freq: Two times a day (BID) | ORAL | 0 refills | Status: AC

## 2024-11-19 NOTE — Telephone Encounter (Signed)
 Patient aware and verbalized understanding.

## 2024-11-19 NOTE — Telephone Encounter (Signed)
 Copied from CRM [id number]. Topic: Clinical - Medication Question >> Nov 19, 2024  9:57 AM Shereese L wrote: Reason for CRM: Patient is requesting a call back from the nurse so that she can be informed on what the medication doxycycline  (VIBRA -TABS) 100 MG tablet is for and why is she taking it

## 2024-11-23 ENCOUNTER — Ambulatory Visit: Payer: Self-pay

## 2024-12-06 ENCOUNTER — Ambulatory Visit: Admitting: Family Medicine

## 2024-12-06 VITALS — BP 138/81 | HR 88 | Temp 97.9°F | Ht 61.0 in | Wt 152.6 lb

## 2024-12-06 DIAGNOSIS — R8271 Bacteriuria: Secondary | ICD-10-CM | POA: Diagnosis not present

## 2024-12-06 DIAGNOSIS — M545 Low back pain, unspecified: Secondary | ICD-10-CM | POA: Diagnosis not present

## 2024-12-06 DIAGNOSIS — N390 Urinary tract infection, site not specified: Secondary | ICD-10-CM

## 2024-12-06 DIAGNOSIS — R82998 Other abnormal findings in urine: Secondary | ICD-10-CM | POA: Diagnosis not present

## 2024-12-06 LAB — URINALYSIS, ROUTINE W REFLEX MICROSCOPIC
Bilirubin, UA: NEGATIVE
Glucose, UA: NEGATIVE
Ketones, UA: NEGATIVE
Nitrite, UA: NEGATIVE
Protein,UA: NEGATIVE
RBC, UA: NEGATIVE
Specific Gravity, UA: >=1.03 — AB (ref 1.005–1.030)
Urobilinogen, Ur: 0.2 mg/dL (ref 0.2–1.0)
pH, UA: 5.5 (ref 5.0–7.5)

## 2024-12-06 LAB — MICROSCOPIC EXAMINATION
RBC, Urine: NONE SEEN /HPF (ref 0–2)
Renal Epithel, UA: NONE SEEN /HPF

## 2024-12-06 MED ORDER — AMOXICILLIN-POT CLAVULANATE 875-125 MG PO TABS: 1.0000 | ORAL_TABLET | Freq: Two times a day (BID) | ORAL | 0 refills | Status: AC

## 2024-12-06 NOTE — Patient Instructions (Signed)
 Follow up if symptoms acutely worsen, no improvement over the next 2-3 days, fever develops, or for any other concerns

## 2024-12-06 NOTE — Progress Notes (Signed)
 Acute Office Visit  Patient ID: Sandra Adams, female    DOB: 1966/04/22, 58 y.o.   MRN: [id number]  PCP: Lavell Bari LABOR, FNP  Chief Complaint  Patient presents with   Dysuria    Subjective:     Dysuria     Discussed the use of AI scribe software for clinical note transcription with the patient, who gave verbal consent to proceed.  History of Present Illness   Sandra Adams is a 58 year old female with hx recurrent urinary tract infections who presents with back pain and suspicion of another UTI.  Back pain - Onset two to three days ago. Patient reports that she did fall on her backside 1.5 weeks ago and is not sure if that is why her back hurts or it it is another UTI.  - Fall occurred backwards onto the floor, landing on buttocks - Denies dysuria. - Denies fever.  - Urinary urgency after drinking fluids.  - Reports feeling fatigued since yesterday.    Recurrent UTI - Has urology appointment on 01/09/25.  - Seen in office on 11/14/24 for low back pain. UA revealed trace leuks and RBC with few bacteria. Urine culture was positive for klebsiella pneumoniae. Started on doxycyline.    Review of Systems  Genitourinary:  Positive for dysuria.       Objective:    BP 138/81   Pulse 88   Temp 97.9 F (36.6 C)   Ht 5' 1 (1.549 m)   Wt 152 lb 9.6 oz (69.2 kg)   LMP 10/16/2014   SpO2 97%   BMI 28.83 kg/m    Physical Exam Vitals reviewed.  Constitutional:      Appearance: Normal appearance.  HENT:     Head: Normocephalic and atraumatic.  Eyes:     Extraocular Movements: Extraocular movements intact.     Conjunctiva/sclera: Conjunctivae normal.     Pupils: Pupils are equal, round, and reactive to light.  Cardiovascular:     Rate and Rhythm: Normal rate and regular rhythm.     Pulses: Normal pulses.     Heart sounds: Normal heart sounds. No murmur heard. Pulmonary:     Effort: Pulmonary effort is normal. No respiratory distress.     Breath sounds:  Normal breath sounds.  Musculoskeletal:        General: Normal range of motion.     Cervical back: Normal range of motion.     Comments: No CVA tenderness  Skin:    General: Skin is warm and dry.  Neurological:     General: No focal deficit present.     Mental Status: She is alert and oriented to person, place, and time.  Psychiatric:        Mood and Affect: Mood normal.        Behavior: Behavior normal.       No results found for any visits on 12/06/24.     Assessment & Plan:   Problem List Items Addressed This Visit       Genitourinary   Recurrent UTI   Relevant Medications   amoxicillin -clavulanate (AUGMENTIN ) 875-125 MG tablet   Other Relevant Orders   Urinalysis, Routine w reflex microscopic   Urine Culture   Other Visit Diagnoses       Urine leukocytes    -  Primary   Relevant Medications   amoxicillin -clavulanate (AUGMENTIN ) 875-125 MG tablet   Other Relevant Orders   Urinalysis, Routine w reflex microscopic   Urine Culture  Bacteria in urine       Relevant Medications   amoxicillin -clavulanate (AUGMENTIN ) 875-125 MG tablet   Other Relevant Orders   Urinalysis, Routine w reflex microscopic   Urine Culture     Low back pain, unspecified back pain laterality, unspecified chronicity, unspecified whether sciatica present       Relevant Medications   amoxicillin -clavulanate (AUGMENTIN ) 875-125 MG tablet   Other Relevant Orders   Urinalysis, Routine w reflex microscopic   Urine Culture       Assessment and Plan    Hx of recurrent urinary tract infection - UA positive for small leuks and few bacteria. Today's UA similar to UA on 11/14/24 that ended up being culture positive for UTI. Patient also having low back pain and fatigue which is similar to previous UTI.  - Prescribed Augmentin  due to previous culture susceptibility, urine culture pending. Chart has penicillin allergy. Patient reports that she has taken amoxicillin  without problem. Discussed  seeking medical care for any allergy symptoms.  - Advised increased water intake. - Continue follow-up with urologist.  - Follow up if symptoms acutely worsen, no improvement over the next 2-3 days, fever develops, or for any other concerns - Has f/u with PCP on 12/11/24.   Discussed that low back pain could be related to fall 1.5 weeks but will treat for possible UTI at this time.          Meds ordered this encounter  Medications   amoxicillin -clavulanate (AUGMENTIN ) 875-125 MG tablet    Sig: Take 1 tablet by mouth 2 (two) times daily for 7 days.    Dispense:  14 tablet    Refill:  0    Supervising Provider:   JOLINDA NORENE HERO [[id number]]    Return if symptoms worsen or fail to improve.  Oneil LELON Severin, FNP [facility]

## 2024-12-08 LAB — URINE CULTURE

## 2024-12-11 ENCOUNTER — Encounter: Admitting: Family

## 2024-12-11 ENCOUNTER — Ambulatory Visit: Payer: Self-pay | Admitting: Family Medicine

## 2024-12-21 ENCOUNTER — Encounter: Payer: Self-pay | Admitting: Family Medicine

## 2024-12-21 ENCOUNTER — Ambulatory Visit: Admitting: Family Medicine

## 2024-12-21 VITALS — BP 128/77 | HR 100 | Temp 97.6°F | Ht 61.0 in | Wt 155.0 lb

## 2024-12-21 DIAGNOSIS — N3281 Overactive bladder: Secondary | ICD-10-CM

## 2024-12-21 DIAGNOSIS — R8290 Unspecified abnormal findings in urine: Secondary | ICD-10-CM | POA: Diagnosis not present

## 2024-12-21 LAB — MICROSCOPIC EXAMINATION
RBC, Urine: NONE SEEN /HPF (ref 0–2)
Renal Epithel, UA: NONE SEEN /HPF
Yeast, UA: NONE SEEN

## 2024-12-21 LAB — URINALYSIS, ROUTINE W REFLEX MICROSCOPIC
Bilirubin, UA: NEGATIVE
Glucose, UA: NEGATIVE
Ketones, UA: NEGATIVE
Nitrite, UA: NEGATIVE
Protein,UA: NEGATIVE
RBC, UA: NEGATIVE
Specific Gravity, UA: 1.025 (ref 1.005–1.030)
Urobilinogen, Ur: 0.2 mg/dL (ref 0.2–1.0)
pH, UA: 5.5 (ref 5.0–7.5)

## 2024-12-21 NOTE — Progress Notes (Signed)
 "  Acute Office Visit  Subjective:     Patient ID: Sandra Adams, female    DOB: 06-Aug-1966, 59 y.o.   MRN: [id number]  Chief Complaint  Patient presents with   Dysuria    HPI  History of Present Illness   Sandra Adams is a 60 year old female with overactive bladder who presents with urinary incontinence and foul-smelling urine.  Urinary incontinence and urgency - Urinary incontinence characterized by urgency and inability to reach the bathroom in time, resulting in episodes of leakage - Bladder control has worsened with age - Frequent urination, especially after consuming caffeinated beverages such as coffee and dark sodas - Considering use of protective undergarments at night due to incontinence  Urinary odor and appearance - Foul odor in urine for the last few days - Presence of 'little white pigments' in urine, associated with diet or fluid intake - Urine culture previously negative for infection  Urinary tract infection concerns - Concern for urinary tract infection due to symptoms - No dysuria, flank pain, fever, chills, or hematuria - Previously advised to discontinue antibiotics after negative urine culture, but restarted antibiotics independently  Bladder symptom management - Uses over-the-counter product Sixtix as needed for bladder symptoms  Family history of bladder issues - Mother experienced similar bladder issues, suggesting possible familial component  Renal calculi concerns - Concern for kidney stones due to changes in urine appearance - Regularly monitors urine for abnormalities       ROS As per HPI.      Objective:    BP 128/77   Pulse 100   Temp 97.6 F (36.4 C) (Temporal)   Ht 5' 1 (1.549 m)   Wt 155 lb (70.3 kg)   LMP 10/16/2014   SpO2 96%   BMI 29.29 kg/m    Physical Exam Vitals and nursing note reviewed.  Constitutional:      General: She is not in acute distress.    Appearance: She is not ill-appearing, toxic-appearing  or diaphoretic.  Pulmonary:     Effort: Pulmonary effort is normal.  Abdominal:     General: Bowel sounds are normal. There is no distension.     Palpations: Abdomen is soft.     Tenderness: There is no abdominal tenderness. There is no right CVA tenderness, left CVA tenderness, guarding or rebound.  Skin:    General: Skin is warm and dry.  Neurological:     Mental Status: She is alert and oriented to person, place, and time. Mental status is at baseline.  Psychiatric:        Mood and Affect: Mood normal.        Behavior: Behavior normal.      Urine dipstick shows positive for leukocytes.  Micro exam: 11-30 WBC's per HPF and few + bacteria.      Assessment & Plan:   Eusebia was seen today for dysuria.  Diagnoses and all orders for this visit:  Abnormal urine odor -     Urinalysis, Routine w reflex microscopic -     Urine Culture  OAB (overactive bladder)   Assessment and Plan    Urine odor UA is not compelling for UTI. Culture is pending. Discussed hydration.  Overactive bladder with urinary incontinence Chronic overactive bladder with urinary incontinence, exacerbated by caffeine intake. No evidence of UTI. Negative previous urine culture. Hesitant to start medication due to side effect concerns. - Sent urine for culture to rule out UTI. - Advised to reduce caffeine intake. -  Recommended wearing overnight underwear or pads. - Continue follow-up with urologist in a couple of weeks.     Return to office for new or worsening symptoms, or if symptoms persist.   The patient indicates understanding of these issues and agrees with the plan.  Sandra CHRISTELLA Search, FNP   "

## 2024-12-24 ENCOUNTER — Ambulatory Visit: Payer: Self-pay | Admitting: Family Medicine

## 2024-12-24 ENCOUNTER — Ambulatory Visit: Payer: Self-pay

## 2024-12-24 DIAGNOSIS — R8279 Other abnormal findings on microbiological examination of urine: Secondary | ICD-10-CM

## 2024-12-24 MED ORDER — AMOXICILLIN-POT CLAVULANATE 875-125 MG PO TABS: 1.0000 | ORAL_TABLET | Freq: Two times a day (BID) | ORAL | 0 refills | Status: AC

## 2024-12-24 NOTE — Telephone Encounter (Signed)
 FYI Only or Action Required?: FYI only for provider: plans to pick up antibiotic and hydrate, plans to go to ED if any worsening of symptoms.  Patient was last seen in primary care on 12/21/2024 by Joesph Annabella HERO, FNP.  Called Nurse Triage reporting Back Pain, Dysuria, and Fatigue.  Symptoms began several days ago.  Interventions attempted: Rest, hydration, or home remedies.  Reported during call not drinking enough water, being intentional now to stay hydrated.   Symptoms are: stable.  Triage Disposition: See Physician Within 24 Hours- no need to be seen currently, will start antibiotic.   Patient/caregiver understands and will follow disposition?: Yes  Copied from CRM [id number]. Topic: Clinical - Lab/Test Results >> Dec 24, 2024  5:20 PM Mercer PEDLAR wrote: Reason for CRM: PMPatient is returning call, I read her the notes verbatum but patient had questions.   Reason for Disposition  Bad or foul-smelling urine    Has already been seen.  Additional Information  Negative: Shock suspected (e.g., cold/pale/clammy skin, too weak to stand, low BP, rapid pulse)    Able to stand, not dizzy or light-headed.  Negative: [1] Taking antibiotic for urinary tract infection (UTI) AND [2] female    Has not yet started antibiotic.  Negative: [1] Decreased urination and [2] drinking very little AND [3] dehydration suspected (e.g., dark urine, no urine > 12 hours, very dry mouth, very lightheaded)    Able to hydrate during call and feeling better.  Negative: Patient sounds very sick or weak to the triager    Symptoms improve during call.  Patient also reports some anxiety related to recurrent UTIs and living alone.  Voices feeling better by end of call.  Negative: Side (flank) or lower back pain present    Has already been seen and treatment ordered by provider for positive urine culture. Lower back pain improving by end of call.  Answer Assessment - Initial Assessment Questions Patient was seen in  clinic and received message today of need for antibiotic (culture positive).  This Triage RN makes patient aware Augmentin  has been sent to [facility] as provider noted positive urine culture.  Patient reports history of UTIs and foul smelling urine.  Patient volunteers other information prompting need for triage, was last seen in clinic a few days ago (12/21/24).  Agreeable to further assessment, symptoms improve during call:   Initially reports having chills and cold at night, feeling weak and shaking.  Having a weird feeling.  Sometimes gets to having head spinning and doesn't want to be by self.   Thought saw something around the corner but looked again and it wasn't there.  Uncertain if this was a hallucination versus dry eye as patient reports.  When looking intentionally, did not see anything.   Intially reports having stabbing pains in lower back that sometimes shoots up.  Using heat for pain.  Also having urinary frequency/ urgency.  Has been really tired/fatigued.    Is currently with boyfriend at his house during call.  During call reports not drinking enough water therefore actively hydrates while speaking with RN.  Upon further assessment reports able to get up and walk around and pain not as bad now to low back.  Able to get into vehicle during call to go pick up antibiotic.  Denies any dizziness or light-headedness. RN does audible HR assessment (patient makes noise for each heartbeat palpated) and sounds within normal range.  Protocols used: Urinary Symptoms-A-AH

## 2024-12-25 LAB — URINE CULTURE

## 2024-12-25 NOTE — Telephone Encounter (Signed)
Fyi noted.

## 2024-12-26 ENCOUNTER — Telehealth: Payer: Self-pay

## 2024-12-26 NOTE — Telephone Encounter (Signed)
 Return call to patient to confirm appointment time and date. Voiced Understanding

## 2025-01-09 ENCOUNTER — Ambulatory Visit: Admitting: Urology

## 2025-01-14 ENCOUNTER — Encounter: Admitting: Family

## 2025-01-31 ENCOUNTER — Encounter: Admitting: Family
